# Patient Record
Sex: Female | Born: 1955 | Race: White | Hispanic: No | Marital: Married | Smoking: Current every day smoker
Health system: Southern US, Community
[De-identification: ages and names within clinical notes are randomized; demographics above are authoritative.]

## PROBLEM LIST (undated history)

## (undated) DIAGNOSIS — M79606 Pain in leg, unspecified: Secondary | ICD-10-CM

## (undated) DIAGNOSIS — Z8659 Personal history of other mental and behavioral disorders: Secondary | ICD-10-CM

## (undated) DIAGNOSIS — I739 Peripheral vascular disease, unspecified: Secondary | ICD-10-CM

## (undated) DIAGNOSIS — IMO0002 Reserved for concepts with insufficient information to code with codable children: Secondary | ICD-10-CM

## (undated) DIAGNOSIS — F32A Depression, unspecified: Secondary | ICD-10-CM

## (undated) DIAGNOSIS — M5430 Sciatica, unspecified side: Secondary | ICD-10-CM

## (undated) DIAGNOSIS — M26609 Unspecified temporomandibular joint disorder, unspecified side: Secondary | ICD-10-CM

## (undated) DIAGNOSIS — E039 Hypothyroidism, unspecified: Secondary | ICD-10-CM

## (undated) DIAGNOSIS — E079 Disorder of thyroid, unspecified: Secondary | ICD-10-CM

## (undated) DIAGNOSIS — I639 Cerebral infarction, unspecified: Secondary | ICD-10-CM

## (undated) DIAGNOSIS — J40 Bronchitis, not specified as acute or chronic: Secondary | ICD-10-CM

## (undated) DIAGNOSIS — K219 Gastro-esophageal reflux disease without esophagitis: Secondary | ICD-10-CM

## (undated) DIAGNOSIS — R0600 Dyspnea, unspecified: Secondary | ICD-10-CM

## (undated) DIAGNOSIS — I10 Essential (primary) hypertension: Secondary | ICD-10-CM

## (undated) DIAGNOSIS — F329 Major depressive disorder, single episode, unspecified: Secondary | ICD-10-CM

## (undated) DIAGNOSIS — M797 Fibromyalgia: Secondary | ICD-10-CM

## (undated) HISTORY — DX: Personal history of other mental and behavioral disorders: Z86.59

## (undated) HISTORY — DX: Peripheral vascular disease, unspecified: I73.9

## (undated) HISTORY — PX: OTHER SURGICAL HISTORY: SHX169

## (undated) HISTORY — DX: Reserved for concepts with insufficient information to code with codable children: IMO0002

## (undated) HISTORY — PX: ABDOMINAL HYSTERECTOMY: SHX81

## (undated) HISTORY — DX: Unspecified temporomandibular joint disorder, unspecified side: M26.609

## (undated) HISTORY — PX: OVARIAN CYST SURGERY: SHX726

## (undated) HISTORY — PX: TUBAL LIGATION: SHX77

## (undated) HISTORY — DX: Sciatica, unspecified side: M54.30

## (undated) HISTORY — DX: Pain in leg, unspecified: M79.606

## (undated) HISTORY — DX: Bronchitis, not specified as acute or chronic: J40

## (undated) HISTORY — PX: MANDIBLE FRACTURE SURGERY: SHX706

## (undated) HISTORY — DX: Disorder of thyroid, unspecified: E07.9

## (undated) HISTORY — DX: Fibromyalgia: M79.7

## (undated) HISTORY — DX: Essential (primary) hypertension: I10

---

## 1980-10-24 HISTORY — PX: COLON SURGERY: SHX602

## 1991-08-24 DIAGNOSIS — M26609 Unspecified temporomandibular joint disorder, unspecified side: Secondary | ICD-10-CM

## 1991-08-24 HISTORY — DX: Unspecified temporomandibular joint disorder, unspecified side: M26.609

## 1997-01-22 HISTORY — PX: CHOLECYSTECTOMY: SHX55

## 2000-01-31 ENCOUNTER — Encounter: Payer: Self-pay | Admitting: Physical Medicine and Rehabilitation

## 2000-01-31 ENCOUNTER — Ambulatory Visit (HOSPITAL_COMMUNITY)
Admission: RE | Admit: 2000-01-31 | Discharge: 2000-01-31 | Payer: Self-pay | Admitting: Physical Medicine and Rehabilitation

## 2000-02-29 ENCOUNTER — Ambulatory Visit (HOSPITAL_COMMUNITY)
Admission: RE | Admit: 2000-02-29 | Discharge: 2000-02-29 | Payer: Self-pay | Admitting: Physical Medicine and Rehabilitation

## 2000-02-29 ENCOUNTER — Encounter: Payer: Self-pay | Admitting: Physical Medicine and Rehabilitation

## 2000-03-08 ENCOUNTER — Encounter: Admission: RE | Admit: 2000-03-08 | Discharge: 2000-03-08 | Payer: Self-pay | Admitting: Oral Surgery

## 2000-03-08 ENCOUNTER — Encounter: Payer: Self-pay | Admitting: Oral Surgery

## 2000-03-09 ENCOUNTER — Ambulatory Visit (HOSPITAL_BASED_OUTPATIENT_CLINIC_OR_DEPARTMENT_OTHER): Admission: RE | Admit: 2000-03-09 | Discharge: 2000-03-09 | Payer: Self-pay | Admitting: Oral Surgery

## 2001-06-22 ENCOUNTER — Ambulatory Visit (HOSPITAL_COMMUNITY): Admission: RE | Admit: 2001-06-22 | Discharge: 2001-06-22 | Payer: Self-pay | Admitting: Internal Medicine

## 2005-11-04 ENCOUNTER — Ambulatory Visit: Payer: Self-pay | Admitting: Internal Medicine

## 2005-11-04 ENCOUNTER — Observation Stay (HOSPITAL_COMMUNITY): Admission: EM | Admit: 2005-11-04 | Discharge: 2005-11-05 | Payer: Self-pay | Admitting: *Deleted

## 2005-11-04 ENCOUNTER — Ambulatory Visit: Payer: Self-pay | Admitting: Cardiology

## 2005-11-18 ENCOUNTER — Ambulatory Visit: Payer: Self-pay | Admitting: Cardiology

## 2005-11-29 ENCOUNTER — Ambulatory Visit: Payer: Self-pay | Admitting: Cardiology

## 2005-12-01 ENCOUNTER — Inpatient Hospital Stay (HOSPITAL_BASED_OUTPATIENT_CLINIC_OR_DEPARTMENT_OTHER): Admission: RE | Admit: 2005-12-01 | Discharge: 2005-12-01 | Payer: Self-pay | Admitting: Cardiology

## 2005-12-01 ENCOUNTER — Ambulatory Visit: Payer: Self-pay | Admitting: Cardiology

## 2005-12-09 ENCOUNTER — Ambulatory Visit: Payer: Self-pay | Admitting: Cardiology

## 2005-12-16 ENCOUNTER — Ambulatory Visit: Admission: RE | Admit: 2005-12-16 | Discharge: 2005-12-16 | Payer: Self-pay | Admitting: *Deleted

## 2005-12-17 ENCOUNTER — Emergency Department (HOSPITAL_COMMUNITY): Admission: EM | Admit: 2005-12-17 | Discharge: 2005-12-17 | Payer: Self-pay | Admitting: Emergency Medicine

## 2005-12-18 ENCOUNTER — Emergency Department (HOSPITAL_COMMUNITY): Admission: EM | Admit: 2005-12-18 | Discharge: 2005-12-18 | Payer: Self-pay | Admitting: Emergency Medicine

## 2005-12-25 ENCOUNTER — Emergency Department (HOSPITAL_COMMUNITY): Admission: EM | Admit: 2005-12-25 | Discharge: 2005-12-25 | Payer: Self-pay | Admitting: Emergency Medicine

## 2006-11-26 ENCOUNTER — Emergency Department (HOSPITAL_COMMUNITY): Admission: EM | Admit: 2006-11-26 | Discharge: 2006-11-26 | Payer: Self-pay | Admitting: Emergency Medicine

## 2007-11-19 ENCOUNTER — Emergency Department (HOSPITAL_COMMUNITY): Admission: EM | Admit: 2007-11-19 | Discharge: 2007-11-19 | Payer: Self-pay | Admitting: Emergency Medicine

## 2007-12-17 ENCOUNTER — Emergency Department (HOSPITAL_COMMUNITY): Admission: EM | Admit: 2007-12-17 | Discharge: 2007-12-17 | Payer: Self-pay | Admitting: Emergency Medicine

## 2008-03-05 ENCOUNTER — Emergency Department (HOSPITAL_COMMUNITY): Admission: EM | Admit: 2008-03-05 | Discharge: 2008-03-05 | Payer: Self-pay | Admitting: Emergency Medicine

## 2008-04-19 ENCOUNTER — Emergency Department (HOSPITAL_COMMUNITY): Admission: EM | Admit: 2008-04-19 | Discharge: 2008-04-19 | Payer: Self-pay | Admitting: Emergency Medicine

## 2008-05-02 ENCOUNTER — Emergency Department (HOSPITAL_COMMUNITY): Admission: EM | Admit: 2008-05-02 | Discharge: 2008-05-02 | Payer: Self-pay | Admitting: Emergency Medicine

## 2008-10-24 HISTORY — PX: COLONOSCOPY WITH ESOPHAGOGASTRODUODENOSCOPY (EGD): SHX5779

## 2008-12-05 ENCOUNTER — Emergency Department (HOSPITAL_COMMUNITY): Admission: EM | Admit: 2008-12-05 | Discharge: 2008-12-05 | Payer: Self-pay | Admitting: Emergency Medicine

## 2008-12-06 ENCOUNTER — Emergency Department (HOSPITAL_COMMUNITY): Admission: EM | Admit: 2008-12-06 | Discharge: 2008-12-07 | Payer: Self-pay | Admitting: Emergency Medicine

## 2009-02-09 ENCOUNTER — Emergency Department (HOSPITAL_COMMUNITY): Admission: EM | Admit: 2009-02-09 | Discharge: 2009-02-09 | Payer: Self-pay | Admitting: Emergency Medicine

## 2009-02-11 ENCOUNTER — Emergency Department (HOSPITAL_COMMUNITY): Admission: EM | Admit: 2009-02-11 | Discharge: 2009-02-11 | Payer: Self-pay | Admitting: Emergency Medicine

## 2009-06-06 ENCOUNTER — Emergency Department (HOSPITAL_COMMUNITY): Admission: EM | Admit: 2009-06-06 | Discharge: 2009-06-07 | Payer: Self-pay | Admitting: Emergency Medicine

## 2009-06-20 ENCOUNTER — Emergency Department (HOSPITAL_COMMUNITY): Admission: EM | Admit: 2009-06-20 | Discharge: 2009-06-20 | Payer: Self-pay | Admitting: Emergency Medicine

## 2009-07-01 DIAGNOSIS — Z8719 Personal history of other diseases of the digestive system: Secondary | ICD-10-CM | POA: Insufficient documentation

## 2009-07-02 ENCOUNTER — Telehealth (INDEPENDENT_AMBULATORY_CARE_PROVIDER_SITE_OTHER): Payer: Self-pay

## 2009-07-02 ENCOUNTER — Ambulatory Visit: Payer: Self-pay | Admitting: Internal Medicine

## 2009-07-02 DIAGNOSIS — R109 Unspecified abdominal pain: Secondary | ICD-10-CM | POA: Insufficient documentation

## 2009-07-02 DIAGNOSIS — R634 Abnormal weight loss: Secondary | ICD-10-CM

## 2009-07-03 ENCOUNTER — Encounter: Payer: Self-pay | Admitting: Internal Medicine

## 2009-07-03 ENCOUNTER — Telehealth (INDEPENDENT_AMBULATORY_CARE_PROVIDER_SITE_OTHER): Payer: Self-pay | Admitting: *Deleted

## 2009-07-06 ENCOUNTER — Ambulatory Visit (HOSPITAL_COMMUNITY): Admission: RE | Admit: 2009-07-06 | Discharge: 2009-07-06 | Payer: Self-pay | Admitting: Internal Medicine

## 2009-07-08 ENCOUNTER — Telehealth: Payer: Self-pay | Admitting: Gastroenterology

## 2009-07-09 ENCOUNTER — Encounter (INDEPENDENT_AMBULATORY_CARE_PROVIDER_SITE_OTHER): Payer: Self-pay | Admitting: *Deleted

## 2009-07-13 ENCOUNTER — Encounter: Payer: Self-pay | Admitting: Internal Medicine

## 2009-07-23 ENCOUNTER — Encounter: Payer: Self-pay | Admitting: Internal Medicine

## 2009-07-30 ENCOUNTER — Encounter: Payer: Self-pay | Admitting: Internal Medicine

## 2009-07-30 ENCOUNTER — Ambulatory Visit (HOSPITAL_COMMUNITY): Admission: RE | Admit: 2009-07-30 | Discharge: 2009-07-30 | Payer: Self-pay | Admitting: Internal Medicine

## 2009-07-30 ENCOUNTER — Ambulatory Visit: Payer: Self-pay | Admitting: Internal Medicine

## 2009-08-01 ENCOUNTER — Emergency Department (HOSPITAL_COMMUNITY): Admission: EM | Admit: 2009-08-01 | Discharge: 2009-08-01 | Payer: Self-pay | Admitting: Emergency Medicine

## 2009-08-02 ENCOUNTER — Encounter: Payer: Self-pay | Admitting: Internal Medicine

## 2009-10-26 ENCOUNTER — Ambulatory Visit: Payer: Self-pay | Admitting: Surgery

## 2009-10-28 ENCOUNTER — Ambulatory Visit (HOSPITAL_COMMUNITY): Admission: RE | Admit: 2009-10-28 | Discharge: 2009-10-28 | Payer: Self-pay | Admitting: Surgery

## 2009-10-28 ENCOUNTER — Ambulatory Visit: Payer: Self-pay | Admitting: Surgery

## 2010-01-01 ENCOUNTER — Emergency Department (HOSPITAL_COMMUNITY): Admission: EM | Admit: 2010-01-01 | Discharge: 2010-01-01 | Payer: Self-pay | Admitting: Emergency Medicine

## 2010-11-25 ENCOUNTER — Encounter (HOSPITAL_COMMUNITY): Payer: Self-pay

## 2010-11-25 ENCOUNTER — Emergency Department (HOSPITAL_COMMUNITY)
Admit: 2010-11-25 | Discharge: 2010-11-25 | Disposition: A | Discharge status: Home / Self Care | Payer: BC Managed Care – PPO

## 2010-11-25 ENCOUNTER — Emergency Department (HOSPITAL_COMMUNITY)
Admission: EM | Admit: 2010-11-25 | Discharge: 2010-11-25 | Disposition: A | Discharge status: Home / Self Care | Payer: BC Managed Care – PPO | Attending: Emergency Medicine | Admitting: Emergency Medicine

## 2010-11-25 DIAGNOSIS — R079 Chest pain, unspecified: Secondary | ICD-10-CM | POA: Insufficient documentation

## 2010-11-25 DIAGNOSIS — F17200 Nicotine dependence, unspecified, uncomplicated: Secondary | ICD-10-CM | POA: Insufficient documentation

## 2010-11-25 DIAGNOSIS — I10 Essential (primary) hypertension: Secondary | ICD-10-CM | POA: Insufficient documentation

## 2010-11-25 LAB — COMPREHENSIVE METABOLIC PANEL
ALT: 23 U/L (ref 0–35)
Alkaline Phosphatase: 128 U/L — ABNORMAL HIGH (ref 39–117)
CO2: 24 mEq/L (ref 19–32)
Creatinine, Ser: 0.93 mg/dL (ref 0.4–1.2)
GFR calc Af Amer: >60 mL/min (ref >60)
GFR calc non Af Amer: >60 mL/min (ref >60)
Glucose, Bld: 100 mg/dL — ABNORMAL HIGH (ref 70–99)
Sodium: 137 mEq/L (ref 135–145)
Total Bilirubin: 0.4 mg/dL (ref 0.3–1.2)
Total Protein: 7.4 g/dL (ref 6.0–8.3)

## 2010-11-25 LAB — DIFFERENTIAL
Basophils Absolute: 0 10*3/uL (ref 0.0–0.1)
Basophils Relative: 0 % (ref 0–1)
Eosinophils Absolute: 0.3 10*3/uL (ref 0.0–0.7)
Eosinophils Relative: 3 % (ref 0–5)
Monocytes Absolute: 0.8 10*3/uL (ref 0.1–1.0)
Neutro Abs: 5.5 10*3/uL (ref 1.7–7.7)

## 2010-11-25 LAB — POCT CARDIAC MARKERS
CKMB, poc: <1 ng/mL — ABNORMAL LOW (ref 1.0–8.0)
Myoglobin, poc: 69.5 ng/mL (ref 12–200)
Myoglobin, poc: 53.2 ng/mL (ref 12–200)

## 2010-11-25 LAB — D-DIMER, QUANTITATIVE: D-Dimer, Quant: 0.53 ug/mL-FEU — ABNORMAL HIGH (ref 0.00–0.48)

## 2010-11-25 LAB — CBC: WBC: 9.6 10*3/uL (ref 4.0–10.5)

## 2011-01-08 LAB — POCT I-STAT, CHEM 8
BUN: 13 mg/dL (ref 6–23)
Calcium, Ion: 1.19 mmol/L (ref 1.12–1.32)
Chloride: 104 mEq/L (ref 96–112)
Glucose, Bld: 117 mg/dL — ABNORMAL HIGH (ref 70–99)
Potassium: 4.1 mEq/L (ref 3.5–5.1)

## 2011-01-16 LAB — GC/CHLAMYDIA PROBE AMP, GENITAL
Chlamydia, DNA Probe: NEGATIVE
GC Probe Amp, Genital: NEGATIVE

## 2011-01-16 LAB — BASIC METABOLIC PANEL
CO2: 25 mEq/L (ref 19–32)
Calcium: 9.5 mg/dL (ref 8.4–10.5)
Chloride: 105 mEq/L (ref 96–112)
GFR calc non Af Amer: >60 mL/min (ref >60)
Potassium: 3.8 mEq/L (ref 3.5–5.1)

## 2011-01-16 LAB — DIFFERENTIAL
Basophils Relative: 0 % (ref 0–1)
Eosinophils Absolute: 0.3 10*3/uL (ref 0.0–0.7)
Neutrophils Relative %: 61 % (ref 43–77)

## 2011-01-16 LAB — RPR: RPR Ser Ql: NONREACTIVE

## 2011-01-16 LAB — URINALYSIS, ROUTINE W REFLEX MICROSCOPIC
Bilirubin Urine: NEGATIVE
Nitrite: POSITIVE — AB
Protein, ur: NEGATIVE mg/dL
Specific Gravity, Urine: >1.03 — ABNORMAL HIGH (ref 1.005–1.030)
Urobilinogen, UA: 0.2 mg/dL (ref 0.0–1.0)
pH: 5.5 (ref 5.0–8.0)

## 2011-01-16 LAB — WET PREP, GENITAL
Trich, Wet Prep: NONE SEEN
WBC, Wet Prep HPF POC: NONE SEEN
Yeast Wet Prep HPF POC: NONE SEEN

## 2011-01-16 LAB — CBC
HCT: 36.6 % (ref 36.0–46.0)
MCV: 88.3 fL (ref 78.0–100.0)

## 2011-01-16 LAB — RAPID URINE DRUG SCREEN, HOSP PERFORMED: Amphetamines: NOT DETECTED

## 2011-01-16 LAB — URINE MICROSCOPIC-ADD ON

## 2011-01-24 ENCOUNTER — Other Ambulatory Visit: Payer: Self-pay | Admitting: Neurology

## 2011-01-24 ENCOUNTER — Emergency Department (HOSPITAL_COMMUNITY)
Admission: EM | Admit: 2011-01-24 | Discharge: 2011-01-24 | Disposition: A | Discharge status: Home / Self Care | Payer: BC Managed Care – PPO | Attending: Emergency Medicine | Admitting: Emergency Medicine

## 2011-01-24 DIAGNOSIS — E785 Hyperlipidemia, unspecified: Secondary | ICD-10-CM | POA: Insufficient documentation

## 2011-01-24 DIAGNOSIS — M545 Low back pain: Secondary | ICD-10-CM

## 2011-01-24 DIAGNOSIS — E039 Hypothyroidism, unspecified: Secondary | ICD-10-CM | POA: Insufficient documentation

## 2011-01-24 DIAGNOSIS — M549 Dorsalgia, unspecified: Secondary | ICD-10-CM | POA: Insufficient documentation

## 2011-01-24 DIAGNOSIS — I10 Essential (primary) hypertension: Secondary | ICD-10-CM | POA: Insufficient documentation

## 2011-01-24 DIAGNOSIS — I739 Peripheral vascular disease, unspecified: Secondary | ICD-10-CM | POA: Insufficient documentation

## 2011-01-24 DIAGNOSIS — Z79899 Other long term (current) drug therapy: Secondary | ICD-10-CM | POA: Insufficient documentation

## 2011-01-26 ENCOUNTER — Ambulatory Visit (HOSPITAL_COMMUNITY)
Admission: RE | Admit: 2011-01-26 | Discharge: 2011-01-26 | Disposition: A | Discharge status: Home / Self Care | Payer: BC Managed Care – PPO | Source: Ambulatory Visit | Attending: Neurology | Admitting: Neurology

## 2011-01-26 DIAGNOSIS — M545 Low back pain, unspecified: Secondary | ICD-10-CM | POA: Insufficient documentation

## 2011-01-26 DIAGNOSIS — M79609 Pain in unspecified limb: Secondary | ICD-10-CM | POA: Insufficient documentation

## 2011-01-26 DIAGNOSIS — M5126 Other intervertebral disc displacement, lumbar region: Secondary | ICD-10-CM | POA: Insufficient documentation

## 2011-01-27 LAB — BASIC METABOLIC PANEL
Calcium: 10.3 mg/dL (ref 8.4–10.5)
GFR calc Af Amer: >60 mL/min (ref >60)
GFR calc non Af Amer: >60 mL/min (ref >60)
Glucose, Bld: 95 mg/dL (ref 70–99)
Sodium: 136 mEq/L (ref 135–145)

## 2011-01-27 LAB — COMPREHENSIVE METABOLIC PANEL
Albumin: 3.8 g/dL (ref 3.5–5.2)
Alkaline Phosphatase: 79 U/L (ref 39–117)
BUN: 7 mg/dL (ref 6–23)
Calcium: 9.6 mg/dL (ref 8.4–10.5)
Creatinine, Ser: 0.9 mg/dL (ref 0.4–1.2)
Potassium: 3.6 mEq/L (ref 3.5–5.1)
Total Protein: 7 g/dL (ref 6.0–8.3)

## 2011-01-27 LAB — CBC
HCT: 39 % (ref 36.0–46.0)
Platelets: 179 10*3/uL (ref 150–400)
RDW: 14.5 % (ref 11.5–15.5)

## 2011-01-27 LAB — HEMOGLOBIN AND HEMATOCRIT, BLOOD: HCT: 43.3 % (ref 36.0–46.0)

## 2011-01-27 LAB — URINALYSIS, ROUTINE W REFLEX MICROSCOPIC
Glucose, UA: NEGATIVE mg/dL
Ketones, ur: NEGATIVE mg/dL
Protein, ur: NEGATIVE mg/dL

## 2011-01-27 LAB — DIFFERENTIAL
Lymphocytes Relative: 30 % (ref 12–46)
Lymphs Abs: 3.2 10*3/uL (ref 0.7–4.0)
Monocytes Absolute: 0.7 10*3/uL (ref 0.1–1.0)
Monocytes Relative: 6 % (ref 3–12)
Neutro Abs: 6.5 10*3/uL (ref 1.7–7.7)

## 2011-01-29 LAB — URINALYSIS, ROUTINE W REFLEX MICROSCOPIC
Bilirubin Urine: NEGATIVE
Hgb urine dipstick: NEGATIVE
Ketones, ur: NEGATIVE mg/dL
Nitrite: NEGATIVE
Specific Gravity, Urine: 1.015 (ref 1.005–1.030)
pH: 8 (ref 5.0–8.0)

## 2011-01-29 LAB — DIFFERENTIAL
Basophils Relative: 1 % (ref 0–1)
Eosinophils Absolute: 0.3 10*3/uL (ref 0.0–0.7)
Eosinophils Relative: 2 % (ref 0–5)
Monocytes Relative: 7 % (ref 3–12)
Neutrophils Relative %: 67 % (ref 43–77)

## 2011-01-29 LAB — COMPREHENSIVE METABOLIC PANEL
ALT: 28 U/L (ref 0–35)
AST: 39 U/L — ABNORMAL HIGH (ref 0–37)
Alkaline Phosphatase: 92 U/L (ref 39–117)
CO2: 28 mEq/L (ref 19–32)
GFR calc Af Amer: >60 mL/min (ref >60)
GFR calc non Af Amer: >60 mL/min (ref >60)
Glucose, Bld: 104 mg/dL — ABNORMAL HIGH (ref 70–99)
Potassium: 4.2 mEq/L (ref 3.5–5.1)
Sodium: 134 mEq/L — ABNORMAL LOW (ref 135–145)
Total Protein: 7 g/dL (ref 6.0–8.3)

## 2011-01-29 LAB — CBC
Hemoglobin: 14.5 g/dL (ref 12.0–15.0)
RBC: 4.66 MIL/uL (ref 3.87–5.11)
WBC: 11.2 10*3/uL — ABNORMAL HIGH (ref 4.0–10.5)

## 2011-01-29 LAB — POCT CARDIAC MARKERS
CKMB, poc: <1 ng/mL — ABNORMAL LOW (ref 1.0–8.0)
Troponin i, poc: <0.05 ng/mL (ref 0.00–0.09)

## 2011-01-29 LAB — LACTIC ACID, PLASMA: Lactic Acid, Venous: 1.1 mmol/L (ref 0.5–2.2)

## 2011-02-02 LAB — BASIC METABOLIC PANEL
CO2: 25 mEq/L (ref 19–32)
Chloride: 103 mEq/L (ref 96–112)
Creatinine, Ser: 0.78 mg/dL (ref 0.4–1.2)
GFR calc Af Amer: >60 mL/min (ref >60)
Potassium: 3.7 mEq/L (ref 3.5–5.1)
Sodium: 135 mEq/L (ref 135–145)

## 2011-02-02 LAB — D-DIMER, QUANTITATIVE: D-Dimer, Quant: 0.22 ug/mL-FEU (ref 0.00–0.48)

## 2011-03-08 NOTE — Assessment & Plan Note (Signed)
OFFICE VISIT   TARALYN, FERRAIOLO A  DOB:  09-12-1956                                       10/26/2009  ZOXWR#:[id number]   Ms. Alda Lea in today for recurrence of her right leg claudication.  She has a very significantly complicated past medical history.  However,  her vascular history is consistent with Dr. Madilyn Fireman putting in a right  common iliac stent in February 2007.  This was a Genesis 7 x 29.  For  the past year-and-a-half, she has had pain in and coldness in her right  foot.  She also has buttock claudication at approximately 50 feet.  She  recently had a duplex ultrasound which showed 100% circulation in the  left leg and ABI of 61 on the right.   The patient suffers from chronic pain in her back, legs, and hips.  She  has fibromyalgia and sciatica.  She is hypertensive and has been taking  medicine since October 2010.   FAMILY HISTORY:  Positive for osteoarthritis in her mother as well as  congestive heart failure, diabetes, hypertension, and stroke.   SOCIAL HISTORY:  Positive for tobacco abuse.   REVIEW OF SYSTEMS:  Positive for some occasional chest pain and chest  tightness, shortness of breath with exertion.  PULMONARY:  Positive for  bronchitis and wheezing.  GI:  Positive for constipation.  GU:  Negative.  VASCULAR:  Positive for pain in legs with walking.  NEURO:  Positive for headaches.  MUSCULOSKELETAL:  Positive for arthritis, joint  pain, muscle pain.  PSYCH:  Positive for depression.  ENT:  Negative.  HEM:  Negative.  SKIN:  Negative.   SOCIAL HISTORY:  She is married with 2 children.  Smokes 1-1/2 packs a  day.  Does not drink.   ALLERGIES:  CODEINE.   PHYSICAL EXAMINATION:  Heart rate 97, blood pressure 120/80, temperature  is 97.6.  GENERAL:  Well appearing, in no distress.  HEENT:  Normal.  LUNGS:  Clear bilaterally.  CARDIOVASCULAR:  Regular rate and rhythm.  ABDOMEN:  Soft, nontender, no pulsatile mass.  MUSCULOSKELETAL:   Without major deformities.  NEURO:  She has no focal weaknesses.  SKIN:  Without ulceration.  She has 2+ left femoral pulse and faint right femoral pulse.   ASSESSMENT:  Peripheral vascular disease.   PLAN:  I have discussed proceeding with an arteriogram to evaluate blood  flow to her right leg.  This may require bilateral access.  I will start  on the right side access antegrade and hopefully she has in-stent  stenosis that we can fix from a single approach.  She understands that  this may require an operative repair.  She also understands that this  will most likely not alleviate all of the symptoms in her legs.  I have  scheduled an arteriogram for this Wednesday, January 5th.     Jorge Ny, MD  Electronically Signed   VWB/MEDQ  D:  10/26/2009  T:  10/27/2009  Job:  [id number]   cc:   Kirstie Peri, MD

## 2011-03-11 NOTE — Op Note (Signed)
NAME:  Ashlee Snow, Ashlee Snow                 ACCOUNT NO.:  [id number]   MEDICAL RECORD NO.:  [id number]          PATIENT TYPE:  AMB   LOCATION:  DFTL                         FACILITY:  [facility]   PHYSICIAN:  Balinda Quails, M.D.    DATE OF BIRTH:  October 24, 1956   DATE OF PROCEDURE:  12/16/2005  DATE OF DISCHARGE:                                 OPERATIVE REPORT   PHYSICIAN:  Denman George, MD.   DIAGNOSIS:  Right lower extremity claudication.   PROCEDURES:  1.  Abdominal aortogram with bilateral lower extremity runoff arteriography.  2.  Right common iliac percutaneous transluminal angioplasty/stent.   ACCESS:  Right common femoral artery, 7-French sheath.   CONTRAST:  165 mL Visipaque.   COMPLICATIONS:  None apparent.   CLINICAL NOTE:  Ashlee Snow is a 55 year old female with history of heavy  tobacco use, seen in the office with symptoms of right lower extremity  claudication.  She was noted to have two distinct complaints of pain in her  right leg.  She clearly had claudication-type symptoms associated with  exertion and relief with rest.  She also complained of some constant pain in  the right leg and also coldness of her left foot.  It was made in clear to  the patient that in all likelihood that there were multiple causes to her  pain.  She did clearly have, however, a component of claudication and was  brought to the catheterization lab at this time for diagnostic arteriography  and possible intervention.   PROCEDURE NOTE:  The patient brought to the catheterization lab in stable  condition.  Placed in supine position.  Both groins prepped and draped in a  sterile fashion.  She received 2 mg of Versed, 75 mcg of fentanyl  intravenously.  Skin and  subcutaneous tissue in the right groin instilled  with 1% Xylocaine.  A needle was easily introduced in the right common  femoral artery.  A 0.035 Magic torque guidewire was advanced through the  needle into the midabdominal aorta under  fluoroscopy.  The needle removed, a  5-French sheath advanced over the guidewire, and the dilator removed and the  sheath flushed with heparin saline solution.   A standard pigtail catheter was then advanced over the guidewire to the  midabdominal aorta.  Standard AP midabdominal aortogram obtained.  This  revealed single widely patent bilateral renal arteries.  The infrarenal  aorta was normal in caliber.  There was a plaque along the right lateral  wall of the terminal infrarenal aorta extending down to involve the  bifurcation.  There was a high-grade stenosis of the proximal right common  iliac artery.  This resulted in an approximately 2 cm tubular stenosis  estimated to be 80%.  The left common iliac artery origin was widely patent.  The left hypogastric and external iliac arteries were widely patent.  The  distal right common iliac artery resumed normal caliber and provided flow  into patent right hypogastric and external iliac arteries.   The pigtail catheter brought down the aortic bifurcation and lower extremity  runoff  arteriography obtained bilaterally.  The left leg revealed widely  patent common femoral artery.  The left profunda femoris and superficial  femoral arteries were widely patent.  There was normal flow to the popliteal  level in the left leg.  The left calf revealed three-vessel arterial runoff.   The right leg revealed normal-caliber external iliac artery.  The right  common femoral artery was normal.  The right profunda and superficial  femoral arteries were widely patent.  The right popliteal artery was intact  without stenosis.  Although flow was slow due to proximal stenosis, the  right tibial arteries appeared widely patent.     A standard AP pelvic arteriogram was then obtained.  This further delineated  the high-grade stenosis of the proximal right common iliac artery.   The guidewire was then reinserted and the pigtail catheter removed.  A  sheath  exchange then made for a long 7-French sheath.  The patient  administered 4000 units heparin intravenously.  ACT measured at 306 seconds.   The area of stenosis was further delineated with retrograde injection  through the femoral sheath and a Glow and Tell tape.  A 7/29 Genesis stent  on an Opta balloon was then advanced over the guidewire positioned at the  area of stenosis of right common iliac artery.  This was verified with  retrograde injection through the sheath.  This was inflated at 8 atmospheres  x30 seconds.   Following stent deployment, a completion arteriogram obtained.  This  verified good position of the stent.  There was minimal residual stenosis.  There was no evidence of dissection or other complication.   A retrograde injection then made through the sheath with oblique view of the  right common femoral artery.  This revealed a widely patent right common  femoral artery with normal flow through the profunda and the proximal  superficial femoral artery.   The sheath was then drawn back into the external iliac artery.  The patient  transferred to the holding area for planned removal of sheath when ACT  appropriate.   FINAL IMPRESSION:  1.  Widely patent single bilateral renal arteries.  2.  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  3.  Successful angioplasty and stenting, right common iliac artery stenosis.  4.  Normal infrainguinal runoff bilaterally.   DISPOSITION:  These results been reviewed with the patient and family.  The  patient will remain on an aspirin daily.  Advised to discontinue tobacco  use.  Follow up in the office in three to four weeks.      Balinda Quails, M.D.  Electronically Signed    PGH/MEDQ  D:  12/16/2005  T:  12/16/2005  Job:  [id number]   cc:   [facility]   Kirstie Peri, MD  Fax: [phone]

## 2011-03-11 NOTE — Discharge Summary (Signed)
Ashlee Snow, Ashlee Snow                 ACCOUNT NO.:  [id number]   MEDICAL RECORD NO.:  [id number]          PATIENT TYPE:  OBV   LOCATION:  2629                         FACILITY:  [facility]   PHYSICIAN:  Ileana Roup, M.D.  DATE OF BIRTH:  April 03, 1956   DATE OF ADMISSION:  11/04/2005  DATE OF DISCHARGE:  11/05/2005                                 DISCHARGE SUMMARY   DISCHARGE DIAGNOSES:  1.  Noncardiac chest pain.  2.  History of hypothyroidism.  3.  History of back pain.  4.  History of fibromyalgia.  5.  History of right hip thigh cath, intermittent claudication.  6.  Tobacco abuse.  7.  History of bilateral oophorectomy (July 1998).  8.  History of vaginal hysterectomy (July 1985).  9.  Status post tubal ligation (February 1980).  10. Status post TMJ surgery (October 1992).  11. History of dyslipidemia (LDL 163).  12. Status post cholecystectomy (April 1998).   DISCHARGE MEDICATIONS:  1.  Levothyroxine 75 mcg p.o. q. day.  2.  OxyContin 20 mg p.o. b.i.d.  3.  Methocarbamol 750 mg p.o. b.i.d.  4.  Premarin 1.25 mg p.o. q. a.m.  5.  Aspirin 81 mg p.o. q. day.   CONDITION ON DISCHARGE:  Ashlee Snow was admitted with symptoms of atypical  chest pain. She was evaluated with a 23 hour observation at which time  cardiac enzymes were cycled and serial EKG's were obtained.  A cardiology  consultation was obtained due to her significant risk factor of right lower  extremity claudication.  Subsequently recommendations for follow up with Dr.  Diona Browner in [place], [place].  The patient will be contacted for  appointment time and at the time of cardiology follow up a fasting lipid  profile will be obtained.   CONSULTATIONS:  Lorain Childes, M.D. [facility].   ADMITTING HISTORY AND PHYSICAL:  CHIEF COMPLAINTS:  Chest pain.   HISTORY OF PRESENT ILLNESS:  Ashlee Snow is a 55 year old white female with  past medical history and tobacco abuse and chronic pain who presents with a  12 hour history  of chest pain prior to admission.  Pain occurred suddenly at  3:30 a.m. on the day of admission waking the patient with a band-like  feeling around her chest, worsened with inspiration.  The patient was  relieved with direct pressure by her husband which was a 6-7/10 in  intensity.  She complained of mild shortness of breath and denied dyspnea on  exertion, palpitations, PND, syncope, no nausea, vomiting or diarrhea.  She  did note also some suprapubic pressure with urination.  No dysuria.  Cardiac  risk factors are tobacco abuse, dyslipidemia.  She is not obese.  She denies  cardiac family history.   ALLERGIES:  Patient reports being allergic to CODEINE.   HOME MEDICATIONS:  1.  Levothyroxine 75 mcg p.o. q. a.m.  2.  OxyContin 20 mg p.o. b.i.d.  3.  Methocarbamol 750 mg p.o. b.i.d.  4.  Premarin 1.25 mg p.o. q. a.m.   SUBSTANCE ABUSE HISTORY:  The patient is a current smoker smoking  greater  than 1-1/2 packs per day x 30 years.  She denies alcohol, cocaine, or other  illicit drug use.   SOCIAL HISTORY:  The patient is married since 36.  Her high school  education obtained was in 12th grade.  She is currently unemployed. She is  insured with [employer], [place] and lives in [place] with her  husband and granddaughter.   FAMILY MEDICAL HISTORY:  Mother died at 55 of cancer.  Father is 57 years  old. He has had a stroke and has peripheral arterial disease.  Sister is 34  years old status post bilateral hip replacement. She has 2 children, one  with scoliosis.   REVIEW OF SYSTEMS:  Positive as per HPI and below.  RESPIRATORY: Productive  cough.   VITAL SIGNS: Temperature 97.0, blood pressure 155/91, heart rate 90,  respirations 16.  Oxygen saturation 100% on room air.   PHYSICAL EXAMINATION:  GENERAL:  No acute distress.  HEENT:  Pupils equal, round and reactive to light.  Extraocular movements  are intact, anicteric.  Mucous membranes are pink and moist.  No  cervical or  tonsillar lymphadenopathy.  Supple neck without JVD.  No thyromegaly.  No  carotid bruits.  PULMONARY:  Breath sounds clear to auscultation bilaterally.  Equal  excursions bilaterally.  CVS:  S1, S2 without murmurs, rubs or gallops.  GI:  Soft, nontender, nondistended.  Active bowel sounds.  No masses.  No  hepatosplenomegaly.  EXTREMITIES:  No clubbing, edema or cyanosis.  No calf tenderness.  SKIN:  Warm, dry and intact.  LYMPH:  No edema.  MUSCULOSKELETAL:  Moves all extremities well.  NEUROLOGICAL:  Alert and oriented x 3.  No gross focal deficits.  PSYCH:  Euthymic with congruent affect.   LABORATORIES:  Sodium 134, potassium 3.7, chloride 105, bicarb 20, BUN 8,  creatinine 1.1, glucose 134.  White blood cells 14.3.  Hemoglobin 13.9.  Platelets 212.  ANC 10.3.  MCV 86.8.  Calcium 8.9.  PT 12.6, INR .9, D-dimer  0.25.  Myoglobin 64.7, CK-MB less than 1.  Troponin less than 0.05.  Chest x-  ray no acute findings.   HOSPITAL COURSE:  1.  Atypical chest pain.  Ashlee Snow presented with chest pain atypical for      cardiac presentation due to her significant risk with tobacco abuse,      dyslipidemia and right lower extremity claudication.  Cardiology      consultation was obtained and per the recommendations, Ashlee Snow will      have an outpatient stress test.  Will follow up with Dr. Diona Browner in      [place].  Serial EKG's were obtained during the hospital stay and were      normal sinus rhythm.  Cardiac enzymes were followed and are as follows      on January 12:  Set 1:  Myoglobin 59, CK-MB less than 1.0, troponin-I less than 0.05.  Set 2:  Myoglobin 49, CK-MB 1.1, troponin less than 0.05.  Set 3:  CK 49.  CK-MB 0.6.  Troponin-I less than 0.01.  1.  Suprapubic pressure.  Ashlee Snow complains of suprapubic pressure with      urination.  This is evaluated with urinalysis and her culture which were      within normal limits.  No treatment was indicated and this resolved      during hospital stay.  2.  Hypothyroidism.  Describes a history of hypothyroidism.  TSH was  obtained (1.96) her home dose of levothyroxine was continued.  3.  Chronic pain.  The patient's home methocarbamol and OxyContin were      continued.  She had no complaints of pain during hospital stay.  4.  Dyslipidemia.  Ashlee Snow has a history of dyslipidemia with an LDL of      163 on a fasting lipid profile obtained in December 2006.  It is highly      recommended that she be placed on a Statin for this.  However per her      PCP Dr. Baruch Goldmann, initial diet modification was to be tried.  5.  Status post oophorectomy.  Hormones are continued for her home regimen.  6.  Tobacco abuse.  Nicoderm patch was started during her hospital stay and      tobacco cessation consult was provided.      Sharin Mons, M.D.    ______________________________  Ileana Roup, M.D.    WC/MEDQ  D:  11/07/2005  T:  11/07/2005  Job:  [id number]   cc:   Jonelle Sidle, M.D. [facility]  [address]  [place]  [place] [address]   Lorain Childes, M.D. [facility]  [address]  [place]  [place] [address]   Baruch Goldmann, M.D.  [place], [place]

## 2011-03-11 NOTE — Consult Note (Signed)
Ashlee Snow, Ashlee Snow                 ACCOUNT NO.:  [id number]   MEDICAL RECORD NO.:  [id number]          PATIENT TYPE:  OBV   LOCATION:  2629                         FACILITY:  [facility]   PHYSICIAN:  Lorain Childes, M.D. LHCDATE OF BIRTH:  02-24-1956   DATE OF CONSULTATION:  11/05/2005  DATE OF DISCHARGE:                                   CONSULTATION   DATE OF CONSULTATION:  November 05, 2005   PRIMARY CARE PHYSICIAN:  Kirstie Peri, M.D.   REFERRING PHYSICIAN:  Sharin Mons, M.D.  Duncan Dull, M.D.   CHIEF COMPLAINT:  Chest pain.   HISTORY OF PRESENT ILLNESS:  The patient is a 55 year old female with known  coronary artery disease who presents for evaluation of a band-like chest  pain beginning around 3:30 a.m.  The patient reports that she has pain  beginning yesterday morning which was sharp and stabbing in nature.  It was  nonradiating and not associated was nausea, vomiting, or palpitations.  No  syncope.  She reports minimal shortness of breath, but significant coughing.  She reports that the pain is mostly noted when she is coughing.  She reports  that she has had a cold recently and was prescribed cough medicine.  She  also had a cold on October 20, 2005 and was given a prescription for cough  medicine by her primary care physician.  Her symptoms have improved and then  recurred over the past few days.  She also reports that she has a sick  granddaughter at home with a cough and has had been given cold medicine too.  With her pain which began yesterday morning, she came to the emergency room  for further evaluation in the ER.  She was given sublingual nitroglycerin  with no improvement of her pain.  Her pain eventually resolved on its own.  She did take OxyContin at home for which she uses for back discomfort, which  she thinks overall improved her pain.  In the ER, an EKG was done which did  not reveal any acute changes.  Cardiac enzymes were checked and were  negative.   She was admitted to the medicine teaching service for further  evaluation and management for atypical chest pain.  Over the past day, her  cardiac enzymes have been cycled and they remain negative.  She reports that  she has had minimal discomfort.  She has been up and ambulating and denies  any worsening of her pain with exertion.   PAST MEDICAL HISTORY:  1.  Tobacco abuse; she has an approximately 40-pack-year history, ongoing to      one and a one and a half packs per day.  2.  Dyslipidemia with an LDL in the 160s per her primary care visit.  3.  Claudication affecting her right lower extremity.  She is supposed to      see a Careers adviser on January 25 for further evaluation.  4.  Chronic back pain with lumbar disk disease for which she is on chronic      narcotics.  5.  Fibromyalgia.  6.  Status post TAH.  7.  Status post bilateral oophorectomy.  8.  Status post TMJ surgery.  9.  Status post cholecystectomy.   SOCIAL HISTORY:  She lives in [place] with her husband. She also has  custody of her granddaughter.  She does not work.  She has a 40-pack-year  history which is ongoing.  She denies any alcohol or drugs.   MEDICATIONS:  1.  Levothyroxine 75 mcg p.o. daily.  2.  Oxycodone ER 20 mg p.o. b.i.d.  3.  Methocarbamol 750 mg p.o. b.i.d.  4.  Premarin 1.25 mg daily.   ALLERGIES:  CODEINE which causes itching.   FAMILY HISTORY:  Her mother died at the age of 72 from cancer.  Father is  alive at the age of 15 and had a stroke, and also has PAD.  None of her  siblings have early coronary disease.   REVIEW OF SYSTEMS:  She denies any fevers, chills, or night sweats, or  weight changes.  She reports occasional headache and occasional blurred  vision which has been going on for the past year.  She reports chest pain as  reported in the HPI.  She denies dyspnea on exertion or orthopnea, no PND.  No lower extremity edema.  She reports cough as stated above with minimal  sputum  production which is clear.  She reports urinary symptoms of frequency  and pelvic fracture.  She denies any hematuria.  She denies any focal  weakness or numbness.  She denies any nausea, vomiting.  No diarrhea.  No  bright red blood per rectum.  No melena.  All other systems are negative.   PHYSICAL EXAMINATION:  VITAL SIGNS:  Temperature 97, pulse 80, respirations  14, blood pressure 115/90, she is saturating 98% on room air.  GENERAL:  She is a pleasant female sitting comfortably on the bed in no  acute distress.  HEENT:  Head is normocephalic, atraumatic.  Oropharynx clear.  NECK:  Supple.  JVP is approximately 6 cm.  She has no carotid bruits.  CARDIOVASCULAR:  Normal S1 and split S2.  Regular rate and rhythm.  She has  no murmur appreciated.  Pulses are 2+ and equal throughout.  No bruits.  LUNGS:  Clear to auscultation bilaterally.  ABDOMEN:  Soft and nontender with normoactive bowel sounds.  EXTREMITIES:  No edema.  She has intact distal pulses, symmetric  bilaterally.  NEUROLOGIC:  She is alert and oriented x3.  Cranial nerves are grossly  intact.  Strength is appropriate.   RADIOLOGIC STUDIES:  Chest x-ray shows no acute air space disease.  EKG  shows rate of 77, sinus rhythm, normal axis, normal intervals, no acute  ischemic changes, no hypertrophy.   LABORATORY DATA:  White count of 8, hematocrit of 40.3, and platelet count  of 212.  Her creatinine is 1.1, potassium is 3.7.  Cardiac enzymes are  negative with troponin less than 0.01 on three checks.   ASSESSMENT AND PLAN:  The patient is a pleasant 55 year old female who was  admitted with atypical chest pain.   Chest pain.  The patient's chest pain sounds noncardiac in nature and  associated with her upper respiratory infection symptoms and cough.  She  does have cardiac risk factors and is being evaluated for peripheral vascular disease surgery.  We would recommend her having a stress test for  further evaluation. We  can do this as an outpatient and  she can follow up with [place] in our [place] [facility].  For primary prevention I  recommend aspirin and checking her lipids and treating them if they are  elevated. We will follow up on her laboratory data.  Thank you for this  consultation.  If you have any questions, please feel free to contact us.           ______________________________  Lorain Childes, M.D. [facility]     CGF/MEDQ  D:  11/05/2005  T:  11/07/2005  Job:  [id number]   cc:   Duncan Dull, M.D.  Fax: [phone]

## 2011-03-11 NOTE — Cardiovascular Report (Signed)
NAMECAMYAH, Ashlee Snow                 ACCOUNT NO.:  [id number]   MEDICAL RECORD NO.:  [id number]          PATIENT TYPE:  OIB   LOCATION:  1963                         FACILITY:  [facility]   PHYSICIAN:  Rollene Rotunda, M.D.   DATE OF BIRTH:  1956/04/01   DATE OF PROCEDURE:  12/01/2005  DATE OF DISCHARGE:                              CARDIAC CATHETERIZATION   PRIMARY CARE PHYSICIAN:  Dr. Sherryll Burger.   CARDIOLOGIST:  Dr. Diona Browner.   PROCEDURE:  Left heart catheterization/coronary arteriography.   INDICATIONS FOR PROCEDURE:  Evaluate patient with an abnormal Cardiolite,  suggesting ischemia in the anterior wall. The ejection fraction was 60%.   DESCRIPTION OF PROCEDURE:  Left heart catheterization was performed via the  left femoral artery. I chose this because of reduced pulse on the right and  claudication. The vessel was cannulated using an anterior wall puncture. A  #4 French arterial sheath was inserted via the modified Seldinger technique.  Pre-form Judkins and a pigtail catheter were utilized. Distal abdominal  aortogram was obtained because of her claudication. The patient tolerated  the procedure well and left the lab in stable condition.   RESULTS:  1.  HEMODYNAMICS:  LV 141/12, AO 149/75.  2.  CORONARIES:  The left main was short and normal. There was essentially      separate ostia. The LAD wrapped the apex. It was a somewhat narrow      caliber vessel. There was a 25% proximal plaque. There were first and      second diagonals, which were small and normal. The circumflex was      relatively a large vessel. In the AV groove, there was a mid 25%      stenosis. The first diagonal was very small and normal. The second      diagonal was moderate size and normal. The third and fourth diagonals      were small and normal. There was a large posterolateral, which was      normal. The right coronary artery was a dominant, though not      particularly large vessel. It was normal throughout  its course. There      was a PDA, which was small to moderate size and normal.  3.  LEFT VENTRICULOGRAM:  The left ventriculogram was obtained in the RAO      projection. The EF was approximately 60% with no regional wall motion      abnormalities.  4.  ABDOMINAL AORTOGRAM:  An abdominal aortogram was obtained. It      demonstrated patent renals. There was no significant aortic disease.      However, there was a sub-total stenosis of the proximal right iliac,      which was a rather discrete lesion. The left iliac was free of high-      grade disease.   CONCLUSION:  1.  Mild coronary plaque.  2.  Severe right iliac stenosis.  3.  Well preserved ejection fraction.   PLAN:  No further cardiac workup is suggested. I have discussed this with  Dr. Sherryll Burger, who will follow her  for evaluation of non-anginal chest pain. I  also called Dr. Madilyn Fireman, who was due to see her for her peripheral vascular  disease. He will set up the next step in treating this.           ______________________________  Rollene Rotunda, M.D.     JH/MEDQ  D:  12/01/2005  T:  12/01/2005  Job:  [id number]   cc:   Kirstie Peri, MD  Fax: [phone]   [facility]   Balinda Quails, M.D.  [address]  [place]  [place] [address]

## 2011-03-11 NOTE — Op Note (Signed)
[place]  Patient:    ATARA, PATERSON                        MRN: [id number] Proc. Date: 03/09/00 Adm. Date:  [id number] Disc. Date: [id number] Attending:  Retia Passe Dictator:   Vania Rea. Warren Danes, D.D.S.                           Operative Report  PREOPERATIVE DIAGNOSES:  Maxillary and mandibular non-restorable teeth, numbers 5, 6, 7, 8, 9, 20 through 29, and associated advanced periodontal disease and dental caries.  POSTOPERATIVE DIAGNOSES:  Maxillary and mandibular non-restorable teeth, numbers 5, 6, 7, 8, 9, 20 through 29, and associated advanced periodontal disease and dental caries.  SURGERY:  Removal of the above teeth, maxillary and mandibular alveoloplasties.  SURGEON:  Dr. Warren Danes.  FIRST ASSISTANT:  Fletcher.  ANESTHESIA:  General via nasal endotracheal intubation.  ESTIMATED BLOOD LOSS:  Less than 50 cc.  FLUID REPLACEMENT:  Approximately 1000 cc of crystalloid solution.  COMPLICATIONS:  None apparent.  INDICATIONS FOR PROCEDURE:  Ms. Provencher is a 55 year old female who was referred to my office for removal of the remaining dentition.  The patient has suffered from chronic dental caries and periodontal disease.  She presents with a history of asthma and panic attacks, and due to her medical history, the procedure was scheduled under operating room conditions where the airway could be maintained.  DESCRIPTION OF PROCEDURE:  On 03/09/00, Ms. Dermody was taken to [facility] where she was placed on the operating room table in a supine position.  Following successful nasoendotracheal intubation and general anesthesia, the face, oral cavity, and neck were prepped and draped in the usual sterile operating room fashion.  The hypopharynx was suctioned free of fluids and secretions, and a moistened 2 inch vaginal pack was placed as a throat pack.  Attention was directed intraorally, where  approximately 15 cc of 0.50% Xylocaine containing 1:200,000 epinephrine was infiltrated in the maxillary buccal and palatal soft tissues, and the left and right inferior alveolar neurovascular regions.  A #9 periosteal elevator was then used to reflect a full thickness mucoperiosteal flap around the maxillary and mandibular dentition.  The teeth were then subluxated from the alveolus using an 11A elevator, and the maxillary teeth were then removed using a #1 forcep, all other mandibular teeth removed using a 151 dental forcep.  The bony margins were exposed using the #9 ______ periosteal elevator.  Alveoloplasties were then performed using a Striker rotary osteotome and a #8 round burr.  The areas were then thoroughly irrigated with sterile saline and irrigating solutions and suctioned.  Final contouring was performed with a small osseous file.  The mucoperiosteal margins were then approximated and sutured in a continuous interlocking fashion using 4-0 chromic suture material on a PS2 needle.  The throat pack was removed, the hypopharynx suctioned free of fluids and secretions.  Ms. Farina was allowed to awaken from the anesthesia, and taken to the recovery room where she tolerated the procedure well and without apparent complications. DD:  03/09/00 TD:  03/14/00 Job: [id number]

## 2011-05-23 ENCOUNTER — Ambulatory Visit (INDEPENDENT_AMBULATORY_CARE_PROVIDER_SITE_OTHER): Payer: Medicaid Other | Admitting: Surgery

## 2011-05-23 ENCOUNTER — Encounter (INDEPENDENT_AMBULATORY_CARE_PROVIDER_SITE_OTHER): Payer: Medicaid Other

## 2011-05-23 DIAGNOSIS — I70219 Atherosclerosis of native arteries of extremities with intermittent claudication, unspecified extremity: Secondary | ICD-10-CM

## 2011-05-23 DIAGNOSIS — I739 Peripheral vascular disease, unspecified: Secondary | ICD-10-CM

## 2011-05-23 NOTE — Procedures (Unsigned)
LOWER EXTREMITY ARTERIAL DUPLEX  INDICATION:  Peripheral vascular disease.  HISTORY: Diabetes:  Unknown. Cardiac:  Unknown. Hypertension:  Yes. Smoking:  Currently. Previous Surgery:  Right common iliac artery stent with PTA 12/16/2005; right common iliac artery/right external iliac artery stent with PTA on 10/28/2009.  SINGLE LEVEL ARTERIAL EXAM                     RIGHT             LEFT Brachial:           128               116 Anterior tibial:    98                134 Posterior tibial:   103               128 Peroneal: Ankle/Brachial Index:                                   0.80  1.05   Previous ABI : 12/16/2005  >1.0  1.0  LOWER EXTREMITY ARTERIAL DUPLEX EXAM  DUPLEX:  Elevated velocity suggestive of stenosis >75% involving the proximal right common iliac artery with history of stent placement and the peak systolic velocity of 500 cm/s.  IMPRESSION: 1. Right common iliac artery stenosis as noted above. 2. Patent right lower extremity arteries distal to the iliac level     without hemodynamically significant plaque or stenosis identified. 3. Right ankle brachial index 0.80 in the mild claudication range. 4. Left ankle brachial index 1.05 and considered within normal range.  ___________________________________________ V. Charlena Cross, MD  SH/MEDQ  D:  05/23/2011  T:  05/23/2011  Job:  [id number]

## 2011-05-24 NOTE — Assessment & Plan Note (Signed)
OFFICE VISIT  Ashlee Snow, Ashlee Snow DOB:  05/13/1956                                       05/23/2011 NWGNF#:[id number]  REASON FOR VISIT:  Followup.  HISTORY:  This is Snow 55 year old female who I have been following for right leg claudication.  Dr. Madilyn Fireman put in Snow right common iliac stent in February of 2007.  This was Snow Genesis 7 x 29.  She has had chronic coolness and pain in her right foot as well as buttock claudication.  I metastatic her in January of 2011 for recurrent symptoms, repeated her angiogram and tried to dilate her stent that Dr. Madilyn Fireman put in but it was unresponsive and therefore I placed Snow Cordis 8 x 60 self-expanding stent.  For the past several months the patient has had worsening symptoms.  She describes her leg as getting cold.  She has some pain in her thigh which occurs even at rest and then she has some problems when she walks about 50 feet.  She has recently seen Dr. Lovell Sheehan, her neurosurgeon, and she has known disease in her back but it is unlikely to be responsive to surgical therapy according to her.  PHYSICAL EXAMINATION:  Vital signs:  Her heart rate is 72, blood pressure 116/76, O2 sats 97%.  General:  She is well-appearing, in no distress.  Respirations:  Nonlabored.  Cardiovascular :  Regular rate and rhythm.  She does not have palpable pulses on the right, palpable DP on the left.  Ultrasound was performed today that shows elevated velocities suggesting greater than 75% stenosis involving the proximal right common iliac artery with subsequent decrease in ABI at 0.8 down from 1.  ABI on the left is 1.0.  ASSESSMENT AND PLAN:  Peripheral vascular disease, history of right iliac stenting.  I have discussed proceeding with angiogram to further assess her stenosis as well as to intervene on what is presumed to be Snow recurrent iliac stenosis.  I think that this will most likely alleviate many of her symptoms but I have reiterated to  her that it may not relieve all of her symptoms.  I think some of them have Snow neurologic component to them.  This has been scheduled for Tuesday August 21.    Jorge Ny, MD Electronically Signed  VWB/MEDQ  D:  05/23/2011  T:  05/24/2011  Job:  [id number]  cc:   Kirstie Peri, MD

## 2011-06-14 ENCOUNTER — Ambulatory Visit (HOSPITAL_COMMUNITY)
Admission: RE | Admit: 2011-06-14 | Discharge: 2011-06-14 | Disposition: A | Discharge status: Home / Self Care | Payer: Medicaid Other | Source: Ambulatory Visit | Attending: Surgery | Admitting: Surgery

## 2011-06-14 DIAGNOSIS — I70219 Atherosclerosis of native arteries of extremities with intermittent claudication, unspecified extremity: Secondary | ICD-10-CM

## 2011-06-14 LAB — POCT I-STAT, CHEM 8
BUN: 19 mg/dL (ref 6–23)
Chloride: 104 mEq/L (ref 96–112)
Creatinine, Ser: 1 mg/dL (ref 0.50–1.10)
Glucose, Bld: 120 mg/dL — ABNORMAL HIGH (ref 70–99)
Hemoglobin: 13.9 g/dL (ref 12.0–15.0)
Potassium: 4.5 mEq/L (ref 3.5–5.1)
Sodium: 137 mEq/L (ref 135–145)

## 2011-06-14 LAB — POCT ACTIVATED CLOTTING TIME
Activated Clotting Time: 193 seconds
Activated Clotting Time: 176 seconds

## 2011-06-23 NOTE — Op Note (Signed)
Ashlee Snow, Ashlee Snow                 ACCOUNT NO.:  [id number]  MEDICAL RECORD NO.:  [id number]  LOCATION:  SDSC                         FACILITY:  [facility]  PHYSICIAN:  Juleen China IV, MDDATE OF BIRTH:  01-21-1956  DATE OF PROCEDURE:  06/14/2011 DATE OF DISCHARGE:                              OPERATIVE REPORT   PREOPERATIVE DIAGNOSIS:  Right leg claudication.  POSTOPERATIVE DIAGNOSIS:  Right leg claudication.  PROCEDURES PERFORMED: 1. Ultrasound access, right femoral artery. 2. Abdominal aortogram. 3. Bilateral lower extremity runoff. 4. Stent right common iliac artery.  INDICATION:  This is a 55 year old female with multiple medical problems who has previously undergone right common iliac stenting by Dr. Madilyn Fireman in 2007 and subsequently by myself in 2010.  She has previously had a genesis of 7 x 29 placed by Dr. Madilyn Fireman and 8 x 60 by myself.  She comes in today because ultrasound detected an elevation in velocities and decrease in ankle-brachial indices within her stent.  DESCRIPTION OF PROCEDURE:  The patient was identified in the holding area and taken to room A, placed supine on the table.  Both groins were prepped and draped in usual sterile fashion.  Time-out was called.  The right femoral artery was evaluated with ultrasound and found to be widely patent.  Digital ultrasound image was acquired.  Right femoral artery was then accessed under ultrasound guidance with micropuncture needle and 0.18 wire was then advanced into the aorta.  Under fluoroscopic visualization, micropuncture sheath was placed.  Next, a Bentson wire was inserted followed by removal of the micropuncture sheath and placement of a 5-French sheath.  OmniFlush catheter was advanced to the level of L1.  Abdominal aortogram was obtained.  Next, catheter was pulled down to the aortic bifurcation and bilateral runoff was performed.  FINDINGS:  Aortogram:  Visualized portions of the suprarenal  abdominal aorta showed no significant disease.  The right renal artery is widely patent.  There is approximately 70% stenosis within the left renal artery.  The infrarenal abdominal aorta is widely patent.  The left common iliac artery is widely patent.  The left external iliac artery is widely patent.  There is in-stent stenosis within the right common iliac stent.  The external iliac artery is patent on the right.  The right hypogastric artery is occluded, but reconstitutes from IMA collaterals. The left hypogastric artery appears occluded as well, but does reconstitute.  Left lower extremity:  The left common femoral artery is widely patent. The left profunda femoral and superficial femoral arteries are widely patent.  Popliteal artery is widely patent.  There is three-vessel runoff.  Right lower extremity:  The right common femoral artery is widely patent.  Right profunda femoral and superficial femoral artery is widely patent.  The dominant runoff to the right leg is the posterior tibial.  INTERVENTION:  At this point in time, the decision was made to intervene.  Over a Rosen wire, a 7-French bright tip sheath was inserted.  The patient was systemically heparinized.  I initially elected to perform balloon angioplasty of the in-stent stenosis.  A 6 x 2 Mustang was inflated to 24 atmospheres, held out for 30 seconds. Followup  study revealed suboptimal results with residual stenosis present.  I then upsized to a 7 x 40 balloon and performed balloon angioplasty taken to the balloon up to 24 atmospheres holding it up for 30 seconds.  This showed improved results, however, there was still residual hypodense area within the stent.  It appeared to me that there was residual disease trapped between the 2 previously placed stents.  I felt the best course of action at this time was to place a covered stent.  A iCAST (atrium) 7 x 38 covered stent was then deployed within the previous stent.   It was taken to 10 atmospheres.  Completion study revealed resolution of the stenosis within the stent.  At this point, decision was made to terminate the procedure.  Wires were removed.  The patient was taken to the holding area for sheath pull.  IMPRESSION: 1. High-grade left renal artery stenosis. 2. In-stent right common iliac stenosis that was unresponsive to     balloon angioplasty and treated with a covered stent, atrium 7 x 38     with excellent results.     Jorge Ny, MD     VWB/MEDQ  D:  06/14/2011  T:  06/14/2011  Job:  [id number]  Electronically Signed by Arelia Longest IV MD on 06/23/2011 12:08:37 AM

## 2011-07-08 ENCOUNTER — Emergency Department (HOSPITAL_COMMUNITY): Payer: Self-pay

## 2011-07-08 ENCOUNTER — Telehealth: Payer: Self-pay

## 2011-07-08 ENCOUNTER — Emergency Department (HOSPITAL_COMMUNITY)
Admission: EM | Admit: 2011-07-08 | Discharge: 2011-07-09 | Disposition: A | Discharge status: Home / Self Care | Payer: Self-pay | Attending: Emergency Medicine | Admitting: Emergency Medicine

## 2011-07-08 DIAGNOSIS — F411 Generalized anxiety disorder: Secondary | ICD-10-CM | POA: Insufficient documentation

## 2011-07-08 DIAGNOSIS — M25569 Pain in unspecified knee: Secondary | ICD-10-CM | POA: Insufficient documentation

## 2011-07-08 DIAGNOSIS — M549 Dorsalgia, unspecified: Secondary | ICD-10-CM | POA: Insufficient documentation

## 2011-07-08 DIAGNOSIS — G8929 Other chronic pain: Secondary | ICD-10-CM | POA: Insufficient documentation

## 2011-07-08 DIAGNOSIS — Z79899 Other long term (current) drug therapy: Secondary | ICD-10-CM | POA: Insufficient documentation

## 2011-07-08 DIAGNOSIS — I739 Peripheral vascular disease, unspecified: Secondary | ICD-10-CM | POA: Insufficient documentation

## 2011-07-08 DIAGNOSIS — M79609 Pain in unspecified limb: Secondary | ICD-10-CM | POA: Insufficient documentation

## 2011-07-08 DIAGNOSIS — K589 Irritable bowel syndrome without diarrhea: Secondary | ICD-10-CM | POA: Insufficient documentation

## 2011-07-08 DIAGNOSIS — E785 Hyperlipidemia, unspecified: Secondary | ICD-10-CM | POA: Insufficient documentation

## 2011-07-08 DIAGNOSIS — Z7982 Long term (current) use of aspirin: Secondary | ICD-10-CM | POA: Insufficient documentation

## 2011-07-08 DIAGNOSIS — I10 Essential (primary) hypertension: Secondary | ICD-10-CM | POA: Insufficient documentation

## 2011-07-08 DIAGNOSIS — E039 Hypothyroidism, unspecified: Secondary | ICD-10-CM | POA: Insufficient documentation

## 2011-07-08 LAB — POCT I-STAT, CHEM 8
BUN: 11 mg/dL (ref 6–23)
Chloride: 104 mEq/L (ref 96–112)
Creatinine, Ser: 1 mg/dL (ref 0.50–1.10)
Sodium: 138 mEq/L (ref 135–145)
TCO2: 25 mmol/L (ref 0–100)

## 2011-07-08 LAB — DIFFERENTIAL
Basophils Relative: 0 % (ref 0–1)
Lymphs Abs: 3.5 10*3/uL (ref 0.7–4.0)
Monocytes Relative: 8 % (ref 3–12)
Neutro Abs: 5.3 10*3/uL (ref 1.7–7.7)
Neutrophils Relative %: 52 % (ref 43–77)

## 2011-07-08 LAB — CBC
Hemoglobin: 13.1 g/dL (ref 12.0–15.0)
MCH: 31.5 pg (ref 26.0–34.0)
RBC: 4.16 MIL/uL (ref 3.87–5.11)
WBC: 10.1 10*3/uL (ref 4.0–10.5)

## 2011-07-08 LAB — D-DIMER, QUANTITATIVE: D-Dimer, Quant: 0.61 ug/mL-FEU — ABNORMAL HIGH (ref 0.00–0.48)

## 2011-07-08 MED ORDER — IOHEXOL 350 MG/ML SOLN
100.0000 mL | Freq: Once | INTRAVENOUS | Status: AC | PRN
  Administered 2011-07-08: 100 mL via INTRAVENOUS

## 2011-07-08 NOTE — Telephone Encounter (Signed)
Pt. Called with c/o "major pain" right upper leg.  States "it feels like there is a tourniquet around my right thigh.   It hurts when I bend my leg. I stopped taking my Plavix because it was causing a lot of stomach cramping, and I couldn't take it."  States took Plavix from 8/23-9/5. Has con't. her ASA daily.   Is s/p (R) common iliac stent of 8/21.  Pt. denies color change or temperature change in right leg.  Advised her to go to ER due to concern that the stent has occluded.  Pt. verbalized understanding.

## 2011-07-13 ENCOUNTER — Encounter: Payer: Self-pay | Admitting: Surgery

## 2011-07-15 ENCOUNTER — Encounter: Payer: Self-pay | Admitting: Surgery

## 2011-07-18 ENCOUNTER — Other Ambulatory Visit: Payer: Medicaid Other

## 2011-07-18 ENCOUNTER — Ambulatory Visit: Payer: Medicaid Other | Admitting: Surgery

## 2011-07-21 LAB — CBC
HCT: 39.4
Hemoglobin: 13.7
MCHC: 34.8
MCV: 87.6
RDW: 13.8

## 2011-07-21 LAB — DIFFERENTIAL
Basophils Absolute: 0
Basophils Relative: 0
Eosinophils Relative: 2
Monocytes Absolute: 0.7
Monocytes Relative: 7

## 2011-07-21 LAB — BASIC METABOLIC PANEL
CO2: 25
Chloride: 109
Glucose, Bld: 108 — ABNORMAL HIGH
Potassium: 4.1
Sodium: 137

## 2011-07-22 ENCOUNTER — Other Ambulatory Visit: Payer: Self-pay | Admitting: Vascular Surgery

## 2011-07-22 DIAGNOSIS — Z48811 Encounter for surgical aftercare following surgery on the nervous system: Secondary | ICD-10-CM

## 2011-07-22 DIAGNOSIS — I739 Peripheral vascular disease, unspecified: Secondary | ICD-10-CM

## 2011-08-29 ENCOUNTER — Ambulatory Visit: Payer: Self-pay | Admitting: Surgery

## 2011-08-29 ENCOUNTER — Other Ambulatory Visit: Payer: Self-pay

## 2012-02-03 ENCOUNTER — Encounter: Payer: Self-pay | Admitting: Surgery

## 2012-02-06 ENCOUNTER — Encounter: Payer: Self-pay | Admitting: Surgery

## 2012-02-06 ENCOUNTER — Ambulatory Visit (INDEPENDENT_AMBULATORY_CARE_PROVIDER_SITE_OTHER): Payer: Self-pay | Admitting: Surgery

## 2012-02-06 ENCOUNTER — Encounter (INDEPENDENT_AMBULATORY_CARE_PROVIDER_SITE_OTHER): Payer: Self-pay | Admitting: *Deleted

## 2012-02-06 ENCOUNTER — Ambulatory Visit (INDEPENDENT_AMBULATORY_CARE_PROVIDER_SITE_OTHER): Payer: Self-pay | Admitting: Vascular Surgery

## 2012-02-06 VITALS — BP 133/71 | HR 69 | Temp 98.1°F | Ht 60.0 in | Wt 133.8 lb

## 2012-02-06 DIAGNOSIS — I6529 Occlusion and stenosis of unspecified carotid artery: Secondary | ICD-10-CM

## 2012-02-06 DIAGNOSIS — I739 Peripheral vascular disease, unspecified: Secondary | ICD-10-CM | POA: Insufficient documentation

## 2012-02-06 DIAGNOSIS — G459 Transient cerebral ischemic attack, unspecified: Secondary | ICD-10-CM

## 2012-02-06 DIAGNOSIS — Z48812 Encounter for surgical aftercare following surgery on the circulatory system: Secondary | ICD-10-CM

## 2012-02-06 DIAGNOSIS — Z01810 Encounter for preprocedural cardiovascular examination: Secondary | ICD-10-CM

## 2012-02-06 NOTE — Progress Notes (Signed)
[facility] [place]   Patient name: Ashlee Snow MRN: [id number] DOB: [birth year] Sex: female     Chief Complaint  Patient presents with  . PVD    06-14-11 Aortogram w/stenting,  Pt c/o BLE pain R>L    HISTORY OF PRESENT ILLNESS: She comes in today for followup. She missed her initial post procedure followup. In September of 2012 she underwent attempted angioplasty of in-stent stenosis within her right common iliac artery. I was unsatisfied with the angioplasty results and therefore a covered stent, atrium 7 x 38 was placed with excellent results. She presented to the emergency department recently with complaints of leg pain and numbness. A CT angiogram was performed which showed widely patent aortoiliac arterial system. She does describe an episode of approximately one month ago where her left leg went completely down. She also describes a remaining in her years. She denies symptoms of amaurosis chest pain or shortness of breath. She has chronic pain and has been managed at a pain center. She also has a history of degenerative disc disease.  Past Medical History  Diagnosis Date  . Hypertension   . Fibromyalgia   . Sciatica   . DDD (degenerative disc disease)   . Peripheral vascular disease   . Migraine   . Bronchitis   . 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 History of depression   . Leg pain   . Asthma   . Cancer     lung  . Diabetes mellitus   . Stroke   . Ulcer   . Thyroid disease   . Hiatal hernia   . Peripheral arterial disease     Past Surgical History  Procedure Date  . Cholecystectomy 01/22/1997    Gall Bladder  . Abdominal hysterectomy   . Ovarian cyst surgery   . Aortogram w/ stenting 2007, 2011, 2012  . Mandible fracture surgery     TMJ surgery - right side    History   Social History  . Marital Status: Married    Spouse Name: N/A    Number of Children: N/A  . Years of Education: N/A    Occupational History  . Not on file.   Social History Main Topics  . Smoking status: Current Everyday Smoker -- 1.0 packs/day    Types: Cigarettes  . Smokeless tobacco: Not on file  . Alcohol Use: No  . Drug Use: No  . Sexually Active:    Other Topics Concern  . Not on file   Social History Narrative  . No narrative on file    Family History  Problem Relation Age of Onset  . Heart disease Mother   . Cancer Mother     lung  . Arthritis Mother   . Diabetes Mother   . Stroke Mother   . Hypertension Mother   . Heart disease Father   . Arthritis Father   . Heart failure Father   . Hypertension Father   . Stroke Father   . Peripheral vascular disease Father   . Heart disease Sister   . Arthritis Sister   . Kidney disease Sister   . Stroke Sister   . Fibromyalgia Sister     Allergies as of 02/06/2012 - Review Complete 02/06/2012  Allergen Reaction Noted  . Codeine      Current Outpatient Prescriptions on File Prior to Visit  Medication Sig Dispense Refill  . aspirin EC 325 MG tablet Take  325 mg by mouth daily.        Marland Kitchen BLACK COHOSH PO Take 1 capsule by mouth 3 (three) times daily.       Marland Kitchen gabapentin (NEURONTIN) 300 MG capsule Take 600 mg by mouth 3 (three) times daily.       Marland Kitchen levothyroxine (SYNTHROID, LEVOTHROID) 88 MCG tablet Take 88 mcg by mouth daily.        Marland Kitchen lisinopril (PRINIVIL,ZESTRIL) 20 MG tablet Take 20 mg by mouth daily.        Marland Kitchen oxyCODONE-acetaminophen (PERCOCET) 10-325 MG per tablet Take 1 tablet by mouth every 4 (four) hours as needed.           REVIEW OF SYSTEMS: Please see history of present illness. Otherwise review of systems is positive for pain in her legs as well as wheezing. All other systems are negative as documented by the patient and the encounter form.  PHYSICAL EXAMINATION:   Vital signs are BP 133/71  Pulse 69  Temp(Src) 98.1 F (36.7 C) (Oral)  Ht 5' (1.524 m)  Wt 133 lb 12.8 oz (60.691 kg)  BMI 26.13 kg/m2  SpO2  98% General: The patient appears their stated age. HEENT:  No gross abnormalities Pulmonary:  Non labored breathing Abdomen: Soft and non-tender Musculoskeletal: There are no major deformities. Neurologic: No focal weakness or paresthesias are detected, Skin: There are no ulcer or rashes noted. Psychiatric: The patient has normal affect. Cardiovascular: There is a regular rate and rhythm without significant murmur appreciated. No carotid bruits. Palpable dorsalis pedis pulse bilaterally   Diagnostic Studies Because of the symptoms in her leg particularly the left leg giving out with recovery, I elected to order a carotid duplex to make sure that she was not having TIAs from her carotid artery. Duplex ultrasound shows minimal disease in both carotid arteries. She has bilateral vertebral artery antegrade flow  Assessment: Peripheral artery disease status post iliac stenting Plan: From an arterial perspective the patient is doing very well. She has palpable pedal pulses and triphasic waveforms at her ankle. I do not think that her blood flow is responsible for these symptoms she is having. She does have a history of chronic back disease which is most likely the etiology. I did a carotid duplex ultrasound which also were without carotid disease is the explanation for her weakness.  She will followup in one month with a ultrasound duplex to  V. Charlena Cross, M.D. [facility] [place] Office: [phone] Pager:  [phone]

## 2012-02-07 NOTE — Progress Notes (Signed)
Addended by: Sharee Pimple on: 02/07/2012 12:38 PM   Modules accepted: Orders

## 2012-02-14 NOTE — Procedures (Unsigned)
CAROTID DUPLEX EXAM  INDICATION:  Transient ischemic attack  HISTORY: Diabetes:  No Cardiac:  No Hypertension:  Yes Smoking:  Currently Previous Surgery:  No carotid intervention CV History:  Recent TIA Amaurosis Fugax No, Paresthesias No, Hemiparesis No                                      RIGHT             LEFT Brachial systolic pressure:         122               140 Brachial Doppler waveforms:         Biphasic          Within normal limits Vertebral direction of flow:        Antegrade         Antegrade DUPLEX VELOCITIES (cm/sec) CCA peak systolic                   74                92 ECA peak systolic                   184               93 ICA peak systolic                   74                96 ICA end diastolic                   28                33 PLAQUE MORPHOLOGY:                  Heterogeneous     Heterogeneous PLAQUE AMOUNT:                      Mild              Mild PLAQUE LOCATION:                    CCA/ICA/ECA       CCA/ICA  IMPRESSION: 1. Bilateral internal carotid artery stenosis present in the 1%-39%     range. 2. Right external carotid artery stenosis present. 3. Left external carotid artery appears patent. 4. Bilateral vertebral arteries are patent and antegrade.  ___________________________________________ V. Charlena Cross, MD  SH/MEDQ  D:  02/06/2012  T:  02/06/2012  Job:  [id number]

## 2012-05-09 ENCOUNTER — Other Ambulatory Visit: Payer: Self-pay | Admitting: Neurology

## 2012-05-09 DIAGNOSIS — R439 Unspecified disturbances of smell and taste: Secondary | ICD-10-CM

## 2012-05-14 ENCOUNTER — Other Ambulatory Visit (HOSPITAL_COMMUNITY): Payer: Self-pay

## 2012-05-29 ENCOUNTER — Inpatient Hospital Stay (HOSPITAL_COMMUNITY): Admission: RE | Admit: 2012-05-29 | Payer: Self-pay | Source: Ambulatory Visit

## 2012-06-19 DIAGNOSIS — R079 Chest pain, unspecified: Secondary | ICD-10-CM

## 2012-08-01 ENCOUNTER — Ambulatory Visit (HOSPITAL_COMMUNITY): Payer: Self-pay

## 2012-08-06 ENCOUNTER — Emergency Department (HOSPITAL_COMMUNITY)
Admission: EM | Admit: 2012-08-06 | Discharge: 2012-08-06 | Disposition: A | Discharge status: Home / Self Care | Payer: Self-pay | Attending: Emergency Medicine | Admitting: Emergency Medicine

## 2012-08-06 ENCOUNTER — Encounter (HOSPITAL_COMMUNITY): Payer: Self-pay | Admitting: *Deleted

## 2012-08-06 DIAGNOSIS — F17200 Nicotine dependence, unspecified, uncomplicated: Secondary | ICD-10-CM | POA: Insufficient documentation

## 2012-08-06 DIAGNOSIS — Z8669 Personal history of other diseases of the nervous system and sense organs: Secondary | ICD-10-CM | POA: Insufficient documentation

## 2012-08-06 DIAGNOSIS — I10 Essential (primary) hypertension: Secondary | ICD-10-CM | POA: Insufficient documentation

## 2012-08-06 DIAGNOSIS — G43909 Migraine, unspecified, not intractable, without status migrainosus: Secondary | ICD-10-CM | POA: Insufficient documentation

## 2012-08-06 DIAGNOSIS — IMO0002 Reserved for concepts with insufficient information to code with codable children: Secondary | ICD-10-CM | POA: Insufficient documentation

## 2012-08-06 DIAGNOSIS — F329 Major depressive disorder, single episode, unspecified: Secondary | ICD-10-CM | POA: Insufficient documentation

## 2012-08-06 DIAGNOSIS — E079 Disorder of thyroid, unspecified: Secondary | ICD-10-CM | POA: Insufficient documentation

## 2012-08-06 DIAGNOSIS — I739 Peripheral vascular disease, unspecified: Secondary | ICD-10-CM | POA: Insufficient documentation

## 2012-08-06 DIAGNOSIS — F3289 Other specified depressive episodes: Secondary | ICD-10-CM | POA: Insufficient documentation

## 2012-08-06 DIAGNOSIS — G8929 Other chronic pain: Secondary | ICD-10-CM

## 2012-08-06 DIAGNOSIS — J45909 Unspecified asthma, uncomplicated: Secondary | ICD-10-CM | POA: Insufficient documentation

## 2012-08-06 DIAGNOSIS — IMO0001 Reserved for inherently not codable concepts without codable children: Secondary | ICD-10-CM | POA: Insufficient documentation

## 2012-08-06 DIAGNOSIS — Z760 Encounter for issue of repeat prescription: Secondary | ICD-10-CM | POA: Insufficient documentation

## 2012-08-06 MED ORDER — OXYCODONE-ACETAMINOPHEN 5-325 MG PO TABS: 2.0000 | ORAL_TABLET | Freq: Three times a day (TID) | ORAL | Status: AC | PRN

## 2012-08-06 MED ORDER — OXYCODONE-ACETAMINOPHEN 5-325 MG PO TABS
2.0000 | ORAL_TABLET | Freq: Once | ORAL | Status: AC
  Administered 2012-08-06: 2 via ORAL
  Filled 2012-08-06: qty 2

## 2012-08-06 NOTE — ED Notes (Addendum)
Pt presents to er for prescription refill of percocet's, pt is on percocet's due to chronic pain related to back problems, is pt of Dr. Gerilyn Pilgrim and has pain contract with him, pt states that she is not able to get her prescription filled tomorrow but needs assistance getting through today, pt states that her last pain tablet was Sunday afternoon. Pt c/o chills, nausea, and pain
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
## 2012-08-08 NOTE — ED Provider Notes (Signed)
Medical screening examination/treatment/procedure(s) were performed by non-physician practitioner and as supervising physician I was immediately available for consultation/collaboration.   Clara Smolen, MD 08/08/12 0255 

## 2012-11-27 ENCOUNTER — Emergency Department (HOSPITAL_COMMUNITY)
Admission: EM | Admit: 2012-11-27 | Discharge: 2012-11-27 | Disposition: A | Discharge status: Home / Self Care | Payer: Self-pay | Attending: Emergency Medicine | Admitting: Emergency Medicine

## 2012-11-27 ENCOUNTER — Encounter (HOSPITAL_COMMUNITY): Payer: Self-pay | Admitting: Emergency Medicine

## 2012-11-27 DIAGNOSIS — IMO0001 Reserved for inherently not codable concepts without codable children: Secondary | ICD-10-CM | POA: Insufficient documentation

## 2012-11-27 DIAGNOSIS — M5430 Sciatica, unspecified side: Secondary | ICD-10-CM | POA: Insufficient documentation

## 2012-11-27 DIAGNOSIS — M79609 Pain in unspecified limb: Secondary | ICD-10-CM | POA: Insufficient documentation

## 2012-11-27 DIAGNOSIS — I10 Essential (primary) hypertension: Secondary | ICD-10-CM | POA: Insufficient documentation

## 2012-11-27 DIAGNOSIS — IMO0002 Reserved for concepts with insufficient information to code with codable children: Secondary | ICD-10-CM | POA: Insufficient documentation

## 2012-11-27 DIAGNOSIS — F329 Major depressive disorder, single episode, unspecified: Secondary | ICD-10-CM | POA: Insufficient documentation

## 2012-11-27 DIAGNOSIS — E079 Disorder of thyroid, unspecified: Secondary | ICD-10-CM | POA: Insufficient documentation

## 2012-11-27 DIAGNOSIS — G8929 Other chronic pain: Secondary | ICD-10-CM

## 2012-11-27 DIAGNOSIS — G894 Chronic pain syndrome: Secondary | ICD-10-CM | POA: Insufficient documentation

## 2012-11-27 DIAGNOSIS — I739 Peripheral vascular disease, unspecified: Secondary | ICD-10-CM | POA: Insufficient documentation

## 2012-11-27 DIAGNOSIS — F17200 Nicotine dependence, unspecified, uncomplicated: Secondary | ICD-10-CM | POA: Insufficient documentation

## 2012-11-27 DIAGNOSIS — J45909 Unspecified asthma, uncomplicated: Secondary | ICD-10-CM | POA: Insufficient documentation

## 2012-11-27 DIAGNOSIS — M549 Dorsalgia, unspecified: Secondary | ICD-10-CM | POA: Insufficient documentation

## 2012-11-27 DIAGNOSIS — J40 Bronchitis, not specified as acute or chronic: Secondary | ICD-10-CM | POA: Insufficient documentation

## 2012-11-27 DIAGNOSIS — G43909 Migraine, unspecified, not intractable, without status migrainosus: Secondary | ICD-10-CM | POA: Insufficient documentation

## 2012-11-27 DIAGNOSIS — F3289 Other specified depressive episodes: Secondary | ICD-10-CM | POA: Insufficient documentation

## 2012-11-27 MED ORDER — OXYCODONE-ACETAMINOPHEN 5-325 MG PO TABS
2.0000 | ORAL_TABLET | Freq: Once | ORAL | Status: AC
  Administered 2012-11-27: 2 via ORAL
  Filled 2012-11-27: qty 2
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
## 2012-12-02 NOTE — ED Provider Notes (Signed)
Medical screening examination/treatment/procedure(s) were performed by non-physician practitioner and as supervising physician I was immediately available for consultation/collaboration.  Marquest Gunkel, MD 12/02/12 0115 

## 2013-02-18 ENCOUNTER — Other Ambulatory Visit: Payer: Self-pay

## 2013-02-18 ENCOUNTER — Ambulatory Visit: Payer: Self-pay | Admitting: Neurosurgery

## 2014-01-01 ENCOUNTER — Emergency Department (HOSPITAL_COMMUNITY)
Admission: EM | Admit: 2014-01-01 | Discharge: 2014-01-01 | Disposition: A | Discharge status: Home / Self Care | Payer: BC Managed Care – PPO | Attending: Emergency Medicine | Admitting: Emergency Medicine

## 2014-01-01 ENCOUNTER — Emergency Department (HOSPITAL_COMMUNITY): Payer: BC Managed Care – PPO

## 2014-01-01 ENCOUNTER — Encounter (HOSPITAL_COMMUNITY): Payer: Self-pay | Admitting: Emergency Medicine

## 2014-01-01 DIAGNOSIS — M5416 Radiculopathy, lumbar region: Secondary | ICD-10-CM

## 2014-01-01 DIAGNOSIS — F17200 Nicotine dependence, unspecified, uncomplicated: Secondary | ICD-10-CM | POA: Insufficient documentation

## 2014-01-01 DIAGNOSIS — Z79899 Other long term (current) drug therapy: Secondary | ICD-10-CM | POA: Insufficient documentation

## 2014-01-01 DIAGNOSIS — IMO0002 Reserved for concepts with insufficient information to code with codable children: Secondary | ICD-10-CM | POA: Insufficient documentation

## 2014-01-01 DIAGNOSIS — G8929 Other chronic pain: Secondary | ICD-10-CM | POA: Insufficient documentation

## 2014-01-01 DIAGNOSIS — Z872 Personal history of diseases of the skin and subcutaneous tissue: Secondary | ICD-10-CM | POA: Insufficient documentation

## 2014-01-01 DIAGNOSIS — I10 Essential (primary) hypertension: Secondary | ICD-10-CM | POA: Insufficient documentation

## 2014-01-01 DIAGNOSIS — E079 Disorder of thyroid, unspecified: Secondary | ICD-10-CM | POA: Insufficient documentation

## 2014-01-01 DIAGNOSIS — J45909 Unspecified asthma, uncomplicated: Secondary | ICD-10-CM | POA: Insufficient documentation

## 2014-01-01 DIAGNOSIS — G43909 Migraine, unspecified, not intractable, without status migrainosus: Secondary | ICD-10-CM | POA: Insufficient documentation

## 2014-01-01 DIAGNOSIS — Z8659 Personal history of other mental and behavioral disorders: Secondary | ICD-10-CM | POA: Insufficient documentation

## 2014-01-01 LAB — I-STAT CHEM 8, ED
BUN: 15 mg/dL (ref 6–23)
CREATININE: 1.2 mg/dL — AB (ref 0.50–1.10)
Calcium, Ion: 1.23 mmol/L (ref 1.12–1.23)
Chloride: 105 mEq/L (ref 96–112)
Glucose, Bld: 100 mg/dL — ABNORMAL HIGH (ref 70–99)
HEMATOCRIT: 44 % (ref 36.0–46.0)
HEMOGLOBIN: 15 g/dL (ref 12.0–15.0)
Potassium: 4.3 mEq/L (ref 3.7–5.3)
SODIUM: 140 meq/L (ref 137–147)
TCO2: 24 mmol/L (ref 0–100)

## 2014-01-01 MED ORDER — HYDROMORPHONE HCL PF 1 MG/ML IJ SOLN
1.0000 mg | Freq: Once | INTRAMUSCULAR | Status: AC
  Administered 2014-01-01: 1 mg via INTRAVENOUS
  Filled 2014-01-01: qty 1

## 2014-01-01 MED ORDER — HYDROMORPHONE HCL PF 2 MG/ML IJ SOLN
2.0000 mg | Freq: Once | INTRAMUSCULAR | Status: AC
Start: 2014-01-01 — End: 2014-01-01
  Administered 2014-01-01: 2 mg via INTRAVENOUS

## 2014-01-01 NOTE — ED Notes (Signed)
Pt c/o "severe" lower back pain that radiates to right groin and down right leg. Pt states "last time this happened they put me in the hospital". Pt also states "they put two stents in my groin".

## 2014-01-01 NOTE — Discharge Instructions (Signed)
SEEK IMMEDIATE MEDICAL ATTENTION IF: New numbness, tingling, weakness, or problem with the use of your arms or legs.  Severe back pain not relieved with medications.  Change in bowel or bladder control (if you lose control of stool or urine, or if you are unable to urinate) Increasing pain in any areas of the body (such as chest or abdominal pain).  Shortness of breath, dizziness or fainting.  Nausea (feeling sick to your stomach), vomiting, fever, or sweats.   YOU HAVE RECEIVED DILAUDID HERE IN THE ER FOR PAIN CONTROL

## 2014-01-01 NOTE — ED Provider Notes (Signed)
CSN: [id number]     Arrival date & time 01/01/14  1630 History  This chart was scribed for Joya Gaskins, MD by Ardelia Mems, ED Scribe. This patient was seen in room APA09/[facility]09 and the patient's care was started at 4:48 PM.   Chief Complaint  Patient presents with  . Back Pain    Patient is a 58 y.o. female presenting with back pain. The history is provided by the patient. No language interpreter was used.  Back Pain Location:  Lumbar spine Quality:  Unable to specify Radiates to: bilateral legs (right worse than left) Pain severity:  Severe Pain is:  Unable to specify Onset quality:  Sudden Duration:  24 hours (chronic, but acutely worsened last night) Timing:  Constant Progression:  Waxing and waning Chronicity:  Chronic Context: not falling and not recent injury   Relieved by:  Nothing Worsened by:  Sitting and movement Ineffective treatments:  Narcotics (prescribed Oxycodone, Robaxin and Gabapentin all taken without relief) Associated symptoms: no abdominal pain, no bladder incontinence, no bowel incontinence, no chest pain, no dysuria, no fever, no numbness and no weakness     HPI Comments: Ashlee Snow is a 59 y.o. female with a history of chronic back pain related to DDD and sciatica who presents to the Emergency Department complaining of acutely worsened lower back pain onset last night. She reports that the pain begins in her lower back and radiates to her bilateral legs. She states that the radiation of pain is more severe in her right leg. She reports that her back pain is worsened with sitting and also with moving her legs. She denies any recent falls or injuries to her back. She states that she is prescribed Oxycodone for chronic pain, and that she last took this 2 hours ago without relief. She states that she has also taken Gabapentin and robaxin today without relief. She states that she has a history of 3 facet blocks, and also has stents in her groin/legs, but  denies any history of back surgery. She denies fever, vomiting, bladder or bowel incontinence, dysuria, weakness in arms or legs, chest pain , abdominal pain, new saddle anesthesia or any other symptoms. She denies any recent hospital admission, although states that she has been admitted in the past with severe back pain.  PCP- Dr. Kirstie Peri (She states that she has also been followed by Dr. Gerilyn Pilgrim and Dr. Malen Gauze for her pain)    Past Medical History  Diagnosis Date  . Hypertension   . Fibromyalgia   . Sciatica   . DDD (degenerative disc disease)   . Peripheral vascular disease   . Migraine   . Bronchitis   . 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 History of depression   . Leg pain   . Asthma   . Ulcer   . Thyroid disease   . Peripheral arterial disease    Past Surgical History  Procedure Laterality Date  . Cholecystectomy  01/22/1997    Gall Bladder  . Abdominal hysterectomy    . Ovarian cyst surgery    . Aortogram w/ stenting  2007, 2011, 2012  . Mandible fracture surgery      TMJ surgery - right side   Family History  Problem Relation Age of Onset  . Heart disease Mother   . Cancer Mother     lung  . Arthritis Mother   . Diabetes Mother   . Stroke Mother   .  Hypertension Mother   . Heart disease Father   . Arthritis Father   . Heart failure Father   . Hypertension Father   . Stroke Father   . Peripheral vascular disease Father   . Heart disease Sister   . Arthritis Sister   . Kidney disease Sister   . Stroke Sister   . Fibromyalgia Sister    History  Substance Use Topics  . Smoking status: Current Every Day Smoker -- 1.00 packs/day    Types: Cigarettes  . Smokeless tobacco: Never Used  . Alcohol Use: No   OB History   Grav Para Term Preterm Abortions TAB SAB Ect Mult Living   2 2 2       2      Review of Systems  Constitutional: Negative for fever.  Cardiovascular: Negative for chest pain.   Gastrointestinal: Negative for vomiting, abdominal pain and bowel incontinence.       Denies bowel incontinence  Genitourinary: Negative for bladder incontinence and dysuria.       Denies bladder incontinence  Musculoskeletal: Positive for back pain.  Neurological: Negative for weakness and numbness.  All other systems reviewed and are negative.   Allergies  Review of patient's allergies indicates no known allergies.  Home Medications   Current Outpatient Rx  Name  Route  Sig  Dispense  Refill  . Aspirin-Acetaminophen-Caffeine (GOODY HEADACHE PO)   Oral   Take 1 Package by mouth 2 (two) times daily as needed. Migraines.         Marland Kitchen. BLACK COHOSH PO   Oral   Take 1 capsule by mouth 3 (three) times daily.          Marland Kitchen. gabapentin (NEURONTIN) 600 MG tablet   Oral   Take 600 mg by mouth 3 (three) times daily.         Marland Kitchen. levothyroxine (SYNTHROID, LEVOTHROID) 75 MCG tablet   Oral   Take 75 mcg by mouth daily before breakfast.         . lisinopril (PRINIVIL,ZESTRIL) 20 MG tablet   Oral   Take 20 mg by mouth daily.           . methocarbamol (ROBAXIN) 750 MG tablet   Oral   Take 750 mg by mouth 3 (three) times daily.         Marland Kitchen. oxyCODONE-acetaminophen (PERCOCET) 10-325 MG per tablet   Oral   Take 1 tablet by mouth 3 (three) times daily.           Triage Vitals: BP 135/68  Pulse 62  Temp(Src) 98.2 F (36.8 C) (Oral)  Resp 20  SpO2 94%  Physical Exam  Nursing note and vitals reviewed. CONSTITUTIONAL: Well developed/well nourished, anxious HEAD: Normocephalic/atraumatic EYES: EOMI/PERRL ENMT: Mucous membranes moist NECK: supple no meningeal signs SPINE:lumbar paraspinal tenderness, No bruising/crepitance/stepoffs noted to spine CV: S1/S2 noted, no murmurs/rubs/gallops noted LUNGS: Lungs are clear to auscultation bilaterally, no apparent distress ABDOMEN: soft, nontender, no rebound or guarding GU:no cva tenderness NEURO: Awake/alert, no saddle anesthesia,  limited due to pain but equal motor strength noted with the following: hip flexion/knee flexion/extension, foot dorsi/plantar flexion, great toe extension intact bilaterally, no clonus bilaterally, plantar reflex appropriate (toes downgoing), no sensory deficit in any dermatome.  Equal patellar/achilles reflex noted (2+) in bilateral lower extremities.   EXTREMITIES: pulses normal, full ROM SKIN: warm, color normal PSYCH: anxious   ED Course  Procedures   DIAGNOSTIC STUDIES: Oxygen Saturation is 94% on RA, adequate by my interpretation.  COORDINATION OF CARE: 4:55 PM- Discussed plan for pain management. Pt advised of plan for treatment and pt agrees. Pt with acute pain though does not appear to have acute neuro deficits and she is vascularly intact ' Will need pain control, reassessment and will need to ambulate 8:09 PM Pt reports right hip pain, but denies trauma She requests right hip xray Pt is not improving though no new neuro deficits noted on exam Will obtain right hip imaging 9:17 PM Right hip xray negative Pt improved, eating crackers, sitting up in bed She can bear weight and ambulate (uses walker at times and feels at baseline) No gross neuro deficits noted She does reports pain does continue to radiate into right LE but improved I feel she is safe/stable for d/c home She feels comfortable with this plan We discussed strict return precautions We discussed need for close outpatient followup  Medications  HYDROmorphone (DILAUDID) injection 1 mg (1 mg Intravenous Given 01/01/14 1707)  HYDROmorphone (DILAUDID) injection 1 mg (1 mg Intravenous Given 01/01/14 1827)  HYDROmorphone (DILAUDID) injection 2 mg (2 mg Intravenous Given 01/01/14 1955)   Labs Review Labs Reviewed  I-STAT CHEM 8, ED - Abnormal; Notable for the following:    Creatinine, Ser 1.20 (*)    Glucose, Bld 100 (*)    All other components within normal limits      MDM   Final diagnoses:  Lumbar  radiculopathy   Nursing notes including past medical history and social history reviewed and considered in documentation Labs/vital reviewed and considered xrays reviewed and considered    I personally performed the services described in this documentation, which was scribed in my presence. The recorded information has been reviewed and is accurate.      Joya Gaskins, MD 01/01/14 2118
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
## 2014-05-07 ENCOUNTER — Other Ambulatory Visit: Payer: Self-pay | Admitting: *Deleted

## 2014-05-07 DIAGNOSIS — I6529 Occlusion and stenosis of unspecified carotid artery: Secondary | ICD-10-CM

## 2014-05-07 DIAGNOSIS — Z48812 Encounter for surgical aftercare following surgery on the circulatory system: Secondary | ICD-10-CM

## 2014-05-07 DIAGNOSIS — I739 Peripheral vascular disease, unspecified: Secondary | ICD-10-CM

## 2014-07-11 ENCOUNTER — Encounter: Payer: Self-pay | Admitting: Surgery

## 2014-07-14 ENCOUNTER — Encounter (HOSPITAL_COMMUNITY): Payer: BC Managed Care – PPO

## 2014-07-14 ENCOUNTER — Other Ambulatory Visit (HOSPITAL_COMMUNITY): Payer: BC Managed Care – PPO

## 2014-07-14 ENCOUNTER — Ambulatory Visit: Payer: BC Managed Care – PPO | Admitting: Surgery

## 2014-07-28 ENCOUNTER — Encounter (HOSPITAL_COMMUNITY): Payer: Self-pay | Admitting: Emergency Medicine

## 2014-07-28 ENCOUNTER — Emergency Department (HOSPITAL_COMMUNITY): Payer: BC Managed Care – PPO

## 2014-07-28 ENCOUNTER — Emergency Department (HOSPITAL_COMMUNITY)
Admission: EM | Admit: 2014-07-28 | Discharge: 2014-07-29 | Disposition: A | Discharge status: Home / Self Care | Payer: BC Managed Care – PPO | Attending: Emergency Medicine | Admitting: Emergency Medicine

## 2014-07-28 DIAGNOSIS — I10 Essential (primary) hypertension: Secondary | ICD-10-CM | POA: Insufficient documentation

## 2014-07-28 DIAGNOSIS — Z720 Tobacco use: Secondary | ICD-10-CM | POA: Diagnosis not present

## 2014-07-28 DIAGNOSIS — Y9289 Other specified places as the place of occurrence of the external cause: Secondary | ICD-10-CM | POA: Diagnosis not present

## 2014-07-28 DIAGNOSIS — Z791 Long term (current) use of non-steroidal anti-inflammatories (NSAID): Secondary | ICD-10-CM | POA: Diagnosis not present

## 2014-07-28 DIAGNOSIS — Z7982 Long term (current) use of aspirin: Secondary | ICD-10-CM | POA: Insufficient documentation

## 2014-07-28 DIAGNOSIS — I739 Peripheral vascular disease, unspecified: Secondary | ICD-10-CM | POA: Insufficient documentation

## 2014-07-28 DIAGNOSIS — R251 Tremor, unspecified: Secondary | ICD-10-CM | POA: Diagnosis not present

## 2014-07-28 DIAGNOSIS — G43909 Migraine, unspecified, not intractable, without status migrainosus: Secondary | ICD-10-CM | POA: Diagnosis not present

## 2014-07-28 DIAGNOSIS — Z79899 Other long term (current) drug therapy: Secondary | ICD-10-CM | POA: Diagnosis not present

## 2014-07-28 DIAGNOSIS — Y9389 Activity, other specified: Secondary | ICD-10-CM | POA: Insufficient documentation

## 2014-07-28 DIAGNOSIS — W010XXA Fall on same level from slipping, tripping and stumbling without subsequent striking against object, initial encounter: Secondary | ICD-10-CM | POA: Insufficient documentation

## 2014-07-28 DIAGNOSIS — Z8709 Personal history of other diseases of the respiratory system: Secondary | ICD-10-CM | POA: Insufficient documentation

## 2014-07-28 DIAGNOSIS — E079 Disorder of thyroid, unspecified: Secondary | ICD-10-CM | POA: Insufficient documentation

## 2014-07-28 DIAGNOSIS — G8929 Other chronic pain: Secondary | ICD-10-CM | POA: Insufficient documentation

## 2014-07-28 DIAGNOSIS — Z8659 Personal history of other mental and behavioral disorders: Secondary | ICD-10-CM | POA: Insufficient documentation

## 2014-07-28 DIAGNOSIS — R42 Dizziness and giddiness: Secondary | ICD-10-CM

## 2014-07-28 DIAGNOSIS — Z87828 Personal history of other (healed) physical injury and trauma: Secondary | ICD-10-CM | POA: Diagnosis not present

## 2014-07-28 DIAGNOSIS — Z8673 Personal history of transient ischemic attack (TIA), and cerebral infarction without residual deficits: Secondary | ICD-10-CM | POA: Diagnosis not present

## 2014-07-28 DIAGNOSIS — Z872 Personal history of diseases of the skin and subcutaneous tissue: Secondary | ICD-10-CM | POA: Insufficient documentation

## 2014-07-28 DIAGNOSIS — S3992XA Unspecified injury of lower back, initial encounter: Secondary | ICD-10-CM | POA: Diagnosis not present

## 2014-07-28 DIAGNOSIS — J45909 Unspecified asthma, uncomplicated: Secondary | ICD-10-CM | POA: Diagnosis not present

## 2014-07-28 LAB — CBC WITH DIFFERENTIAL/PLATELET
Basophils Absolute: 0 10*3/uL (ref 0.0–0.1)
Basophils Relative: 0 % (ref 0–1)
Eosinophils Absolute: 0.2 10*3/uL (ref 0.0–0.7)
Eosinophils Relative: 3 % (ref 0–5)
HCT: 35.3 % — ABNORMAL LOW (ref 36.0–46.0)
Hemoglobin: 12 g/dL (ref 12.0–15.0)
Lymphocytes Relative: 33 % (ref 12–46)
Lymphs Abs: 2.1 10*3/uL (ref 0.7–4.0)
MCH: 31.6 pg (ref 26.0–34.0)
MCHC: 34 g/dL (ref 30.0–36.0)
MCV: 92.9 fL (ref 78.0–100.0)
Monocytes Absolute: 0.5 10*3/uL (ref 0.1–1.0)
Monocytes Relative: 7 % (ref 3–12)
Neutro Abs: 3.7 10*3/uL (ref 1.7–7.7)
Neutrophils Relative %: 57 % (ref 43–77)
Platelets: 162 10*3/uL (ref 150–400)
RBC: 3.8 MIL/uL — ABNORMAL LOW (ref 3.87–5.11)
RDW: 13.8 % (ref 11.5–15.5)
WBC: 6.5 10*3/uL (ref 4.0–10.5)

## 2014-07-28 LAB — COMPREHENSIVE METABOLIC PANEL
ALT: 113 U/L — ABNORMAL HIGH (ref 0–35)
AST: 36 U/L (ref 0–37)
Albumin: 3.4 g/dL — ABNORMAL LOW (ref 3.5–5.2)
Alkaline Phosphatase: 96 U/L (ref 39–117)
Anion gap: 11 (ref 5–15)
BUN: 21 mg/dL (ref 6–23)
CO2: 25 mEq/L (ref 19–32)
Calcium: 8.9 mg/dL (ref 8.4–10.5)
Chloride: 102 mEq/L (ref 96–112)
Creatinine, Ser: 1.16 mg/dL — ABNORMAL HIGH (ref 0.50–1.10)
GFR calc Af Amer: 60 mL/min — ABNORMAL LOW (ref >90)
GFR calc non Af Amer: 52 mL/min — ABNORMAL LOW (ref >90)
Glucose, Bld: 106 mg/dL — ABNORMAL HIGH (ref 70–99)
Potassium: 4.4 mEq/L (ref 3.7–5.3)
Sodium: 138 mEq/L (ref 137–147)
Total Bilirubin: <0.2 mg/dL — ABNORMAL LOW (ref 0.3–1.2)
Total Protein: 7 g/dL (ref 6.0–8.3)

## 2014-07-28 MED ORDER — DIAZEPAM 5 MG PO TABS
5.0000 mg | ORAL_TABLET | Freq: Once | ORAL | Status: AC
  Administered 2014-07-28: 5 mg via ORAL
  Filled 2014-07-28: qty 1

## 2014-07-28 MED ORDER — OXYCODONE-ACETAMINOPHEN 5-325 MG PO TABS
1.0000 | ORAL_TABLET | Freq: Once | ORAL | Status: AC
  Administered 2014-07-28: 1 via ORAL
  Filled 2014-07-28: qty 1

## 2014-07-28 MED ORDER — MECLIZINE HCL 12.5 MG PO TABS
25.0000 mg | ORAL_TABLET | Freq: Once | ORAL | Status: AC
  Administered 2014-07-28: 25 mg via ORAL
  Filled 2014-07-28: qty 2

## 2014-07-28 NOTE — ED Provider Notes (Signed)
CSN: [id number]     Arrival date & time 07/28/14  1815 History   This chart was scribed for Lyanne Co, MD by Gwenyth Ober, ED Scribe. This patient was seen in room APA11/APA11 and the patient's care was started at 10:08 PM.    Chief Complaint  Patient presents with  . Dizziness   The history is provided by the patient. No language interpreter was used.   HPI Comments: Ashlee Snow is a 58 y.o. female with chronic lumbar back pain who presents to the Emergency Department complaining of constant dizziness and imbalance that started 3 days ago.  Pt denies room-spinning dizziness currently, but had such sensations last evening. Pt states symptoms started while she was washing dishes 3 days PTA. She says that her hands started shaking and she felt a sudden pain in her low back. She has had difficulty balancing while walking since this episode. She denies one-sided weakness or weakness in arms.  Pt denies nausea and vomiting as associated symptoms. Pt also complains of worse-than-baseline lumbar back pain after she tripped 1 week ago.  Pt states she recently had a increase in dosage for Lyrica medication. Pt currently takes Percocet and states she has a herniated disc and a pinched nerve.  Past Medical History  Diagnosis Date  . Hypertension   . Fibromyalgia   . Sciatica   . DDD (degenerative disc disease)   . Peripheral vascular disease   . Migraine   . Bronchitis   . 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 History of depression   . Leg pain   . Asthma   . Ulcer   . Thyroid disease   . Peripheral arterial disease    Past Surgical History  Procedure Laterality Date  . Cholecystectomy  01/22/1997    Gall Bladder  . Abdominal hysterectomy    . Ovarian cyst surgery    . Aortogram w/ stenting  2007, 2011, 2012  . Mandible fracture surgery      TMJ surgery - right side   Family History  Problem Relation Age of Onset  . Heart disease Mother    . Cancer Mother     lung  . Arthritis Mother   . Diabetes Mother   . Stroke Mother   . Hypertension Mother   . Heart disease Father   . Arthritis Father   . Heart failure Father   . Hypertension Father   . Stroke Father   . Peripheral vascular disease Father   . Heart disease Sister   . Arthritis Sister   . Kidney disease Sister   . Stroke Sister   . Fibromyalgia Sister    History  Substance Use Topics  . Smoking status: Current Every Day Smoker -- 1.00 packs/day    Types: Cigarettes  . Smokeless tobacco: Never Used  . Alcohol Use: No   OB History   Grav Para Term Preterm Abortions TAB SAB Ect Mult Living   2 2 2       2      Review of Systems  Musculoskeletal: Positive for back pain.  Neurological: Positive for dizziness. Negative for weakness.   10 Systems reviewed and all are negative for acute change except as noted in the HPI.  Allergies  Review of patient's allergies indicates no known allergies.  Home Medications   Prior to Admission medications   Medication Sig Start Date End Date Taking? Authorizing Provider  aspirin EC 81  MG tablet Take 81 mg by mouth every morning.   Yes Historical Provider, MD  BLACK COHOSH PO Take 2 capsules by mouth 3 (three) times daily.    Yes Historical Provider, MD  hydrOXYzine (VISTARIL) 50 MG capsule Take 50 mg by mouth daily as needed for itching.  07/03/14  Yes Historical Provider, MD  levothyroxine (SYNTHROID, LEVOTHROID) 75 MCG tablet Take 75 mcg by mouth daily before breakfast.   Yes Historical Provider, MD  lisinopril (PRINIVIL,ZESTRIL) 20 MG tablet Take 20 mg by mouth every morning.    Yes Historical Provider, MD  Melatonin 3 MG CAPS Take 2-3 capsules by mouth at bedtime.   Yes Historical Provider, MD  meloxicam (MOBIC) 15 MG tablet Take 15 mg by mouth daily.   Yes Historical Provider, MD  methocarbamol (ROBAXIN) 750 MG tablet Take 750 mg by mouth 3 (three) times daily. 12/25/13  Yes Historical Provider, MD  metoprolol  succinate (TOPROL-XL) 25 MG 24 hr tablet Take 25 mg by mouth every morning.   Yes Historical Provider, MD  oxyCODONE-acetaminophen (PERCOCET) 10-325 MG per tablet Take 1 tablet by mouth 3 (three) times daily.    Yes Historical Provider, MD  pravastatin (PRAVACHOL) 20 MG tablet Take 20 mg by mouth daily.   Yes Historical Provider, MD  pregabalin (LYRICA) 100 MG capsule Take 100 mg by mouth 2 (two) times daily.   Yes Historical Provider, MD  senna-docusate (STOOL SOFTENER PLUS LAXATIVE) 8.6-50 MG per tablet Take 1 tablet by mouth daily.   Yes Historical Provider, MD  triamcinolone cream (KENALOG) 0.1 % Apply 1 application topically daily. Applied to legs for itching 07/03/14  Yes Historical Provider, MD   BP 118/69  Pulse 69  Temp(Src) 98.7 F (37.1 C) (Oral)  Resp 18  Ht 5\' 4"  (1.626 m)  Wt 132 lb (59.875 kg)  BMI 22.65 kg/m2  SpO2 97% Physical Exam  Nursing note and vitals reviewed. Constitutional: She is oriented to person, place, and time. She appears well-developed and well-nourished. No distress.  HENT:  Head: Normocephalic and atraumatic.  Eyes: EOM are normal. Pupils are equal, round, and reactive to light.  Neck: Normal range of motion.  Cardiovascular: Normal rate, regular rhythm and normal heart sounds.   Pulmonary/Chest: Effort normal and breath sounds normal.  Abdominal: Soft. She exhibits no distension. There is no tenderness.  Musculoskeletal: Normal range of motion. She exhibits tenderness.  Lumbar and paralumbar tenderness  Neurological: She is alert and oriented to person, place, and time.  5/5 strength in major muscle groups of  bilateral upper and lower extremities. Speech normal. No facial asymetry.   Skin: Skin is warm and dry.  Psychiatric: She has a normal mood and affect. Judgment normal.    ED Course  Procedures (including critical care time) DIAGNOSTIC STUDIES: Oxygen Saturation is 97% on RA, normal by my interpretation.    COORDINATION OF CARE: 10:18  PM Will order Percocet, Valium, Antivert and an x-ray of lumbar spine.  Discussed treatment plan with pt at bedside and pt agreed to plan.  Labs Review Labs Reviewed  CBC WITH DIFFERENTIAL - Abnormal; Notable for the following:    RBC 3.80 (*)    HCT 35.3 (*)    All other components within normal limits  COMPREHENSIVE METABOLIC PANEL - Abnormal; Notable for the following:    Glucose, Bld 106 (*)    Creatinine, Ser 1.16 (*)    Albumin 3.4 (*)    ALT 113 (*)    Total Bilirubin <0.2 (*)  GFR calc non Af Amer 52 (*)    GFR calc Af Amer 60 (*)    All other components within normal limits    Imaging Review Dg Lumbar Spine Complete  07/28/2014   CLINICAL DATA:  Low back pain extending into both legs following minor injury 1 week ago. Chronic back pain.  EXAM: LUMBAR SPINE - COMPLETE 4+ VIEW  COMPARISON:  Acute abdominal series 03/06/2014. Abdominal pelvic CT 02/28/2014.  FINDINGS: There are 5 lumbar type vertebral bodies. There is chronic disc space loss at L4-5 and L5-S1 with associated endplate sclerosis. The additional disc spaces are preserved. There is no evidence of acute fracture or pars defect. Mild facet disease is present inferiorly. A right iliac vascular stent and cholecystectomy clips are noted.  IMPRESSION: Stable lumbar lordosis, most advanced at L4-5. No acute osseous findings.   Electronically Signed   By: Roxy HorsemanBill  Veazey M.D.   On: 07/28/2014 23:24   Ct Head Wo Contrast  07/29/2014   CLINICAL DATA:  Initial encounter for dizziness for 3 days. Tripped and fell 1 week ago hitting head on floor of the same level. Diplopia on Saturday.  EXAM: CT HEAD WITHOUT CONTRAST  TECHNIQUE: Contiguous axial images were obtained from the base of the skull through the vertex without intravenous contrast.  COMPARISON:  CT head without contrast 11/19/2007.  FINDINGS: A new hypodense lesion is noted along the anterior aspect of the right lentiform nucleus and anterior limb of the internal capsule. This  is near CSF density. There is also evidence for a non acute infarct involving the high posterior right frontal lobe.  No acute cortical infarct, hemorrhage, or mass lesion is present. The ventricles are of normal size. The right ventricle is slightly larger anteriorly, likely reflecting ex vacuo dilation.  The paranasal sinuses and mastoid air cells are clear. A prosthetic right temporal fossa is noted. Atherosclerotic calcifications are present within the cavernous carotid arteries.  IMPRESSION: 1. 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 2. No acute intracranial abnormality.   Electronically Signed   By: Gennette Pachris  Mattern M.D.   On: 07/29/2014 00:05  I personally reviewed the imaging tests through PACS system I reviewed available ER/hospitalization records through the EMR    EKG Interpretation None      MDM   Final diagnoses:  Vertigo   Patient feels better after Valium and meclizine.  She is ambulatory in the room at this time.  I suspect this is more peripheral vertigo issue.  She has had a history of stroke before therefore a central vertigo is a possibility but my suspicion is the last for this.  She has a good neurologist now has that she followup with her neurologist.  She may benefit from MRI as an outpatient.  In the meantime I've recommended risk modification strategies and the 81 mg aspirin daily.  I recommended that she stop smoking tobacco.   Medications  meclizine (ANTIVERT) tablet 25 mg (25 mg Oral Given 07/28/14 2235)  oxyCODONE-acetaminophen (PERCOCET/ROXICET) 5-325 MG per tablet 1 tablet (1 tablet Oral Given 07/28/14 2235)  diazepam (VALIUM) tablet 5 mg (5 mg Oral Given 07/28/14 2235)    I personally performed the services described in this documentation, which was scribed in my presence. The recorded information has been reviewed and is accurate.    Lyanne CoKevin M Donovan Gatchel, MD 07/29/14 0040

## 2014-07-28 NOTE — ED Notes (Signed)
Pt reporting that she continues to experience pain.  However, pt reports being more "relaxed and sleepy".  Reinforced with pt that due to her chronic pain, she may not be completely pain free.  Pt does appear more comfortable.

## 2014-07-28 NOTE — ED Notes (Signed)
Pt reporting dizziness and shakiness.  States she fell a week ago and since that time has had increase in chronic back pain.  Pt reporting increase in Lyrica dose from 75 mg to 100 mg bid.

## 2014-07-28 NOTE — ED Notes (Signed)
Dizziness since Saturday,    Fell 1 week ago when tripped , struck head on floor.  Had episode Saturday of rt hand being shakey, recent increase in lyrica dose.  Sl nausea, no vomiting.   Says she was "seeing  Double" on Saturday.

## 2014-07-29 NOTE — Discharge Instructions (Signed)

## 2014-08-25 ENCOUNTER — Encounter (HOSPITAL_COMMUNITY): Payer: Self-pay | Admitting: Emergency Medicine

## 2014-10-24 HISTORY — PX: BACK SURGERY: SHX140

## 2014-12-19 ENCOUNTER — Encounter: Payer: Self-pay | Admitting: Surgery

## 2014-12-22 ENCOUNTER — Ambulatory Visit (INDEPENDENT_AMBULATORY_CARE_PROVIDER_SITE_OTHER)
Admission: RE | Admit: 2014-12-22 | Discharge: 2014-12-22 | Disposition: A | Discharge status: Home / Self Care | Payer: BLUE CROSS/BLUE SHIELD | Source: Ambulatory Visit | Attending: Surgery | Admitting: Surgery

## 2014-12-22 ENCOUNTER — Ambulatory Visit (HOSPITAL_COMMUNITY)
Admission: RE | Admit: 2014-12-22 | Discharge: 2014-12-22 | Disposition: A | Discharge status: Home / Self Care | Payer: BLUE CROSS/BLUE SHIELD | Source: Ambulatory Visit | Attending: Surgery | Admitting: Surgery

## 2014-12-22 ENCOUNTER — Encounter: Payer: Self-pay | Admitting: Surgery

## 2014-12-22 ENCOUNTER — Ambulatory Visit (INDEPENDENT_AMBULATORY_CARE_PROVIDER_SITE_OTHER): Payer: BLUE CROSS/BLUE SHIELD | Admitting: Surgery

## 2014-12-22 VITALS — BP 151/80 | HR 62 | Resp 16 | Ht 64.0 in | Wt 147.0 lb

## 2014-12-22 DIAGNOSIS — M79609 Pain in unspecified limb: Secondary | ICD-10-CM | POA: Insufficient documentation

## 2014-12-22 DIAGNOSIS — R208 Other disturbances of skin sensation: Secondary | ICD-10-CM | POA: Insufficient documentation

## 2014-12-22 DIAGNOSIS — M79606 Pain in leg, unspecified: Secondary | ICD-10-CM

## 2014-12-22 DIAGNOSIS — I6523 Occlusion and stenosis of bilateral carotid arteries: Secondary | ICD-10-CM

## 2014-12-22 DIAGNOSIS — R202 Paresthesia of skin: Secondary | ICD-10-CM

## 2014-12-22 DIAGNOSIS — Z48812 Encounter for surgical aftercare following surgery on the circulatory system: Secondary | ICD-10-CM

## 2014-12-22 DIAGNOSIS — I739 Peripheral vascular disease, unspecified: Secondary | ICD-10-CM

## 2014-12-22 NOTE — Progress Notes (Signed)
Patient name: Ashlee Snow A Pickerel MRN: [id number] DOB: 03-29-1956 Sex: female     Chief Complaint  Patient presents with  . New Evaluation    C/O  Bilateral Leg pain, Right > Left, Right Thigh,Leg and foot pain, 1 yr.  Numbness Right foot > Left. 1 yr.    HISTORY OF PRESENT ILLNESS: The patient is back today for follow-up.  In September of 2012 she underwent attempted angioplasty of in-stent stenosis within her right common iliac artery. I was unsatisfied with the angioplasty results and therefore a covered stent, atrium 7 x 38 was placed with excellent results.  She complains of pain in her thigh as if somebody is squeezing a tourniquet around it.  She also complains of both feet becoming very cold.  The patient was recently admitted to the hospital for workup of chest pain.  She tells me that her heart workup was unremarkable.  Past Medical History  Diagnosis Date  . Hypertension   . Fibromyalgia   . Sciatica   . DDD (degenerative disc disease)   . Peripheral vascular disease   . Migraine   . Bronchitis   . 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 History of depression   . Leg pain   . Asthma   . Ulcer   . Thyroid disease   . Peripheral arterial disease     Past Surgical History  Procedure Laterality Date  . Cholecystectomy  01/22/1997    Gall Bladder  . Abdominal hysterectomy    . Ovarian cyst surgery    . Aortogram w/ stenting  2007, 2011, Aug. 2012  . Mandible fracture surgery      TMJ surgery - right side    History   Social History  . Marital Status: Married    Spouse Name: N/A  . Number of Children: N/A  . Years of Education: N/A   Occupational History  . Not on file.   Social History Main Topics  . Smoking status: Current Every Day Smoker -- 1.00 packs/day    Types: Cigarettes, E-cigarettes  . Smokeless tobacco: Never Used  . Alcohol Use: No  . Drug Use: No  . Sexual Activity: Not on file   Other Topics  Concern  . Not on file   Social History Narrative    Family History  Problem Relation Age of Onset  . Heart disease Mother   . Cancer Mother     lung  . Arthritis Mother   . Diabetes Mother   . Stroke Mother   . Hypertension Mother   . Heart disease Father   . Arthritis Father   . Heart failure Father   . Hypertension Father   . Stroke Father   . Peripheral vascular disease Father   . Heart disease Sister   . Arthritis Sister   . Kidney disease Sister   . Stroke Sister   . Fibromyalgia Sister     Allergies as of 12/22/2014  . (No Known Allergies)    Current Outpatient Prescriptions on File Prior to Visit  Medication Sig Dispense Refill  . aspirin EC 81 MG tablet Take 81 mg by mouth every morning.    Marland Kitchen. BLACK COHOSH PO Take 2 capsules by mouth 3 (three) times daily.     Marland Kitchen. levothyroxine (SYNTHROID, LEVOTHROID) 75 MCG tablet Take 75 mcg by mouth daily before breakfast.    . lisinopril (PRINIVIL,ZESTRIL) 20 MG tablet Take 20 mg by  mouth every morning.     . methocarbamol (ROBAXIN) 750 MG tablet Take 750 mg by mouth 3 (three) times daily.    . metoprolol succinate (TOPROL-XL) 25 MG 24 hr tablet Take 25 mg by mouth every morning.    . pravastatin (PRAVACHOL) 20 MG tablet Take 20 mg by mouth daily.    . hydrOXYzine (VISTARIL) 50 MG capsule Take 50 mg by mouth daily as needed for itching.     . Melatonin 3 MG CAPS Take 2-3 capsules by mouth at bedtime.    . meloxicam (MOBIC) 15 MG tablet Take 15 mg by mouth daily.    Marland Kitchen oxyCODONE-acetaminophen (PERCOCET) 10-325 MG per tablet Take 1 tablet by mouth 3 (three) times daily.     . pregabalin (LYRICA) 100 MG capsule Take 100 mg by mouth 2 (two) times daily.    Marland Kitchen senna-docusate (STOOL SOFTENER PLUS LAXATIVE) 8.6-50 MG per tablet Take 1 tablet by mouth daily.    Marland Kitchen triamcinolone cream (KENALOG) 0.1 % Apply 1 application topically daily. Applied to legs for itching     No current facility-administered medications on file prior to visit.       REVIEW OF SYSTEMS: Cardiovascular: No chest pain, chest pressure, palpitations, orthopnea, or dyspnea on exertion.  Positive for leg pain Pulmonary: No productive cough, asthma or wheezing. Neurologic: No weakness, paresthesias, aphasia, or amaurosis. No dizziness. Hematologic: No bleeding problems or clotting disorders. Musculoskeletal: No joint pain or joint swelling. Gastrointestinal: No blood in stool or hematemesis Genitourinary: No dysuria or hematuria. Psychiatric:: No history of major depression. Integumentary: No rashes or ulcers. Constitutional: No fever or chills.  PHYSICAL EXAMINATION:   Vital signs are BP 151/80 mmHg  Pulse 62  Resp 16  Ht  (1.626 m)  Wt 147 lb (66.679 kg)  BMI 25.22 kg/m2  SpO2 98% General: The patient appears their stated age. HEENT:  No gross abnormalities Pulmonary:  Non labored breathing Musculoskeletal: There are no major deformities. Neurologic: No focal weakness or paresthesias are detected, Skin: There are no ulcer or rashes noted. Psychiatric: The patient has normal affect. Cardiovascular: There is a regular rate and rhythm without significant murmur appreciated.  Palpable dorsalis pedis pulse bilaterally   Diagnostic Studies Duplex ultrasound today shows 40-59% right carotid stenosis and less than 40% left carotid stenosis.  ABI on the right is 0.96 and 1.02 on the left.  All with triphasic waveforms.  Her right-sided stent is widely patent  Assessment: #1: Claudication #2: Carotid stenosis Plan: #1: I discussed with the patient that I do not think her symptoms are related to her blood supply.  She has palpable pulses, normal ABIs and a widely patent stent.  I think her symptoms are possibly related to her back.  I've asked her to contact her primary care physician for referral to a back specialist.  She will follow up with me in one year with repeat ABIs and a duplex.  #2: The patient remained asymptomatic from a carotid  stenosis perspective.  She will have a repeat carotid ultrasound in 1 year.  Jorge Ny, M.D. [facility] Office: [phone] Pager:  [phone]

## 2014-12-22 NOTE — Addendum Note (Signed)
Addended by: Sharee PimpleMCCHESNEY, Ethen Bannan K on: 12/22/2014 04:39 PM   Modules accepted: Orders

## 2015-12-21 ENCOUNTER — Ambulatory Visit: Payer: BLUE CROSS/BLUE SHIELD | Admitting: Family

## 2015-12-21 ENCOUNTER — Encounter (HOSPITAL_COMMUNITY): Payer: BLUE CROSS/BLUE SHIELD

## 2016-05-25 ENCOUNTER — Encounter: Payer: Self-pay | Admitting: Internal Medicine

## 2016-05-27 ENCOUNTER — Encounter: Payer: Self-pay | Admitting: Gastroenterology

## 2016-05-27 ENCOUNTER — Ambulatory Visit (INDEPENDENT_AMBULATORY_CARE_PROVIDER_SITE_OTHER): Payer: BLUE CROSS/BLUE SHIELD | Admitting: Gastroenterology

## 2016-05-27 VITALS — BP 126/71 | HR 69 | Temp 97.2°F | Ht 64.0 in | Wt 141.2 lb

## 2016-05-27 DIAGNOSIS — R945 Abnormal results of liver function studies: Secondary | ICD-10-CM

## 2016-05-27 DIAGNOSIS — R197 Diarrhea, unspecified: Secondary | ICD-10-CM | POA: Diagnosis not present

## 2016-05-27 DIAGNOSIS — R7989 Other specified abnormal findings of blood chemistry: Secondary | ICD-10-CM

## 2016-05-27 MED ORDER — DICYCLOMINE HCL 10 MG PO CAPS: 10.0000 mg | ORAL_CAPSULE | Freq: Three times a day (TID) | ORAL | 3 refills | Status: DC

## 2016-05-27 NOTE — Progress Notes (Signed)
Primary Care Physician:  Monico Blitz, MD Primary Gastroenterologist:  Dr. Gala Romney   Chief Complaint  Patient presents with  . Diarrhea    HPI:   Ashlee Snow is a 60 y.o. female presenting today at the request of Dr. Manuella Ghazi secondary to diarrhea.   July 21st: Acute onset of diarrhea. Took multiple Imodium but didn't work. Went straight through her. The next day granddaughter was vomiting. Went to ED. Was prescribed Lomotil. Granddaughter got better. Patient was told she had a "virus". Having 10-11 stools per day. Green stool last night. No hematochezia. Associated abdominal cramping. No weight loss. Patient states she is gaining weight. Cut back on Neurontin to see if this would help. Ibuprofen for a headache.    Has baseline history of constipation. Has taken Linzess historically. [place] pain [facility].   Colonoscopy/EGD 2015: in [place]. Will request.   Past Medical History:  Diagnosis Date  . Asthma   . Bronchitis   . DDD (degenerative disc disease)   . Fibromyalgia   . History of depression   . Hypertension   . Leg pain   . Migraine   . Peripheral arterial disease ([place])   . Peripheral vascular disease ([place])   . Sciatica   . Thyroid disease   . 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 Ulcer     Past Surgical History:  Procedure Laterality Date  . ABDOMINAL HYSTERECTOMY    . aortogram w/ stenting  2007, 2011, Aug. 2012  . CHOLECYSTECTOMY  01/22/1997   Gall Bladder  . COLONOSCOPY WITH ESOPHAGOGASTRODUODENOSCOPY (EGD)  2010   Dr. Gala Romney: EGD with small hiatal hernia, small bowel biopsy negative, colonoscopy with hyperplastic polyp   . MANDIBLE FRACTURE SURGERY     TMJ surgery - right side  . OVARIAN CYST SURGERY      Current Outpatient Prescriptions  Medication Sig Dispense Refill  . aspirin EC 81 MG tablet Take 81 mg by mouth every morning.    Marland Kitchen BLACK COHOSH PO Take 2 capsules by mouth 3 (three) times daily.     .  cephALEXin (KEFLEX) 500 MG capsule 500 mg 3 (three) times daily.  0  . cloNIDine (CATAPRES) 0.1 MG tablet 0.1 mg daily.  1  . DULoxetine (CYMBALTA) 60 MG capsule Take 60 mg by mouth daily.    . ergocalciferol (VITAMIN D2) 50000 units capsule Take 50,000 Units by mouth once a week.    . gabapentin (NEURONTIN) 600 MG tablet 800 mg 2 (two) times daily.   4  . levothyroxine (SYNTHROID, LEVOTHROID) 75 MCG tablet Take 75 mcg by mouth daily before breakfast.    . lisinopril (PRINIVIL,ZESTRIL) 20 MG tablet Take 20 mg by mouth every morning.     . methocarbamol (ROBAXIN) 750 MG tablet Take 750 mg by mouth 3 (three) times daily.    . metoprolol succinate (TOPROL-XL) 25 MG 24 hr tablet Take 25 mg by mouth every morning.    Marland Kitchen oxyCODONE-acetaminophen (PERCOCET) 10-325 MG per tablet Take 1 tablet by mouth 3 (three) times daily.     Marland Kitchen dicyclomine (BENTYL) 10 MG capsule Take 1 capsule (10 mg total) by mouth 4 (four) times daily -  before meals and at bedtime. 120 capsule 3   No current facility-administered medications for this visit.     Allergies as of 05/27/2016 - Review Complete 05/27/2016  Allergen Reaction Noted  . Ativan [lorazepam]  05/27/2016    Family History  Problem Relation Age of Onset  . Heart disease Mother   . Cancer Mother     lung  . Arthritis Mother   . Diabetes Mother   . Stroke Mother   . Hypertension Mother   . Heart disease Father   . Arthritis Father   . Heart failure Father   . Hypertension Father   . Stroke Father   . Peripheral vascular disease Father   . Heart disease Sister   . Arthritis Sister   . Kidney disease Sister   . Stroke Sister   . Fibromyalgia Sister   . Colon cancer Neg Hx     Social History   Social History  . Marital status: Married    Spouse name: N/A  . Number of children: N/A  . Years of education: N/A   Occupational History  . Not on file.   Social History Main Topics  . Smoking status: Current Every Day Smoker    Packs/day: 1.00     Types: E-cigarettes  . Smokeless tobacco: Never Used  . Alcohol use No  . Drug use: No  . Sexual activity: Not on file   Other Topics Concern  . Not on file   Social History Narrative  . No narrative on file    Review of Systems: As mentioned in HPI   Physical Exam: BP 126/71   Pulse 69   Temp 97.2 F (36.2 C) (Oral)   Ht _0  (1.626 m)   Wt 141 lb 3.2 oz (64 kg)   BMI 24.24 kg/m  General:   Alert and oriented. Pleasant and cooperative. Well-nourished and well-developed.  Head:  Normocephalic and atraumatic. Eyes:  Without icterus, sclera clear and conjunctiva pink.  Ears:  Normal auditory acuity. Nose:  No deformity, discharge,  or lesions. Lungs:  Clear to auscultation bilaterally. No wheezes, rales, or rhonchi. No distress.  Heart:  S1, S2 present without murmurs appreciated.  Abdomen:  +BS, soft, non-tender and non-distended. No HSM noted. No guarding or rebound. No masses appreciated.  Rectal:  Deferred  Msk:  Symmetrical without gross deformities. Normal posture. Extremities:  Without edema. Neurologic:  Alert and  oriented x4;  grossly normal neurologically. Psych:  Alert and cooperative. Normal mood and affect.  Outside labs dated July 2017:  Hgb 13.8, Platelets 223, Alk Phos 172, AST 41.8, Tbili 0.2, ALT 88  CT from [facility] July 2017: fluid throughout the colon that can be seen with history of diarrhea. No acute process. Liver normal in size and contour. Spleen unremarkable.

## 2016-05-27 NOTE — Patient Instructions (Addendum)
Please complete the stool studies as soon as possible.  I have sent in a medication called Bentyl to take with meals and at bedtime for cramping and loose stool.  We will see what the stool studies show and go from there.  We will recheck your liver numbers in 3 months.   I will see you in 6 weeks.

## 2016-06-01 ENCOUNTER — Telehealth: Payer: Self-pay | Admitting: Internal Medicine

## 2016-06-01 NOTE — Telephone Encounter (Signed)
Pt called to say that she hasn't been able to do her stool kit because she hasn't had a BM in 2 weeks. She said once she is able to go to the bathroom to have a BM she will collect what is needed to take to the lab.

## 2016-06-01 NOTE — Telephone Encounter (Signed)
noted 

## 2016-06-05 ENCOUNTER — Encounter: Payer: Self-pay | Admitting: Gastroenterology

## 2016-06-05 DIAGNOSIS — R197 Diarrhea, unspecified: Secondary | ICD-10-CM | POA: Insufficient documentation

## 2016-06-05 NOTE — Assessment & Plan Note (Signed)
Elevated transaminases and Alk phos, liver normal on CT. Unknown baseline. In setting of what could have been acute viral illness. Will recheck at next visit. If continues to remain elevated, further serologies.

## 2016-06-05 NOTE — Assessment & Plan Note (Signed)
60 year old female presenting with diarrhea that appears to be likely post-infectious in nature. However, I do not have any recent stool studies. Will order this and provide support measures to include Bentyl for now. CT reviewed and without acute findings. Colonoscopy fairly recent per patient report (2015 at outside facility, will request records). Return in 6 weeks for close follow-up.

## 2016-06-06 NOTE — Progress Notes (Signed)
cc'ed to pcp °

## 2016-06-13 ENCOUNTER — Ambulatory Visit: Payer: BLUE CROSS/BLUE SHIELD | Admitting: Gastroenterology

## 2016-07-12 ENCOUNTER — Telehealth: Payer: Self-pay | Admitting: Internal Medicine

## 2016-07-12 NOTE — Telephone Encounter (Signed)
I have faxed a copy of the orders to the lab. They should be able to see these orders in their computer. The orders were just done in August.

## 2016-07-12 NOTE — Telephone Encounter (Signed)
Pt called to reschedule her OV from 9/25 to 10/25 and said that the lab told her that she needed a new lab order for her stool because the order they have was too old. I told her that I would let the nurse be aware and send another order to the lab.

## 2016-07-18 ENCOUNTER — Ambulatory Visit: Payer: BLUE CROSS/BLUE SHIELD | Admitting: Gastroenterology

## 2016-08-17 ENCOUNTER — Ambulatory Visit: Payer: BLUE CROSS/BLUE SHIELD | Admitting: Gastroenterology

## 2017-01-16 ENCOUNTER — Other Ambulatory Visit: Payer: Self-pay | Admitting: Surgery

## 2017-01-16 DIAGNOSIS — I739 Peripheral vascular disease, unspecified: Secondary | ICD-10-CM

## 2017-03-07 ENCOUNTER — Encounter: Payer: Self-pay | Admitting: Surgery

## 2017-03-13 ENCOUNTER — Encounter: Payer: Self-pay | Admitting: Surgery

## 2017-03-13 ENCOUNTER — Ambulatory Visit (HOSPITAL_COMMUNITY)
Admission: RE | Admit: 2017-03-13 | Discharge: 2017-03-13 | Disposition: A | Discharge status: Home / Self Care | Payer: BLUE CROSS/BLUE SHIELD | Source: Ambulatory Visit | Attending: Surgery | Admitting: Surgery

## 2017-03-13 ENCOUNTER — Ambulatory Visit (INDEPENDENT_AMBULATORY_CARE_PROVIDER_SITE_OTHER)
Admission: RE | Admit: 2017-03-13 | Discharge: 2017-03-13 | Disposition: A | Discharge status: Home / Self Care | Payer: BLUE CROSS/BLUE SHIELD | Source: Ambulatory Visit | Attending: Surgery | Admitting: Surgery

## 2017-03-13 ENCOUNTER — Ambulatory Visit (INDEPENDENT_AMBULATORY_CARE_PROVIDER_SITE_OTHER): Payer: BLUE CROSS/BLUE SHIELD | Admitting: Surgery

## 2017-03-13 VITALS — BP 126/70 | HR 52 | Temp 97.3°F | Resp 16 | Ht 64.0 in | Wt 141.0 lb

## 2017-03-13 DIAGNOSIS — I739 Peripheral vascular disease, unspecified: Secondary | ICD-10-CM

## 2017-03-13 DIAGNOSIS — R109 Unspecified abdominal pain: Secondary | ICD-10-CM

## 2017-03-13 DIAGNOSIS — I70211 Atherosclerosis of native arteries of extremities with intermittent claudication, right leg: Secondary | ICD-10-CM | POA: Diagnosis not present

## 2017-03-13 DIAGNOSIS — Z95820 Peripheral vascular angioplasty status with implants and grafts: Secondary | ICD-10-CM | POA: Insufficient documentation

## 2017-03-13 DIAGNOSIS — R140 Abdominal distension (gaseous): Secondary | ICD-10-CM | POA: Insufficient documentation

## 2017-03-13 DIAGNOSIS — R9439 Abnormal result of other cardiovascular function study: Secondary | ICD-10-CM | POA: Diagnosis not present

## 2017-03-13 NOTE — Progress Notes (Signed)
[facility]ist of [place]  Patient name: Ashlee Snow MRN: [id number] DOB: [birth year] Sex: female   REASON FOR VISIT:    Follow up  HISOTRY OF PRESENT ILLNESS:    Ashlee Snow is a 61 y.o. female who is here today for follow-up.  She initially underwent a right common iliac stent in 2007 by Dr. Madilyn Fireman for right leg claudication.  She had a recurrence of her symptoms in 2011 and underwent stenting of her right common and external iliac artery.  In 2012, she was found to have elevated velocities and decreased ankle-brachial indices and therefore she underwent angiography which showed a recurrent stenosis.  This was unresponsive to balloon angioplasty and therefore was treated with a covered stent.  She underwent surgery for her lower back as she was also having symptoms consistent with neuropathic back pain.  Since Christmas she has been limited to minimal activity.  She states she gets a pain in her right groin that radiates around to her back.  She also has early right leg giving out after taking just a few steps.  She does not have rest pain.  She does not have nonhealing wounds.   PAST MEDICAL HISTORY:   Past Medical History:  Diagnosis Date  . Asthma   . Bronchitis   . DDD (degenerative disc disease)   . Fibromyalgia   . History of depression   . Hypertension   . Leg pain   . Migraine   . Peripheral arterial disease (HCC)   . Peripheral vascular disease (HCC)   . Sciatica   . Thyroid disease   . 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 Ulcer      FAMILY HISTORY:   Family History  Problem Relation Age of Onset  . Heart disease Mother   . Cancer Mother        lung  . Arthritis Mother   . Diabetes Mother   . Stroke Mother   . Hypertension Mother   . Heart disease Father   . Arthritis Father   . Heart failure Father   . Hypertension Father   . Stroke Father   .  Peripheral vascular disease Father   . Heart disease Sister   . Arthritis Sister   . Kidney disease Sister   . Stroke Sister   . Fibromyalgia Sister   . Colon cancer Neg Hx     SOCIAL HISTORY:   Social History  Substance Use Topics  . Smoking status: Current Every Day Smoker    Packs/day: 1.00    Types: E-cigarettes  . Smokeless tobacco: Never Used     Comment: Vaper cigarettes "juice", 20mg .  Marland Kitchen Alcohol use No     ALLERGIES:   Allergies  Allergen Reactions  . Ativan [Lorazepam]      CURRENT MEDICATIONS:   Current Outpatient Prescriptions  Medication Sig Dispense Refill  . aspirin EC 81 MG tablet Take 81 mg by mouth every morning.    . ergocalciferol (VITAMIN D2) 50000 units capsule Take 50,000 Units by mouth once a week.    . levothyroxine (SYNTHROID, LEVOTHROID) 75 MCG tablet Take 75 mcg by mouth daily before breakfast.    . lisinopril (PRINIVIL,ZESTRIL) 20 MG tablet Take 20 mg by mouth every morning.     . methocarbamol (ROBAXIN) 750 MG tablet Take 750 mg by mouth 3 (three) times daily.    . metoprolol succinate (TOPROL-XL) 25 MG 24 hr tablet Take  25 mg by mouth every morning.    Marland Kitchen. oxyCODONE-acetaminophen (PERCOCET) 10-325 MG per tablet Take 1 tablet by mouth 3 (three) times daily.     Marland Kitchen. venlafaxine (EFFEXOR) 37.5 MG tablet Take 37.5 mg by mouth 2 (two) times daily.    Marland Kitchen. BLACK COHOSH PO Take 2 capsules by mouth 3 (three) times daily.     . cloNIDine (CATAPRES) 0.1 MG tablet 0.1 mg daily.  1  . dicyclomine (BENTYL) 10 MG capsule Take 1 capsule (10 mg total) by mouth 4 (four) times daily -  before meals and at bedtime. (Patient not taking: Reported on 03/13/2017) 120 capsule 3   No current facility-administered medications for this visit.     REVIEW OF SYSTEMS:   [X]  denotes positive finding, [ ]  denotes negative finding Cardiac  Comments:  Chest pain or chest pressure: x   Shortness of breath upon exertion: x   Short of breath when lying flat: x   Irregular  heart rhythm: x       Vascular    Pain in calf, thigh, or hip brought on by ambulation: x   Pain in feet at night that wakes you up from your sleep:  x   Blood clot in your veins:    Leg swelling:  x       Pulmonary    Oxygen at home:    Productive cough:     Wheezing:         Neurologic    Sudden weakness in arms or legs:  x   Sudden numbness in arms or legs:  x   Sudden onset of difficulty speaking or slurred speech:    Temporary loss of vision in one eye:     Problems with dizziness:  x       Gastrointestinal    Blood in stool:     Vomited blood:         Genitourinary    Burning when urinating:     Blood in urine:        Psychiatric    Major depression:         Hematologic    Bleeding problems:    Problems with blood clotting too easily:        Skin    Rashes or ulcers:        Constitutional    Fever or chills:      PHYSICAL EXAM:   Vitals:   03/13/17 1201  BP: 126/70  Pulse: (!) 52  Resp: 16  Temp: 97.3 F (36.3 C)  TempSrc: Oral  SpO2: 95%  Weight: 141 lb (64 kg)  Height: 5\' 4"  (1.626 m)    GENERAL: The patient is a well-nourished female, in no acute distress. The vital signs are documented above. CARDIAC: There is a regular rate and rhythm.  VASCULAR: I cannot palpate a right femoral pulse.  PULMONARY: Non-labored respirations ABDOMEN: Soft and non-tender with normal pitched bowel sounds.  MUSCULOSKELETAL: There are no major deformities or cyanosis. NEUROLOGIC: No focal weakness or paresthesias are detected. SKIN: There are no ulcers or rashes noted. PSYCHIATRIC: The patient has a normal affect.  STUDIES:   I have reviewed her vascular lab studies.  No flow was detected within her right iliac stent.  Her ABI is now 0.25.  Previously it was 0.96  MEDICAL ISSUES:   Right leg pain: I feel large component of her symptoms are related to her vascular disease.  She now has an occluded right iliac  stent and a ABI that has significantly decreased.   We discussed proceeding with angiography versus CT angiography to better define her lesion.  We elected to perform a CT angiogram of the abdomen and pelvis with bilateral runoff to evaluate her for surgical options for revascularization.  She would like to wait until high school is out and therefore I'm ordering the study within the next 3-4 weeks and she will see me afterwards.    Durene Cal, MD [facility] of [facility] [phone] Pager [phone]

## 2017-03-14 ENCOUNTER — Telehealth: Payer: Self-pay | Admitting: Surgery

## 2017-03-14 NOTE — Telephone Encounter (Signed)
Spoke w/ patient regarding the appointment Dr.Brabham requested for a CTA on 04/10/17 at 11:30am. They asked the patient to arrive at 11:10am and no solid food 4 hours prior. She is scheduled to see [facility] on the same day at 1:30pm here at [facility]. I will also mail the patient this information as well. awt

## 2017-03-14 NOTE — Addendum Note (Signed)
Addended by: Burton ApleyPETTY, Cregg Jutte A on: 03/14/2017 09:19 AM   Modules accepted: Orders

## 2017-03-15 ENCOUNTER — Encounter: Payer: Self-pay | Admitting: Emergency Medicine

## 2017-03-31 ENCOUNTER — Encounter: Payer: Self-pay | Admitting: Cardiology

## 2017-03-31 ENCOUNTER — Telehealth: Payer: Self-pay | Admitting: Cardiology

## 2017-03-31 ENCOUNTER — Encounter: Payer: Self-pay | Admitting: *Deleted

## 2017-03-31 ENCOUNTER — Ambulatory Visit (INDEPENDENT_AMBULATORY_CARE_PROVIDER_SITE_OTHER): Payer: BLUE CROSS/BLUE SHIELD | Admitting: Cardiology

## 2017-03-31 VITALS — BP 148/71 | HR 69 | Ht 64.0 in | Wt 141.0 lb

## 2017-03-31 DIAGNOSIS — I739 Peripheral vascular disease, unspecified: Secondary | ICD-10-CM

## 2017-03-31 DIAGNOSIS — R079 Chest pain, unspecified: Secondary | ICD-10-CM

## 2017-03-31 NOTE — Patient Instructions (Addendum)
Your physician recommends that you schedule a follow-up appointment in: TO BE DETERMINED WITH BRANCH   Your physician recommends that you continue on your current medications as directed. Please refer to the Current Medication list given to you today.  Your physician has requested that you have a lexiscan myoview. For further information please visit [email or web address]. Please follow instruction sheet, as given.  Thank you for choosing [facility]!!

## 2017-03-31 NOTE — Progress Notes (Addendum)
Clinical Summary Ashlee Snow is a 61 y.o.female seen as new patient. Last seen by Dr Diona Browner in 2007. Referred by Dr Sherryll Burger for chest pain.   1. Chest pain - 11/2005 cath without significant disease - strong family history of heart disease. Father with PAD  - admitted 02/2017 with chest pain. Started that 3AM. +SOB, pressure midchest. 10/10 in severity. Radiated left arm. Records are not avialable at this time.  - pressure lasted 3AM to 4pm, coming and going. Worst with deep breathing. - similar to prior chest pain in the past.     2. PAD - severe right iliac stenosis noted on cath 2007. S/p right CIA stent in 11/2005 - she reports multiple other peripheral stents in the past.  - CT angiogram pending with vascular.   Past Medical History:  Diagnosis Date  . Asthma   . Bronchitis   . DDD (degenerative disc disease)   . Fibromyalgia   . History of depression   . Hypertension   . Leg pain   . Migraine   . Peripheral arterial disease (HCC)   . Peripheral vascular disease (HCC)   . Sciatica   . Thyroid disease   . 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 Ulcer      Allergies  Allergen Reactions  . Ativan [Lorazepam]      Current Outpatient Prescriptions  Medication Sig Dispense Refill  . aspirin EC 81 MG tablet Take 81 mg by mouth every morning.    Marland Kitchen BLACK COHOSH PO Take 2 capsules by mouth 3 (three) times daily.     . cloNIDine (CATAPRES) 0.1 MG tablet 0.1 mg daily.  1  . dicyclomine (BENTYL) 10 MG capsule Take 1 capsule (10 mg total) by mouth 4 (four) times daily -  before meals and at bedtime. (Patient not taking: Reported on 03/13/2017) 120 capsule 3  . ergocalciferol (VITAMIN D2) 50000 units capsule Take 50,000 Units by mouth once a week.    . levothyroxine (SYNTHROID, LEVOTHROID) 75 MCG tablet Take 75 mcg by mouth daily before breakfast.    . lisinopril (PRINIVIL,ZESTRIL) 20 MG tablet Take 20 mg by mouth every morning.       . methocarbamol (ROBAXIN) 750 MG tablet Take 750 mg by mouth 3 (three) times daily.    . metoprolol succinate (TOPROL-XL) 25 MG 24 hr tablet Take 25 mg by mouth every morning.    Marland Kitchen oxyCODONE-acetaminophen (PERCOCET) 10-325 MG per tablet Take 1 tablet by mouth 3 (three) times daily.     . pravastatin (PRAVACHOL) 20 MG tablet Take 20 mg by mouth daily.    . Suvorexant (BELSOMRA) 5 MG TABS Take by mouth.    . venlafaxine (EFFEXOR) 37.5 MG tablet Take 37.5 mg by mouth 2 (two) times daily.     No current facility-administered medications for this visit.      Past Surgical History:  Procedure Laterality Date  . ABDOMINAL HYSTERECTOMY    . aortogram w/ stenting  2007, 2011, Aug. 2012  . CHOLECYSTECTOMY  01/22/1997   Gall Bladder  . COLONOSCOPY WITH ESOPHAGOGASTRODUODENOSCOPY (EGD)  2010   Dr. Jena Gauss: EGD with small hiatal hernia, small bowel biopsy negative, colonoscopy with hyperplastic polyp   . MANDIBLE FRACTURE SURGERY     TMJ surgery - right side  . OVARIAN CYST SURGERY       Allergies  Allergen Reactions  . Ativan [Lorazepam]       Family History  Problem Relation Age of Onset  . Heart disease Mother   . Cancer Mother        lung  . Arthritis Mother   . Diabetes Mother   . Stroke Mother   . Hypertension Mother   . Heart disease Father   . Arthritis Father   . Heart failure Father   . Hypertension Father   . Stroke Father   . Peripheral vascular disease Father   . Heart disease Sister   . Arthritis Sister   . Kidney disease Sister   . Stroke Sister   . Fibromyalgia Sister   . Colon cancer Neg Hx      Social History [name] reports that she has been smoking E-cigarettes.  She has been smoking about 1.00 pack per day. She has never used smokeless tobacco. [name] reports that she does not drink alcohol.   Review of Systems CONSTITUTIONAL: No weight loss, fever, chills, weakness or fatigue.  HEENT: Eyes: No visual loss, blurred vision, double vision or  yellow sclerae.No hearing loss, sneezing, congestion, runny nose or sore throat.  SKIN: No rash or itching.  CARDIOVASCULAR: per hpi RESPIRATORY: No shortness of breath, cough or sputum.  GASTROINTESTINAL: No anorexia, nausea, vomiting or diarrhea. No abdominal pain or blood.  GENITOURINARY: No burning on urination, no polyuria NEUROLOGICAL: No headache, dizziness, syncope, paralysis, ataxia, numbness or tingling in the extremities. No change in bowel or bladder control.  MUSCULOSKELETAL: No muscle, back pain, joint pain or stiffness.  LYMPHATICS: No enlarged nodes. No history of splenectomy.  PSYCHIATRIC: No history of depression or anxiety.  ENDOCRINOLOGIC: No reports of sweating, cold or heat intolerance. No polyuria or polydipsia.  Marland Kitchen.   Physical Examination Vitals:   03/31/17 1322 03/31/17 1336  BP: 124/76 (!) 148/71  Pulse: 70 69   Vitals:   03/31/17 1322  Weight: 141 lb (64 kg)  Height: 5\' 4"  (1.626 m)    Gen: resting comfortably, no acute distress HEENT: no scleral icterus, pupils equal round and reactive, no palptable cervical adenopathy,  CV: RRR, no m/r/g, no jvd Resp: Clear to auscultation bilaterally GI: abdomen is soft, non-tender, non-distended, normal bowel sounds, no hepatosplenomegaly MSK: extremities are warm, no edema.  Skin: warm, no rash Neuro:  no focal deficits Psych: appropriate affect   Diagnostic Studies 11/2005 cath RESULTS:  1.  HEMODYNAMICS:  LV 141/12, AO 149/75.  2.  CORONARIES:  The left main was short and normal. There was essentially      separate ostia. The LAD wrapped the apex. It was a somewhat narrow      caliber vessel. There was a 25% proximal plaque. There were first and      second diagonals, which were small and normal. The circumflex was      relatively a large vessel. In the AV groove, there was a mid 25%      stenosis. The first diagonal was very small and normal. The second      diagonal was moderate size and normal. The  third and fourth diagonals      were small and normal. There was a large posterolateral, which was      normal. The right coronary artery was a dominant, though not      particularly large vessel. It was normal throughout its course. There      was a PDA, which was small to moderate size and normal.  3.  LEFT VENTRICULOGRAM:  The left ventriculogram was obtained in the RAO  projection. The EF was approximately 60% with no regional wall motion      abnormalities.  4.  ABDOMINAL AORTOGRAM:  An abdominal aortogram was obtained. It      demonstrated patent renals. There was no significant aortic disease.      However, there was a sub-total stenosis of the proximal right iliac,      which was a rather discrete lesion. The left iliac was free of high-      grade disease.   CONCLUSION:  1.  Mild coronary plaque.  2.  Severe right iliac stenosis.  3.  Well preserved ejection fraction.   PLAN:  No further cardiac workup is suggested. I have discussed this with  Dr. Sherryll Burger, who will follow her for evaluation of non-anginal chest pain. I  also called Dr. Madilyn Fireman, who was due to see her for her peripheral vascular  disease. He will set up the next step in treating this.    Assessment and Plan  1. Chest pain - we will obtain lexican to further evaluate - request records from recent [facility] admisstion  2. PAD - further workup pending per vascular    Antoine Poche, M.D.   Addendum 04/12/17 Stress test is negative, recommend proceeding with surgery as planned.   Dominga Ferry MD

## 2017-03-31 NOTE — Telephone Encounter (Signed)
Pre-cert Verification for the following procedure    Lexiscan myoview scheduled for 04/19/17 at [facility]

## 2017-04-03 ENCOUNTER — Encounter: Payer: Self-pay | Admitting: Surgery

## 2017-04-10 ENCOUNTER — Ambulatory Visit: Payer: BLUE CROSS/BLUE SHIELD | Admitting: Surgery

## 2017-04-10 ENCOUNTER — Ambulatory Visit (INDEPENDENT_AMBULATORY_CARE_PROVIDER_SITE_OTHER): Payer: BLUE CROSS/BLUE SHIELD | Admitting: Surgery

## 2017-04-10 ENCOUNTER — Ambulatory Visit
Admission: RE | Admit: 2017-04-10 | Discharge: 2017-04-10 | Disposition: A | Discharge status: Home / Self Care | Payer: BLUE CROSS/BLUE SHIELD | Source: Ambulatory Visit | Attending: Surgery | Admitting: Surgery

## 2017-04-10 ENCOUNTER — Encounter (HOSPITAL_COMMUNITY): Payer: BLUE CROSS/BLUE SHIELD

## 2017-04-10 ENCOUNTER — Encounter: Payer: Self-pay | Admitting: Surgery

## 2017-04-10 ENCOUNTER — Telehealth: Payer: Self-pay | Admitting: Surgery

## 2017-04-10 VITALS — BP 136/82 | HR 98 | Temp 97.6°F | Resp 18 | Ht 64.0 in | Wt 144.0 lb

## 2017-04-10 DIAGNOSIS — R109 Unspecified abdominal pain: Secondary | ICD-10-CM

## 2017-04-10 DIAGNOSIS — I70211 Atherosclerosis of native arteries of extremities with intermittent claudication, right leg: Secondary | ICD-10-CM

## 2017-04-10 DIAGNOSIS — I70238 Atherosclerosis of native arteries of right leg with ulceration of other part of lower right leg: Secondary | ICD-10-CM | POA: Diagnosis not present

## 2017-04-10 MED ORDER — SULFAMETHOXAZOLE-TRIMETHOPRIM 400-80 MG PO TABS: 1.0000 | ORAL_TABLET | Freq: Two times a day (BID) | ORAL | 0 refills | Status: DC

## 2017-04-10 MED ORDER — IOPAMIDOL (ISOVUE-370) INJECTION 76%
125.0000 mL | Freq: Once | INTRAVENOUS | Status: AC | PRN
  Administered 2017-04-10: 125 mL via INTRAVENOUS

## 2017-04-10 NOTE — Addendum Note (Signed)
Addended by: Nada LibmanBRABHAM, VANCE W on: 04/10/2017 02:18 PM   Modules accepted: Orders

## 2017-04-10 NOTE — Telephone Encounter (Signed)
Faxed cardiac clearance form to Dr. Verna CzechBranch's office [facility] [phone]

## 2017-04-10 NOTE — Progress Notes (Signed)
Vascular and Vein Specialist of [place]  Patient name: Ashlee Snow MRN: [id number] DOB: 1956/04/08 Sex: female   REASON FOR VISIT:    Follow up  HISOTRY OF PRESENT ILLNESS:   Ashlee Snow is a 61 y.o. female who is here today for follow-up.  She initially underwent a right common iliac stent in 2007 by Dr. Madilyn Fireman for right leg claudication.  She had a recurrence of her symptoms in 2011 and underwent stenting of her right common and external iliac artery.  In 2012, she was found to have elevated velocities and decreased ankle-brachial indices and therefore she underwent angiography which showed a recurrent stenosis.  This was unresponsive to balloon angioplasty and therefore was treated with a covered stent.  She underwent surgery for her lower back as she was also having symptoms consistent with neuropathic back pain.  Since Christmas she has been limited to minimal activity.  She states she gets a pain in her right groin that radiates around to her back.  She also has early right leg giving out after taking just a few steps.  She recently fell and is complaining of right foot pain.  She also was picking at a ingrown toenail and somehow has created a wound from the bandage on the bottom of her right great toe.  She is having difficulty putting weight on her foot.  She was recently evaluated for chest pain.  She has a Myoview pending she takes a statin for hypercholesterolemia and is on ACE inhibitor for hypertension.  She is a current smoker.  There is a significant family history of cardiovascular disease.   PAST MEDICAL HISTORY:   Past Medical History:  Diagnosis Date  . Asthma   . Bronchitis   . DDD (degenerative disc disease)   . Fibromyalgia   . History of depression   . Hypertension   . Leg pain   . Migraine   . Peripheral arterial disease (HCC)   . Peripheral vascular disease (HCC)   . Sciatica   . Thyroid disease   . TMJ  (temporomandibular joint disorder) 08/24/1991   steal plate in right jaw  . Ulcer      FAMILY HISTORY:   Family History  Problem Relation Age of Onset  . Heart disease Mother   . Cancer Mother        lung  . Arthritis Mother   . Diabetes Mother   . Stroke Mother   . Hypertension Mother   . Heart disease Father   . Arthritis Father   . Heart failure Father   . Hypertension Father   . Stroke Father   . Peripheral vascular disease Father   . Heart disease Sister   . Arthritis Sister   . Kidney disease Sister   . Stroke Sister   . Fibromyalgia Sister   . Colon cancer Neg Hx     SOCIAL HISTORY:   Social History  Substance Use Topics  . Smoking status: Current Every Day Smoker    Packs/day: 1.00    Types: Cigarettes, E-cigarettes    Start date: 08/03/1975  . Smokeless tobacco: Never Used     Comment: Vaper cigarettes "juice", 20mg .  . Alcohol use No     ALLERGIES:   Allergies  Allergen Reactions  . Ativan [Lorazepam]      CURRENT MEDICATIONS:   Current Outpatient Prescriptions  Medication Sig Dispense Refill  . BELSOMRA 5 MG TABS TAKE 1 OR 2 TABLETS BY MOUTH AT BEDTIME FOR  persistent insomnia  1  . BLACK COHOSH PO Take 2 capsules by mouth 3 (three) times daily.     . ergocalciferol (VITAMIN D2) 50000 units capsule Take 50,000 Units by mouth once a week.    Marland Kitchen. HM ASPIRIN 81 MG chewable tablet Chew 162 mg by mouth daily.  1  . levothyroxine (SYNTHROID, LEVOTHROID) 75 MCG tablet Take 75 mcg by mouth daily before breakfast.    . lisinopril (PRINIVIL,ZESTRIL) 20 MG tablet Take 20 mg by mouth every morning.     Marland Kitchen. LYRICA 150 MG capsule Take 150 mg by mouth 2 (two) times daily.  1  . methocarbamol (ROBAXIN) 750 MG tablet Take 750 mg by mouth 4 (four) times daily.     . metoprolol succinate (TOPROL-XL) 25 MG 24 hr tablet Take 25 mg by mouth every morning.    Marland Kitchen. oxyCODONE-acetaminophen (PERCOCET) 10-325 MG per tablet Take 1 tablet by mouth 3 (three) times daily as  needed.     . pravastatin (PRAVACHOL) 20 MG tablet Take 20 mg by mouth daily.    . promethazine (PHENERGAN) 25 MG tablet Take 1 tablet by mouth every 6 (six) hours as needed.  2  . venlafaxine XR (EFFEXOR-XR) 75 MG 24 hr capsule Take 75 mg by mouth every morning.  1   No current facility-administered medications for this visit.     REVIEW OF SYSTEMS:   [X]  denotes positive finding, [ ]  denotes negative finding Cardiac  Comments:  Chest pain or chest pressure: x   Shortness of breath upon exertion:    Short of breath when lying flat:    Irregular heart rhythm:        Vascular    Pain in calf, thigh, or hip brought on by ambulation: x   Pain in feet at night that wakes you up from your sleep:  x   Blood clot in your veins:    Leg swelling:  xx       Pulmonary    Oxygen at home:    Productive cough:     Wheezing:         Neurologic    Sudden weakness in arms or legs:     Sudden numbness in arms or legs:     Sudden onset of difficulty speaking or slurred speech:    Temporary loss of vision in one eye:     Problems with dizziness:         Gastrointestinal    Blood in stool:     Vomited blood:         Genitourinary    Burning when urinating:     Blood in urine:        Psychiatric    Major depression:         Hematologic    Bleeding problems:    Problems with blood clotting too easily:        Skin    Rashes or ulcers:        Constitutional    Fever or chills:      PHYSICAL EXAM:   Vitals:   04/10/17 1336  BP: 136/82  Pulse: 98  Resp: 18  Temp: 97.6 F (36.4 C)  TempSrc: Oral  SpO2: 98%  Weight: 144 lb (65.3 kg)  Height: 5\' 4"  (1.626 m)    GENERAL: The patient is a well-nourished female, in no acute distress. The vital signs are documented above. CARDIAC: There is a regular rate and rhythm.  VASCULAR: Nonpalpable pedal pulse  on the right PULMONARY: Non-labored respirations ABDOMEN: Soft and non-tender with normal pitched bowel sounds.    MUSCULOSKELETAL: There are no major deformities or cyanosis. NEUROLOGIC: No focal weakness or paresthesias are detected. SKIN: Open wound on the plantar surface of the right great toe. PSYCHIATRIC: The patient has a normal affect.  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 The right common femoral artery reconstitutes with 3 vessel runoff.  Bilateral internal iliac artery occlusion. Branch vessels reconstitute bilaterally  Left common and external iliac arteries patent.  Left femoral and popliteal arteries are patent. Three vessel runoff to the left ankle.  Significant narrowing of the left renal artery. Correlate with a history of uncontrolled hypertension.  NON-VASCULAR  No acute intra-abdominal process.  MEDICAL ISSUES:   Right great toe wound: I discussed with the patient and her husband that this is a limb threatening situation.  She has a right iliac occlusion.  I presented to surgical options.  We discussed an aortobifemoral bypass graft and a left-to-right femoral-femoral bypass graft.  We have decided to proceed with an aortobifemoral bypass graft.  The risks and benefits of the operation were discussed with the patient including the risk of death, renal dysfunction, intestinal ischemia, lower extremity ischemia, wound complications and cardiac issues.  The patient is undergoing a cardiac workup currently.  I will try to expedite this so that we can proceed with surgery on Friday.  I am starting her on antibiotics today for some slight skin and color changes around her ulcer.    Durene Cal, MD [facility] of [facility] [phone] Pager [phone]

## 2017-04-11 ENCOUNTER — Other Ambulatory Visit: Payer: Self-pay

## 2017-04-12 ENCOUNTER — Ambulatory Visit (HOSPITAL_COMMUNITY)
Admission: RE | Admit: 2017-04-12 | Discharge: 2017-04-12 | Disposition: A | Discharge status: Home / Self Care | Payer: BLUE CROSS/BLUE SHIELD | Source: Ambulatory Visit | Attending: Cardiology | Admitting: Cardiology

## 2017-04-12 ENCOUNTER — Encounter (HOSPITAL_COMMUNITY)
Admission: RE | Admit: 2017-04-12 | Discharge: 2017-04-12 | Disposition: A | Discharge status: Home / Self Care | Payer: BLUE CROSS/BLUE SHIELD | Source: Ambulatory Visit | Attending: Cardiology | Admitting: Cardiology

## 2017-04-12 ENCOUNTER — Encounter: Payer: Self-pay | Admitting: Cardiology

## 2017-04-12 ENCOUNTER — Emergency Department (HOSPITAL_COMMUNITY)
Admission: EM | Admit: 2017-04-12 | Discharge: 2017-04-12 | Disposition: A | Discharge status: Home / Self Care | Payer: BLUE CROSS/BLUE SHIELD | Attending: Emergency Medicine | Admitting: Emergency Medicine

## 2017-04-12 ENCOUNTER — Other Ambulatory Visit (HOSPITAL_COMMUNITY): Payer: Self-pay | Admitting: Neurology

## 2017-04-12 ENCOUNTER — Encounter (HOSPITAL_COMMUNITY): Payer: Self-pay | Admitting: Emergency Medicine

## 2017-04-12 ENCOUNTER — Encounter (HOSPITAL_COMMUNITY): Payer: Self-pay

## 2017-04-12 ENCOUNTER — Ambulatory Visit (HOSPITAL_COMMUNITY)
Admission: RE | Admit: 2017-04-12 | Discharge: 2017-04-12 | Disposition: A | Discharge status: Home / Self Care | Payer: BLUE CROSS/BLUE SHIELD | Source: Ambulatory Visit | Attending: Neurology | Admitting: Neurology

## 2017-04-12 DIAGNOSIS — Y999 Unspecified external cause status: Secondary | ICD-10-CM | POA: Diagnosis not present

## 2017-04-12 DIAGNOSIS — W19XXXA Unspecified fall, initial encounter: Secondary | ICD-10-CM | POA: Insufficient documentation

## 2017-04-12 DIAGNOSIS — Z7982 Long term (current) use of aspirin: Secondary | ICD-10-CM | POA: Insufficient documentation

## 2017-04-12 DIAGNOSIS — Y939 Activity, unspecified: Secondary | ICD-10-CM | POA: Diagnosis not present

## 2017-04-12 DIAGNOSIS — J45909 Unspecified asthma, uncomplicated: Secondary | ICD-10-CM | POA: Insufficient documentation

## 2017-04-12 DIAGNOSIS — Z79899 Other long term (current) drug therapy: Secondary | ICD-10-CM | POA: Insufficient documentation

## 2017-04-12 DIAGNOSIS — S93601A Unspecified sprain of right foot, initial encounter: Secondary | ICD-10-CM

## 2017-04-12 DIAGNOSIS — M79671 Pain in right foot: Secondary | ICD-10-CM | POA: Diagnosis not present

## 2017-04-12 DIAGNOSIS — F17290 Nicotine dependence, other tobacco product, uncomplicated: Secondary | ICD-10-CM | POA: Insufficient documentation

## 2017-04-12 DIAGNOSIS — M7989 Other specified soft tissue disorders: Secondary | ICD-10-CM | POA: Insufficient documentation

## 2017-04-12 DIAGNOSIS — R079 Chest pain, unspecified: Secondary | ICD-10-CM | POA: Insufficient documentation

## 2017-04-12 DIAGNOSIS — S93611A Sprain of tarsal ligament of right foot, initial encounter: Secondary | ICD-10-CM | POA: Insufficient documentation

## 2017-04-12 DIAGNOSIS — R52 Pain, unspecified: Secondary | ICD-10-CM

## 2017-04-12 DIAGNOSIS — Y929 Unspecified place or not applicable: Secondary | ICD-10-CM | POA: Diagnosis not present

## 2017-04-12 DIAGNOSIS — S99921A Unspecified injury of right foot, initial encounter: Secondary | ICD-10-CM | POA: Diagnosis present

## 2017-04-12 DIAGNOSIS — S90451A Superficial foreign body, right great toe, initial encounter: Secondary | ICD-10-CM | POA: Diagnosis not present

## 2017-04-12 DIAGNOSIS — I10 Essential (primary) hypertension: Secondary | ICD-10-CM | POA: Diagnosis not present

## 2017-04-12 DIAGNOSIS — Z7902 Long term (current) use of antithrombotics/antiplatelets: Secondary | ICD-10-CM | POA: Diagnosis not present

## 2017-04-12 LAB — NM MYOCAR MULTI W/SPECT W/WALL MOTION / EF
CHL CUP NUCLEAR SSS: 0
LHR: 0.4
LVDIAVOL: 59 mL (ref 46–106)
LVSYSVOL: 15 mL
NUC STRESS TID: 1.2
Peak HR: 81 {beats}/min
Rest HR: 66 {beats}/min
SDS: 0
SRS: 0

## 2017-04-12 MED ORDER — SODIUM CHLORIDE 0.9% FLUSH
INTRAVENOUS | Status: AC
  Administered 2017-04-12: 10 mL via INTRAVENOUS
  Filled 2017-04-12: qty 10

## 2017-04-12 MED ORDER — TECHNETIUM TC 99M TETROFOSMIN IV KIT
10.0000 | PACK | Freq: Once | INTRAVENOUS | Status: AC | PRN
  Administered 2017-04-12: 10.7 via INTRAVENOUS

## 2017-04-12 MED ORDER — REGADENOSON 0.4 MG/5ML IV SOLN
INTRAVENOUS | Status: AC
  Administered 2017-04-12: 0.4 mg via INTRAVENOUS
  Filled 2017-04-12: qty 5

## 2017-04-12 MED ORDER — TECHNETIUM TC 99M TETROFOSMIN IV KIT
30.0000 | PACK | Freq: Once | INTRAVENOUS | Status: AC | PRN
  Administered 2017-04-12: 33 via INTRAVENOUS

## 2017-04-12 NOTE — ED Triage Notes (Signed)
Patient c/o wound to right fooot. Per patient had ingrown toenail in which she started wearing a "finger splint on it" to prevent from bumping toe. Patient states metal part off splint cut foot between bottom of great toe and foot. Patient reports swelling to toe and upper part of foot. No swelling noted at this time. Denies any fevers. Patient states being seen by Dr Kennedy BuckerGrant and given SMZ/ TMP DS [id number]. Patient states she is going to have vascular surgery this next week. Patient states she has only 25% of blood flow in right leg. Per patient has had x-ray of foot today through outpatient but does not have results back yet.

## 2017-04-12 NOTE — Pre-Procedure Instructions (Signed)
Leane ParaDebra A Harner  04/12/2017      [facility] - [place], [place] - [address] [place] [id number] Phone: [phone] Fax: [phone]    Your procedure is scheduled on  Friday  04/14/17   [facility] Ad[facility] at 530 A.M.  Call this number if you have problems the morning of surgery:  [phone]   Remember:  Do not eat food or drink liquids after midnight.  Take these medicines the morning of surgery with A SIP OF WATER   LEVOTHYROXINE (SYNTHROID), LYRICA, METOPROLOL (TOPROL), OXYCODONE IF NEEDED, VENLAFAXINE (EFFEXOR)  7 days prior to surgery STOP taking any Aspirin, Aleve, Naproxen, Ibuprofen, Motrin, Advil, Goody's, BC's, all herbal medications, fish oil, and all vitamins, BLACK COHOSH   Do not wear jewelry, make-up or nail polish.  Do not wear lotions, powders, or perfumes, or deoderant.  Do not shave 48 hours prior to surgery.  Men may shave face and neck.  Do not bring valuables to the hospital.  [facility] is not responsible for any belongings or valuables.  Contacts, dentures or bridgework may not be worn into surgery.  Leave your suitcase in the car.  After surgery it may be brought to your room.  For patients admitted to the hospital, discharge time will be determined by your treatment team.  Patients discharged the day of surgery will not be allowed to drive home.   Name and phone number of your driver:    Special instructions:  [place] - Preparing for Surgery  Before surgery, you can play an important role.  Because skin is not sterile, your skin needs to be as free of germs as possible.  You can reduce the number of germs on you skin by washing with CHG (chlorahexidine gluconate) soap before surgery.  CHG is an antiseptic cleaner which kills germs and bonds with the skin to continue killing germs even after washing.  Please DO NOT use if you have an allergy to CHG or antibacterial soaps.  If your skin  becomes reddened/irritated stop using the CHG and inform your nurse when you arrive at Short Stay.  Do not shave (including legs and underarms) for at least 48 hours prior to the first CHG shower.  You may shave your face.  Please follow these instructions carefully:   1.  Shower with CHG Soap the night before surgery and the                                morning of Surgery.  2.  If you choose to wash your hair, wash your hair first as usual with your       normal shampoo.  3.  After you shampoo, rinse your hair and body thoroughly to remove the                      Shampoo.  4.  Use CHG as you would any other liquid soap.  You can apply chg directly       to the skin and wash gently with scrungie or a clean washcloth.  5.  Apply the CHG Soap to your body ONLY FROM THE NECK DOWN.        Do not use on open wounds or open sores.  Avoid contact with your eyes,       ears, mouth  and genitals (private parts).  Wash genitals (private parts)       with your normal soap.  6.  Wash thoroughly, paying special attention to the area where your surgery        will be performed.  7.  Thoroughly rinse your body with warm water from the neck down.  8.  DO NOT shower/wash with your normal soap after using and rinsing off       the CHG Soap.  9.  Pat yourself dry with a clean towel.            10.  Wear clean pajamas.            11.  Place clean sheets on your bed the night of your first shower and do not        sleep with pets.  Day of Surgery  Do not apply any lotions/deoderants the morning of surgery.  Please wear clean clothes to the hospital/surgery center.    Please read over the following fact sheets that you were given. Pain Booklet, MRSA Information and Surgical Site Infection Prevention

## 2017-04-12 NOTE — ED Notes (Signed)
Patient states she was sent here for xrays of right foot by her doctor. Patient says that she came here because she didn't want to drive home and wait for results.

## 2017-04-12 NOTE — ED Provider Notes (Signed)
[facility]-EM[facility]ENCY DEPT Provider Note   CSN: [id number] Arrival date & time: 04/12/17  1106     History   Chief Complaint Chief Complaint  Patient presents with  . Wound Check    HPI Ashlee Snow is a 61 y.o. female.  HPI  61 year old female presents with concern of a possible broken foot. She states about a week ago she fell and injured her right foot. She has noticed swelling, and pain mostly in between her distal second and third metatarsals. She also has a wound on the plantar aspect of her right great toe that has been there since she put a finger splint on her toe to help protect it after taking out an ingrown nail. This is been over the last 2 months. The wound seems to be low but better and she is currently on Bactrim from her vascular surgeon. No drainage or redness. She saw her neurologist today for a regular appointment and was telling them about the foot and so they sent her for an x-ray. However after the x-ray she wanted the results and wanted to know if that was broken so she came to the ER.  Past Medical History:  Diagnosis Date  . Asthma   . Bronchitis   . DDD (degenerative disc disease)   . Fibromyalgia   . History of depression   . Hypertension   . Leg pain   . Migraine   . Peripheral arterial disease (HCC)   . Peripheral vascular disease (HCC)   . Sciatica   . Thyroid disease   . 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 Ulcer     Patient Active Problem List   Diagnosis Date Noted  . Diarrhea 06/05/2016  . Elevated LFTs 05/27/2016  . Burning sensation of the foot 12/22/2014  . Pain in limb 12/22/2014  . Peripheral vascular disease, unspecified (HCC) 02/06/2012  . WEIGHT LOSS 07/02/2009  . ABDOMINAL PAIN 07/02/2009  . Personal history of other diseases of digestive system 07/01/2009    Past Surgical History:  Procedure Laterality Date  . ABDOMINAL HYSTERECTOMY    . aortogram w/ stenting  2007, 2011, Aug. 2012   . CHOLECYSTECTOMY  01/22/1997   Gall Bladder  . COLONOSCOPY WITH ESOPHAGOGASTRODUODENOSCOPY ([facility])  2010   Dr. Jena Gauss: EGD with small hiatal hernia, small bowel biopsy negative, colonoscopy with hyperplastic polyp   . MANDIBLE FRACTURE SURGERY     TMJ surgery - right side  . OVARIAN CYST SURGERY      OB History    Gravida Para Term Preterm AB Living   2 2 2     2    SAB TAB Ectopic Multiple Live Births                   Home Medications    Prior to Admission medications   Medication Sig Start Date End Date Taking? Authorizing Provider  BLACK COHOSH PO Take 3 capsules by mouth daily.    Yes [provider]  ergocalciferol (VITAMIN D2) 50000 units capsule Take 50,000 Units by mouth once a week. Monday   Yes [provider]  HM ASPIRIN 81 MG chewable tablet Chew 81 mg by mouth 2 (two) times daily.  03/17/17  Yes [provider]  levothyroxine (SYNTHROID, LEVOTHROID) 75 MCG tablet Take 75 mcg by mouth daily before breakfast.   Yes [provider]  lisinopril (PRINIVIL,ZESTRIL) 20 MG tablet Take 20 mg by mouth every morning.  Yes [provider]  LYRICA 150 MG capsule Take 150 mg by mouth 2 (two) times daily. 03/17/17  Yes [provider]  methocarbamol (ROBAXIN) 750 MG tablet Take 750 mg by mouth 3 (three) times daily.  12/25/13  Yes [provider]  metoprolol succinate (TOPROL-XL) 25 MG 24 hr tablet Take 25 mg by mouth every morning.   Yes [provider]  oxyCODONE (ROXICODONE) 15 MG immediate release tablet Take 15 mg by mouth every 8 (eight) hours as needed for pain.   Yes [provider]  oxyCODONE-acetaminophen (PERCOCET) 10-325 MG per tablet Take 1 tablet by mouth 3 (three) times daily.    Yes [provider]  pravastatin (PRAVACHOL) 20 MG tablet Take 20 mg by mouth every morning.    Yes [provider]  promethazine (PHENERGAN) 25 MG tablet Take 1 tablet by mouth every 6 (six) hours as  needed. 03/17/17  Yes [provider]  sulfamethoxazole-trimethoprim (BACTRIM,SEPTRA) 400-80 MG tablet Take 1 tablet by mouth 2 (two) times daily. Patient taking differently: Take 2 tablets by mouth 2 (two) times daily.  04/10/17  Yes Nada Libman, MD  venlafaxine XR (EFFEXOR-XR) 75 MG 24 hr capsule Take 75 mg by mouth every morning. 03/17/17  Yes [provider]    Family History Family History  Problem Relation Age of Onset  . Heart disease Mother   . Cancer Mother        lung  . Arthritis Mother   . Diabetes Mother   . Stroke Mother   . Hypertension Mother   . Heart disease Father   . Arthritis Father   . Heart failure Father   . Hypertension Father   . Stroke Father   . Peripheral vascular disease Father   . Heart disease Sister   . Arthritis Sister   . Kidney disease Sister   . Stroke Sister   . Fibromyalgia Sister   . Colon cancer Neg Hx     Social History Social History  Substance Use Topics  . Smoking status: Current Every Day Smoker    Packs/day: 1.00    Types: Cigarettes, E-cigarettes    Start date: 08/03/1975  . Smokeless tobacco: Never Used     Comment: Vaper cigarettes "juice", 20mg .  . Alcohol use No     Allergies   Ativan [lorazepam]   Review of Systems Review of Systems  Musculoskeletal: Positive for arthralgias and joint swelling.  Skin: Positive for wound. Negative for color change.  All other systems reviewed and are negative.    Physical Exam Updated Vital Signs BP (!) 153/88 (BP Location: Left Arm)   Pulse 78   Temp 98.1 F (36.7 C) (Oral)   Resp 18   Ht 5\' 4"  (1.626 m)   Wt 65.3 kg (144 lb)   SpO2 98%   BMI 24.72 kg/m   Physical Exam  Constitutional: She is oriented to person, place, and time. She appears well-developed and well-nourished.  HENT:  Head: Normocephalic and atraumatic.  Right Ear: External ear normal.  Left Ear: External ear normal.  Nose: Nose normal.  Eyes: Right eye exhibits no  discharge. Left eye exhibits no discharge.  Cardiovascular: Normal rate.   Pulmonary/Chest: Effort normal.  Musculoskeletal:       Right foot: There is swelling (mild, distal).       Feet:  Feet:  Right Foot:  Skin Integrity: Negative for erythema or warmth.  Neurological: She is alert and oriented to person, place, and  time.  Skin: Skin is warm and dry.  Nursing note and vitals reviewed.    ED Treatments / Results  Labs (all labs ordered are listed, but only abnormal results are displayed) Labs Reviewed - No data to display  EKG  EKG Interpretation None       Radiology Nm Myocar Multi W/spect W/wall Motion / Ef  Result Date: 04/12/2017  There was no ST segment deviation noted during stress.  The study is normal. There are no perfusion defects  This is a low risk study.  The left ventricular ejection fraction is hyperdynamic (>65%).    Dg Foot Complete Right  Result Date: 04/12/2017 CLINICAL DATA:  Ingrown toenail. EXAM: RIGHT FOOT COMPLETE - 3+ VIEW COMPARISON:  No recent prior. FINDINGS: Metallic foreign bodies in the distal right great toe soft tissues. No underlying bony abnormality identified. No acute bony abnormality identified. No evidence of fracture dislocation. IMPRESSION: Small metallic foreign bodies noted in the distal medial soft tissues of the right great toe. No acute bony abnormality identified. Electronically Signed   By: Maisie Fushomas  Register   On: 04/12/2017 11:17    Procedures Procedures (including critical care time)  Medications Ordered in ED Medications - No data to display   Initial Impression / Assessment and Plan / ED Course  I have reviewed the triage vital signs and the nursing notes.  Pertinent labs & imaging results that were available during my care of the patient were reviewed by me and considered in my medical decision making (see chart for details).     Patient's x-ray was reviewed with the patient and shows no fracture. There are  small foreign metallic bodies at the right great toe that I discussed with patient but she currently has no pain and does not any injuries there. There are no open wounds there. This is likely a chronic finding and no indication to remove at this time. The place where she is sore looks unremarkable on my view and the radiologist interpretation. Given wanting to keep her off of her wound given her poor vascular flow, we'll place in a postop shoe to help with both the sprain as well as the chronic wound. Follow-up with her vascular surgeon in 2 days for her already planned aorto-femoral bypass.  Final Clinical Impressions(s) / ED Diagnoses   Final diagnoses:  Sprain of right foot, initial encounter    New Prescriptions New Prescriptions   No medications on file     Pricilla LovelessGoldston, Laquana Villari, MD 04/12/17 1237

## 2017-04-13 ENCOUNTER — Encounter (HOSPITAL_COMMUNITY)
Admission: RE | Admit: 2017-04-13 | Discharge: 2017-04-13 | Disposition: A | Discharge status: Home / Self Care | Payer: BLUE CROSS/BLUE SHIELD | Source: Ambulatory Visit | Attending: Surgery | Admitting: Surgery

## 2017-04-13 ENCOUNTER — Encounter (HOSPITAL_COMMUNITY): Payer: BLUE CROSS/BLUE SHIELD

## 2017-04-13 ENCOUNTER — Encounter (HOSPITAL_COMMUNITY): Payer: Self-pay

## 2017-04-13 HISTORY — DX: Gastro-esophageal reflux disease without esophagitis: K21.9

## 2017-04-13 HISTORY — DX: Major depressive disorder, single episode, unspecified: F32.9

## 2017-04-13 HISTORY — DX: Depression, unspecified: F32.A

## 2017-04-13 HISTORY — DX: Dyspnea, unspecified: R06.00

## 2017-04-13 HISTORY — DX: Hypothyroidism, unspecified: E03.9

## 2017-04-13 LAB — CBC
HEMATOCRIT: 37.9 % (ref 36.0–46.0)
Hemoglobin: 12.6 g/dL (ref 12.0–15.0)
MCH: 29.5 pg (ref 26.0–34.0)
MCHC: 33.2 g/dL (ref 30.0–36.0)
MCV: 88.8 fL (ref 78.0–100.0)
Platelets: 201 10*3/uL (ref 150–400)
RBC: 4.27 MIL/uL (ref 3.87–5.11)
RDW: 13.3 % (ref 11.5–15.5)
WBC: 7.2 10*3/uL (ref 4.0–10.5)

## 2017-04-13 LAB — COMPREHENSIVE METABOLIC PANEL
ALT: 34 U/L (ref 14–54)
ANION GAP: 8 (ref 5–15)
AST: 25 U/L (ref 15–41)
Albumin: 4 g/dL (ref 3.5–5.0)
Alkaline Phosphatase: 103 U/L (ref 38–126)
BILIRUBIN TOTAL: 0.4 mg/dL (ref 0.3–1.2)
BUN: 18 mg/dL (ref 6–20)
CO2: 22 mmol/L (ref 22–32)
Calcium: 9.3 mg/dL (ref 8.9–10.3)
Chloride: 106 mmol/L (ref 101–111)
Creatinine, Ser: 1.18 mg/dL — ABNORMAL HIGH (ref 0.44–1.00)
GFR, EST AFRICAN AMERICAN: 58 mL/min — AB (ref >60)
GFR, EST NON AFRICAN AMERICAN: 50 mL/min — AB (ref >60)
Glucose, Bld: 118 mg/dL — ABNORMAL HIGH (ref 65–99)
POTASSIUM: 4.2 mmol/L (ref 3.5–5.1)
Sodium: 136 mmol/L (ref 135–145)
TOTAL PROTEIN: 6.9 g/dL (ref 6.5–8.1)

## 2017-04-13 LAB — URINALYSIS, ROUTINE W REFLEX MICROSCOPIC
BACTERIA UA: NONE SEEN
GLUCOSE, UA: NEGATIVE mg/dL
HGB URINE DIPSTICK: NEGATIVE
KETONES UR: NEGATIVE mg/dL
Nitrite: NEGATIVE
PROTEIN: NEGATIVE mg/dL
Specific Gravity, Urine: 1.026 (ref 1.005–1.030)
pH: 5 (ref 5.0–8.0)

## 2017-04-13 LAB — SURGICAL PCR SCREEN
MRSA, PCR: NEGATIVE
Staphylococcus aureus: POSITIVE — AB

## 2017-04-13 LAB — ABO/RH: ABO/RH(D): O POS

## 2017-04-13 LAB — APTT: APTT: 28 s (ref 24–36)

## 2017-04-13 LAB — PROTIME-INR
INR: 1.02
PROTHROMBIN TIME: 13.4 s (ref 11.4–15.2)

## 2017-04-13 MED ORDER — DEXTROSE 5 % IV SOLN
1.5000 g | INTRAVENOUS | Status: AC
  Administered 2017-04-14: 1.5 g via INTRAVENOUS
  Filled 2017-04-13: qty 1.5

## 2017-04-13 NOTE — Progress Notes (Signed)
Anesthesia Chart Review: Patient is a 61 year old female scheduled for AFBG 04/14/17 by Dr. Myra GianottiBrabham.  History includes smoking, hypertension, fibromyalgia, PVD s/p right CIA PTCA/stent 12/16/05 s/p right CIA and right EIA stent 10/28/09, migraines, depression, asthma, hypothyroidism, exertional dyspnea, GERD, right TMJ, cholecystectomy, hysterectomy, mandibular fracture surgery, colon surgery '82, back surgery '16.    - PCP is Dr. Kirstie PeriAshish Shah in [place]. - Cardiologist is Dr. Dina RichJonathan Branch with [facility] [place], seen 03/31/17 for evaluation of chest pain with admission ([facility]) 02/2017. She was cleared for surgery following low risk stress test.  Meds include black cohosh, aspirin 81 mg, levothyroxine, lisinopril, Lyrica, Robaxin, Toprol-XL, oxycodone IR, Percocet, pravastatin, Bactrim, Effexor XR.  BP (!) 149/70   Pulse 67   Temp 36.4 C   Resp 18   Ht 5\' 4"  (1.626 m)   Wt 144 lb (65.3 kg)   SpO2 94%   BMI 24.72 kg/m    EKG 03/15/17: SR, probable LAE, low voltage, precordial leads.   Nuclear stress test 04/12/17:  There was no ST segment deviation noted during stress.  The study is normal. There are no perfusion defects  This is a low risk study.  The left ventricular ejection fraction is hyperdynamic (>65%).  Cardiac cath 12/01/05 (Dr. Rollene RotundaJames Hochrein, [facility]): CONCLUSION: 1.  Mild coronary plaque. 25% proximal LAD. 25% mid AV groove. 2.  Severe right iliac stenosis. 3.  Well preserved ejection fraction. EF 60%. PLAN:  No further cardiac workup is suggested.  Carotid U/S 12/22/14 (followed by Dr. Myra GianottiBrabham): Impression: Right ICA velocities suggest 40-59% stenosis. Left ICA artery velocities suggest less than 40% stenosis.  Preoperative labs noted. Cr 1.18. CBC WNL. Glucose 118.   If no acute changes then I would anticipate that she can proceed as planned.  [name], PA-C [facility]Anesthesiology Phone [phone] 04/13/2017 10:59 AM

## 2017-04-13 NOTE — Progress Notes (Addendum)
PCP: Dr. Clelia CroftShaw in [place] ,[place] [facility] Cardiologist: Dr. Wyline MoodBranch in [place], [place]--clearance in epic under [facility] test of cardiac clearance.  Muprioicin scrip [facility] to [facility].

## 2017-04-14 ENCOUNTER — Inpatient Hospital Stay (HOSPITAL_COMMUNITY): Payer: BLUE CROSS/BLUE SHIELD | Admitting: Vascular Surgery

## 2017-04-14 ENCOUNTER — Inpatient Hospital Stay (HOSPITAL_COMMUNITY): Payer: BLUE CROSS/BLUE SHIELD | Admitting: Certified Registered Nurse Anesthetist

## 2017-04-14 ENCOUNTER — Encounter (HOSPITAL_COMMUNITY): Payer: Self-pay | Admitting: *Deleted

## 2017-04-14 ENCOUNTER — Inpatient Hospital Stay (HOSPITAL_COMMUNITY): Payer: BLUE CROSS/BLUE SHIELD

## 2017-04-14 ENCOUNTER — Inpatient Hospital Stay (HOSPITAL_COMMUNITY)
Admission: RE | Admit: 2017-04-14 | Discharge: 2017-04-20 | DRG: 271 | Disposition: A | Discharge status: Home / Self Care | Payer: BLUE CROSS/BLUE SHIELD | Source: Ambulatory Visit | Attending: Surgery | Admitting: Surgery

## 2017-04-14 ENCOUNTER — Encounter (HOSPITAL_COMMUNITY)
Admission: RE | Disposition: A | Discharge status: Home / Self Care | Payer: Self-pay | Source: Ambulatory Visit | Attending: Surgery

## 2017-04-14 DIAGNOSIS — T82898A Other specified complication of vascular prosthetic devices, implants and grafts, initial encounter: Principal | ICD-10-CM | POA: Diagnosis present

## 2017-04-14 DIAGNOSIS — I70235 Atherosclerosis of native arteries of right leg with ulceration of other part of foot: Secondary | ICD-10-CM

## 2017-04-14 DIAGNOSIS — I10 Essential (primary) hypertension: Secondary | ICD-10-CM | POA: Diagnosis present

## 2017-04-14 DIAGNOSIS — Z7982 Long term (current) use of aspirin: Secondary | ICD-10-CM

## 2017-04-14 DIAGNOSIS — R110 Nausea: Secondary | ICD-10-CM | POA: Diagnosis not present

## 2017-04-14 DIAGNOSIS — Y829 Unspecified medical devices associated with adverse incidents: Secondary | ICD-10-CM | POA: Diagnosis present

## 2017-04-14 DIAGNOSIS — Z79890 Hormone replacement therapy (postmenopausal): Secondary | ICD-10-CM | POA: Diagnosis not present

## 2017-04-14 DIAGNOSIS — L600 Ingrowing nail: Secondary | ICD-10-CM | POA: Diagnosis present

## 2017-04-14 DIAGNOSIS — F17210 Nicotine dependence, cigarettes, uncomplicated: Secondary | ICD-10-CM | POA: Diagnosis present

## 2017-04-14 DIAGNOSIS — L97919 Non-pressure chronic ulcer of unspecified part of right lower leg with unspecified severity: Secondary | ICD-10-CM | POA: Diagnosis present

## 2017-04-14 DIAGNOSIS — E039 Hypothyroidism, unspecified: Secondary | ICD-10-CM | POA: Diagnosis present

## 2017-04-14 DIAGNOSIS — I739 Peripheral vascular disease, unspecified: Secondary | ICD-10-CM | POA: Diagnosis present

## 2017-04-14 DIAGNOSIS — T82856A Stenosis of peripheral vascular stent, initial encounter: Secondary | ICD-10-CM | POA: Diagnosis present

## 2017-04-14 DIAGNOSIS — Z79899 Other long term (current) drug therapy: Secondary | ICD-10-CM | POA: Diagnosis not present

## 2017-04-14 DIAGNOSIS — I6523 Occlusion and stenosis of bilateral carotid arteries: Secondary | ICD-10-CM | POA: Diagnosis not present

## 2017-04-14 DIAGNOSIS — I7409 Other arterial embolism and thrombosis of abdominal aorta: Secondary | ICD-10-CM | POA: Diagnosis present

## 2017-04-14 DIAGNOSIS — R109 Unspecified abdominal pain: Secondary | ICD-10-CM | POA: Diagnosis not present

## 2017-04-14 DIAGNOSIS — Z95828 Presence of other vascular implants and grafts: Secondary | ICD-10-CM

## 2017-04-14 DIAGNOSIS — F329 Major depressive disorder, single episode, unspecified: Secondary | ICD-10-CM | POA: Diagnosis present

## 2017-04-14 HISTORY — PX: AORTA - BILATERAL FEMORAL ARTERY BYPASS GRAFT: SHX1175

## 2017-04-14 LAB — BASIC METABOLIC PANEL
Anion gap: 6 (ref 5–15)
BUN: 13 mg/dL (ref 6–20)
CHLORIDE: 108 mmol/L (ref 101–111)
CO2: 21 mmol/L — ABNORMAL LOW (ref 22–32)
CREATININE: 0.97 mg/dL (ref 0.44–1.00)
Calcium: 8.4 mg/dL — ABNORMAL LOW (ref 8.9–10.3)
GFR calc Af Amer: >60 mL/min (ref >60)
GFR calc non Af Amer: >60 mL/min (ref >60)
GLUCOSE: 190 mg/dL — AB (ref 65–99)
Potassium: 4.2 mmol/L (ref 3.5–5.1)
Sodium: 135 mmol/L (ref 135–145)

## 2017-04-14 LAB — CBC
HCT: 30.6 % — ABNORMAL LOW (ref 36.0–46.0)
Hemoglobin: 10.1 g/dL — ABNORMAL LOW (ref 12.0–15.0)
MCH: 29.3 pg (ref 26.0–34.0)
MCHC: 33 g/dL (ref 30.0–36.0)
MCV: 88.7 fL (ref 78.0–100.0)
Platelets: 153 10*3/uL (ref 150–400)
RBC: 3.45 MIL/uL — ABNORMAL LOW (ref 3.87–5.11)
RDW: 13.1 % (ref 11.5–15.5)
WBC: 10.9 10*3/uL — ABNORMAL HIGH (ref 4.0–10.5)

## 2017-04-14 LAB — BLOOD GAS, ARTERIAL
Acid-base deficit: 5.5 mmol/L — ABNORMAL HIGH (ref 0.0–2.0)
BICARBONATE: 19.7 mmol/L — AB (ref 20.0–28.0)
O2 Saturation: 95 %
PCO2 ART: 40.8 mmHg (ref 32.0–48.0)
PH ART: 7.305 — AB (ref 7.350–7.450)
PO2 ART: 87.3 mmHg (ref 83.0–108.0)
Patient temperature: 98.6

## 2017-04-14 LAB — POCT I-STAT 3, ART BLOOD GAS (G3+)
Acid-base deficit: 6 mmol/L — ABNORMAL HIGH (ref 0.0–2.0)
Bicarbonate: 20.1 mmol/L (ref 20.0–28.0)
O2 Saturation: 88 %
TCO2: 21 mmol/L (ref 0–100)
pCO2 arterial: 39.4 mmHg (ref 32.0–48.0)
pH, Arterial: 7.316 — ABNORMAL LOW (ref 7.350–7.450)
pO2, Arterial: 59 mmHg — ABNORMAL LOW (ref 83.0–108.0)

## 2017-04-14 LAB — PROTIME-INR
INR: 1.12
Prothrombin Time: 14.5 seconds (ref 11.4–15.2)

## 2017-04-14 LAB — MAGNESIUM: MAGNESIUM: 1.5 mg/dL — AB (ref 1.7–2.4)

## 2017-04-14 LAB — PREPARE RBC (CROSSMATCH)

## 2017-04-14 LAB — APTT: aPTT: 32 seconds (ref 24–36)

## 2017-04-14 SURGERY — CREATION, BYPASS, ARTERIAL, AORTA TO FEMORAL, BILATERAL, USING GRAFT
Anesthesia: General | Site: Chest | Laterality: Bilateral

## 2017-04-14 MED ORDER — PHENOL 1.4 % MT LIQD
1.0000 | OROMUCOSAL | Status: DC | PRN
  Administered 2017-04-15: 1 via OROMUCOSAL
  Filled 2017-04-14: qty 177

## 2017-04-14 MED ORDER — OXYCODONE HCL 5 MG/5ML PO SOLN: 5.0000 mg | Freq: Once | ORAL | Status: DC | PRN

## 2017-04-14 MED ORDER — MIDAZOLAM HCL 2 MG/2ML IJ SOLN
INTRAMUSCULAR | Status: AC
  Filled 2017-04-14: qty 2

## 2017-04-14 MED ORDER — KCL IN DEXTROSE-NACL 20-5-0.45 MEQ/L-%-% IV SOLN
INTRAVENOUS | Status: DC
  Administered 2017-04-14 – 2017-04-15 (×2): via INTRAVENOUS
  Administered 2017-04-15: 125 mL via INTRAVENOUS
  Administered 2017-04-17: 50 mL/h via INTRAVENOUS
  Filled 2017-04-14 (×8): qty 1000

## 2017-04-14 MED ORDER — MIDAZOLAM HCL 5 MG/5ML IJ SOLN
INTRAMUSCULAR | Status: DC | PRN
  Administered 2017-04-14 (×2): 2 mg via INTRAVENOUS

## 2017-04-14 MED ORDER — METOPROLOL TARTRATE 5 MG/5ML IV SOLN
2.5000 mg | Freq: Four times a day (QID) | INTRAVENOUS | Status: DC
  Administered 2017-04-14 – 2017-04-19 (×17): 2.5 mg via INTRAVENOUS
  Filled 2017-04-14 (×16): qty 5

## 2017-04-14 MED ORDER — PHENYLEPHRINE HCL 10 MG/ML IJ SOLN
INTRAMUSCULAR | Status: DC | PRN
  Administered 2017-04-14: 120 ug via INTRAVENOUS
  Administered 2017-04-14: 160 ug via INTRAVENOUS

## 2017-04-14 MED ORDER — OXYCODONE HCL 5 MG PO TABS: 5.0000 mg | ORAL_TABLET | Freq: Once | ORAL | Status: DC | PRN

## 2017-04-14 MED ORDER — HYDROMORPHONE HCL 1 MG/ML IJ SOLN
INTRAMUSCULAR | Status: DC | PRN
  Administered 2017-04-14: 1 mg via INTRAVENOUS

## 2017-04-14 MED ORDER — KETOROLAC TROMETHAMINE 15 MG/ML IJ SOLN
15.0000 mg | Freq: Four times a day (QID) | INTRAMUSCULAR | Status: DC
  Administered 2017-04-14 – 2017-04-15 (×3): 15 mg via INTRAVENOUS
  Filled 2017-04-14 (×3): qty 1

## 2017-04-14 MED ORDER — HYDROMORPHONE HCL 1 MG/ML IJ SOLN
INTRAMUSCULAR | Status: AC
  Filled 2017-04-14: qty 0.5

## 2017-04-14 MED ORDER — HYDROMORPHONE HCL 1 MG/ML IJ SOLN
INTRAMUSCULAR | Status: AC
  Administered 2017-04-14: 1 mg
  Filled 2017-04-14: qty 0.5

## 2017-04-14 MED ORDER — HEPARIN SODIUM (PORCINE) 1000 UNIT/ML IJ SOLN
INTRAMUSCULAR | Status: AC
  Filled 2017-04-14: qty 1

## 2017-04-14 MED ORDER — PROPOFOL 10 MG/ML IV BOLUS
INTRAVENOUS | Status: AC
  Filled 2017-04-14: qty 20

## 2017-04-14 MED ORDER — PROTAMINE SULFATE 10 MG/ML IV SOLN
INTRAVENOUS | Status: AC
  Filled 2017-04-14: qty 5

## 2017-04-14 MED ORDER — ALBUMIN HUMAN 5 % IV SOLN
INTRAVENOUS | Status: DC | PRN
  Administered 2017-04-14 (×3): via INTRAVENOUS

## 2017-04-14 MED ORDER — LABETALOL HCL 5 MG/ML IV SOLN
10.0000 mg | INTRAVENOUS | Status: DC | PRN
  Administered 2017-04-14: 10 mg via INTRAVENOUS
  Filled 2017-04-14: qty 4

## 2017-04-14 MED ORDER — EPHEDRINE SULFATE 50 MG/ML IJ SOLN
INTRAMUSCULAR | Status: DC | PRN
  Administered 2017-04-14: 10 mg via INTRAVENOUS
  Administered 2017-04-14: 15 mg via INTRAVENOUS
  Administered 2017-04-14: 10 mg via INTRAVENOUS

## 2017-04-14 MED ORDER — SODIUM CHLORIDE 0.9 % IV SOLN
INTRAVENOUS | Status: DC | PRN
  Administered 2017-04-14: 09:00:00 500 mL

## 2017-04-14 MED ORDER — HEMOSTATIC AGENTS (NO CHARGE) OPTIME
TOPICAL | Status: DC | PRN
  Administered 2017-04-14 (×2): 1 via TOPICAL

## 2017-04-14 MED ORDER — CHLORHEXIDINE GLUCONATE 4 % EX LIQD: 60.0000 mL | Freq: Once | CUTANEOUS | Status: DC

## 2017-04-14 MED ORDER — FENTANYL CITRATE (PF) 100 MCG/2ML IJ SOLN
INTRAMUSCULAR | Status: DC | PRN
Start: 2017-04-14 — End: 2017-04-14
  Administered 2017-04-14 (×10): 50 ug via INTRAVENOUS

## 2017-04-14 MED ORDER — SUGAMMADEX SODIUM 200 MG/2ML IV SOLN
INTRAVENOUS | Status: DC | PRN
  Administered 2017-04-14: 150 mg via INTRAVENOUS

## 2017-04-14 MED ORDER — PROTAMINE SULFATE 10 MG/ML IV SOLN
INTRAVENOUS | Status: DC | PRN
  Administered 2017-04-14: 10 mg via INTRAVENOUS
  Administered 2017-04-14: 40 mg via INTRAVENOUS

## 2017-04-14 MED ORDER — SODIUM CHLORIDE 0.9 % IV SOLN: INTRAVENOUS | Status: DC

## 2017-04-14 MED ORDER — FENTANYL CITRATE (PF) 250 MCG/5ML IJ SOLN
INTRAMUSCULAR | Status: AC
  Filled 2017-04-14: qty 5

## 2017-04-14 MED ORDER — ALUM & MAG HYDROXIDE-SIMETH 200-200-20 MG/5ML PO SUSP: 15.0000 mL | ORAL | Status: DC | PRN

## 2017-04-14 MED ORDER — POTASSIUM CHLORIDE CRYS ER 20 MEQ PO TBCR: 20.0000 meq | EXTENDED_RELEASE_TABLET | Freq: Every day | ORAL | Status: DC | PRN

## 2017-04-14 MED ORDER — CEFUROXIME SODIUM 1.5 G IV SOLR
1.5000 g | Freq: Two times a day (BID) | INTRAVENOUS | Status: AC
  Administered 2017-04-14 – 2017-04-15 (×2): 1.5 g via INTRAVENOUS
  Filled 2017-04-14 (×2): qty 1.5

## 2017-04-14 MED ORDER — BISACODYL 10 MG RE SUPP: 10.0000 mg | Freq: Every day | RECTAL | Status: DC | PRN

## 2017-04-14 MED ORDER — LACTATED RINGERS IV SOLN
INTRAVENOUS | Status: DC | PRN
  Administered 2017-04-14: 07:00:00 via INTRAVENOUS

## 2017-04-14 MED ORDER — HYDROMORPHONE HCL 1 MG/ML IJ SOLN
0.5000 mg | INTRAMUSCULAR | Status: DC | PRN
  Administered 2017-04-14 – 2017-04-15 (×6): 1 mg via INTRAVENOUS
  Administered 2017-04-15 (×2): 0.5 mg via INTRAVENOUS
  Administered 2017-04-15 – 2017-04-16 (×2): 1 mg via INTRAVENOUS
  Administered 2017-04-16: 0.5 mg via INTRAVENOUS
  Administered 2017-04-18: 1 mg via INTRAVENOUS
  Filled 2017-04-14 (×12): qty 1

## 2017-04-14 MED ORDER — PREGABALIN 50 MG PO CAPS
150.0000 mg | ORAL_CAPSULE | Freq: Two times a day (BID) | ORAL | Status: DC
  Administered 2017-04-14 – 2017-04-20 (×9): 150 mg via ORAL
  Filled 2017-04-14 (×9): qty 3

## 2017-04-14 MED ORDER — HEPARIN SODIUM (PORCINE) 1000 UNIT/ML IJ SOLN
INTRAMUSCULAR | Status: DC | PRN
  Administered 2017-04-14: 1000 [IU] via INTRAVENOUS
  Administered 2017-04-14: 6000 [IU] via INTRAVENOUS

## 2017-04-14 MED ORDER — METOPROLOL TARTRATE 5 MG/5ML IV SOLN
INTRAVENOUS | Status: DC | PRN
  Administered 2017-04-14: 1 mg via INTRAVENOUS

## 2017-04-14 MED ORDER — GUAIFENESIN-DM 100-10 MG/5ML PO SYRP: 15.0000 mL | ORAL_SOLUTION | ORAL | Status: DC | PRN

## 2017-04-14 MED ORDER — ONDANSETRON HCL 4 MG/2ML IJ SOLN
4.0000 mg | Freq: Four times a day (QID) | INTRAMUSCULAR | Status: DC | PRN
  Administered 2017-04-14 – 2017-04-15 (×2): 4 mg via INTRAVENOUS
  Filled 2017-04-14 (×2): qty 2

## 2017-04-14 MED ORDER — ACETAMINOPHEN 325 MG RE SUPP: 325.0000 mg | RECTAL | Status: DC | PRN

## 2017-04-14 MED ORDER — ONDANSETRON HCL 4 MG/2ML IJ SOLN
INTRAMUSCULAR | Status: DC | PRN
  Administered 2017-04-14: 4 mg via INTRAVENOUS

## 2017-04-14 MED ORDER — 0.9 % SODIUM CHLORIDE (POUR BTL) OPTIME
TOPICAL | Status: DC | PRN
  Administered 2017-04-14: 3000 mL

## 2017-04-14 MED ORDER — HYDRALAZINE HCL 20 MG/ML IJ SOLN: 5.0000 mg | INTRAMUSCULAR | Status: DC | PRN

## 2017-04-14 MED ORDER — LACTATED RINGERS IV SOLN
INTRAVENOUS | Status: DC | PRN
  Administered 2017-04-14 (×2): via INTRAVENOUS

## 2017-04-14 MED ORDER — PHENYLEPHRINE HCL 10 MG/ML IJ SOLN
INTRAVENOUS | Status: DC | PRN
  Administered 2017-04-14: 50 ug/min via INTRAVENOUS

## 2017-04-14 MED ORDER — ACETAMINOPHEN 10 MG/ML IV SOLN
INTRAVENOUS | Status: AC
  Filled 2017-04-14: qty 100

## 2017-04-14 MED ORDER — PANTOPRAZOLE SODIUM 40 MG IV SOLR
40.0000 mg | Freq: Every day | INTRAVENOUS | Status: DC
  Administered 2017-04-14 – 2017-04-19 (×6): 40 mg via INTRAVENOUS
  Filled 2017-04-14 (×6): qty 40

## 2017-04-14 MED ORDER — ACETAMINOPHEN 325 MG PO TABS
325.0000 mg | ORAL_TABLET | ORAL | Status: DC | PRN
  Administered 2017-04-19: 650 mg via ORAL
  Filled 2017-04-14 (×2): qty 2

## 2017-04-14 MED ORDER — ACETAMINOPHEN 10 MG/ML IV SOLN
INTRAVENOUS | Status: DC | PRN
  Administered 2017-04-14: 1000 mg via INTRAVENOUS

## 2017-04-14 MED ORDER — ROCURONIUM BROMIDE 100 MG/10ML IV SOLN
INTRAVENOUS | Status: DC | PRN
  Administered 2017-04-14: 20 mg via INTRAVENOUS
  Administered 2017-04-14: 30 mg via INTRAVENOUS
  Administered 2017-04-14: 20 mg via INTRAVENOUS
  Administered 2017-04-14: 50 mg via INTRAVENOUS
  Administered 2017-04-14: 20 mg via INTRAVENOUS

## 2017-04-14 MED ORDER — LACTATED RINGERS IV SOLN
INTRAVENOUS | Status: DC | PRN
  Administered 2017-04-14 (×2): via INTRAVENOUS

## 2017-04-14 MED ORDER — MAGNESIUM SULFATE 2 GM/50ML IV SOLN
2.0000 g | Freq: Every day | INTRAVENOUS | Status: AC | PRN
Start: 2017-04-14 — End: 2017-04-17
  Administered 2017-04-17: 2 g via INTRAVENOUS
  Filled 2017-04-14: qty 50

## 2017-04-14 MED ORDER — DEXAMETHASONE SODIUM PHOSPHATE 10 MG/ML IJ SOLN
INTRAMUSCULAR | Status: DC | PRN
  Administered 2017-04-14: 5 mg via INTRAVENOUS

## 2017-04-14 MED ORDER — PROMETHAZINE HCL 25 MG/ML IJ SOLN: 6.2500 mg | INTRAMUSCULAR | Status: DC | PRN

## 2017-04-14 MED ORDER — PROPOFOL 10 MG/ML IV BOLUS
INTRAVENOUS | Status: DC | PRN
  Administered 2017-04-14: 110 mg via INTRAVENOUS

## 2017-04-14 MED ORDER — METOPROLOL TARTRATE 5 MG/5ML IV SOLN
2.0000 mg | INTRAVENOUS | Status: DC | PRN
  Filled 2017-04-14 (×2): qty 5

## 2017-04-14 MED ORDER — HYDROMORPHONE HCL 1 MG/ML IJ SOLN
0.2500 mg | INTRAMUSCULAR | Status: DC | PRN
  Administered 2017-04-14: 0.5 mg via INTRAVENOUS

## 2017-04-14 MED ORDER — PNEUMOCOCCAL VAC POLYVALENT 25 MCG/0.5ML IJ INJ
0.5000 mL | INJECTION | INTRAMUSCULAR | Status: AC
  Administered 2017-04-17: 0.5 mL via INTRAMUSCULAR
  Filled 2017-04-14: qty 0.5

## 2017-04-14 MED ORDER — DOCUSATE SODIUM 100 MG PO CAPS
100.0000 mg | ORAL_CAPSULE | Freq: Every day | ORAL | Status: DC
  Administered 2017-04-17 – 2017-04-20 (×4): 100 mg via ORAL
  Filled 2017-04-14 (×4): qty 1

## 2017-04-14 MED ORDER — SODIUM CHLORIDE 0.9 % IV SOLN: 500.0000 mL | Freq: Once | INTRAVENOUS | Status: DC | PRN

## 2017-04-14 MED FILL — Sodium Chloride IV Soln 0.9%: INTRAVENOUS | Qty: 1000 | Status: AC

## 2017-04-14 MED FILL — Heparin Sodium (Porcine) Inj 1000 Unit/ML: INTRAMUSCULAR | Qty: 30 | Status: AC

## 2017-04-14 SURGICAL SUPPLY — 64 items
ADH SKN CLS APL DERMABOND .7 (GAUZE/BANDAGES/DRESSINGS) ×3
CANISTER SUCT 3000ML PPV (MISCELLANEOUS) ×3 IMPLANT
CLIP TI MEDIUM 24 (CLIP) ×3 IMPLANT
CLIP TI MEDIUM 6 (CLIP) ×3 IMPLANT
CLIP TI WIDE RED SMALL 24 (CLIP) ×6 IMPLANT
COVER MAYO STAND STRL (DRAPES) ×3 IMPLANT
DERMABOND ADVANCED (GAUZE/BANDAGES/DRESSINGS) ×6
DERMABOND ADVANCED .7 DNX12 (GAUZE/BANDAGES/DRESSINGS) ×2 IMPLANT
ELECT BLADE 4.0 EZ CLEAN MEGAD (MISCELLANEOUS) ×3
ELECT BLADE 6.5 EXT (BLADE) ×2 IMPLANT
ELECT CAUTERY BLADE 6.4 (BLADE) ×3 IMPLANT
ELECT REM PT RETURN 9FT ADLT (ELECTROSURGICAL) ×6
ELECTRODE BLDE 4.0 EZ CLN MEGD (MISCELLANEOUS) ×1 IMPLANT
ELECTRODE REM PT RTRN 9FT ADLT (ELECTROSURGICAL) ×2 IMPLANT
FELT TEFLON 1X6 (MISCELLANEOUS) ×2 IMPLANT
GLOVE BIO SURGEON STRL SZ 6.5 (GLOVE) ×4 IMPLANT
GLOVE BIO SURGEONS STRL SZ 6.5 (GLOVE) ×4
GLOVE BIOGEL PI IND STRL 6 (GLOVE) IMPLANT
GLOVE BIOGEL PI IND STRL 6.5 (GLOVE) IMPLANT
GLOVE BIOGEL PI IND STRL 7.0 (GLOVE) IMPLANT
GLOVE BIOGEL PI IND STRL 7.5 (GLOVE) ×1 IMPLANT
GLOVE BIOGEL PI INDICATOR 6 (GLOVE) ×2
GLOVE BIOGEL PI INDICATOR 6.5 (GLOVE) ×10
GLOVE BIOGEL PI INDICATOR 7.0 (GLOVE) ×4
GLOVE BIOGEL PI INDICATOR 7.5 (GLOVE) ×2
GLOVE SURG SS PI 7.5 STRL IVOR (GLOVE) ×3 IMPLANT
GOWN STRL REUS W/ TWL LRG LVL3 (GOWN DISPOSABLE) ×2 IMPLANT
GOWN STRL REUS W/ TWL XL LVL3 (GOWN DISPOSABLE) ×1 IMPLANT
GOWN STRL REUS W/TWL LRG LVL3 (GOWN DISPOSABLE) ×12
GOWN STRL REUS W/TWL XL LVL3 (GOWN DISPOSABLE) ×6
GRAFT HEMASHIELD 14X7MM (Vascular Products) ×2 IMPLANT
HEMOSTAT SNOW SURGICEL 2X4 (HEMOSTASIS) ×4 IMPLANT
INSERT FOGARTY 61MM (MISCELLANEOUS) ×3 IMPLANT
INSERT FOGARTY SM (MISCELLANEOUS) ×6 IMPLANT
KIT BASIN OR (CUSTOM PROCEDURE TRAY) ×3 IMPLANT
KIT ROOM TURNOVER OR (KITS) ×3 IMPLANT
LOOP VESSEL MINI RED (MISCELLANEOUS) ×2 IMPLANT
NS IRRIG 1000ML POUR BTL (IV SOLUTION) ×8 IMPLANT
PACK AORTA (CUSTOM PROCEDURE TRAY) ×3 IMPLANT
PAD ARMBOARD 7.5X6 YLW CONV (MISCELLANEOUS) ×6 IMPLANT
PENCIL BUTTON HOLSTER BLD 10FT (ELECTRODE) ×2 IMPLANT
SUT ETHIBOND 5 LR DA (SUTURE) IMPLANT
SUT PDS AB 1 TP1 54 (SUTURE) ×6 IMPLANT
SUT PROLENE 3 0 SH 48 (SUTURE) ×12 IMPLANT
SUT PROLENE 5 0 C 1 24 (SUTURE) ×4 IMPLANT
SUT PROLENE 5 0 C 1 36 (SUTURE) ×6 IMPLANT
SUT PROLENE 6 0 C 1 24 (SUTURE) ×4 IMPLANT
SUT SILK 2 0 (SUTURE) ×9
SUT SILK 2 0 TIES 17X18 (SUTURE) ×3
SUT SILK 2 0SH CR/8 30 (SUTURE) ×3 IMPLANT
SUT SILK 2-0 18XBRD TIE 12 (SUTURE) ×1 IMPLANT
SUT SILK 2-0 18XBRD TIE BLK (SUTURE) ×1 IMPLANT
SUT SILK 3 0 (SUTURE) ×6
SUT SILK 3 0 TIES 17X18 (SUTURE) ×3
SUT SILK 3-0 18XBRD TIE 12 (SUTURE) ×1 IMPLANT
SUT SILK 3-0 18XBRD TIE BLK (SUTURE) ×1 IMPLANT
SUT VIC AB 2-0 CT1 27 (SUTURE) ×15
SUT VIC AB 2-0 CT1 TAPERPNT 27 (SUTURE) ×5 IMPLANT
SUT VIC AB 3-0 SH 27 (SUTURE) ×6
SUT VIC AB 3-0 SH 27X BRD (SUTURE) ×2 IMPLANT
SUT VICRYL 4-0 PS2 18IN ABS (SUTURE) ×12 IMPLANT
TOWEL BLUE STERILE X RAY DET (MISCELLANEOUS) ×6 IMPLANT
TRAY FOLEY W/METER SILVER 16FR (SET/KITS/TRAYS/PACK) ×3 IMPLANT
WATER STERILE IRR 1000ML POUR (IV SOLUTION) ×6 IMPLANT

## 2017-04-14 NOTE — Interval H&P Note (Signed)
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 After consideration of risks, benefits and other options for treatment, the patient has consented to  Procedure(s): AORTA BIFEMORAL BYPASS GRAFT (N/A) as a surgical intervention .  The patient's history has been reviewed, patient examined, no change in status, stable for surgery.  I have reviewed the patient's chart and labs.  Questions were answered to the patient's satisfaction.     Durene CalBrabham, [place]

## 2017-04-14 NOTE — Anesthesia Postprocedure Evaluation (Signed)
Anesthesia Post Note  Patient: Ashlee Snow  Procedure(s) Performed: Procedure(s) (LRB): AORTA BIFEMORAL BYPASS GRAFT WITH HEMASHIELD GOLD BIFURCATED 14MMX7 MM GRAFT (Bilateral)     Patient location during evaluation: PACU Anesthesia Type: General Level of consciousness: awake and alert Pain management: pain level controlled Vital Signs Assessment: post-procedure vital signs reviewed and stable Respiratory status: spontaneous breathing, nonlabored ventilation, respiratory function stable and patient connected to nasal cannula oxygen Cardiovascular status: blood pressure returned to baseline and stable Postop Assessment: no signs of nausea or vomiting Anesthetic complications: no    Last Vitals:  Vitals:   04/14/17 1320 04/14/17 1321  BP:    Pulse: 96 95  Resp: (!) 22 (!) 23  Temp: 36.9 C     Last Pain:  Vitals:   04/14/17 1230  TempSrc:   PainSc: 0-No pain                 Claritza July P Mekhi Lascola

## 2017-04-14 NOTE — Progress Notes (Signed)
  [facility] Day of Surgery Note  Subjective:  Uncomfortable. Complaining of pain.   Vitals:   04/14/17 1430 04/14/17 1445  BP: (!) 121/55   Pulse: 82 82  Resp: 14 16  Temp: 98.6 F (37 C)    Abdomen soft without distension.  Midline abdominal incision and bilateral groin incisions clean and intact without hematoma.  Palpable DP pulses bilaterally.    Assessment/Plan:  This is a 61 y.o. female who is s/p aortobifemoral bypass   Doing ok post-op.  Will start toradol given significant pain.   Maris BergerKimberly Trinh, [place] JerseyPA-C Pager: [phone] 04/14/2017 3:43 PM

## 2017-04-14 NOTE — Anesthesia Preprocedure Evaluation (Addendum)
Anesthesia Evaluation  Patient identified by MRN, date of birth, ID band Patient awake    Reviewed: Allergy & Precautions, NPO status , Patient's Chart, lab work & pertinent test results  Airway Mallampati: II  TM Distance: >3 FB Neck ROM: Full    Dental  (+) Edentulous Upper, Edentulous Lower   Pulmonary Current Smoker (vapor),    Pulmonary exam normal breath sounds clear to auscultation       Cardiovascular hypertension, Pt. on medications and Pt. on home beta blockers Normal cardiovascular exam Rhythm:Regular Rate:Normal  ECG: SR, rate 65  Cardiologist is Dr. Dina RichJonathan Branch with [facility] [place], seen 03/31/17 for evaluation of chest pain with admission ([facility]) 02/2017. She was cleared for surgery following low risk stress test.  Nuclear stress test 04/12/17: There was no ST segment deviation noted during stress. The study is normal. There are no perfusion defects This is a low risk study. The left ventricular ejection fraction is hyperdynamic (>65%).   Neuro/Psych Depression TIA   GI/Hepatic Neg liver ROS, GERD  Controlled,  Endo/Other  Hypothyroidism   Renal/GU negative Renal ROS  negative genitourinary   Musculoskeletal negative musculoskeletal ROS (+)   Abdominal   Peds negative pediatric ROS (+)  Hematology negative hematology ROS (+)   Anesthesia Other Findings Fibromyalgia  Reproductive/Obstetrics negative OB ROS                            Anesthesia Physical Anesthesia Plan  ASA: IV  Anesthesia Plan: General   Post-op Pain Management:    Induction: Intravenous  PONV Risk Score and Plan: 2 and Ondansetron, Dexamethasone and Propofol  Airway Management Planned: Oral ETT  Additional Equipment: Arterial line and CVP  Intra-op Plan:   Post-operative Plan: Extubation in OR  Informed Consent: I have reviewed the patients History and Physical, chart, labs  and discussed the procedure including the risks, benefits and alternatives for the proposed anesthesia with the patient or authorized representative who has indicated his/her understanding and acceptance.   Dental advisory given  Plan Discussed with: CRNA  Anesthesia Plan Comments:        Anesthesia Quick Evaluation

## 2017-04-14 NOTE — Transfer of Care (Signed)
Immediate Anesthesia Transfer of Care Note  Patient: Ashlee Snow  Procedure(s) Performed: Procedure(s): AORTA BIFEMORAL BYPASS GRAFT WITH HEMASHIELD GOLD BIFURCATED 14MMX7 MM GRAFT (Bilateral)  Patient Location: PACU  Anesthesia Type:General  Level of Consciousness: awake, alert  and patient cooperative  Airway & Oxygen Therapy: Patient Spontanous Breathing and Patient connected to nasal cannula oxygen  Post-op Assessment: Report given to RN and Post -op Vital signs reviewed and stable  Post vital signs: Reviewed and stable  Last Vitals:  Vitals:   04/14/17 0618 04/14/17 1230  BP: 129/70   Pulse: 77   Resp: 20   Temp: 36.7 C 36.7 C    Last Pain:  Vitals:   04/14/17 0618  TempSrc: Oral  PainSc:       Patients Stated Pain Goal: 2 (04/14/17 [id number])  Complications: No apparent anesthesia complications

## 2017-04-14 NOTE — Op Note (Signed)
Patient name: Ashlee Snow MRN: [id number] DOB: 12/21/1955 Sex: female  04/14/2017 Pre-operative Diagnosis: right leg ulcer Post-operative diagnosis:  Same Surgeon:  Durene CalBrabham, Wells Assistants:  Karsten RoKim Trinh Procedure:   aorto- bifemoral bypass with 14 x 7 dacryon graft Anesthesia:  Gen. Blood Loss:  See anesthesia record Specimens:  None  Findings:  End to end the proximal anastomosis.  The distal anastomosis was to the bilateral distal common femoral arteries.  I did not reimplant the IMA as there was excellent Doppler signal after the case.  The patient had a retroaortic left renal vein  Indications:  The patient has a history of right iliac stenting.  These have occluded.  She has significant claudication symptoms and has now developed a wound on her right toe.  We discussed multiple options for revascularization but feel given her age and the aortobifemoral is the most appropriate  Procedure:  The patient was identified in the holding area and [facility]  The patient was then placed supine on the table. general anesthesia was administered.  The patient was prepped and draped in the usual sterile fashion.  A time out was called and antibiotics were administered.  Longitudinal incisions were made in both groins after identifying the femoral artery with ultrasound.  Using a combination of sharp and cautery dissection, I dissected free the common femoral, profunda femoral, superficial femoral arteries bilaterally.  Crossing iliac circumflex veins were divided under the inguinal ligament.  Moist gauze was placed in the groins.  Attention was turned towards the abdomen.  A midline incision was made from the xiphoid to below the umbilicus.  Cautery was used to divide subcutaneous tissue down to the fascia which was opened in the midline.  The peritoneal cavity was then entered sharply and then the peritoneum and fascia were opened throughout the length of the incision.  THe abdominal  contents were inspected.  There was no gross pathology.  A Balfour was placed followed by a Omni tract retractor.  The transverse colon was reflected cephalad, and the small bowel was mobilized to the patient's right.  The ligament of Treitz was taken down sharply.  I divided the inferior mesenteric vein between silk ties.  I then dissected down to the retroperitoneal tissue until the aorta was reached.  The aorta was dissected free from the renal arteries down to the bifurcation.  The patient had a retroaortic left renal vein which was protected.  Once I had adequate exposure and circumferential control of the proximal aorta, I created a tunnel between the abdominal and groin incisions.  I made sure to stay directly anterior to the artery, posterior to the ureter.  Umbilical tapes were passed.  The patient was fully heparinized.  After the heparin circulated the aorta was occluded at the level of the renal arteries with a heart clamp.  The distal aorta was occluded with a peripheral DeBakey clamp.  Using a #11 blade and Mayo scissors, the aorta was transected.  I used a 3-0 Prolene to oversew the distal aorta and 2 layers.  Once this was done the peripheral DeBakey clamp was removed and this was hemostatic.  I then selected a 14 x 7 bifurcated dacryon graft.  The proximal anastomosis was performed in a and 2 and fashion.  I used a felt strip proximal anastomosis.  Once this was completed the clamp was released.  The anastomosis was hemostatic.  Each limb of the graft was brought into the respective groin.  I performed the anastomosis of the right common femoral artery first.  The right common femoral, profunda femoral, superficial femoral artery were individually occluded.  A #11 blade was used to make an arteriotomy which was extended longitudinally with Potts scissors down to the origin of the profunda femoral artery.  The limb of the graft was cut the appropriate length and spatulated.  A running anastomosis was  created with 5-0 Prolene.  Prior to completion, the appropriate flushing maneuvers were performed and the anastomosis was completed and blood flow was reestablished to the right leg.  Next attention was turned towards the left groin.  The left common femoral, profunda femoral, and superficial femoral artery were occluded.  A #11 blade was used to make an arteriotomy which was extended longitudinally with Potts scissors down to the origin of the profunda.  The graft was cut to the appropriate length and then spatulated.  A running anastomosis was created with 5-0 Prolene.  Prior to completion, the appropriate flushing maneuvers were performed the anastomosis was completed.  Blood flow was reestablished to the left leg.  At this point, hand-held Doppler was used to evaluate the signals in the profunda and superficial femoral artery bilaterally.  These were brisk triphasic signals.  I then listened to the inferior mesenteric artery which had a breast triphasic signal.  50 mg protamine was then given.  The abdomen was then inspected.  Hemostasis was satisfactory.  I reapproximated the retroperitoneum with 2-0 Vicryl.  The small bowel was then reinspected and without pathology.  The bowel was placed back into its anatomic position.  I confirmed the NG tube was in good position.  The fascia was then closed with 2 #1 PDS suture.  The subcutaneous tissue was closed with 2-0 Vicryl the skin was closed with 4-0 Vicryl followed by Dermabond.  Both groins were then irrigated.  Hemostasis was achieved.  The femoral sheaths were closed bilaterally with 2-0 Vicryl.  The subcutaneous tissue was closed with multiple layers of 3-0 Vicryl, followed by 4-0 Vicryl on the skin and Dermabond.  The patient had excellent Doppler signals in her feet which were warm and pink.  She was then successfully extubated.  She was hemodynamically stable.  All lap and sponge counts and needle counts were correct   Disposition:  To PACU  stable   V. Durene Cal, M.D. [facility] [place] Office: [phone] Pager:  [phone]

## 2017-04-14 NOTE — Anesthesia Procedure Notes (Signed)
Central Venous Catheter Insertion Performed by: Leonides GrillsELLENDER, Rodnisha Blomgren P, anesthesiologist Start/End6/22/2018 7:05 AM, 04/14/2017 7:15 AM Patient location: Pre-op. Preanesthetic checklist: patient identified, IV checked, site marked, risks and benefits discussed, surgical consent, monitors and equipment checked, pre-op evaluation, timeout performed and anesthesia consent Position: Trendelenburg Lidocaine 1% used for infiltration and patient sedated Hand hygiene performed  and maximum sterile barriers used  Catheter size: 8 Fr Total catheter length 16. Central line was placed.Double lumen Procedure performed using ultrasound guided technique. Ultrasound Notes:anatomy identified, needle tip was noted to be adjacent to the nerve/plexus identified, no ultrasound evidence of intravascular and/or intraneural injection and image(s) printed for medical record Attempts: 1 Following insertion, dressing applied and line sutured. Post procedure assessment: blood return through all ports  Patient tolerated the procedure well with no immediate complications.

## 2017-04-14 NOTE — Anesthesia Procedure Notes (Signed)
Procedure Name: Intubation Date/Time: 04/14/2017 7:46 AM Performed by: Salli Quarry Nixon Sparr Pre-anesthesia Checklist: Patient identified, Emergency Drugs available, Suction available and Patient being monitored Patient Re-evaluated:Patient Re-evaluated prior to inductionOxygen Delivery Method: Circle System Utilized Preoxygenation: Pre-oxygenation with 100% oxygen Intubation Type: IV induction Ventilation: Mask ventilation without difficulty and Oral airway inserted - appropriate to patient size Laryngoscope Size: Mac and 3 Grade View: Grade I Tube type: Oral Tube size: 7.0 mm Number of attempts: 1 Airway Equipment and Method: Stylet and Oral airway Placement Confirmation: ETT inserted through vocal cords under direct vision,  positive ETCO2 and breath sounds checked- equal and bilateral Secured at: 22 cm Tube secured with: Tape Dental Injury: Teeth and Oropharynx as per pre-operative assessment

## 2017-04-14 NOTE — H&P (View-Only) (Signed)
[facility] [place]  Patient name: Ashlee Snow MRN: [id number] DOB: 1956/04/08 Sex: female   REASON FOR VISIT:    Follow up  HISOTRY OF PRESENT ILLNESS:   Ashlee Snow is a 61 y.o. female who is here today for follow-up.  She initially underwent a right common iliac stent in 2007 by Dr. Madilyn Fireman for right leg claudication.  She had a recurrence of her symptoms in 2011 and underwent stenting of her right common and external iliac artery.  In 2012, she was found to have elevated velocities and decreased ankle-brachial indices and therefore she underwent angiography which showed a recurrent stenosis.  This was unresponsive to balloon angioplasty and therefore was treated with a covered stent.  She underwent surgery for her lower back as she was also having symptoms consistent with neuropathic back pain.  Since Christmas she has been limited to minimal activity.  She states she gets a pain in her right groin that radiates around to her back.  She also has early right leg giving out after taking just a few steps.  She recently fell and is complaining of right foot pain.  She also was picking at a ingrown toenail and somehow has created a wound from the bandage on the bottom of her right great toe.  She is having difficulty putting weight on her foot.  She was recently evaluated for chest pain.  She has a Myoview pending she takes a statin for hypercholesterolemia and is on ACE inhibitor for hypertension.  She is a current smoker.  There is a significant family history of cardiovascular disease.   PAST MEDICAL HISTORY:   Past Medical History:  Diagnosis Date  . Asthma   . Bronchitis   . DDD (degenerative disc disease)   . Fibromyalgia   . History of depression   . Hypertension   . Leg pain   . Migraine   . Peripheral arterial disease (HCC)   . Peripheral vascular disease (HCC)   . Sciatica   . Thyroid disease   . TMJ  (temporomandibular joint disorder) 08/24/1991   steal plate in right jaw  . Ulcer      FAMILY HISTORY:   Family History  Problem Relation Age of Onset  . Heart disease Mother   . Cancer Mother        lung  . Arthritis Mother   . Diabetes Mother   . Stroke Mother   . Hypertension Mother   . Heart disease Father   . Arthritis Father   . Heart failure Father   . Hypertension Father   . Stroke Father   . Peripheral vascular disease Father   . Heart disease Sister   . Arthritis Sister   . Kidney disease Sister   . Stroke Sister   . Fibromyalgia Sister   . Colon cancer Neg Hx     SOCIAL HISTORY:   Social History  Substance Use Topics  . Smoking status: Current Every Day Smoker    Packs/day: 1.00    Types: Cigarettes, E-cigarettes    Start date: 08/03/1975  . Smokeless tobacco: Never Used     Comment: Vaper cigarettes "juice", 20mg .  . Alcohol use No     ALLERGIES:   Allergies  Allergen Reactions  . Ativan [Lorazepam]      CURRENT MEDICATIONS:   Current Outpatient Prescriptions  Medication Sig Dispense Refill  . BELSOMRA 5 MG TABS TAKE 1 OR 2 TABLETS BY MOUTH AT BEDTIME FOR  persistent insomnia  1  . BLACK COHOSH PO Take 2 capsules by mouth 3 (three) times daily.     . ergocalciferol (VITAMIN D2) 50000 units capsule Take 50,000 Units by mouth once a week.    Marland Kitchen. HM ASPIRIN 81 MG chewable tablet Chew 162 mg by mouth daily.  1  . levothyroxine (SYNTHROID, LEVOTHROID) 75 MCG tablet Take 75 mcg by mouth daily before breakfast.    . lisinopril (PRINIVIL,ZESTRIL) 20 MG tablet Take 20 mg by mouth every morning.     Marland Kitchen. LYRICA 150 MG capsule Take 150 mg by mouth 2 (two) times daily.  1  . methocarbamol (ROBAXIN) 750 MG tablet Take 750 mg by mouth 4 (four) times daily.     . metoprolol succinate (TOPROL-XL) 25 MG 24 hr tablet Take 25 mg by mouth every morning.    Marland Kitchen. oxyCODONE-acetaminophen (PERCOCET) 10-325 MG per tablet Take 1 tablet by mouth 3 (three) times daily as  needed.     . pravastatin (PRAVACHOL) 20 MG tablet Take 20 mg by mouth daily.    . promethazine (PHENERGAN) 25 MG tablet Take 1 tablet by mouth every 6 (six) hours as needed.  2  . venlafaxine XR (EFFEXOR-XR) 75 MG 24 hr capsule Take 75 mg by mouth every morning.  1   No current facility-administered medications for this visit.     REVIEW OF SYSTEMS:   [X]  denotes positive finding, [ ]  denotes negative finding Cardiac  Comments:  Chest pain or chest pressure: x   Shortness of breath upon exertion:    Short of breath when lying flat:    Irregular heart rhythm:        Vascular    Pain in calf, thigh, or hip brought on by ambulation: x   Pain in feet at night that wakes you up from your sleep:  x   Blood clot in your veins:    Leg swelling:  xx       Pulmonary    Oxygen at home:    Productive cough:     Wheezing:         Neurologic    Sudden weakness in arms or legs:     Sudden numbness in arms or legs:     Sudden onset of difficulty speaking or slurred speech:    Temporary loss of vision in one eye:     Problems with dizziness:         Gastrointestinal    Blood in stool:     Vomited blood:         Genitourinary    Burning when urinating:     Blood in urine:        Psychiatric    Major depression:         Hematologic    Bleeding problems:    Problems with blood clotting too easily:        Skin    Rashes or ulcers:        Constitutional    Fever or chills:      PHYSICAL EXAM:   Vitals:   04/10/17 1336  BP: 136/82  Pulse: 98  Resp: 18  Temp: 97.6 F (36.4 C)  TempSrc: Oral  SpO2: 98%  Weight: 144 lb (65.3 kg)  Height: 5\' 4"  (1.626 m)    GENERAL: The patient is a well-nourished female, in no acute distress. The vital signs are documented above. CARDIAC: There is a regular rate and rhythm.  VASCULAR: Nonpalpable pedal pulse  on the right PULMONARY: Non-labored respirations ABDOMEN: Soft and non-tender with normal pitched bowel sounds.    MUSCULOSKELETAL: There are no major deformities or cyanosis. NEUROLOGIC: No focal weakness or paresthesias are detected. SKIN: Open wound on the plantar surface of the right great toe. PSYCHIATRIC: The patient has a normal affect.  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 The right common femoral artery reconstitutes with 3 vessel runoff.  Bilateral internal iliac artery occlusion. Branch vessels reconstitute bilaterally  Left common and external iliac arteries patent.  Left femoral and popliteal arteries are patent. Three vessel runoff to the left ankle.  Significant narrowing of the left renal artery. Correlate with a history of uncontrolled hypertension.  NON-VASCULAR  No acute intra-abdominal process.  MEDICAL ISSUES:   Right great toe wound: I discussed with the patient and her husband that this is a limb threatening situation.  She has a right iliac occlusion.  I presented to surgical options.  We discussed an aortobifemoral bypass graft and a left-to-right femoral-femoral bypass graft.  We have decided to proceed with an aortobifemoral bypass graft.  The risks and benefits of the operation were discussed with the patient including the risk of death, renal dysfunction, intestinal ischemia, lower extremity ischemia, wound complications and cardiac issues.  The patient is undergoing a cardiac workup currently.  I will try to expedite this so that we can proceed with surgery on Friday.  I am starting her on antibiotics today for some slight skin and color changes around her ulcer.    Durene Cal, MD [facility] [phone] Pager [phone]

## 2017-04-15 ENCOUNTER — Inpatient Hospital Stay (HOSPITAL_COMMUNITY): Payer: BLUE CROSS/BLUE SHIELD

## 2017-04-15 LAB — CBC
HEMATOCRIT: 28.3 % — AB (ref 36.0–46.0)
Hemoglobin: 9.3 g/dL — ABNORMAL LOW (ref 12.0–15.0)
MCH: 29.2 pg (ref 26.0–34.0)
MCHC: 32.9 g/dL (ref 30.0–36.0)
MCV: 88.7 fL (ref 78.0–100.0)
PLATELETS: 146 10*3/uL — AB (ref 150–400)
RBC: 3.19 MIL/uL — AB (ref 3.87–5.11)
RDW: 13.2 % (ref 11.5–15.5)
WBC: 11.9 10*3/uL — AB (ref 4.0–10.5)

## 2017-04-15 LAB — COMPREHENSIVE METABOLIC PANEL
ALT: 24 U/L (ref 14–54)
AST: 37 U/L (ref 15–41)
Albumin: 3.5 g/dL (ref 3.5–5.0)
Alkaline Phosphatase: 52 U/L (ref 38–126)
Anion gap: 5 (ref 5–15)
BUN: 15 mg/dL (ref 6–20)
CHLORIDE: 107 mmol/L (ref 101–111)
CO2: 22 mmol/L (ref 22–32)
CREATININE: 1.05 mg/dL — AB (ref 0.44–1.00)
Calcium: 8.4 mg/dL — ABNORMAL LOW (ref 8.9–10.3)
GFR calc Af Amer: >60 mL/min (ref >60)
GFR, EST NON AFRICAN AMERICAN: 57 mL/min — AB (ref >60)
GLUCOSE: 206 mg/dL — AB (ref 65–99)
Potassium: 4.9 mmol/L (ref 3.5–5.1)
Sodium: 134 mmol/L — ABNORMAL LOW (ref 135–145)
Total Bilirubin: 0.4 mg/dL (ref 0.3–1.2)
Total Protein: 5.3 g/dL — ABNORMAL LOW (ref 6.5–8.1)

## 2017-04-15 LAB — AMYLASE: Amylase: 52 U/L (ref 28–100)

## 2017-04-15 LAB — MAGNESIUM: MAGNESIUM: 1.5 mg/dL — AB (ref 1.7–2.4)

## 2017-04-15 MED ORDER — MUPIROCIN 2 % EX OINT
1.0000 "application " | TOPICAL_OINTMENT | Freq: Two times a day (BID) | CUTANEOUS | Status: AC
  Administered 2017-04-15 – 2017-04-19 (×10): 1 via NASAL
  Filled 2017-04-15 (×2): qty 22

## 2017-04-15 MED ORDER — SODIUM CHLORIDE 0.9% FLUSH: 9.0000 mL | INTRAVENOUS | Status: DC | PRN

## 2017-04-15 MED ORDER — NICOTINE 14 MG/24HR TD PT24
14.0000 mg | MEDICATED_PATCH | Freq: Every day | TRANSDERMAL | Status: DC
  Administered 2017-04-15 – 2017-04-20 (×6): 14 mg via TRANSDERMAL
  Filled 2017-04-15 (×6): qty 1

## 2017-04-15 MED ORDER — HYDROMORPHONE 1 MG/ML IV SOLN
INTRAVENOUS | Status: DC
  Administered 2017-04-15: 2.7 mg via INTRAVENOUS
  Administered 2017-04-15: 2.4 mg via INTRAVENOUS
  Administered 2017-04-15: 3.3 mg via INTRAVENOUS
  Administered 2017-04-15 – 2017-04-16 (×2): 25 mg via INTRAVENOUS
  Administered 2017-04-16: 3 mg via INTRAVENOUS
  Administered 2017-04-16: 0.6 mg via INTRAVENOUS
  Administered 2017-04-16 (×2): 3.3 mg via INTRAVENOUS
  Administered 2017-04-16: 1.8 mg via INTRAVENOUS
  Administered 2017-04-17: 1.5 mg via INTRAVENOUS
  Administered 2017-04-17: 0.9 mg via INTRAVENOUS
  Administered 2017-04-17: 0.6 mg via INTRAVENOUS
  Administered 2017-04-17: 1.8 mg via INTRAVENOUS
  Administered 2017-04-17: 3.9 mg via INTRAVENOUS
  Filled 2017-04-15 (×2): qty 25

## 2017-04-15 MED ORDER — DIPHENHYDRAMINE HCL 50 MG/ML IJ SOLN
12.5000 mg | Freq: Four times a day (QID) | INTRAMUSCULAR | Status: DC | PRN
  Administered 2017-04-16 – 2017-04-17 (×2): 12.5 mg via INTRAVENOUS
  Filled 2017-04-15 (×2): qty 1

## 2017-04-15 MED ORDER — ONDANSETRON HCL 4 MG/2ML IJ SOLN
4.0000 mg | Freq: Four times a day (QID) | INTRAMUSCULAR | Status: DC | PRN
  Administered 2017-04-17 (×2): 4 mg via INTRAVENOUS
  Filled 2017-04-15 (×2): qty 2

## 2017-04-15 MED ORDER — NALOXONE HCL 0.4 MG/ML IJ SOLN: 0.4000 mg | INTRAMUSCULAR | Status: DC | PRN

## 2017-04-15 MED ORDER — CHLORHEXIDINE GLUCONATE CLOTH 2 % EX PADS
6.0000 | MEDICATED_PAD | Freq: Every day | CUTANEOUS | Status: AC
  Administered 2017-04-15 – 2017-04-18 (×4): 6 via TOPICAL

## 2017-04-15 MED ORDER — DIPHENHYDRAMINE HCL 12.5 MG/5ML PO ELIX: 12.5000 mg | ORAL_SOLUTION | Freq: Four times a day (QID) | ORAL | Status: DC | PRN

## 2017-04-15 NOTE — Progress Notes (Signed)
Orthopedic Tech Progress Note Patient Details:  Ashlee Snow [birth year] [id number]  Ortho Devices Type of Ortho Device: Postop shoe/boot Ortho Device/Splint Location: bilateral Ortho Device/Splint Interventions: Application   Samie Reasons 04/15/2017, 11:29 AM

## 2017-04-15 NOTE — Evaluation (Signed)
Physical Therapy Evaluation Patient Details Name: Ashlee Snow MRN: [id number] DOB: 09/27/1956 Today's Date: 04/15/2017   History of Present Illness  Pt is a 61 y/o female s/p aorto-bifemoral bypass on 6/22. PMH including but not limited to fibromyalgia, HTN, PAD and PVD.  Clinical Impression  Pt presented sitting OOB in recliner, awake and willing to participate in therapy session. Pt's spouse was present throughout evaluation. Prior to admission, pt reported that she was mod I with functional mobility with use of RW to ambulate and independent with ADLs. Pt tolerated ambulating in hallway with min guard to min A and use of RW. Pt ambulated on RA with SPO2 decreasing to as low as 79%, requiring standing rest breaks with instruction in pursed lip breathing to recover to >90%. All other VSS throughout. Pt would continue to benefit from skilled physical therapy services at this time while admitted and after d/c to address the below listed limitations in order to improve overall safety and independence with functional mobility.      Follow Up Recommendations Home health PT;Supervision/Assistance - 24 hour    Equipment Recommendations  None recommended by PT    Recommendations for Other Services       Precautions / Restrictions Precautions Precautions: Fall Required Braces or Orthoses: Other Brace/Splint Other Brace/Splint: R post-op shoe Restrictions Weight Bearing Restrictions: No      Mobility  Bed Mobility               General bed mobility comments: pt sitting OOB in recliner chair upon arrival  Transfers Overall transfer level: Needs assistance Equipment used: Rolling walker (2 wheeled) Transfers: Sit to/from Stand Sit to Stand: Min assist         General transfer comment: increased time, vc'ing for technique, min A for stability with rise from chair  Ambulation/Gait Ambulation/Gait assistance: Min guard;Min assist Ambulation Distance (Feet): 400  Feet Assistive device: Rolling walker (2 wheeled) Gait Pattern/deviations: Step-through pattern;Decreased stride length;Trunk flexed;Drifts right/left Gait velocity: decreased Gait velocity interpretation: Below normal speed for age/gender General Gait Details: modest instability with use of RW but no overt LOB, constant min guard and occasional min A for directional guidance and safety with RW  Stairs            Wheelchair Mobility    Modified Rankin (Stroke Patients Only)       Balance Overall balance assessment: Needs assistance Sitting-balance support: Feet supported Sitting balance-Leahy Scale: Fair     Standing balance support: During functional activity;No upper extremity supported Standing balance-Leahy Scale: Fair                               Pertinent Vitals/Pain Pain Assessment: Faces Faces Pain Scale: Hurts even more Pain Location: abdomen Pain Descriptors / Indicators: Burning Pain Intervention(s): Monitored during session;Repositioned;PCA encouraged    Home Living Family/patient expects to be discharged to:: Private residence Living Arrangements: Spouse/significant other Available Help at Discharge: Family;Available 24 hours/day Type of Home: House Home Access: Stairs to enter   Entergy CorporationEntrance Stairs-Number of Steps: 1 Home Layout: One level Home Equipment: Walker - 2 wheels;Cane - single point      Prior Function Level of Independence: Independent with assistive device(s)         Comments: pt reported that she was ambulating with use of RW all the time secondary to pain     Hand Dominance        Extremity/Trunk Assessment  Upper Extremity Assessment Upper Extremity Assessment: Overall WFL for tasks assessed    Lower Extremity Assessment Lower Extremity Assessment: Generalized weakness    Cervical / Trunk Assessment Cervical / Trunk Assessment: Kyphotic  Communication   Communication: No difficulties  Cognition  Arousal/Alertness: Awake/alert Behavior During Therapy: WFL for tasks assessed/performed Overall Cognitive Status: Impaired/Different from baseline Area of Impairment: Attention;Following commands;Memory;Safety/judgement;Problem solving                   Current Attention Level: Sustained Memory: Decreased short-term memory Following Commands: Follows one step commands with increased time Safety/Judgement: Decreased awareness of deficits;Decreased awareness of safety   Problem Solving: Difficulty sequencing;Requires verbal cues;Requires tactile cues General Comments: pt's spouse present throughout and did not indicate that this was off from her baseline      General Comments      Exercises     Assessment/Plan    PT Assessment Patient needs continued PT services  PT Problem List Decreased strength;Decreased balance;Decreased mobility;Decreased coordination;Decreased cognition;Decreased knowledge of use of DME;Decreased safety awareness;Cardiopulmonary status limiting activity;Pain       PT Treatment Interventions DME instruction;Gait training;Stair training;Functional mobility training;Therapeutic activities;Therapeutic exercise;Balance training;Neuromuscular re-education;Cognitive remediation;Patient/family education    PT Goals (Current goals can be found in the Care Plan section)  Acute Rehab PT Goals Patient Stated Goal: return to walking PT Goal Formulation: With patient/family Time For Goal Achievement: 04/29/17 Potential to Achieve Goals: Good    Frequency Min 3X/week   Barriers to discharge        Co-evaluation               AM-PAC PT "6 Clicks" Daily Activity  Outcome Measure Difficulty turning over in bed (including adjusting bedclothes, sheets and blankets)?: A Lot Difficulty moving from lying on back to sitting on the side of the bed? : A Lot Difficulty sitting down on and standing up from a chair with arms (e.g., wheelchair, bedside commode,  etc,.)?: Total Help needed moving to and from a bed to chair (including a wheelchair)?: A Little Help needed walking in hospital room?: A Little Help needed climbing 3-5 steps with a railing? : A Little 6 Click Score: 14    End of Session Equipment Utilized During Treatment: Gait belt Activity Tolerance: Patient tolerated treatment well Patient left: in chair;with call bell/phone within reach;with family/visitor present Nurse Communication: Mobility status PT Visit Diagnosis: Unsteadiness on feet (R26.81);Other abnormalities of gait and mobility (R26.89);Pain Pain - part of body:  (abdomen)    Time: 1320-1406 PT Time Calculation (min) (ACUTE ONLY): 46 min   Charges:   PT Evaluation $PT Eval Moderate Complexity: 1 Procedure PT Treatments $Gait Training: 23-37 mins   PT G Codes:        [place], PT, DPT [phone]   Alessandra Bevels Melvyn Hommes 04/15/2017, 2:54 PM

## 2017-04-15 NOTE — Progress Notes (Addendum)
  [facility] Progress Note  Subjective  - POD #1  Complaining of abdominal pain and right foot pain. Passing a little flatus.   Objective Vitals:   04/15/17 0800 04/15/17 0829  BP: 127/81   Pulse: 67   Resp: 17   Temp:  98.7 F (37.1 C)    Intake/Output Summary (Last 24 hours) at 04/15/17 0845 Last data filed at 04/15/17 0807  Gross per 24 hour  Intake          6107.92 ml  Output             1985 ml  Net          4122.92 ml   No bowel sounds. Abdomen soft with mild tenderness to palpation.  Incisions clean and intact.  Palpable DP pulses bilaterally.   Assessment/Planning: 61 y.o. female is s/p: aortobifemoral bypass 1 Day Post-Op   Continue NG tube. Had nausea over night. Start ice chips.  D/c central line. Use peripheral IV.  Dilaudid PCA for pain.  Mobilize out of bed today.  Keep in 2H today.   Ashlee Snow 04/15/2017 8:45 AM --  Laboratory CBC    Component Value Date/Time   WBC 11.9 (H) 04/15/2017 0325   HGB 9.3 (L) 04/15/2017 0325   HCT 28.3 (L) 04/15/2017 0325   PLT 146 (L) 04/15/2017 0325    BMET    Component Value Date/Time   NA 134 (L) 04/15/2017 0325   K 4.9 04/15/2017 0325   CL 107 04/15/2017 0325   CO2 22 04/15/2017 0325   GLUCOSE 206 (H) 04/15/2017 0325   BUN 15 04/15/2017 0325   CREATININE 1.05 (H) 04/15/2017 0325   CALCIUM 8.4 (L) 04/15/2017 0325   GFRNONAA 57 (L) 04/15/2017 0325   GFRAA >60 04/15/2017 0325    COAG Lab Results  Component Value Date   INR 1.12 04/14/2017   INR 1.02 04/13/2017   No results found for: PTT  Antibiotics Anti-infectives    Start     Dose/Rate Route Frequency Ordered Stop   04/14/17 2000  cefUROXime (ZINACEF) 1.5 g in dextrose 5 % 50 mL IVPB     1.5 g 100 mL/hr over 30 Minutes Intravenous Every 12 hours 04/14/17 1317 04/15/17 0837   04/14/17 0715  cefUROXime (ZINACEF) 1.5 g in dextrose 5 % 50 mL IVPB     1.5 g 100 mL/hr over 30 Minutes Intravenous 30 min pre-op 04/13/17  1542 04/14/17 1425       Ashlee BergerKimberly Trinh, PA-C [facility] Office: [phone] Pager: [phone] 04/15/2017 8:45 AM   I have independently interviewed patient and agree with PA assessment and plan above. Needs better pain control and oob as tolerates.   Ashlee Peitz C. Randie Heinzain, MD [facility] [place] Office: [phone] Pager: [phone]

## 2017-04-16 ENCOUNTER — Encounter (HOSPITAL_COMMUNITY): Payer: BLUE CROSS/BLUE SHIELD

## 2017-04-16 LAB — BASIC METABOLIC PANEL
Anion gap: 7 (ref 5–15)
BUN: 8 mg/dL (ref 6–20)
CO2: 21 mmol/L — ABNORMAL LOW (ref 22–32)
CREATININE: 0.92 mg/dL (ref 0.44–1.00)
Calcium: 9.2 mg/dL (ref 8.9–10.3)
Chloride: 106 mmol/L (ref 101–111)
GFR calc Af Amer: >60 mL/min (ref >60)
GLUCOSE: 149 mg/dL — AB (ref 65–99)
Potassium: 4.7 mmol/L (ref 3.5–5.1)
Sodium: 134 mmol/L — ABNORMAL LOW (ref 135–145)

## 2017-04-16 LAB — CBC
HCT: 32.5 % — ABNORMAL LOW (ref 36.0–46.0)
Hemoglobin: 10.5 g/dL — ABNORMAL LOW (ref 12.0–15.0)
MCH: 29.4 pg (ref 26.0–34.0)
MCHC: 32.3 g/dL (ref 30.0–36.0)
MCV: 91 fL (ref 78.0–100.0)
PLATELETS: 171 10*3/uL (ref 150–400)
RBC: 3.57 MIL/uL — ABNORMAL LOW (ref 3.87–5.11)
RDW: 13.6 % (ref 11.5–15.5)
WBC: 14.1 10*3/uL — ABNORMAL HIGH (ref 4.0–10.5)

## 2017-04-16 MED ORDER — ACETAMINOPHEN 10 MG/ML IV SOLN
1000.0000 mg | Freq: Four times a day (QID) | INTRAVENOUS | Status: AC | PRN
  Administered 2017-04-16 – 2017-04-17 (×4): 1000 mg via INTRAVENOUS
  Filled 2017-04-16 (×4): qty 100

## 2017-04-16 MED ORDER — HEPARIN SODIUM (PORCINE) 5000 UNIT/ML IJ SOLN
5000.0000 [IU] | Freq: Three times a day (TID) | INTRAMUSCULAR | Status: DC
  Administered 2017-04-16 – 2017-04-20 (×12): 5000 [IU] via SUBCUTANEOUS
  Filled 2017-04-16 (×12): qty 1

## 2017-04-16 MED ORDER — LEVOTHYROXINE SODIUM 75 MCG PO TABS
75.0000 ug | ORAL_TABLET | Freq: Every day | ORAL | Status: DC
  Administered 2017-04-17 – 2017-04-20 (×4): 75 ug via ORAL
  Filled 2017-04-16 (×4): qty 1

## 2017-04-16 NOTE — Plan of Care (Signed)
No complaints of nausea, scant drainage of NG when placed on LIWS, Ng dc'ed per order, will continue to monitor. Tolerating ice chips.

## 2017-04-16 NOTE — Progress Notes (Signed)
  [facility] Progress Note  Subjective  - POD #  Appears uncomfortable this am. Having abdominal pain. Throat is better. Denies nausea. Thinks she is passing flatus.   Objective Vitals:   04/16/17 0500 04/16/17 0600  BP: 110/73 108/66  Pulse: (!) 107 92  Resp: (!) 9 12  Temp:      Intake/Output Summary (Last 24 hours) at 04/16/17 0659 Last data filed at 04/16/17 0600  Gross per 24 hour  Intake             3265 ml  Output             2870 ml  Net              395 ml   Minimal bowel sounds. Abdomen without distension. Non tender. Midline and groin incisions healing well. 2+ DP pulses bilaterally.  Assessment/Planning: 61 y.o. female is s/p: aortobifemoral bypass 2 Days Post-Op   NG output decreased. Clamped this a.m. With possible removal later today Ok for ice chips Continue mobilization with post-op shoe.  Pain is better when also getting additional dilaudid push with PCA. If ok without NG may restart some po pain meds.  Good UOP. D/c foley Will keep in ICU given pain issues.   Raymond GurneyKimberly A Trinh 04/16/2017 6:59 AM --  Laboratory CBC    Component Value Date/Time   WBC 11.9 (H) 04/15/2017 0325   HGB 9.3 (L) 04/15/2017 0325   HCT 28.3 (L) 04/15/2017 0325   PLT 146 (L) 04/15/2017 0325    BMET    Component Value Date/Time   NA 134 (L) 04/15/2017 0325   K 4.9 04/15/2017 0325   CL 107 04/15/2017 0325   CO2 22 04/15/2017 0325   GLUCOSE 206 (H) 04/15/2017 0325   BUN 15 04/15/2017 0325   CREATININE 1.05 (H) 04/15/2017 0325   CALCIUM 8.4 (L) 04/15/2017 0325   GFRNONAA 57 (L) 04/15/2017 0325   GFRAA >60 04/15/2017 0325    COAG Lab Results  Component Value Date   INR 1.12 04/14/2017   INR 1.02 04/13/2017   No results found for: PTT  Antibiotics Anti-infectives    Start     Dose/Rate Route Frequency Ordered Stop   04/14/17 2000  cefUROXime (ZINACEF) 1.5 g in dextrose 5 % 50 mL IVPB     1.5 g 100 mL/hr over 30 Minutes Intravenous  Every 12 hours 04/14/17 1317 04/15/17 0837   04/14/17 0715  cefUROXime (ZINACEF) 1.5 g in dextrose 5 % 50 mL IVPB     1.5 g 100 mL/hr over 30 Minutes Intravenous 30 min pre-op 04/13/17 1542 04/14/17 1425       Maris BergerKimberly Trinh, PA-C [facility] Office: [phone] Pager: [phone] 04/16/2017 6:59 AM  I have independently interviewed patient and agree with PA assessment and plan above.   Brandon C. Randie Heinzain, MD [facility] [place] Office: [phone] Pager: [phone]

## 2017-04-17 ENCOUNTER — Encounter (HOSPITAL_COMMUNITY): Payer: Self-pay | Admitting: Surgery

## 2017-04-17 ENCOUNTER — Inpatient Hospital Stay (HOSPITAL_COMMUNITY): Payer: BLUE CROSS/BLUE SHIELD

## 2017-04-17 DIAGNOSIS — I6523 Occlusion and stenosis of bilateral carotid arteries: Secondary | ICD-10-CM

## 2017-04-17 LAB — URINALYSIS, COMPLETE (UACMP) WITH MICROSCOPIC
Bacteria, UA: NONE SEEN
Bilirubin Urine: NEGATIVE
GLUCOSE, UA: NEGATIVE mg/dL
Hgb urine dipstick: NEGATIVE
Ketones, ur: NEGATIVE mg/dL
Leukocytes, UA: NEGATIVE
Nitrite: NEGATIVE
PH: 5 (ref 5.0–8.0)
Protein, ur: NEGATIVE mg/dL
SPECIFIC GRAVITY, URINE: 1.015 (ref 1.005–1.030)

## 2017-04-17 LAB — BASIC METABOLIC PANEL
Anion gap: 8 (ref 5–15)
BUN: 7 mg/dL (ref 6–20)
CALCIUM: 9.5 mg/dL (ref 8.9–10.3)
CO2: 23 mmol/L (ref 22–32)
CREATININE: 0.8 mg/dL (ref 0.44–1.00)
Chloride: 104 mmol/L (ref 101–111)
GFR calc Af Amer: >60 mL/min (ref >60)
GFR calc non Af Amer: >60 mL/min (ref >60)
Glucose, Bld: 159 mg/dL — ABNORMAL HIGH (ref 65–99)
POTASSIUM: 4.1 mmol/L (ref 3.5–5.1)
SODIUM: 135 mmol/L (ref 135–145)

## 2017-04-17 MED ORDER — OXYCODONE-ACETAMINOPHEN 10-325 MG PO TABS: 1.0000 | ORAL_TABLET | Freq: Three times a day (TID) | ORAL | Status: DC

## 2017-04-17 MED ORDER — OXYCODONE HCL 5 MG PO TABS
15.0000 mg | ORAL_TABLET | Freq: Three times a day (TID) | ORAL | Status: DC | PRN
  Administered 2017-04-17 – 2017-04-20 (×4): 15 mg via ORAL
  Filled 2017-04-17 (×4): qty 3

## 2017-04-17 MED ORDER — KETOROLAC TROMETHAMINE 30 MG/ML IJ SOLN
30.0000 mg | Freq: Four times a day (QID) | INTRAMUSCULAR | Status: AC
  Administered 2017-04-17 – 2017-04-20 (×9): 30 mg via INTRAVENOUS
  Filled 2017-04-17 (×10): qty 1

## 2017-04-17 MED ORDER — OXYCODONE HCL 5 MG PO TABS
5.0000 mg | ORAL_TABLET | Freq: Three times a day (TID) | ORAL | Status: DC
  Administered 2017-04-17 – 2017-04-20 (×10): 5 mg via ORAL
  Filled 2017-04-17 (×9): qty 1

## 2017-04-17 MED ORDER — VENLAFAXINE HCL ER 75 MG PO CP24
75.0000 mg | ORAL_CAPSULE | Freq: Every morning | ORAL | Status: DC
  Administered 2017-04-17 – 2017-04-20 (×4): 75 mg via ORAL
  Filled 2017-04-17 (×4): qty 1

## 2017-04-17 MED ORDER — OXYCODONE-ACETAMINOPHEN 5-325 MG PO TABS
1.0000 | ORAL_TABLET | Freq: Three times a day (TID) | ORAL | Status: DC
  Administered 2017-04-17 – 2017-04-20 (×9): 1 via ORAL
  Filled 2017-04-17 (×9): qty 1

## 2017-04-17 MED ORDER — METHOCARBAMOL 500 MG PO TABS
750.0000 mg | ORAL_TABLET | Freq: Three times a day (TID) | ORAL | Status: DC
  Administered 2017-04-17 – 2017-04-20 (×8): 750 mg via ORAL
  Filled 2017-04-17 (×9): qty 2

## 2017-04-17 NOTE — Progress Notes (Signed)
*  PRELIMINARY RESULTS* Vascular Ultrasound Carotid Duplex (Doppler) has been completed.  Preliminary findings: right 1-39% ICA stenosis, left 40-59% ICA stenosis. Antegrade vertebral flow.    Farrel DemarkJill Eunice, RDMS, RVT  04/17/2017, 3:13 PM

## 2017-04-17 NOTE — Progress Notes (Addendum)
  [facility] Progress Note  Subjective  - POD #3  Currently on bedside commode. Denies nausea. Passing some flatus. No BM.  Objective Vitals:   04/17/17 0600 04/17/17 0700  BP: 106/69 113/82  Pulse: 91 99  Resp: 13 (!) 9  Temp:      Intake/Output Summary (Last 24 hours) at 04/17/17 0730 Last data filed at 04/17/17 0700  Gross per 24 hour  Intake          1588.75 ml  Output              880 ml  Net           708.75 ml   On bedside commode. Abdomen soft and non tender.   Assessment/Planning: 61 y.o. female is s/p: aortobifemoral bypass 3 Days Post-Op   Tolerated ice chips. Will start clear liquids today. Pain management issues: Currently on PCA and IV tylenol. Will restart po pain meds today. Urinary issues: per patient, she is only able to void standing up in the shower and then rinsing herself off. Voided on scale and floor yesterday. Unsuccessful with female external catheter. She voids approximately 30 cc every 30 minutes. Will discuss urology consult with Dr. Myra GianottiBrabham.  WBC mildly elevated. Will send UA.  Incentive spirometry.  Keep in ICU today.   Raymond GurneyKimberly A Trinh 04/17/2017 7:30 AM --  Laboratory CBC    Component Value Date/Time   WBC 14.1 (H) 04/16/2017 0825   HGB 10.5 (L) 04/16/2017 0825   HCT 32.5 (L) 04/16/2017 0825   PLT 171 04/16/2017 0825    BMET    Component Value Date/Time   NA 134 (L) 04/16/2017 0825   K 4.7 04/16/2017 0825   CL 106 04/16/2017 0825   CO2 21 (L) 04/16/2017 0825   GLUCOSE 149 (H) 04/16/2017 0825   BUN 8 04/16/2017 0825   CREATININE 0.92 04/16/2017 0825   CALCIUM 9.2 04/16/2017 0825   GFRNONAA >60 04/16/2017 0825   GFRAA >60 04/16/2017 0825    COAG Lab Results  Component Value Date   INR 1.12 04/14/2017   INR 1.02 04/13/2017   No results found for: PTT  Antibiotics Anti-infectives    Start     Dose/Rate Route Frequency Ordered Stop   04/14/17 2000  cefUROXime (ZINACEF) 1.5 g in dextrose 5 % 50  mL IVPB     1.5 g 100 mL/hr over 30 Minutes Intravenous Every 12 hours 04/14/17 1317 04/15/17 0837   04/14/17 0715  cefUROXime (ZINACEF) 1.5 g in dextrose 5 % 50 mL IVPB     1.5 g 100 mL/hr over 30 Minutes Intravenous 30 min pre-op 04/13/17 1542 04/14/17 1425       Maris BergerKimberly Trinh, PA-C [facility] Office: [phone] Pager: [phone] 04/17/2017 7:30 AM   Pain control still an issue.  Will add back Toradol.  Monitor Creatinine tomorrow Requiring too much nursing attention for transfer. Monitor WBC.  UA negative.   Abd soft Palpable pedal pulses   WElls BRabham

## 2017-04-17 NOTE — Progress Notes (Signed)
Physical Therapy Treatment Patient Details Name: Ashlee Snow MRN: [id number] DOB: July 29, 1956 Today's Date: 04/17/2017    History of Present Illness Pt is a 61 y/o female s/p aorto-bifemoral bypass on 6/22. PMH including but not limited to fibromyalgia, HTN, PAD and PVD.    PT Comments    Patient tolerated ambulation well with 2L O2 via [place] and min guard for safety. Continue to progress as tolerated.   Follow Up Recommendations  Home health PT;Supervision/Assistance - 24 hour     Equipment Recommendations  None recommended by PT    Recommendations for Other Services       Precautions / Restrictions Precautions Precautions: Fall Required Braces or Orthoses: Other Brace/Splint Other Brace/Splint: R post-op shoe Restrictions Weight Bearing Restrictions: No    Mobility  Bed Mobility Overal bed mobility: Needs Assistance Bed Mobility: Supine to Sit           General bed mobility comments: assist to elevate trunk into sitting  Transfers Overall transfer level: Needs assistance Equipment used: Rolling walker (2 wheeled) Transfers: Sit to/from Stand Sit to Stand: Min guard         General transfer comment: cues for safe hand placement; min guard for safety from EOB and BSC  Ambulation/Gait Ambulation/Gait assistance: Min guard Ambulation Distance (Feet): 300 Feet Assistive device: Rolling walker (2 wheeled) Gait Pattern/deviations: Step-through pattern;Decreased stride length;Trunk flexed;Antalgic Gait velocity: decreased   General Gait Details: cues for posture and safe use of AD; pt required directional cues to navigate objects in room and hallway   Stairs            Wheelchair Mobility    Modified Rankin (Stroke Patients Only)       Balance Overall balance assessment: Needs assistance Sitting-balance support: Feet supported Sitting balance-Leahy Scale: Fair     Standing balance support: During functional activity;No upper extremity  supported Standing balance-Leahy Scale: Fair                              Cognition Arousal/Alertness: Awake/alert Behavior During Therapy: WFL for tasks assessed/performed Overall Cognitive Status: Impaired/Different from baseline Area of Impairment: Attention;Following commands;Problem solving                   Current Attention Level: Sustained   Following Commands: Follows one step commands with increased time     Problem Solving: Difficulty sequencing;Requires verbal cues;Requires tactile cues General Comments: pt drowsy and slow to process--suspect due to medications      Exercises      General Comments General comments (skin integrity, edema, etc.): VSS      Pertinent Vitals/Pain Pain Assessment: Faces Faces Pain Scale: Hurts a little bit Pain Location: back Pain Descriptors / Indicators: Guarding Pain Intervention(s): Monitored during session;Premedicated before session;Repositioned    Home Living                      Prior Function            PT Goals (current goals can now be found in the care plan section) Acute Rehab PT Goals PT Goal Formulation: With patient/family Time For Goal Achievement: 04/29/17 Potential to Achieve Goals: Good Progress towards PT goals: Progressing toward goals    Frequency    Min 3X/week      PT Plan Current plan remains appropriate    Co-evaluation  AM-PAC PT "6 Clicks" Daily Activity  Outcome Measure  Difficulty turning over in bed (including adjusting bedclothes, sheets and blankets)?: A Lot Difficulty moving from lying on back to sitting on the side of the bed? : Total Difficulty sitting down on and standing up from a chair with arms (e.g., wheelchair, bedside commode, etc,.)?: Total Help needed moving to and from a bed to chair (including a wheelchair)?: A Little Help needed walking in hospital room?: A Little Help needed climbing 3-5 steps with a railing? : A  Little 6 Click Score: 13    End of Session Equipment Utilized During Treatment: Gait belt Activity Tolerance: Patient tolerated treatment well Patient left: in chair;with call bell/phone within reach Nurse Communication: Mobility status PT Visit Diagnosis: Unsteadiness on feet (R26.81);Other abnormalities of gait and mobility (R26.89);Pain Pain - part of body:  (abdomen)     Time: 1530-1600 PT Time Calculation (min) (ACUTE ONLY): 30 min  Charges:  $Gait Training: 8-22 mins $Therapeutic Activity: 8-22 mins                    G Codes:       Erline LevineKellyn Melicia Esqueda, PTA Pager: [phone]     Carolynne EdouardKellyn R Atsushi Yom 04/17/2017, 4:59 PM

## 2017-04-17 NOTE — Evaluation (Signed)
Occupational Therapy Evaluation Patient Details Name: Ashlee Snow MRN: [id number] DOB: 09/25/1956 Today's Date: 04/17/2017    History of Present Illness Pt is a 61 y/o female s/p aorto-bifemoral bypass on 6/22. PMH including but not limited to fibromyalgia, HTN, PAD and PVD.   Clinical Impression   This 61 yo female admitted and underwent above presents to acute OT with deficits below (see OT problem list) thus affecting her PLOF of being totally independent with basic ADLs. She will benefit from acute OT with follow up OT at SNF to get back to PLOF before returning home.    Follow Up Recommendations  SNF;Supervision/Assistance - 24 hour    Equipment Recommendations  None recommended by OT       Precautions / Restrictions Precautions Precautions: Fall Required Braces or Orthoses: Other Brace/Splint Other Brace/Splint: R post-op shoe Restrictions Weight Bearing Restrictions: No      Mobility Bed Mobility               General bed mobility comments: pt sitting OOB on 3n1 upon arrival  Transfers Overall transfer level: Needs assistance Equipment used: 1 person hand held assist Transfers: Sit to/from Stand Sit to Stand: Min assist         General transfer comment: increased time, vc'ing for technique, min A for stability with rise from chair    Balance Overall balance assessment: Needs assistance Sitting-balance support: Feet supported;No upper extremity supported Sitting balance-Leahy Scale: Fair     Standing balance support: During functional activity;Single extremity supported Standing balance-Leahy Scale: Poor                             ADL either performed or assessed with clinical judgement   ADL Overall ADL's : Needs assistance/impaired Eating/Feeding: Total assistance;Sitting   Grooming: Moderate assistance;Sitting   Upper Body Bathing: Moderate assistance;Sitting   Lower Body Bathing: Maximal assistance Lower Body Bathing  Details (indicate cue type and reason): min A sit<>stand Upper Body Dressing : Total assistance;Sitting   Lower Body Dressing: Total assistance Lower Body Dressing Details (indicate cue type and reason): min A sit<>stand Toilet Transfer: Minimal assistance;Stand-pivot;BSC   Toileting- Clothing Manipulation and Hygiene: Moderate assistance Toileting - Clothing Manipulation Details (indicate cue type and reason): min A sit<>stand             Vision Patient Visual Report: No change from baseline              Pertinent Vitals/Pain Pain Assessment: Faces Faces Pain Scale: Hurts even more Pain Location: abdomen and across shoulders/lower neck Pain Descriptors / Indicators: Grimacing;Guarding;Sore;Aching Pain Intervention(s): Monitored during session;Repositioned;PCA encouraged;Premedicated before session     Hand Dominance Right   Extremity/Trunk Assessment Upper Extremity Assessment Upper Extremity Assessment: Generalized weakness           Communication Communication Communication: No difficulties   Cognition Arousal/Alertness: Lethargic;Suspect due to medications Behavior During Therapy: Flat affect Overall Cognitive Status: No family/caregiver present to determine baseline cognitive functioning Area of Impairment: Following commands;Safety/judgement;Problem solving                       Following Commands: Follows one step commands with increased time Safety/Judgement: Decreased awareness of safety;Decreased awareness of deficits (with lines with transfers--not really paying attention to them)   Problem Solving: Slow processing;Decreased initiation;Difficulty sequencing;Requires verbal cues;Requires tactile cues                Home  Living Family/patient expects to be discharged to:: Private residence Living Arrangements: Spouse/significant other Available Help at Discharge: Family;Available 24 hours/day Type of Home: House Home Access: Stairs to  enter [employer] of Steps: 1   Home Layout: One level     Bathroom Shower/Tub: Chief Strategy Officer: Standard     Home Equipment: Environmental consultant - 2 wheels;Cane - single point;Bedside commode;Shower seat;Grab bars - tub/shower;Hand held shower head          Prior Functioning/Environment Level of Independence: Independent with assistive device(s)        Comments: pt reported that she was ambulating with use of RW all the time secondary to pain        OT Problem List: Decreased strength;Decreased range of motion;Decreased activity tolerance;Impaired balance (sitting and/or standing);Decreased cognition;Pain      OT Treatment/Interventions: Self-care/ADL training;Therapeutic activities    OT Goals(Current goals can be found in the care plan section) Acute Rehab OT Goals Patient Stated Goal: to call my husband (which I helped her do) OT Goal Formulation: With patient Time For Goal Achievement: 05/01/17 Potential to Achieve Goals: Good  OT Frequency: Min 2X/week              AM-PAC PT "6 Clicks" Daily Activity     Outcome Measure Help from another person eating meals?: A Lot Help from another person taking care of personal grooming?: A Lot Help from another person toileting, which includes using toliet, bedpan, or urinal?: A Lot Help from another person bathing (including washing, rinsing, drying)?: A Lot Help from another person to put on and taking off regular upper body clothing?: A Lot Help from another person to put on and taking off regular lower body clothing?: A Lot 6 Click Score: 12   End of Session Nurse Communication:  (pt did urinate)  Activity Tolerance: Patient limited by lethargy;Patient limited by pain Patient left: in chair;with call bell/phone within reach;with chair alarm set  OT Visit Diagnosis: Unsteadiness on feet (R26.81);Pain;Muscle weakness (generalized) (M62.81) Pain - part of body:  (abdomen and across shoulders)                 Time: 6962-9528 OT Time Calculation (min): 16 min Charges:  OT General Charges $OT Visit: 1 Procedure OT Evaluation $OT Eval Moderate Complexity: 1 Procedure Ignacia Palma, OTR/L [phone] 04/17/2017

## 2017-04-18 LAB — CBC
HCT: 26 % — ABNORMAL LOW (ref 36.0–46.0)
Hemoglobin: 8.6 g/dL — ABNORMAL LOW (ref 12.0–15.0)
MCH: 29.4 pg (ref 26.0–34.0)
MCHC: 33.1 g/dL (ref 30.0–36.0)
MCV: 88.7 fL (ref 78.0–100.0)
Platelets: 149 10*3/uL — ABNORMAL LOW (ref 150–400)
RBC: 2.93 MIL/uL — ABNORMAL LOW (ref 3.87–5.11)
RDW: 13 % (ref 11.5–15.5)
WBC: 6.3 10*3/uL (ref 4.0–10.5)

## 2017-04-18 LAB — URINE CULTURE: CULTURE: NO GROWTH

## 2017-04-18 NOTE — Care Management Note (Signed)
Case Management Note Donn PieriniKristi Layton Tappan RN, BSN Unit 2W-Case Manager-- 2H coverage [phone]  Patient Details  Name: Ashlee Snow MRN: [id number] Date of Birth: 02-Apr-1956  Subjective/Objective:  Pt admitted s/p aorto-bifemoral bypass on 6/22                Action/Plan: PTA pt lived at home with spouse- [facility]- will need HH orders prior to discharge  Expected Discharge Date:                  Expected Discharge Plan:  Home w Home Health Services  In-House Referral:     Discharge planning Services  CM Consult  Post Acute Care Choice:  Home Health Choice offered to:     DME Arranged:    DME Agency:     HH Arranged:    HH Agency:     Status of Service:  In process, will continue to follow  If discussed at Long Length of Stay Meetings, dates discussed:    Discharge Disposition:   Additional Comments:  Darrold SpanWebster, Gaynell Eggleton Hall, RN 04/18/2017, 11:05 AM

## 2017-04-18 NOTE — Progress Notes (Signed)
  [facility] Progress Note  Subjective  - POD #4  Pain is a little better today. Had some nausea yesterday with liquids. Also had pudding and crackers per RN. Passing flatus. No BM. No issues with voiding.   Objective Vitals:   04/18/17 0700 04/18/17 0804  BP: (!) 96/46 (!) 106/51  Pulse: 77 73  Resp: 16 (!) 24  Temp:  97.6 F (36.4 C)    Intake/Output Summary (Last 24 hours) at 04/18/17 0946 Last data filed at 04/18/17 0500  Gross per 24 hour  Intake           1537.6 ml  Output             1250 ml  Net            287.6 ml   Abdomen soft without distension. No tenderness to palpation. Hypoactive bowel sounds.  Incisions healing well. 2+ DP pulses bilaterally  Assessment/Planning: 61 y.o. female is s/p: aortobifemoral bypass 4 Days Post-Op   Had some intermittent nausea with liquids yesterday. No nausea this am.  Will continue clears today and advance tomorrow if no nausea today.  Pain seems to be better on home pain medication and toradol. Did not need PCA last night. Will d/c PCA. WBC normal today. UA negative.  Continue to mobilize. Ok to transfer out of ICU today.   Raymond GurneyKimberly A Trinh 04/18/2017 9:46 AM --  Laboratory CBC    Component Value Date/Time   WBC 6.3 04/18/2017 0343   HGB 8.6 (L) 04/18/2017 0343   HCT 26.0 (L) 04/18/2017 0343   PLT 149 (L) 04/18/2017 0343    BMET    Component Value Date/Time   NA 135 04/17/2017 0818   K 4.1 04/17/2017 0818   CL 104 04/17/2017 0818   CO2 23 04/17/2017 0818   GLUCOSE 159 (H) 04/17/2017 0818   BUN 7 04/17/2017 0818   CREATININE 0.80 04/17/2017 0818   CALCIUM 9.5 04/17/2017 0818   GFRNONAA >60 04/17/2017 0818   GFRAA >60 04/17/2017 0818    COAG Lab Results  Component Value Date   INR 1.12 04/14/2017   INR 1.02 04/13/2017   No results found for: PTT  Antibiotics Anti-infectives    Start     Dose/Rate Route Frequency Ordered Stop   04/14/17 2000  cefUROXime (ZINACEF) 1.5 g in  dextrose 5 % 50 mL IVPB     1.5 g 100 mL/hr over 30 Minutes Intravenous Every 12 hours 04/14/17 1317 04/15/17 0837   04/14/17 0715  cefUROXime (ZINACEF) 1.5 g in dextrose 5 % 50 mL IVPB     1.5 g 100 mL/hr over 30 Minutes Intravenous 30 min pre-op 04/13/17 1542 04/14/17 1425       Maris BergerKimberly Trinh, PA-C [facility] Office: [phone] Pager: [phone] 04/18/2017 9:46 AM   Looks much better this afternoon.  Her pain is tolerable.  We will advance her diet and transfer her to the floor.  Her incisions are c/d/i.  She has palpable pedal pulses, and her abdomen is soft.  I anticipate d/c Thursday or Friday.  WElls Chrisanne Loose

## 2017-04-19 ENCOUNTER — Encounter (HOSPITAL_COMMUNITY): Payer: BLUE CROSS/BLUE SHIELD

## 2017-04-19 ENCOUNTER — Ambulatory Visit (HOSPITAL_COMMUNITY): Payer: BLUE CROSS/BLUE SHIELD

## 2017-04-19 LAB — CBC
HCT: 26.6 % — ABNORMAL LOW (ref 36.0–46.0)
Hemoglobin: 8.9 g/dL — ABNORMAL LOW (ref 12.0–15.0)
MCH: 29.7 pg (ref 26.0–34.0)
MCHC: 33.5 g/dL (ref 30.0–36.0)
MCV: 88.7 fL (ref 78.0–100.0)
Platelets: 166 10*3/uL (ref 150–400)
RBC: 3 MIL/uL — ABNORMAL LOW (ref 3.87–5.11)
RDW: 13.3 % (ref 11.5–15.5)
WBC: 5.7 10*3/uL (ref 4.0–10.5)

## 2017-04-19 LAB — BASIC METABOLIC PANEL
ANION GAP: 6 (ref 5–15)
BUN: 12 mg/dL (ref 6–20)
CALCIUM: 8.6 mg/dL — AB (ref 8.9–10.3)
CO2: 23 mmol/L (ref 22–32)
CREATININE: 0.85 mg/dL (ref 0.44–1.00)
Chloride: 105 mmol/L (ref 101–111)
GFR calc Af Amer: >60 mL/min (ref >60)
Glucose, Bld: 111 mg/dL — ABNORMAL HIGH (ref 65–99)
Potassium: 4.4 mmol/L (ref 3.5–5.1)
Sodium: 134 mmol/L — ABNORMAL LOW (ref 135–145)

## 2017-04-19 MED ORDER — ASPIRIN 81 MG PO CHEW
81.0000 mg | CHEWABLE_TABLET | Freq: Two times a day (BID) | ORAL | Status: DC
  Administered 2017-04-19 – 2017-04-20 (×3): 81 mg via ORAL
  Filled 2017-04-19 (×3): qty 1

## 2017-04-19 MED ORDER — BISACODYL 10 MG RE SUPP: 10.0000 mg | Freq: Every day | RECTAL | Status: DC | PRN

## 2017-04-19 MED ORDER — LISINOPRIL 10 MG PO TABS
20.0000 mg | ORAL_TABLET | Freq: Every morning | ORAL | Status: DC
  Administered 2017-04-19 – 2017-04-20 (×2): 20 mg via ORAL
  Filled 2017-04-19 (×2): qty 2

## 2017-04-19 MED ORDER — METOPROLOL SUCCINATE ER 25 MG PO TB24
25.0000 mg | ORAL_TABLET | Freq: Every morning | ORAL | Status: DC
Start: 2017-04-19 — End: 2017-04-20
  Administered 2017-04-19 – 2017-04-20 (×2): 25 mg via ORAL
  Filled 2017-04-19 (×2): qty 1

## 2017-04-19 MED ORDER — BISACODYL 10 MG RE SUPP
10.0000 mg | Freq: Once | RECTAL | Status: DC
  Filled 2017-04-19: qty 1

## 2017-04-19 NOTE — Progress Notes (Signed)
C/O dry eyes and H/A. Tylenol administered for H/A and Saline flushed into eyes for dryness with effect.

## 2017-04-19 NOTE — Progress Notes (Signed)
Occupational Therapy Treatment and Discharge Patient Details Name: Ashlee Snow MRN: [id number] DOB: [birth year] Today's Date: 04/19/2017    History of present illness Pt is a 61 y/o female s/p aorto-bifemoral bypass on 6/22. PMH including but not limited to fibromyalgia, HTN, PAD and PVD.   OT comments  Pt demonstrated ability to perform self care modified independently. Pt requires supervision for safety with tub transfer, will have family available to help. No further OT needs  Follow Up Recommendations  No OT follow up    Equipment Recommendations  3 in 1 bedside commode    Recommendations for Other Services      Precautions / Restrictions Precautions Precautions: None Required Braces or Orthoses: Other Brace/Splint Other Brace/Splint: R post-op shoe Restrictions Weight Bearing Restrictions: No       Mobility Bed Mobility Overal bed mobility: Modified Independent Bed Mobility: Supine to Sit     Supine to sit: Modified independent (Device/Increase time)        Transfers Overall transfer level: Modified independent Equipment used: Rolling walker (2 wheeled) Transfers: Sit to/from Stand Sit to Stand: Modified independent (Device/Increase time)              Balance Overall balance assessment: Needs assistance Sitting-balance support: Feet supported Sitting balance-Leahy Scale: Normal     Standing balance support: During functional activity;No upper extremity supported Standing balance-Leahy Scale: Fair                             ADL either performed or assessed with clinical judgement   ADL Overall ADL's : Needs assistance/impaired Eating/Feeding: Independent;Sitting   Grooming: Modified independent;Standing   Upper Body Bathing: Independent;Sitting   Lower Body Bathing: Modified independent;Sit to/from stand   Upper Body Dressing : Independent;Sitting   Lower Body Dressing: Modified independent;Sit to/from stand   Toilet  Transfer: Modified Independent;Ambulation   Toileting- Clothing Manipulation and Hygiene: Modified independent;Sit to/from stand   Tub/ Shower Transfer: Supervision/safety;Ambulation;Shower seat;Tub Product manager Details (indicate cue type and reason): husband can supervise Functional mobility during ADLs: Modified independent;Rolling walker       Vision       Perception     Praxis      Cognition Arousal/Alertness: Awake/alert Behavior During Therapy: WFL for tasks assessed/performed Overall Cognitive Status: Within Functional Limits for tasks assessed                                          Exercises     Shoulder Instructions       General Comments      Pertinent Vitals/ Pain       Pain Assessment: Faces Faces Pain Scale: Hurts a little bit Pain Location: buttocks Pain Descriptors / Indicators: Aching Pain Intervention(s): Monitored during session;Repositioned  Home Living                                          Prior Functioning/Environment              Frequency           Progress Toward Goals  OT Goals(current goals can now be found in the care plan section)  Progress towards OT goals: Goals met/education completed, patient discharged from OT  Acute Rehab  OT Goals Patient Stated Goal: to go home tomorrow  Plan Discharge plan needs to be updated    Co-evaluation                 AM-PAC PT "6 Clicks" Daily Activity     Outcome Measure   Help from another person eating meals?: None Help from another person taking care of personal grooming?: None Help from another person toileting, which includes using toliet, bedpan, or urinal?: None Help from another person bathing (including washing, rinsing, drying)?: None Help from another person to put on and taking off regular upper body clothing?: None Help from another person to put on and taking off regular lower body clothing?: None 6  Click Score: 24    End of Session Equipment Utilized During Treatment: Rolling walker  OT Visit Diagnosis: Unsteadiness on feet (R26.81);Pain;Muscle weakness (generalized) (M62.81)   Activity Tolerance Patient tolerated treatment well   Patient Left in bed;with call bell/phone within reach   Nurse Communication          Time: 7793-9688 OT Time Calculation (min): 17 min  Charges: OT General Charges $OT Visit: 1 Procedure OT Treatments $Self Care/Home Management : 8-22 mins   Malka So 04/19/2017, 1:35 PM  [phone]

## 2017-04-19 NOTE — Progress Notes (Signed)
Physical Therapy Treatment Patient Details Name: Ashlee Snow MRN: [id number] DOB: 03/22/1956 Today's Date: 04/19/2017    History of Present Illness Pt is a 61 y/o female s/p aorto-bifemoral bypass on 6/22. PMH including but not limited to fibromyalgia, HTN, PAD and PVD.    PT Comments    Pt doing well with mobility and no further PT needed.       Follow Up Recommendations  Supervision - Intermittent     Equipment Recommendations  3in1 (PT)    Recommendations for Other Services       Precautions / Restrictions Precautions Precautions: None Required Braces or Orthoses: Other Brace/Splint Other Brace/Splint: R post-op shoe Restrictions Weight Bearing Restrictions: No    Mobility  Bed Mobility Overal bed mobility: Modified Independent Bed Mobility: Supine to Sit     Supine to sit: Modified independent (Device/Increase time)        Transfers Overall transfer level: Modified independent Equipment used: Rolling walker (2 wheeled) Transfers: Sit to/from Stand Sit to Stand: Modified independent (Device/Increase time)            Ambulation/Gait Ambulation/Gait assistance: Modified independent (Device/Increase time) Ambulation Distance (Feet): 500 Feet Assistive device: Rolling walker (2 wheeled) Gait Pattern/deviations: Step-through pattern;Decreased stride length     General Gait Details: Steady gait with walker   Stairs Stairs: Yes   Stair Management: One rail Right;Alternating pattern;Step to pattern;Forwards Number of Stairs: 12    Wheelchair Mobility    Modified Rankin (Stroke Patients Only)       Balance Overall balance assessment: Needs assistance Sitting-balance support: Feet supported Sitting balance-Leahy Scale: Normal     Standing balance support: During functional activity;No upper extremity supported Standing balance-Leahy Scale: Fair                              Cognition Arousal/Alertness:  Awake/alert Behavior During Therapy: WFL for tasks assessed/performed Overall Cognitive Status: Within Functional Limits for tasks assessed                                        Exercises      General Comments        Pertinent Vitals/Pain Pain Assessment: Faces Faces Pain Scale: Hurts a little bit Pain Location: buttocks Pain Descriptors / Indicators: Aching Pain Intervention(s): Limited activity within patient's tolerance    Home Living                      Prior Function            PT Goals (current goals can now be found in the care plan section) Progress towards PT goals: Goals met/education completed, patient discharged from PT    Frequency    Min 3X/week      PT Plan Discharge plan needs to be updated    Co-evaluation              AM-PAC PT "6 Clicks" Daily Activity  Outcome Measure  Difficulty turning over in bed (including adjusting bedclothes, sheets and blankets)?: None Difficulty moving from lying on back to sitting on the side of the bed? : None Difficulty sitting down on and standing up from a chair with arms (e.g., wheelchair, bedside commode, etc,.)?: None Help needed moving to and from a bed to chair (including a wheelchair)?: None Help needed walking in hospital  room?: None Help needed climbing 3-5 steps with a railing? : None 6 Click Score: 24    End of Session Equipment Utilized During Treatment: Gait belt Activity Tolerance: Patient tolerated treatment well Patient left: with call bell/phone within reach;in bed Nurse Communication: Mobility status PT Visit Diagnosis: Unsteadiness on feet (R26.81);Other abnormalities of gait and mobility (R26.89);Pain     Time: 1130-1140 PT Time Calculation (min) (ACUTE ONLY): 10 min  Charges:  $Gait Training: 8-22 mins                    G Codes:       [facility] [place] 04/19/2017, 12:17 PM

## 2017-04-19 NOTE — Progress Notes (Signed)
  [facility] Progress Note  Subjective  - POD #5  Pain improved. No BM yet. No nausea.  Objective Vitals:   04/19/17 0042 04/19/17 0431  BP: (!) 118/54 (!) 116/48  Pulse: 92 72  Resp:  18  Temp:  97.7 F (36.5 C)    Intake/Output Summary (Last 24 hours) at 04/19/17 0807 Last data filed at 04/18/17 2301  Gross per 24 hour  Intake             1710 ml  Output             1100 ml  Net              610 ml   Abdomen soft and non tender. Incisions healing well. 2+ DP pulses bilaterally.   Assessment/Planning: 61 y.o. female is s/p: aortobifemoral bypass 5 Days Post-Op   Pain much better.  Advance to regular diet.  Dry gauze to groins twice daily to wick moisture. Continue mobilization.  Anticipate discharge tomorrow if pain ok and tolerating regular diet.   Raymond GurneyKimberly A Trinh 04/19/2017 8:07 AM --  Laboratory CBC    Component Value Date/Time   WBC 5.7 04/19/2017 0340   HGB 8.9 (L) 04/19/2017 0340   HCT 26.6 (L) 04/19/2017 0340   PLT 166 04/19/2017 0340    BMET    Component Value Date/Time   NA 134 (L) 04/19/2017 0340   K 4.4 04/19/2017 0340   CL 105 04/19/2017 0340   CO2 23 04/19/2017 0340   GLUCOSE 111 (H) 04/19/2017 0340   BUN 12 04/19/2017 0340   CREATININE 0.85 04/19/2017 0340   CALCIUM 8.6 (L) 04/19/2017 0340   GFRNONAA >60 04/19/2017 0340   GFRAA >60 04/19/2017 0340    COAG Lab Results  Component Value Date   INR 1.12 04/14/2017   INR 1.02 04/13/2017   No results found for: PTT  Antibiotics Anti-infectives    Start     Dose/Rate Route Frequency Ordered Stop   04/14/17 2000  cefUROXime (ZINACEF) 1.5 g in dextrose 5 % 50 mL IVPB     1.5 g 100 mL/hr over 30 Minutes Intravenous Every 12 hours 04/14/17 1317 04/15/17 0837   04/14/17 0715  cefUROXime (ZINACEF) 1.5 g in dextrose 5 % 50 mL IVPB     1.5 g 100 mL/hr over 30 Minutes Intravenous 30 min pre-op 04/13/17 1542 04/14/17 1425       Maris BergerKimberly Trinh, PA-C [facility] Office: [phone] Pager: [phone] 04/19/2017 8:07 AM

## 2017-04-20 MED ORDER — OXYCODONE-ACETAMINOPHEN 10-325 MG PO TABS: 1.0000 | ORAL_TABLET | Freq: Four times a day (QID) | ORAL | 0 refills | Status: AC | PRN

## 2017-04-20 MED ORDER — NICOTINE 14 MG/24HR TD PT24: 14.0000 mg | MEDICATED_PATCH | TRANSDERMAL | 0 refills | Status: AC

## 2017-04-20 NOTE — Progress Notes (Addendum)
  [facility] Progress Note  Subjective  - POD #6  Tolerated regular diet. No nausea. Had 2 BMs yesterday. Pain is fine.   Objective Vitals:   04/19/17 2009 04/20/17 0501  BP: (!) 118/51 (!) 129/55  Pulse: 90 81  Resp: 18 18  Temp: 98.8 F (37.1 C) 98.1 F (36.7 C)    Intake/Output Summary (Last 24 hours) at 04/20/17 0737 Last data filed at 04/19/17 1800  Gross per 24 hour  Intake              720 ml  Output                0 ml  Net              720 ml   Abdomen minimal tenderness around incision. Soft without distension. Incisions clean and intact. 2+ DP pulses bilaterally. Dry ulcer to plantar aspect of right great toe.   Assessment/Planning: 61 y.o. female is s/p: aortobifemoral bypass 6 Days Post-Op   Pain is well controlled. Tolerated regular diet.  Having BMs.  Ambulating well. D/c home today. Has prescription for pain medication through pain physician already. F/u in 2 weeks with Dr. Myra GianottiBrabham.   Raymond GurneyKimberly A Suman Trivedi 04/20/2017 7:37 AM -- Addendum Will prescribe prn percocet 10/325 mg as patient only has oxycodone 15 mg at home.   Maris BergerKimberly Kenlynn Houde, PA-C  Laboratory CBC    Component Value Date/Time   WBC 5.7 04/19/2017 0340   HGB 8.9 (L) 04/19/2017 0340   HCT 26.6 (L) 04/19/2017 0340   PLT 166 04/19/2017 0340    BMET    Component Value Date/Time   NA 134 (L) 04/19/2017 0340   K 4.4 04/19/2017 0340   CL 105 04/19/2017 0340   CO2 23 04/19/2017 0340   GLUCOSE 111 (H) 04/19/2017 0340   BUN 12 04/19/2017 0340   CREATININE 0.85 04/19/2017 0340   CALCIUM 8.6 (L) 04/19/2017 0340   GFRNONAA >60 04/19/2017 0340   GFRAA >60 04/19/2017 0340    COAG Lab Results  Component Value Date   INR 1.12 04/14/2017   INR 1.02 04/13/2017   No results found for: PTT  Antibiotics Anti-infectives    Start     Dose/Rate Route Frequency Ordered Stop   04/14/17 2000  cefUROXime (ZINACEF) 1.5 g in dextrose 5 % 50 mL IVPB     1.5 g 100 mL/hr over 30  Minutes Intravenous Every 12 hours 04/14/17 1317 04/15/17 0837   04/14/17 0715  cefUROXime (ZINACEF) 1.5 g in dextrose 5 % 50 mL IVPB     1.5 g 100 mL/hr over 30 Minutes Intravenous 30 min pre-op 04/13/17 1542 04/14/17 1425       Maris BergerKimberly Yonatan Guitron, PA-C [facility] Office: [phone] Pager: [phone] 04/20/2017 7:37 AM

## 2017-04-20 NOTE — Discharge Summary (Signed)
[facility] Discharge Summary  Ashlee Snow [birth year] 61 y.o. female  [id number]  Admission Date: 04/14/2017  Discharge Date: 04/20/2017  Physician: Juleen China, MD  Admission Diagnosis: Peripheral Vascular Disease   HPI:   This is a 61 y.o. female who initially underwent a right common iliac stent in 2007 by Dr. Madilyn Fireman for right leg claudication. She had a recurrence of her symptoms in 2011 and underwent stenting of her right common and external iliac artery. In 2012, she was found to have elevated velocities and decreased ankle-brachial indices and therefore she underwent angiography which showeda recurrent stenosis. This was unresponsive to balloon angioplasty and therefore was treated with a covered stent.  She underwent surgery for her lower back as she was also having symptoms consistent with neuropathic back pain.  Since Christmas she has been limited to minimal activity. She states she gets a pain in her right groin that radiates around to her back. She also has early right leg giving out after taking just a few steps.  She recently fell and is complaining of right foot pain.  She also was picking at a ingrown toenail and somehow has created a wound from the bandage on the bottom of her right great toe.  She is having difficulty putting weight on her foot.  She was recently evaluated for chest pain.  She has a Myoview pending she takes a statin for hypercholesterolemia and is on ACE inhibitor for hypertension.  She is a current smoker.  There is a significant family history of cardiovascular disease.  Hospital Course:  The patient was admitted to the hospital and taken to the operating room on 04/14/2017 and underwent: aortobifemoral bypass  The patient tolerated the procedure well and was transported to the PACU in stable condition. She was extubated in the OR.  She was stable post-operatively. Her main issues were pain control and she  was started on IV toradol and dilaudid PCA. Her NG tube was clamped on POD 2. Was started on clear liquids on POD 3. Did have some nausea but no vomiting. Had a mildly elevated WBC on POD 3. UA was negative. WBC was normal on POD 4. Diet advanced on POD 4 and was transferred to the floor. Incisions were healing well with palpable pedal pulses. The remainder of her hospitalization consisted of increasing mobilization, advancing diet and weaning off IV pain medication. By POD 6, she was tolerating a regular diet and had a BM. She no longer need IV pain medication. She was discharged home on POD 6 in good condition.   CBC    Component Value Date/Time   WBC 5.7 04/19/2017 0340   RBC 3.00 (L) 04/19/2017 0340   HGB 8.9 (L) 04/19/2017 0340   HCT 26.6 (L) 04/19/2017 0340   PLT 166 04/19/2017 0340   MCV 88.7 04/19/2017 0340   MCH 29.7 04/19/2017 0340   MCHC 33.5 04/19/2017 0340   RDW 13.3 04/19/2017 0340   LYMPHSABS 2.1 07/28/2014 2229   MONOABS 0.5 07/28/2014 2229   EOSABS 0.2 07/28/2014 2229   BASOSABS 0.0 07/28/2014 2229    BMET    Component Value Date/Time   NA 134 (L) 04/19/2017 0340   K 4.4 04/19/2017 0340   CL 105 04/19/2017 0340   CO2 23 04/19/2017 0340   GLUCOSE 111 (H) 04/19/2017 0340   BUN 12 04/19/2017 0340   CREATININE 0.85 04/19/2017 0340   CALCIUM 8.6 (L) 04/19/2017 0340   GFRNONAA >60 04/19/2017 0340  GFRAA >60 04/19/2017 0340     Discharge Instructions:   The patient is discharged to home with extensive instructions on wound care and progressive ambulation. They are instructed not to drive or perform any heavy lifting until returning to see the physician in his office.  Discharge Instructions    ABDOMINAL PROCEDURE/ANEURYSM REPAIR/AORTO-BIFEMORAL BYPASS:  Call MD for increased abdominal pain; cramping diarrhea; nausea/vomiting    Complete by:  As directed    Call MD for:  redness, tenderness, or signs of infection (pain, swelling, bleeding, redness, odor or  green/yellow discharge around incision site)    Complete by:  As directed    Call MD for:  severe or increased pain, loss or decreased feeling  in affected limb(s)    Complete by:  As directed    Call MD for:  temperature >100.5    Complete by:  As directed    Discharge wound care:    Complete by:  As directed    Wash the groin wounds and stomach wounds with soap and water daily and pat dry. (No tub bath-only shower)  Then put a dry gauze or washcloth to groins to keep this area dry daily and as needed.  Do not use Vaseline or neosporin on your incisions.  Only use soap and water on your incisions and then protect and keep dry.  You can get your right foot wound wet in the shower. Wash with soap and water daily and pat dry. You can paint a thin layer of betadine to your wound. You do not have to apply a dressing on top.   Driving Restrictions    Complete by:  As directed    No driving for 2 weeks   Increase activity slowly    Complete by:  As directed    Walk with assistance as needed. Stay active and walk as much as tolerated. Use post-op shoe on right to walk.   Lifting restrictions    Complete by:  As directed    No lifting greater than gallon of milk for 6 weeks   Resume previous diet    Complete by:  As directed       Discharge Diagnosis:  Peripheral Vascular Disease   Secondary Diagnosis: Patient Active Problem List   Diagnosis Date Noted  . 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 Diarrhea 06/05/2016  . Elevated LFTs 05/27/2016  . Burning sensation of the foot 12/22/2014  . Pain in limb 12/22/2014  . Peripheral vascular disease, unspecified (HCC) 02/06/2012  . WEIGHT LOSS 07/02/2009  . ABDOMINAL PAIN 07/02/2009  . Personal history of other diseases of digestive system 07/01/2009   Past Medical History:  Diagnosis Date  . Asthma   . Bronchitis   . DDD (degenerative disc disease)   . Depression   . Dyspnea    on exertion  . Fibromyalgia   . GERD  (gastroesophageal reflux disease)    hx. of   . History of depression   . Hypertension   . Hypothyroidism   . Leg pain   . Migraine   . Peripheral arterial disease (HCC)   . Peripheral vascular disease (HCC)   . Sciatica   . Thyroid disease   . 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 Ulcer      Allergies as of 04/20/2017      Reactions   Ativan [lorazepam]       Medication List    STOP taking these  medications   sulfamethoxazole-trimethoprim 400-80 MG tablet Commonly known as:  BACTRIM,SEPTRA     TAKE these medications   BLACK COHOSH PO Take 3 capsules by mouth daily.   ergocalciferol 50000 units capsule Commonly known as:  VITAMIN D2 Take 50,000 Units by mouth once a week. Monday   HM ASPIRIN 81 MG chewable tablet Generic drug:  aspirin Chew 81 mg by mouth 2 (two) times daily.   levothyroxine 75 MCG tablet Commonly known as:  SYNTHROID, LEVOTHROID Take 75 mcg by mouth daily before breakfast.   lisinopril 20 MG tablet Commonly known as:  PRINIVIL,ZESTRIL Take 20 mg by mouth every morning.   LYRICA 150 MG capsule Generic drug:  pregabalin Take 150 mg by mouth 2 (two) times daily.   methocarbamol 750 MG tablet Commonly known as:  ROBAXIN Take 750 mg by mouth 3 (three) times daily.   metoprolol succinate 25 MG 24 hr tablet Commonly known as:  TOPROL-XL Take 25 mg by mouth every morning.   nicotine 14 mg/24hr patch Commonly known as:  NICODERM CQ - dosed in mg/24 hours Place 1 patch (14 mg total) onto the skin daily.   oxyCODONE 15 MG immediate release tablet Commonly known as:  ROXICODONE Take 15 mg by mouth every 8 (eight) hours as needed for pain.   oxyCODONE-acetaminophen 10-325 MG tablet Commonly known as:  PERCOCET Take 1 tablet by mouth every 6 (six) hours as needed for pain. What changed:  when to take this  reasons to take this   pravastatin 20 MG tablet Commonly known as:  PRAVACHOL Take 20 mg  by mouth every morning.   promethazine 25 MG tablet Commonly known as:  PHENERGAN Take 1 tablet by mouth every 6 (six) hours as needed.   venlafaxine XR 75 MG 24 hr capsule Commonly known as:  EFFEXOR-XR Take 75 mg by mouth every morning.       Percocet 10/325 #30 No Refill  Disposition: Home  Patient's condition: is Good  Follow up: 1. Dr. Myra Gianotti in 2 weeks   Maris Berger, PA-C [facility] [phone] 04/20/2017  3:29 PM   - For VQI Registry use ---   Post-op:  Time to Extubation: [ ]  In OR, [ ]  < 12 hrs, [ ]  12-24 hrs, [ ]  >=24 hrs Vasopressors Req. Post-op: No ICU Stay: 3 days Transfusion: No   MI: No, [ ]  Troponin only, [ ]  EKG or Clinical New Arrhythmia: No  Complications: CHF: No Resp failure: No, [ ]  Pneumonia, [ ]  Ventilator Chg in renal function: No, [ ]  Inc. Cr > 0.5, [ ]  Temp. Dialysis, [ ]  Permanent dialysis Leg ischemia: No, no Surgery needed, [ ]  Yes, Surgery needed, [ ]  Amputation Bowel ischemia: No, [ ]  Medical Rx, [ ]  Surgical Rx Wound complication: No, [ ]  Superficial separation/infection, [ ]  Return to OR Return to OR: No  Return to OR for bleeding: No Stroke: No, [ ]  Minor, [ ]  Major  Discharge medications: Statin use:  Yes If No: [ ]  For Medical reasons, [ ]  Non-compliant ASA use:  Yes  If No: [ ]  For Medical reasons, [ ]  Non-compliant Plavix use:  No If No: [ ]  For Medical reasons, [ ]  Non-compliant Beta blocker use:  Yes If No: [ ]  For Medical reasons, [ ]  Non-compliant

## 2017-04-20 NOTE — Care Management Note (Signed)
Case Management Note Donn PieriniKristi Oran Dillenburg RN, BSN Unit 2W-Case Manager-- 2H coverage [phone]  Patient Details  Name: Ashlee Snow MRN: [id number] Date of Birth: [birth year]  Subjective/Objective:  Pt admitted s/p aorto-bifemoral bypass on 6/22                Action/Plan: PTA pt lived at home with spouse- [facility] CM will follow- will need HH orders prior to discharge  Expected Discharge Date:  04/20/17               Expected Discharge Plan:  Home w Home Health Services  In-House Referral:     Discharge planning Services  CM Consult  Post Acute Care Choice:  Home Health, Durable Medical Equipment Choice offered to:  Patient  DME Arranged:  3-N-1 DME Agency:  [facility]  HH Arranged:  NA HH Agency:  NA  Status of Service:  Completed, signed off  If discussed at Long Length of Stay Meetings, dates discussed:  6/28  Discharge Disposition: home/self care   Additional Comments:  04/20/17- 0945- Donn PieriniKristi Forney Kleinpeter RN, CM- pt for d/c home today- per updated PT notes- no recommendations for f/u at discharge- [facility] 3n[facility]  For DME needs- 3n1 has been delivered to room.   Darrold SpanWebster, Kasidee Voisin Hall, RN 04/20/2017, 9:44 AM

## 2017-04-21 ENCOUNTER — Telehealth: Payer: Self-pay | Admitting: Cardiology

## 2017-04-21 LAB — BPAM RBC
BLOOD PRODUCT EXPIRATION DATE: 201807192359
Blood Product Expiration Date: 201807182359
ISSUE DATE / TIME: 201806241110
UNIT TYPE AND RH: 5100
Unit Type and Rh: 5100

## 2017-04-21 LAB — TYPE AND SCREEN
ABO/RH(D): O POS
ANTIBODY SCREEN: NEGATIVE
UNIT DIVISION: 0
Unit division: 0

## 2017-04-21 NOTE — Telephone Encounter (Signed)
Message fwd to Dr. Wyline MoodBranch to see when MD prefers follow up.

## 2017-04-21 NOTE — Telephone Encounter (Signed)
She would like to know if she needs to follow up with Dr Wyline MoodBranch.  Just got out of hospital.  Had open heart surgery.

## 2017-04-24 ENCOUNTER — Telehealth: Payer: Self-pay | Admitting: Family

## 2017-04-24 ENCOUNTER — Encounter: Payer: Self-pay | Admitting: Family

## 2017-04-24 ENCOUNTER — Ambulatory Visit (INDEPENDENT_AMBULATORY_CARE_PROVIDER_SITE_OTHER): Payer: Self-pay | Admitting: Family

## 2017-04-24 VITALS — BP 119/74 | HR 83 | Temp 99.4°F | Resp 16 | Ht 64.0 in | Wt 144.0 lb

## 2017-04-24 DIAGNOSIS — I70238 Atherosclerosis of native arteries of right leg with ulceration of other part of lower right leg: Secondary | ICD-10-CM

## 2017-04-24 DIAGNOSIS — F17200 Nicotine dependence, unspecified, uncomplicated: Secondary | ICD-10-CM

## 2017-04-24 DIAGNOSIS — G894 Chronic pain syndrome: Secondary | ICD-10-CM

## 2017-04-24 MED ORDER — CEPHALEXIN 500 MG PO CAPS: 500.0000 mg | ORAL_CAPSULE | Freq: Three times a day (TID) | ORAL | 0 refills | Status: DC

## 2017-04-24 NOTE — Patient Instructions (Signed)
Steps to Quit Smoking Smoking tobacco can be bad for your health. It can also affect almost every organ in your body. Smoking puts you and people around you at risk for many serious long-lasting (chronic) diseases. Quitting smoking is hard, but it is one of the best things that you can do for your health. It is never too late to quit. What are the benefits of quitting smoking? When you quit smoking, you lower your risk for getting serious diseases and conditions. They can include:  Lung cancer or lung disease.  Heart disease.  Stroke.  Heart attack.  Not being able to have children (infertility).  Weak bones (osteoporosis) and broken bones (fractures).  If you have coughing, wheezing, and shortness of breath, those symptoms may get better when you quit. You may also get sick less often. If you are pregnant, quitting smoking can help to lower your chances of having a baby of low birth weight. What can I do to help me quit smoking? Talk with your doctor about what can help you quit smoking. Some things you can do (strategies) include:  Quitting smoking totally, instead of slowly cutting back how much you smoke over a period of time.  Going to in-person counseling. You are more likely to quit if you go to many counseling sessions.  Using resources and support systems, such as: ? Online chats with a counselor. ? Phone quitlines. ? Printed self-help materials. ? Support groups or group counseling. ? Text messaging programs. ? Mobile phone apps or applications.  Taking medicines. Some of these medicines may have nicotine in them. If you are pregnant or breastfeeding, do not take any medicines to quit smoking unless your doctor says it is okay. Talk with your doctor about counseling or other things that can help you.  Talk with your doctor about using more than one strategy at the same time, such as taking medicines while you are also going to in-person counseling. This can help make  quitting easier. What things can I do to make it easier to quit? Quitting smoking might feel very hard at first, but there is a lot that you can do to make it easier. Take these steps:  Talk to your family and friends. Ask them to support and encourage you.  Call phone quitlines, reach out to support groups, or work with a counselor.  Ask people who smoke to not smoke around you.  Avoid places that make you want (trigger) to smoke, such as: ? Bars. ? Parties. ? Smoke-break areas at work.  Spend time with people who do not smoke.  Lower the stress in your life. Stress can make you want to smoke. Try these things to help your stress: ? Getting regular exercise. ? Deep-breathing exercises. ? Yoga. ? Meditating. ? Doing a body scan. To do this, close your eyes, focus on one area of your body at a time from head to toe, and notice which parts of your body are tense. Try to relax the muscles in those areas.  Download or buy apps on your mobile phone or tablet that can help you stick to your quit plan. There are many free apps, such as QuitGuide from the CDC (Centers for Disease Control and Prevention). You can find more support from smokefree.gov and other websites.  This information is not intended to replace advice given to you by your health care provider. Make sure you discuss any questions you have with your health care provider. Document Released: 08/06/2009 Document   Revised: 06/07/2016 Document Reviewed: 02/24/2015 Elsevier Interactive Patient Education  2018 Elsevier Inc.     Peripheral Vascular Disease Peripheral vascular disease (PVD) is a disease of the blood vessels that are not part of your heart and brain. A simple term for PVD is poor circulation. In most cases, PVD narrows the blood vessels that carry blood from your heart to the rest of your body. This can result in a decreased supply of blood to your arms, legs, and internal organs, like your stomach or kidneys.  However, it most often affects a person's lower legs and feet. There are two types of PVD.  Organic PVD. This is the more common type. It is caused by damage to the structure of blood vessels.  Functional PVD. This is caused by conditions that make blood vessels contract and tighten (spasm).  Without treatment, PVD tends to get worse over time. PVD can also lead to acute ischemic limb. This is when an arm or limb suddenly has trouble getting enough blood. This is a medical emergency. Follow these instructions at home:  Take medicines only as told by your doctor.  Do not use any tobacco products, including cigarettes, chewing tobacco, or electronic cigarettes. If you need help quitting, ask your doctor.  Lose weight if you are overweight, and maintain a healthy weight as told by your doctor.  Eat a diet that is low in fat and cholesterol. If you need help, ask your doctor.  Exercise regularly. Ask your doctor for some good activities for you.  Take good care of your feet. ? Wear comfortable shoes that fit well. ? Check your feet often for any cuts or sores. Contact a doctor if:  You have cramps in your legs while walking.  You have leg pain when you are at rest.  You have coldness in a leg or foot.  Your skin changes.  You are unable to get or have an erection (erectile dysfunction).  You have cuts or sores on your feet that are not healing. Get help right away if:  Your arm or leg turns cold and blue.  Your arms or legs become red, warm, swollen, painful, or numb.  You have chest pain or trouble breathing.  You suddenly have weakness in your face, arm, or leg.  You become very confused or you cannot speak.  You suddenly have a very bad headache.  You suddenly cannot see. This information is not intended to replace advice given to you by your health care provider. Make sure you discuss any questions you have with your health care provider. Document Released:  01/04/2010 Document Revised: 03/17/2016 Document Reviewed: 03/20/2014 Elsevier Interactive Patient Education  2017 Elsevier Inc.  

## 2017-04-24 NOTE — Telephone Encounter (Signed)
Can f/u with us in 2 months   Ashlee FerryJ Beronica Lansdale MD

## 2017-04-24 NOTE — Telephone Encounter (Signed)
-----   Message from Sharee PimpleMarilyn K McChesney, RN sent at 04/24/2017 10:47 AM EDT ----- Regarding: RE: phone call return  I can't call them right now, just offer them this afternoon with suzanne, she has openings. Say 1:45 ----- Message ----- From: Jena Gaussoczniak, Michele A Sent: 04/24/2017  10:37 AM To: Sharee PimpleMarilyn K McChesney, RN Subject: FW: phone call return                            ----- Message ----- From: Norval Gableavis, Ardenia Sent: 04/24/2017  10:02 AM To: [facility] Subject: phone call return                              Please call pt back. She has called four times and her husband within the hour. They state they are in pain. They are trying to see Dr. Myra GianottiBrabham while he is in office.   Thank you

## 2017-04-24 NOTE — Progress Notes (Signed)
Postoperative Visit   History of Present Illness  Ashlee Snow is a 61 y.o. year old female who is s/p aorto- bifemoral bypass with 14 x 7 dacryon graft on 04-14-17 by Dr. Myra GianottiBrabham for right leg ulcer. The patient has a history of right iliac stenting. Those occluded.  She had significant claudication symptoms and developed a wound on her right toe. Findings:  End to end the proximal anastomosis.  The distal anastomosis was to the bilateral distal common femoral arteries.  I did not reimplant the IMA as there was excellent Doppler signal after the case.  The patient had a retroaortic left renal vein.   She returns today with c/o swelling that she noticed this morning in her right groin, also tender to touch. She denies fever or chills The ulcer at the plantar aspect, base of her right great toe is healing since the above procedure according to pt and husband.   She is on a pain management contract with Dr. Lowell GuitarPowell, possibly for chronic back pain.   The patient notes resolution of lower extremity symptoms. The patient is able to complete their activities of daily living.    For VQI Use Only  PRE-ADM LIVING: Home  AMB STATUS: Ambulatory  Past Medical History:  Diagnosis Date  . Asthma   . Bronchitis   . DDD (degenerative disc disease)   . Depression   . Dyspnea    on exertion  . Fibromyalgia   . GERD (gastroesophageal reflux disease)    hx. of   . History of depression   . Hypertension   . Hypothyroidism   . Leg pain   . Migraine   . Peripheral arterial disease (HCC)   . Peripheral vascular disease (HCC)   . Sciatica   . Thyroid disease   . 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 Ulcer    Past Surgical History:  Procedure Laterality Date  . ABDOMINAL HYSTERECTOMY    . AORTA - BILATERAL FEMORAL ARTERY BYPASS GRAFT Bilateral 04/14/2017   Procedure: AORTA BIFEMORAL BYPASS GRAFT WITH HEMASHIELD GOLD BIFURCATED 14MMX7 MM GRAFT;   Surgeon: Nada LibmanBrabham, Vance W, MD;  Location: [facility] OR;  Service: Vascular;  Laterality: Bilateral;  . aortogram w/ stenting  2007, 2011, Aug. 2012  . BACK SURGERY  2016   lumbar surgery  . CHOLECYSTECTOMY  01/22/1997   Gall Bladder  . COLON SURGERY  1982   exploratory surgery  . COLONOSCOPY WITH ESOPHAGOGASTRODUODENOSCOPY (EGD)  2010   Dr. Jena Gaussourk: EGD with small hiatal hernia, small bowel biopsy negative, colonoscopy with hyperplastic polyp   . MANDIBLE FRACTURE SURGERY     TMJ surgery - right side  . OVARIAN CYST SURGERY    . TUBAL LIGATION      Social History   Social History  . Marital status: Married    Spouse name: N/A  . Number of children: N/A  . Years of education: N/A   Occupational History  . Not on file.   Social History Main Topics  . Smoking status: Current Every Day Smoker    Packs/day: 1.00    Types: Cigarettes, E-cigarettes    Start date: 08/03/1975  . Smokeless tobacco: Never Used     Comment: Vaper cigarettes "juice", 20mg .  . Alcohol use No  . Drug use: No  . Sexual activity: Not on file   Other Topics Concern  . Not on file   Social History Narrative  . No narrative  on file    Allergies  Allergen Reactions  . Ativan [Lorazepam]     Current Outpatient Prescriptions on File Prior to Visit  Medication Sig Dispense Refill  . BLACK COHOSH PO Take 3 capsules by mouth daily.     . ergocalciferol (VITAMIN D2) 50000 units capsule Take 50,000 Units by mouth once a week. Monday    . HM ASPIRIN 81 MG chewable tablet Chew 81 mg by mouth 2 (two) times daily.   1  . levothyroxine (SYNTHROID, LEVOTHROID) 75 MCG tablet Take 75 mcg by mouth daily before breakfast.    . lisinopril (PRINIVIL,ZESTRIL) 20 MG tablet Take 20 mg by mouth every morning.     Marland Kitchen LYRICA 150 MG capsule Take 150 mg by mouth 2 (two) times daily.  1  . methocarbamol (ROBAXIN) 750 MG tablet Take 750 mg by mouth 3 (three) times daily.     . metoprolol succinate (TOPROL-XL) 25 MG 24 hr tablet Take 25  mg by mouth every morning.    . nicotine (NICODERM CQ - DOSED IN MG/24 HOURS) 14 mg/24hr patch Place 1 patch (14 mg total) onto the skin daily. 21 patch 0  . oxyCODONE (ROXICODONE) 15 MG immediate release tablet Take 15 mg by mouth every 8 (eight) hours as needed for pain.    Marland Kitchen oxyCODONE-acetaminophen (PERCOCET) 10-325 MG tablet Take 1 tablet by mouth every 6 (six) hours as needed for pain. 30 tablet 0  . pravastatin (PRAVACHOL) 20 MG tablet Take 20 mg by mouth every morning.     . promethazine (PHENERGAN) 25 MG tablet Take 1 tablet by mouth every 6 (six) hours as needed.  2  . venlafaxine XR (EFFEXOR-XR) 75 MG 24 hr capsule Take 75 mg by mouth every morning.  1   No current facility-administered medications on file prior to visit.      Physical Examination  Vitals:   04/24/17 1345  BP: 119/74  Pulse: 83  Resp: 16  Temp: 99.4 F (37.4 C)  SpO2: 97%  Weight: 144 lb (65.3 kg)  Height: 5\' 4"  (1.626 m)   Body mass index is 24.72 kg/m.  Right DP pulse is 1+ palpable. Right groin is tender to palpation, unable to palpate enough to feel right femoral pulse; left femoral pulse is 2+ palpable. Plantar aspect base of right great toe ulcer is healing.    Medical Decision Making  Ashlee Snow is a 61 y.o. year old female who presents s/p aorto- bifemoral bypass with 14 x 7 dacryon graft on 04-14-17.   The patient's bypass incisions are healing appropriately, except for mild swelling of both groins and mild erythema at both groin incision with no drainage, no purulence, with resolution of pre-operative symptoms.  Dr. Myra Gianotti spoke with pt and her husband and examined pt.  Mild amount of swelling in both groins is commensurate for 10 days after this surgery.   Mild erythema at bilateral groin incision edges with no drainage, no purulence: Keflex 500 mg, 1 capsule by mouth tid x 5 days, disp #15, 0 refills.   Follow up with Dr. Myra Gianotti as scheduled on 05-17-17.   I advised her and her  husband to notify us if she develops concerns re her incisions or the circulation in her legs.   The patient was counseled re smoking cessation and given several free resources re smoking cessation.m  I discussed in depth with the patient the nature of atherosclerosis, and emphasized the importance of maximal medical management including strict control  of blood pressure, blood glucose, and lipid levels, obtaining regular exercise, and cessation of smoking.  The patient is aware that without maximal medical management the underlying atherosclerotic disease process will progress, limiting the benefit of any interventions.   I emphasized the importance of routine surveillance of the patient's bypass, as the vascular surgery literature emphasize the improved patency possible with assisted primary patency procedures versus secondary patency procedures. The patient agrees to participate in their maximal medical care and routine surveillance.  Thank you for allowing [place] to participate in this patient's care.  NICKEL, Carma Lair, RN, MSN, FNP-C [facility] Office: [phone]  04/24/2017, 1:56 PM  Clinic MD: Myra Gianotti

## 2017-04-24 NOTE — Telephone Encounter (Signed)
Sched appt 04/24/17 at 1:15 w/ NP. Spoke to pt to confirm appt.

## 2017-04-24 NOTE — Telephone Encounter (Signed)
Patient notified.  Follow up scheduled for 07/04/2017 with Dr. Wyline MoodBranch.

## 2017-04-26 ENCOUNTER — Telehealth: Payer: Self-pay | Admitting: Vascular Surgery

## 2017-04-26 NOTE — Telephone Encounter (Signed)
Husband called because she had a fever to 100.6 Was just seen Monday. Everything looked good.  I told him that it was fine for her to take tylenol or ibuprofen. He will call back if her ferver gets worse.

## 2017-04-27 ENCOUNTER — Telehealth: Payer: Self-pay | Admitting: *Deleted

## 2017-04-27 NOTE — Telephone Encounter (Signed)
Returned call to patient.  Her husband had called yesterday, 04/27/2017 and left a message with the answering service that patient head a fever.  I called the cell # and the home # and received voice mail.  I left a voice mail on each phone # for patient or husband to call me back as soon as possible.

## 2017-04-27 NOTE — Telephone Encounter (Signed)
Husband called stating that temperature is now back to normal, 98.6, however, she is experiencing swelling at the bend of her knee and thigh.  Patient has not been elevating her legs at all.  I encouraged that the patient elevate both legs above the level of her heart, requiring possibly two pillows.  Mr Kennedy BuckerGrant voiced understanding of the instructions and I asked him to call tomorrow if the symptoms should worsen or if there is no improvement.

## 2017-05-03 ENCOUNTER — Telehealth: Payer: Self-pay | Admitting: Vascular Surgery

## 2017-05-03 ENCOUNTER — Encounter: Payer: Self-pay | Admitting: Surgery

## 2017-05-03 ENCOUNTER — Ambulatory Visit (INDEPENDENT_AMBULATORY_CARE_PROVIDER_SITE_OTHER): Payer: Self-pay | Admitting: Family

## 2017-05-03 ENCOUNTER — Encounter: Payer: Self-pay | Admitting: Family

## 2017-05-03 VITALS — BP 111/69 | HR 81 | Temp 97.4°F | Resp 20 | Ht 64.0 in | Wt 141.0 lb

## 2017-05-03 DIAGNOSIS — Z87891 Personal history of nicotine dependence: Secondary | ICD-10-CM

## 2017-05-03 DIAGNOSIS — S3012XD Contusion of groin, subsequent encounter: Secondary | ICD-10-CM

## 2017-05-03 DIAGNOSIS — S301XXD Contusion of abdominal wall, subsequent encounter: Secondary | ICD-10-CM

## 2017-05-03 DIAGNOSIS — I779 Disorder of arteries and arterioles, unspecified: Secondary | ICD-10-CM

## 2017-05-03 DIAGNOSIS — Z789 Other specified health status: Secondary | ICD-10-CM

## 2017-05-03 DIAGNOSIS — Z720 Tobacco use: Secondary | ICD-10-CM

## 2017-05-03 MED ORDER — CEPHALEXIN 500 MG PO CAPS: 500.0000 mg | ORAL_CAPSULE | Freq: Three times a day (TID) | ORAL | 0 refills | Status: DC

## 2017-05-03 NOTE — Progress Notes (Signed)
Postoperative Visit   History of Present Illness  Ashlee Snow is a 61 y.o. year old female who is s/p aorto- bifemoral bypass with14 x 7 dacryon graft on 04-14-17 by Dr. Myra Gianotti for right leg ulcer. The patient has a history of right iliac stenting. Those occluded. She had significant claudication symptoms and developed a wound on her right toe. Findings:End to end the proximal anastomosis. The distal anastomosis was to the bilateral distal common femoral arteries. I did not reimplant the IMA as there was excellent Doppler signal after the case. The patient had a retroaortic left renal vein.   She returned on 04-24-17 with c/o swelling that she noticed that morning in her right groin, also tender to touch. She denied fever or chills The ulcer at the plantar aspect, base of her right great toe was healing since the above procedure according to pt and husband.  I prescribed Keflex, 5 day course. She has a follow up appointment scheduled with Dr. Myra Gianotti on 05-17-17.   She returns today with c/o some "lumps near her incision" which she has had for a couple of weeks. She states that there are no problems with the incision itself.   She is on a pain management contract with Dr. Lowell Guitar, possibly for chronic back pain. She reports that she takes 15 mg oxycodone, 3 times/day, prescribed by  Dr. Gerilyn Pilgrim.   She is using nicotine vapor product, states she has not smoked a cigarette in 3 years.   The patient notes resolution of lower extremity symptoms. The patient is able to complete their activities of daily living.    For VQI Use Only  PRE-ADM LIVING: Home  AMB STATUS: Ambulatory    Past Medical History:  Diagnosis Date  . Asthma   . Bronchitis   . DDD (degenerative disc disease)   . Depression   . Dyspnea    on exertion  . Fibromyalgia   . GERD (gastroesophageal reflux disease)    hx. of   . History of depression   . Hypertension   . Hypothyroidism   . Leg pain    . Migraine   . Peripheral arterial disease (HCC)   . Peripheral vascular disease (HCC)   . Sciatica   . Thyroid disease   . 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 Ulcer     Past Surgical History:  Procedure Laterality Date  . ABDOMINAL HYSTERECTOMY    . AORTA - BILATERAL FEMORAL ARTERY BYPASS GRAFT Bilateral 04/14/2017   Procedure: AORTA BIFEMORAL BYPASS GRAFT WITH HEMASHIELD GOLD BIFURCATED 14MMX7 MM GRAFT;  Surgeon: Nada Libman, MD;  Location: [facility] OR;  Service: Vascular;  Laterality: Bilateral;  . aortogram w/ stenting  2007, 2011, Aug. 2012  . BACK SURGERY  2016   lumbar surgery  . CHOLECYSTECTOMY  01/22/1997   Gall Bladder  . COLON SURGERY  1982   exploratory surgery  . COLONOSCOPY WITH ESOPHAGOGASTRODUODENOSCOPY (EGD)  2010   Dr. Jena Gauss: EGD with small hiatal hernia, small bowel biopsy negative, colonoscopy with hyperplastic polyp   . MANDIBLE FRACTURE SURGERY     TMJ surgery - right side  . OVARIAN CYST SURGERY    . TUBAL LIGATION      Social History   Social History  . Marital status: Married    Spouse name: N/A  . Number of children: N/A  . Years of education: N/A   Occupational History  . Not on file.  Social History Main Topics  . Smoking status: Current Every Day Smoker    Packs/day: 1.00    Types: E-cigarettes    Start date: 08/03/1975  . Smokeless tobacco: Never Used     Comment: Vaper cigarettes "juice", 20mg .  . Alcohol use No  . Drug use: No  . Sexual activity: Not on file   Other Topics Concern  . Not on file   Social History Narrative  . No narrative on file    Allergies  Allergen Reactions  . Ativan [Lorazepam]     Current Outpatient Prescriptions on File Prior to Visit  Medication Sig Dispense Refill  . BLACK COHOSH PO Take 3 capsules by mouth daily.     . ergocalciferol (VITAMIN D2) 50000 units capsule Take 50,000 Units by mouth once a week. Monday    . HM ASPIRIN 81 MG chewable  tablet Chew 81 mg by mouth 2 (two) times daily.   1  . levothyroxine (SYNTHROID, LEVOTHROID) 75 MCG tablet Take 75 mcg by mouth daily before breakfast.    . lisinopril (PRINIVIL,ZESTRIL) 20 MG tablet Take 20 mg by mouth every morning.     Marland Kitchen LYRICA 150 MG capsule Take 150 mg by mouth 2 (two) times daily.  1  . methocarbamol (ROBAXIN) 750 MG tablet Take 750 mg by mouth 3 (three) times daily.     Marland Kitchen oxyCODONE (ROXICODONE) 15 MG immediate release tablet Take 15 mg by mouth every 8 (eight) hours as needed for pain.    Marland Kitchen oxyCODONE-acetaminophen (PERCOCET) 10-325 MG tablet Take 1 tablet by mouth every 6 (six) hours as needed for pain. 30 tablet 0  . pravastatin (PRAVACHOL) 20 MG tablet Take 20 mg by mouth every morning.     . promethazine (PHENERGAN) 25 MG tablet Take 1 tablet by mouth every 6 (six) hours as needed.  2  . venlafaxine XR (EFFEXOR-XR) 75 MG 24 hr capsule Take 75 mg by mouth every morning.  1  . cephALEXin (KEFLEX) 500 MG capsule Take 1 capsule (500 mg total) by mouth 3 (three) times daily. (Patient not taking: Reported on 05/03/2017) 15 capsule 0  . metoprolol succinate (TOPROL-XL) 25 MG 24 hr tablet Take 25 mg by mouth every morning.    . mupirocin ointment (BACTROBAN) 2 % APPLY TO EACH NOSTRIL TWICE DAILY FOR FIVE DAYS  0  . nicotine (NICODERM CQ - DOSED IN MG/24 HOURS) 14 mg/24hr patch Place 1 patch (14 mg total) onto the skin daily. (Patient not taking: Reported on 05/03/2017) 21 patch 0  . sulfamethoxazole-trimethoprim (BACTRIM DS,SEPTRA DS) 800-160 MG tablet Take 1 tablet by mouth 2 (two) times daily.  0   No current facility-administered medications on file prior to visit.      Physical Examination  Vitals:   05/03/17 1028  BP: 111/69  Pulse: 81  Resp: 20  Temp: (!) 97.4 F (36.3 C)  TempSrc: Oral  SpO2: 97%  Weight: 141 lb (64 kg)  Height: 5\' 4"  (1.626 m)   Body mass index is 24.2 kg/m.   Right DP pulse is 1+ palpable.  Bilateral DP and PT pulses have brisk Doppler  signals.  Right groin is tender to palpation, unable to palpate enough to feel right femoral pulse; left femoral pulse is 2+ palpable. Plantar aspect base of right great toe ulcer has mostly resolved.  Hematoma at lateral aspect right groin incision, no drainage from incisions.  Abdominal and right groin incision edges are well proximated. Left groin incision is well proximated  except for small area of shallow separation, minimal erythema.   Medical Decision Making  Ashlee Snow is a 61 y.o. year old female who presents s/p s/p aorto- bifemoral bypass with14 x 7 dacryon graft on 04-14-17.  The patient's bypass incisions are healing appropriately, except for mild swelling of both groins and mild erythema at both groin incision with no drainage, no purulence, with resolution of pre-operative symptoms.  Hematoma at lateral/proximal end of right groin incision has not changed from her last visit. Abdominal and right groin incision edges are well proximated. Left groin incision is well proximated except for small area of shallow separation, minimal erythema. But since she has been having mild chills alternating with feeling hot for a few days, will reorder Keflex for 10 days, she finished the 5 day course of Keflex that I prescribed at her 04-24-17 visit. .   Gradually increase walking. Shower daily with liquid antibacterial soap. Keep groins clean and dry.  Follow up with Dr. Myra GianottiBrabham as scheduled on 05-17-17. I advised pt and husband to notify us if concerns develop re her incisions or circulation in her feet or legs.     Adelai Achey, Carma LairSUZANNE L, RN, MSN, FNP-C [facility] Office: [phone]  05/03/2017, 10:38 AM  Clinic MD: Dickson/Chen

## 2017-05-03 NOTE — Telephone Encounter (Signed)
-----   Message from Sharee PimpleMarilyn K McChesney, RN sent at 05/03/2017  8:44 AM EDT ----- Regarding: FW: office visit w/ NP today or tomorrow per [facility]    ----- Message ----- From: Chuck Hintickson, Christopher S, MD Sent: 05/03/2017   6:50 AM To: [facility] Charge Pool Subject: office visit                                   I left a note on a telephone encounter with this patient. If that does not make it through to scheduling she needs to be seen for evaluation of her abdomen/incision possibly by Rosalita ChessmanSuzanne in the next day or 2. Thank you. CD

## 2017-05-03 NOTE — Patient Instructions (Addendum)

## 2017-05-03 NOTE — Telephone Encounter (Signed)
Sched appt 05/03/17 at 11:15. Spoke to husband.

## 2017-05-03 NOTE — Telephone Encounter (Signed)
This patient called last night. She had an aortofemoral bypass graft by Dr. Myra GianottiBrabham on 04/14/2017. She called complaining of some "lumps near her incision which she has had for a couple of weeks. She states that there are no problems with the incision itself. Then we please call her and get her in for an office visit to have someone check her incision. This might be a good patient for Rosalita ChessmanSuzanne. I did explain to her that if she was concerned enough and certainly she could go to the emergency department to have this evaluated. CD

## 2017-05-04 ENCOUNTER — Ambulatory Visit: Payer: BLUE CROSS/BLUE SHIELD | Admitting: Family

## 2017-05-08 ENCOUNTER — Telehealth: Payer: Self-pay | Admitting: *Deleted

## 2017-05-08 NOTE — Telephone Encounter (Signed)
Mr Ashlee Snow had called the office answering service over the weekend and Dr Randie Heinzain had returned his call.  Mr Ashlee Snow stated that he will call [facility] if the symptoms should worsen or Ashlee Snow developes a fever.

## 2017-05-17 ENCOUNTER — Encounter: Payer: Self-pay | Admitting: Surgery

## 2017-05-17 ENCOUNTER — Ambulatory Visit (INDEPENDENT_AMBULATORY_CARE_PROVIDER_SITE_OTHER): Payer: Self-pay | Admitting: Surgery

## 2017-05-17 VITALS — BP 140/81 | HR 76 | Temp 98.2°F | Resp 18 | Ht 64.0 in | Wt 141.0 lb

## 2017-05-17 DIAGNOSIS — I70238 Atherosclerosis of native arteries of right leg with ulceration of other part of lower right leg: Secondary | ICD-10-CM

## 2017-05-17 NOTE — Progress Notes (Signed)
Patient name: Ashlee ParaDebra A Ulbrich MRN: [id number] DOB: January 01, 1956 Sex: female  REASON FOR VISIT:     post op  HISTORY OF PRESENT ILLNESS:   Ashlee Snow is a 61 y.o. female who is status post aortobifemoral bypass graft using a 14 x 7 dacryon graft on 04/14/2017 for right leg ulcer.  She had a end to end proximal anastomosis.  She was seen on 711 4 right groin tenderness.  She was started on Keflex. Her biggest complaint is that of a bulge in the right groin.  She is walking without difficulty.  The wound on her right toe has nearly healed.  Her appetite is back to normal.  CURRENT MEDICATIONS:    Current Outpatient Prescriptions  Medication Sig Dispense Refill  . BLACK COHOSH PO Take 3 capsules by mouth daily.     . cephALEXin (KEFLEX) 500 MG capsule Take 1 capsule (500 mg total) by mouth 3 (three) times daily. 30 capsule 0  . ergocalciferol (VITAMIN D2) 50000 units capsule Take 50,000 Units by mouth once a week. Monday    . HM ASPIRIN 81 MG chewable tablet Chew 81 mg by mouth 2 (two) times daily.   1  . levothyroxine (SYNTHROID, LEVOTHROID) 75 MCG tablet Take 75 mcg by mouth daily before breakfast.    . lisinopril (PRINIVIL,ZESTRIL) 20 MG tablet Take 20 mg by mouth every morning.     Marland Kitchen. LYRICA 150 MG capsule Take 150 mg by mouth 2 (two) times daily.  1  . methocarbamol (ROBAXIN) 750 MG tablet Take 750 mg by mouth 3 (three) times daily.     . metoprolol succinate (TOPROL-XL) 25 MG 24 hr tablet Take 25 mg by mouth every morning.    . mupirocin ointment (BACTROBAN) 2 % APPLY TO EACH NOSTRIL TWICE DAILY FOR FIVE DAYS  0  . oxyCODONE (ROXICODONE) 15 MG immediate release tablet Take 15 mg by mouth every 8 (eight) hours as needed for pain.    . pravastatin (PRAVACHOL) 20 MG tablet Take 20 mg by mouth every morning.     . promethazine (PHENERGAN) 25 MG tablet Take 1 tablet by mouth every 6 (six) hours as needed.  2  . venlafaxine XR (EFFEXOR-XR) 75 MG 24 hr  capsule Take 75 mg by mouth every morning.  1  . oxyCODONE-acetaminophen (PERCOCET) 10-325 MG tablet Take 1 tablet by mouth every 6 (six) hours as needed for pain. (Patient not taking: Reported on 05/17/2017) 30 tablet 0  . sulfamethoxazole-trimethoprim (BACTRIM DS,SEPTRA DS) 800-160 MG tablet Take 1 tablet by mouth 2 (two) times daily.  0   No current facility-administered medications for this visit.     REVIEW OF SYSTEMS:   [X]  denotes positive finding, [ ]  denotes negative finding Cardiac  Comments:  Chest pain or chest pressure:    Shortness of breath upon exertion:    Short of breath when lying flat:    Irregular heart rhythm:    Constitutional    Fever or chills:      PHYSICAL EXAM:   Vitals:   05/17/17 1505  BP: 140/81  Pulse: 76  Resp: 18  Temp: 98.2 F (36.8 C)  TempSrc: Oral  SpO2: 95%  Weight: 141 lb (64 kg)  Height: 5\' 4"  (1.626 m)    GENERAL: The patient is a well-nourished female, in no acute distress. The vital signs are documented above. CARDIOVASCULAR: There is a regular rate and rhythm. PULMONARY: Non-labored respirations Palpable dorsalis pedis pulse bilaterally.  The midline incision  is well-healed without hernia.  The left groin incision has 2 pinpoint size openings that were cauterized with silver nitrate.  There is a golf ball size seroma at the apex of the right groin incision.  This is not pulsatile.  The wound at the base of the right great toe has essentially healed.  STUDIES:   None   MEDICAL ISSUES:   Making excellent progress, status post aortobifemoral bypass graft.  We are observing the right groin seroma with hopes that it will resolve on its own.  She will contact me if it starts to appear infected.  I have scheduled for follow-up in 2 months.  Durene CalWells Brabham, MD [facility] Tel [phone] Pager [phone]

## 2017-05-29 ENCOUNTER — Telehealth: Payer: Self-pay | Admitting: *Deleted

## 2017-05-29 NOTE — Telephone Encounter (Signed)
Patient called to report a "red area on her private that is underneath her C-section scar" x 2 days. No injury or trauma noted.  Patient is s/p aortobifemoral BPG on 04-14-17 by Dr. Myra GianottiBrabham. She says that her right groin seroma is still the same size as when she was seen on 05-17-17 but she now reports a small scab in the left groin incision. Patient has completed the Keflex that was Rx'd at that visit. She is afebrile. I tried to get her to come for a wound check with Rosalita ChessmanSuzanne, NP tomorrow but her husband can not bring her until Friday afternoon. I cautioned her that we do not want this graft to get infected so she needs to call us if her condition gets any worse between now and Friday. She voiced understanding and agreement of this plan.

## 2017-05-31 NOTE — Telephone Encounter (Signed)
Message from [place] at our Appt desk regarding appt with NP on Friday;  Cancellation note attached to Appt: Patient (pt is feeling better/will call back if needed/awt)

## 2017-06-02 ENCOUNTER — Ambulatory Visit: Payer: BLUE CROSS/BLUE SHIELD | Admitting: Family

## 2017-07-04 ENCOUNTER — Ambulatory Visit: Payer: BLUE CROSS/BLUE SHIELD | Admitting: Cardiology

## 2017-07-17 ENCOUNTER — Ambulatory Visit: Payer: BLUE CROSS/BLUE SHIELD | Admitting: Surgery

## 2017-08-21 ENCOUNTER — Encounter: Payer: Self-pay | Admitting: Surgery

## 2017-08-21 ENCOUNTER — Ambulatory Visit (INDEPENDENT_AMBULATORY_CARE_PROVIDER_SITE_OTHER): Payer: BLUE CROSS/BLUE SHIELD | Admitting: Surgery

## 2017-08-21 VITALS — BP 124/78 | HR 98 | Temp 97.9°F | Resp 14 | Ht 64.0 in | Wt 137.0 lb

## 2017-08-21 DIAGNOSIS — I70238 Atherosclerosis of native arteries of right leg with ulceration of other part of lower right leg: Secondary | ICD-10-CM | POA: Diagnosis not present

## 2017-08-21 NOTE — Progress Notes (Signed)
Patient name: Ashlee Snow MRN: [id number] DOB: April 08, 1956 Sex: female    HPI: Ashlee Snow is a 61 y.o. female  who is status post aortobifemoral bypass graft using a 14 x 7 dacryon graft on 04/14/2017 for right leg ulcer.  She had a end to end proximal anastomosis.  She has had no change in her daily activities, she stays in the bed most of the day due to her depression.  Her right groin has improved and the swelling is gone.  She has picked the skin off her right great toe, but over all the ulcer has healed from pre-op.  Past Medical History:  Diagnosis Date  . Asthma   . Bronchitis   . DDD (degenerative disc disease)   . Depression   . Dyspnea    on exertion  . Fibromyalgia   . GERD (gastroesophageal reflux disease)    hx. of   . History of depression   . Hypertension   . Hypothyroidism   . Leg pain   . Migraine   . Peripheral arterial disease (HCC)   . Peripheral vascular disease (HCC)   . Sciatica   . Thyroid disease   . 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 Ulcer    Past Surgical History:  Procedure Laterality Date  . ABDOMINAL HYSTERECTOMY    . AORTA - BILATERAL FEMORAL ARTERY BYPASS GRAFT Bilateral 04/14/2017   Procedure: AORTA BIFEMORAL BYPASS GRAFT WITH HEMASHIELD GOLD BIFURCATED 14MMX7 MM GRAFT;  Surgeon: Nada Libman, MD;  Location: [facility] OR;  Service: Vascular;  Laterality: Bilateral;  . aortogram w/ stenting  2007, 2011, Aug. 2012  . BACK SURGERY  2016   lumbar surgery  . CHOLECYSTECTOMY  01/22/1997   Gall Bladder  . COLON SURGERY  1982   exploratory surgery  . COLONOSCOPY WITH ESOPHAGOGASTRODUODENOSCOPY (EGD)  2010   Dr. Jena Gauss: EGD with small hiatal hernia, small bowel biopsy negative, colonoscopy with hyperplastic polyp   . MANDIBLE FRACTURE SURGERY     TMJ surgery - right side  . OVARIAN CYST SURGERY    . TUBAL LIGATION      Family History  Problem Relation Age of Onset  . Heart disease Mother     . Cancer Mother        lung  . Arthritis Mother   . Diabetes Mother   . Stroke Mother   . Hypertension Mother   . Heart disease Father   . Arthritis Father   . Heart failure Father   . Hypertension Father   . Stroke Father   . Peripheral vascular disease Father   . Heart disease Sister   . Arthritis Sister   . Kidney disease Sister   . Stroke Sister   . Fibromyalgia Sister   . Colon cancer Neg Hx     SOCIAL HISTORY: Social History   Social History  . Marital status: Married    Spouse name: N/A  . Number of children: N/A  . Years of education: N/A   Occupational History  . Not on file.   Social History Main Topics  . Smoking status: Current Every Day Smoker    Packs/day: 1.00    Types: E-cigarettes    Start date: 08/03/1975  . Smokeless tobacco: Never Used     Comment: Vaper cigarettes "juice", 20mg .  . Alcohol use No  . Drug use: No  . Sexual activity: Not on file   Other  Topics Concern  . Not on file   Social History Narrative  . No narrative on file    Allergies  Allergen Reactions  . Ativan [Lorazepam]     Current Outpatient Prescriptions  Medication Sig Dispense Refill  . BLACK COHOSH PO Take 3 capsules by mouth daily.     . cephALEXin (KEFLEX) 500 MG capsule Take 1 capsule (500 mg total) by mouth 3 (three) times daily. 30 capsule 0  . ergocalciferol (VITAMIN D2) 50000 units capsule Take 50,000 Units by mouth once a week. Monday    . HM ASPIRIN 81 MG chewable tablet Chew 81 mg by mouth 2 (two) times daily.   1  . levothyroxine (SYNTHROID, LEVOTHROID) 75 MCG tablet Take 75 mcg by mouth daily before breakfast.    . LINZESS 145 MCG CAPS capsule Take 145 mcg by mouth daily.  0  . lisinopril (PRINIVIL,ZESTRIL) 20 MG tablet Take 20 mg by mouth every morning.     Marland Kitchen. LYRICA 150 MG capsule Take 150 mg by mouth 2 (two) times daily.  1  . methocarbamol (ROBAXIN) 750 MG tablet Take 750 mg by mouth 3 (three) times daily.     . metoprolol succinate (TOPROL-XL)  25 MG 24 hr tablet Take 25 mg by mouth every morning.    . mupirocin ointment (BACTROBAN) 2 % APPLY TO EACH NOSTRIL TWICE DAILY FOR FIVE DAYS  0  . oxyCODONE-acetaminophen (PERCOCET) 10-325 MG tablet Take 1 tablet by mouth every 6 (six) hours as needed for pain. 30 tablet 0  . pravastatin (PRAVACHOL) 20 MG tablet Take 20 mg by mouth every morning.     . promethazine (PHENERGAN) 25 MG tablet Take 1 tablet by mouth every 6 (six) hours as needed.  2  . sulfamethoxazole-trimethoprim (BACTRIM DS,SEPTRA DS) 800-160 MG tablet Take 1 tablet by mouth 2 (two) times daily.  0  . venlafaxine XR (EFFEXOR-XR) 75 MG 24 hr capsule Take 75 mg by mouth every morning.  1  . oxyCODONE (ROXICODONE) 15 MG immediate release tablet Take 15 mg by mouth every 8 (eight) hours as needed for pain.     No current facility-administered medications for this visit.     ROS:   General:  No weight loss, Fever, chills  HEENT: No recent headaches, no nasal bleeding, no visual changes, no sore throat  Neurologic: No dizziness, blackouts, seizures. No recent symptoms of stroke or mini- stroke. No recent episodes of slurred speech, or temporary blindness.  Cardiac: No recent episodes of chest pain/pressure, no shortness of breath at rest.  No shortness of breath with exertion.  Denies history of atrial fibrillation or irregular heartbeat  Vascular: No history of rest pain in feet.  No history of claudication.  No history of non-healing ulcer, No history of DVT   Pulmonary: No home oxygen, no productive cough, no hemoptysis,  No asthma or wheezing  Musculoskeletal:  [ ]  Arthritis, [x]  Low back pain,  [ ]  Joint pain  Hematologic:No history of hypercoagulable state.  No history of easy bleeding.  No history of anemia  Gastrointestinal: No hematochezia or melena,  No gastroesophageal reflux, no trouble swallowing  Urinary: [ ]  chronic Kidney disease, [ ]  on HD - [ ]  MWF or [ ]  TTHS, [ ]  Burning with urination, [ ]  Frequent  urination, [ ]  Difficulty urinating;   Skin: No rashes  Psychological: No history of anxiety,  positive history of depression   Physical Examination  Vitals:   08/21/17 1528  BP: 124/78  Pulse: 98  Resp: 14  Temp: 97.9 F (36.6 C)  SpO2: 98%  Weight: 137 lb (62.1 kg)  Height: 5\' 4"  (1.626 m)    Body mass index is 23.52 kg/m.  General:  Alert and oriented, no acute distress HEENT: Normal Neck: No bruit or JVD Pulmonary: Clear to auscultation bilaterally Cardiac: Regular Rate and Rhythm without murmur Abdomen: Soft, non-tender, non-distended, no mass, well healed abdominal and groin scars.  Tenderness right groin > left over incision.  No compressible mass, no visible hernia.   Skin: No rash, abrasion right great toe tip Extremity Pulses:  2+ radial, brachial, femoral, dorsalis pedis pulses bilaterally Musculoskeletal: No deformity or edema  Neurologic: Upper and lower extremity motor 5/5 and symmetric      ASSESSMENT:   PAD s/p aortobifemoral bypass graft   PLAN:   She finished the Keflex and the seroma has disapated.  She has tenderness to palpation over the right groin incision.  We will order a CTA of the Abd/pelvis to r/o hernia.  She will f/u in 3-4 weeks to review the study.  We have encouraged her to stop picking at her skin and allow thr right great toe to fully heal.    COLLINS, EMMA [facility] PA-C [facility]  The patient was seen in conjunction with Dr. Myra Gianotti   I agree with the above.  The patient has pain in her right groin that I cannot explain.  Admit to get a CT scan to rule out fluid collection or hernia.  She will follow up after her study  Durene Cal

## 2017-08-23 ENCOUNTER — Other Ambulatory Visit: Payer: Self-pay

## 2017-08-23 DIAGNOSIS — R109 Unspecified abdominal pain: Secondary | ICD-10-CM

## 2017-08-30 ENCOUNTER — Ambulatory Visit (HOSPITAL_COMMUNITY)
Admission: RE | Admit: 2017-08-30 | Discharge: 2017-08-30 | Disposition: A | Discharge status: Home / Self Care | Payer: BLUE CROSS/BLUE SHIELD | Source: Ambulatory Visit | Attending: Surgery | Admitting: Surgery

## 2017-08-30 DIAGNOSIS — N2889 Other specified disorders of kidney and ureter: Secondary | ICD-10-CM | POA: Insufficient documentation

## 2017-08-30 DIAGNOSIS — I701 Atherosclerosis of renal artery: Secondary | ICD-10-CM | POA: Insufficient documentation

## 2017-08-30 DIAGNOSIS — R935 Abnormal findings on diagnostic imaging of other abdominal regions, including retroperitoneum: Secondary | ICD-10-CM | POA: Insufficient documentation

## 2017-08-30 DIAGNOSIS — Z951 Presence of aortocoronary bypass graft: Secondary | ICD-10-CM | POA: Insufficient documentation

## 2017-08-30 DIAGNOSIS — Z9889 Other specified postprocedural states: Secondary | ICD-10-CM | POA: Insufficient documentation

## 2017-08-30 DIAGNOSIS — R109 Unspecified abdominal pain: Secondary | ICD-10-CM

## 2017-08-30 DIAGNOSIS — I774 Celiac artery compression syndrome: Secondary | ICD-10-CM | POA: Insufficient documentation

## 2017-08-30 MED ORDER — IOPAMIDOL (ISOVUE-370) INJECTION 76%
100.0000 mL | Freq: Once | INTRAVENOUS | Status: AC | PRN
  Administered 2017-08-30: 100 mL via INTRAVENOUS

## 2017-09-11 ENCOUNTER — Ambulatory Visit (INDEPENDENT_AMBULATORY_CARE_PROVIDER_SITE_OTHER): Payer: BLUE CROSS/BLUE SHIELD | Admitting: Surgery

## 2017-09-11 ENCOUNTER — Encounter: Payer: Self-pay | Admitting: Surgery

## 2017-09-11 VITALS — BP 110/78 | HR 70 | Temp 97.0°F | Resp 16 | Ht 64.0 in | Wt 141.0 lb

## 2017-09-11 DIAGNOSIS — I779 Disorder of arteries and arterioles, unspecified: Secondary | ICD-10-CM | POA: Diagnosis not present

## 2017-09-11 DIAGNOSIS — I70213 Atherosclerosis of native arteries of extremities with intermittent claudication, bilateral legs: Secondary | ICD-10-CM

## 2017-09-11 NOTE — Progress Notes (Signed)
HISTORY AND PHYSICAL     CC:  Problems with both legs Requesting Provider:  Kirstie PeriShah, Ashish, MD  HPI: This is a 61 y.o. female who is s/p aortobifemoral bypass grafting on 04/14/17.  At her last visit, her seroma had resolved with Keflex, but she continued to have tenderness over the right groin incision.  She was scheduled to have a CT scan and return in 3-4 weeks.  She states that [facility] for redness and swelling of her right foot.  She was given an abx to treat her foot, UTI and her head lesions.  She states that her foot has improved but still hurts.  She states that her big toe hurts.  She says that she stepped on a needle in 1979 and this is where her foot is hurting.  She tried to cut her toe nails, but she has no feeling and cut too deep, but she says this is healing.  She continues to have some burning sensations over her upper medial thighs and tenderness over both groin incisions.    She states that her dose of her depression medication has been increased.    Past Medical History:  Diagnosis Date  . Asthma   . Bronchitis   . DDD (degenerative disc disease)   . Depression   . Dyspnea    on exertion  . Fibromyalgia   . GERD (gastroesophageal reflux disease)    hx. of   . History of depression   . Hypertension   . Hypothyroidism   . Leg pain   . Migraine   . Peripheral arterial disease (HCC)   . Peripheral vascular disease (HCC)   . Sciatica   . Thyroid disease   . 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 Ulcer     Past Surgical History:  Procedure Laterality Date  . ABDOMINAL HYSTERECTOMY    . AORTA BIFEMORAL BYPASS GRAFT WITH HEMASHIELD GOLD BIFURCATED 14MMX7 MM GRAFT Bilateral 04/14/2017   Performed by Nada LibmanBrabham, Vance W[facility]  . aortogram w/ stenting  2007, 2011, Aug. 2012  . BACK SURGERY  2016   lumbar surgery  . CHOLECYSTECTOMY  01/22/1997   Gall Bladder  . COLON SURGERY   1982   exploratory surgery  . COLONOSCOPY WITH ESOPHAGOGASTRODUODENOSCOPY (EGD)  2010   Dr. Jena Gaussourk: EGD with small hiatal hernia, small bowel biopsy negative, colonoscopy with hyperplastic polyp   . MANDIBLE FRACTURE SURGERY     TMJ surgery - right side  . OVARIAN CYST SURGERY    . TUBAL LIGATION      Allergies  Allergen Reactions  . Ativan [Lorazepam]     Current Outpatient Medications  Medication Sig Dispense Refill  . BLACK COHOSH PO Take 3 capsules by mouth daily.     . ergocalciferol (VITAMIN D2) 50000 units capsule Take 50,000 Units by mouth once a week. Monday    . HM ASPIRIN 81 MG chewable tablet Chew 81 mg by mouth 2 (two) times daily.   1  . levothyroxine (SYNTHROID, LEVOTHROID) 75 MCG tablet Take 75 mcg by mouth daily before breakfast.    . lisinopril (PRINIVIL,ZESTRIL) 20 MG tablet Take 20 mg by mouth every morning.     Marland Kitchen. LYRICA 150 MG capsule Take 150 mg by mouth 2 (two) times daily.  1  . methocarbamol (ROBAXIN) 750 MG tablet Take 750 mg by mouth 3 (three) times daily.     .Marland Kitchen  metoprolol succinate (TOPROL-XL) 25 MG 24 hr tablet Take 25 mg by mouth every morning.    Marland Kitchen oxyCODONE-acetaminophen (PERCOCET) 10-325 MG tablet Take 1 tablet by mouth every 6 (six) hours as needed for pain. 30 tablet 0  . pravastatin (PRAVACHOL) 20 MG tablet Take 20 mg by mouth every morning.     . venlafaxine XR (EFFEXOR-XR) 75 MG 24 hr capsule Take 150 mg every morning by mouth.   1  . cephALEXin (KEFLEX) 500 MG capsule Take 1 capsule (500 mg total) by mouth 3 (three) times daily. 30 capsule 0  . LINZESS 145 MCG CAPS capsule Take 145 mcg by mouth daily.  0  . mupirocin ointment (BACTROBAN) 2 % APPLY TO EACH NOSTRIL TWICE DAILY FOR FIVE DAYS  0  . oxyCODONE (ROXICODONE) 15 MG immediate release tablet Take 15 mg by mouth every 8 (eight) hours as needed for pain.    . promethazine (PHENERGAN) 25 MG tablet Take 1 tablet by mouth every 6 (six) hours as needed.  2  . sulfamethoxazole-trimethoprim  (BACTRIM DS,SEPTRA DS) 800-160 MG tablet Take 1 tablet by mouth 2 (two) times daily.  0   No current facility-administered medications for this visit.     Family History  Problem Relation Age of Onset  . Heart disease Mother   . Cancer Mother        lung  . Arthritis Mother   . Diabetes Mother   . Stroke Mother   . Hypertension Mother   . Heart disease Father   . Arthritis Father   . Heart failure Father   . Hypertension Father   . Stroke Father   . Peripheral vascular disease Father   . Heart disease Sister   . Arthritis Sister   . Kidney disease Sister   . Stroke Sister   . Fibromyalgia Sister   . Colon cancer Neg Hx     Social History   Socioeconomic History  . Marital status: Married    Spouse name: Not on file  . Number of children: Not on file  . Years of education: Not on file  . Highest education level: Not on file  Social Needs  . Financial resource strain: Not on file  . Food insecurity - worry: Not on file  . Food insecurity - inability: Not on file  . Transportation needs - medical: Not on file  . Transportation needs - non-medical: Not on file  Occupational History  . Not on file  Tobacco Use  . Smoking status: Current Every Day Smoker    Packs/day: 1.00    Types: E-cigarettes    Start date: 08/03/1975  . Smokeless tobacco: Never Used  . Tobacco comment: Vaper cigarettes "juice", 20mg .  Substance and Sexual Activity  . Alcohol use: No  . Drug use: No  . Sexual activity: Not on file  Other Topics Concern  . Not on file  Social History Narrative  . Not on file     REVIEW OF SYSTEMS:   [X]  denotes positive finding, [ ]  denotes negative finding Cardiac  Comments:  Chest pain or chest pressure:    Shortness of breath upon exertion:    Short of breath when lying flat:    Irregular heart rhythm:        Vascular    Pain in calf, thigh, or hip brought on by ambulation: x   Pain in feet at night that wakes you up from your sleep:     Blood  clot in  your veins:    Leg swelling:         Pulmonary    Oxygen at home:    Productive cough:     Wheezing:         Neurologic    Sudden weakness in arms or legs:  x   Sudden numbness in arms or legs:     Sudden onset of difficulty speaking or slurred speech:    Temporary loss of vision in one eye:     Problems with dizziness:         Gastrointestinal    Blood in stool:     Vomited blood:         Genitourinary    Burning when urinating:     Blood in urine:        Psychiatric    Major depression:  x       Hematologic    Bleeding problems:    Problems with blood clotting too easily:        Skin    Rashes or ulcers:        Constitutional    Fever or chills:      PHYSICAL EXAMINATION:  Vitals:   09/11/17 1538  BP: 110/78  Pulse: 70  Resp: 16  Temp: (!) 97 F (36.1 C)  SpO2: 96%   Vitals:   09/11/17 1538  Weight: 141 lb (64 kg)  Height: 5\' 4"  (1.626 m)   Body mass index is 24.2 kg/m.  General:  WDWN in NAD; vital signs documented above Gait: Normal HENT: WNL, normocephalic Pulmonary: normal non-labored breathing Cardiac: regular HR Abdomen: soft, NT, no masses with healing laparotomy incision. Skin: excoriations on legs and abdomen as well as scalp. Vascular Exam/Pulses:  Right Left  DP 2+ (normal) 2+ (normal)  PT Unable to palpate  Unable to palpate    Extremities:      Musculoskeletal: no muscle wasting or atrophy  Neurologic: A&O X 3;  No focal weakness or paresthesias are detected Psychiatric:  The pt has Normal affect.   Non-Invasive Vascular Imaging:   CTA 08/30/17: IMPRESSION: Interval surgical changes of aortobifemoral bypass. Unremarkable appearance of the bilateral femoral anastomosis, with typical postsurgical changes of the inguinal region. No evidence of focal fluid collection, aneurysm/pseudoaneurysm, or stenosis.  New mild right pelvicaliectasis with dilation of the proximal ureter. This is likely secondary to the  course of the right ureter between the stented native iliac artery and the right iliac limb.  Re- demonstration of celiac artery stenosis, potentially a combination of atherosclerotic changes and compression of the diaphragmatic crus.  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  Pt meds includes: Statin:  Yes. Beta Blocker:  Yes.   Aspirin:  Yes.   ACEI:  Yes.   ARB:  No. CCB use:  No Other Antiplatelet/Anticoagulant:  No   ASSESSMENT/PLAN:: 61 y.o. female who is s/p aortobifemoral bypass grafting on 04/14/17 by Dr. Myra Gianotti who returns today after CT scan on 08/30/17.   -Pt continues to have pain over both groins and some burning and numbness on the medial upper thighs. She went to [facility] a couple of weeks ago for some redness and swelling of her right foot and was placed on abx for her foot, UTI and her scalp.  She states she thinks this has helped.   -she does have scaling on the bottom of her feet that she has been picking at-advised pt to  keep her feet moisturized and not to pick at her feet as this could cause her wound problems  -her feet remained cold-most likely due to lack of blood flow and damage to nerves prior to repair.  She may recover some of this, but most likely will remain with neuropathy.   -given her neuropathy, advised pt that she should seek out podiatrist for toenail trims to avoid future wound issues.  -f/u in 6 months with aorto-iliac duplex -continue statin/aspirin.   Doreatha MassedSamantha Rhyne, PA-C [facility] [phone]  Clinic MD:  Pt seen and examined with Dr. Myra GianottiBrabham  I agree with the above.  I have seen and evaluated patient.  She continues to have painful episodes in both groins.  Her CT scan was unremarkable for an etiology.  I suspect this is neuropathic in origin.  It is most likely related to scar tissue from her incision.  Hopefully this will  soften up and resolve over time.  She will come back to see me again in 6 months with an aortoiliac duplex  Durene CalWells Brabham

## 2017-09-12 NOTE — Addendum Note (Signed)
Addended by: Burton ApleyPETTY, Winda Summerall A on: 09/12/2017 10:12 AM   Modules accepted: Orders

## 2017-09-30 ENCOUNTER — Inpatient Hospital Stay (HOSPITAL_COMMUNITY)
Admission: AD | Admit: 2017-09-30 | Discharge: 2017-10-25 | DRG: 853 | Disposition: A | Discharge status: Home / Self Care | Payer: BLUE CROSS/BLUE SHIELD | Source: Other Acute Inpatient Hospital | Attending: Pulmonary Disease | Admitting: Pulmonary Disease

## 2017-09-30 ENCOUNTER — Encounter (HOSPITAL_COMMUNITY): Payer: Self-pay | Admitting: Family Medicine

## 2017-09-30 ENCOUNTER — Encounter (HOSPITAL_COMMUNITY): Admission: AD | Disposition: E | Payer: Self-pay | Source: Other Acute Inpatient Hospital | Attending: Pulmonary Disease

## 2017-09-30 ENCOUNTER — Inpatient Hospital Stay (HOSPITAL_COMMUNITY): Payer: BLUE CROSS/BLUE SHIELD | Admitting: Anesthesiology

## 2017-09-30 ENCOUNTER — Inpatient Hospital Stay (HOSPITAL_COMMUNITY): Payer: BLUE CROSS/BLUE SHIELD

## 2017-09-30 DIAGNOSIS — I739 Peripheral vascular disease, unspecified: Secondary | ICD-10-CM | POA: Diagnosis present

## 2017-09-30 DIAGNOSIS — F329 Major depressive disorder, single episode, unspecified: Secondary | ICD-10-CM | POA: Diagnosis present

## 2017-09-30 DIAGNOSIS — G9340 Encephalopathy, unspecified: Secondary | ICD-10-CM | POA: Diagnosis not present

## 2017-09-30 DIAGNOSIS — J9811 Atelectasis: Secondary | ICD-10-CM | POA: Diagnosis not present

## 2017-09-30 DIAGNOSIS — R0609 Other forms of dyspnea: Secondary | ICD-10-CM | POA: Diagnosis not present

## 2017-09-30 DIAGNOSIS — I82431 Acute embolism and thrombosis of right popliteal vein: Secondary | ICD-10-CM | POA: Diagnosis not present

## 2017-09-30 DIAGNOSIS — E46 Unspecified protein-calorie malnutrition: Secondary | ICD-10-CM | POA: Diagnosis present

## 2017-09-30 DIAGNOSIS — G9341 Metabolic encephalopathy: Secondary | ICD-10-CM | POA: Diagnosis present

## 2017-09-30 DIAGNOSIS — J9690 Respiratory failure, unspecified, unspecified whether with hypoxia or hypercapnia: Secondary | ICD-10-CM

## 2017-09-30 DIAGNOSIS — A4101 Sepsis due to Methicillin susceptible Staphylococcus aureus: Secondary | ICD-10-CM

## 2017-09-30 DIAGNOSIS — I5032 Chronic diastolic (congestive) heart failure: Secondary | ICD-10-CM | POA: Diagnosis present

## 2017-09-30 DIAGNOSIS — E870 Hyperosmolality and hypernatremia: Secondary | ICD-10-CM | POA: Diagnosis not present

## 2017-09-30 DIAGNOSIS — J690 Pneumonitis due to inhalation of food and vomit: Secondary | ICD-10-CM | POA: Diagnosis not present

## 2017-09-30 DIAGNOSIS — N179 Acute kidney failure, unspecified: Secondary | ICD-10-CM | POA: Diagnosis not present

## 2017-09-30 DIAGNOSIS — Z7982 Long term (current) use of aspirin: Secondary | ICD-10-CM

## 2017-09-30 DIAGNOSIS — F17290 Nicotine dependence, other tobacco product, uncomplicated: Secondary | ICD-10-CM | POA: Diagnosis present

## 2017-09-30 DIAGNOSIS — I340 Nonrheumatic mitral (valve) insufficiency: Secondary | ICD-10-CM | POA: Diagnosis not present

## 2017-09-30 DIAGNOSIS — I82621 Acute embolism and thrombosis of deep veins of right upper extremity: Secondary | ICD-10-CM | POA: Diagnosis present

## 2017-09-30 DIAGNOSIS — B9561 Methicillin susceptible Staphylococcus aureus infection as the cause of diseases classified elsewhere: Secondary | ICD-10-CM

## 2017-09-30 DIAGNOSIS — A4102 Sepsis due to Methicillin resistant Staphylococcus aureus: Secondary | ICD-10-CM | POA: Diagnosis not present

## 2017-09-30 DIAGNOSIS — Y95 Nosocomial condition: Secondary | ICD-10-CM | POA: Diagnosis not present

## 2017-09-30 DIAGNOSIS — R414 Neurologic neglect syndrome: Secondary | ICD-10-CM | POA: Diagnosis not present

## 2017-09-30 DIAGNOSIS — E878 Other disorders of electrolyte and fluid balance, not elsewhere classified: Secondary | ICD-10-CM | POA: Diagnosis not present

## 2017-09-30 DIAGNOSIS — I82409 Acute embolism and thrombosis of unspecified deep veins of unspecified lower extremity: Secondary | ICD-10-CM | POA: Diagnosis not present

## 2017-09-30 DIAGNOSIS — E781 Pure hyperglyceridemia: Secondary | ICD-10-CM | POA: Diagnosis not present

## 2017-09-30 DIAGNOSIS — Z419 Encounter for procedure for purposes other than remedying health state, unspecified: Secondary | ICD-10-CM

## 2017-09-30 DIAGNOSIS — B9562 Methicillin resistant Staphylococcus aureus infection as the cause of diseases classified elsewhere: Secondary | ICD-10-CM | POA: Diagnosis present

## 2017-09-30 DIAGNOSIS — I639 Cerebral infarction, unspecified: Secondary | ICD-10-CM

## 2017-09-30 DIAGNOSIS — N131 Hydronephrosis with ureteral stricture, not elsewhere classified: Secondary | ICD-10-CM | POA: Diagnosis present

## 2017-09-30 DIAGNOSIS — Z95828 Presence of other vascular implants and grafts: Secondary | ICD-10-CM

## 2017-09-30 DIAGNOSIS — R0600 Dyspnea, unspecified: Secondary | ICD-10-CM

## 2017-09-30 DIAGNOSIS — J45909 Unspecified asthma, uncomplicated: Secondary | ICD-10-CM | POA: Diagnosis present

## 2017-09-30 DIAGNOSIS — N10 Acute tubulo-interstitial nephritis: Secondary | ICD-10-CM | POA: Diagnosis not present

## 2017-09-30 DIAGNOSIS — Z66 Do not resuscitate: Secondary | ICD-10-CM | POA: Diagnosis present

## 2017-09-30 DIAGNOSIS — Z90710 Acquired absence of both cervix and uterus: Secondary | ICD-10-CM

## 2017-09-30 DIAGNOSIS — Z888 Allergy status to other drugs, medicaments and biological substances status: Secondary | ICD-10-CM | POA: Diagnosis not present

## 2017-09-30 DIAGNOSIS — I63541 Cerebral infarction due to unspecified occlusion or stenosis of right cerebellar artery: Secondary | ICD-10-CM | POA: Diagnosis not present

## 2017-09-30 DIAGNOSIS — Z955 Presence of coronary angioplasty implant and graft: Secondary | ICD-10-CM

## 2017-09-30 DIAGNOSIS — I824Z2 Acute embolism and thrombosis of unspecified deep veins of left distal lower extremity: Secondary | ICD-10-CM | POA: Diagnosis not present

## 2017-09-30 DIAGNOSIS — Z9911 Dependence on respirator [ventilator] status: Secondary | ICD-10-CM | POA: Diagnosis not present

## 2017-09-30 DIAGNOSIS — N111 Chronic obstructive pyelonephritis: Secondary | ICD-10-CM | POA: Diagnosis present

## 2017-09-30 DIAGNOSIS — Z6826 Body mass index (BMI) 26.0-26.9, adult: Secondary | ICD-10-CM

## 2017-09-30 DIAGNOSIS — R197 Diarrhea, unspecified: Secondary | ICD-10-CM | POA: Diagnosis not present

## 2017-09-30 DIAGNOSIS — I82491 Acute embolism and thrombosis of other specified deep vein of right lower extremity: Secondary | ICD-10-CM | POA: Diagnosis present

## 2017-09-30 DIAGNOSIS — I619 Nontraumatic intracerebral hemorrhage, unspecified: Secondary | ICD-10-CM | POA: Diagnosis not present

## 2017-09-30 DIAGNOSIS — E039 Hypothyroidism, unspecified: Secondary | ICD-10-CM | POA: Diagnosis present

## 2017-09-30 DIAGNOSIS — Z515 Encounter for palliative care: Secondary | ICD-10-CM | POA: Diagnosis not present

## 2017-09-30 DIAGNOSIS — R509 Fever, unspecified: Secondary | ICD-10-CM | POA: Diagnosis not present

## 2017-09-30 DIAGNOSIS — D696 Thrombocytopenia, unspecified: Secondary | ICD-10-CM | POA: Diagnosis present

## 2017-09-30 DIAGNOSIS — R0682 Tachypnea, not elsewhere classified: Secondary | ICD-10-CM

## 2017-09-30 DIAGNOSIS — Z789 Other specified health status: Secondary | ICD-10-CM

## 2017-09-30 DIAGNOSIS — I63411 Cerebral infarction due to embolism of right middle cerebral artery: Secondary | ICD-10-CM | POA: Diagnosis present

## 2017-09-30 DIAGNOSIS — I63111 Cerebral infarction due to embolism of right vertebral artery: Secondary | ICD-10-CM | POA: Diagnosis not present

## 2017-09-30 DIAGNOSIS — G936 Cerebral edema: Secondary | ICD-10-CM | POA: Diagnosis not present

## 2017-09-30 DIAGNOSIS — R0902 Hypoxemia: Secondary | ICD-10-CM

## 2017-09-30 DIAGNOSIS — Z79899 Other long term (current) drug therapy: Secondary | ICD-10-CM

## 2017-09-30 DIAGNOSIS — A419 Sepsis, unspecified organism: Secondary | ICD-10-CM

## 2017-09-30 DIAGNOSIS — I2510 Atherosclerotic heart disease of native coronary artery without angina pectoris: Secondary | ICD-10-CM | POA: Diagnosis present

## 2017-09-30 DIAGNOSIS — J9600 Acute respiratory failure, unspecified whether with hypoxia or hypercapnia: Secondary | ICD-10-CM

## 2017-09-30 DIAGNOSIS — Z823 Family history of stroke: Secondary | ICD-10-CM

## 2017-09-30 DIAGNOSIS — E785 Hyperlipidemia, unspecified: Secondary | ICD-10-CM | POA: Diagnosis present

## 2017-09-30 DIAGNOSIS — E872 Acidosis, unspecified: Secondary | ICD-10-CM | POA: Diagnosis present

## 2017-09-30 DIAGNOSIS — G8194 Hemiplegia, unspecified affecting left nondominant side: Secondary | ICD-10-CM | POA: Diagnosis not present

## 2017-09-30 DIAGNOSIS — R1310 Dysphagia, unspecified: Secondary | ICD-10-CM | POA: Diagnosis present

## 2017-09-30 DIAGNOSIS — I110 Hypertensive heart disease with heart failure: Secondary | ICD-10-CM | POA: Diagnosis present

## 2017-09-30 DIAGNOSIS — E861 Hypovolemia: Secondary | ICD-10-CM | POA: Diagnosis present

## 2017-09-30 DIAGNOSIS — R6521 Severe sepsis with septic shock: Secondary | ICD-10-CM | POA: Diagnosis present

## 2017-09-30 DIAGNOSIS — R319 Hematuria, unspecified: Secondary | ICD-10-CM | POA: Diagnosis not present

## 2017-09-30 DIAGNOSIS — I63431 Cerebral infarction due to embolism of right posterior cerebral artery: Secondary | ICD-10-CM | POA: Diagnosis not present

## 2017-09-30 DIAGNOSIS — E876 Hypokalemia: Secondary | ICD-10-CM | POA: Diagnosis present

## 2017-09-30 DIAGNOSIS — N390 Urinary tract infection, site not specified: Secondary | ICD-10-CM | POA: Diagnosis not present

## 2017-09-30 DIAGNOSIS — E860 Dehydration: Secondary | ICD-10-CM | POA: Diagnosis present

## 2017-09-30 DIAGNOSIS — R7881 Bacteremia: Secondary | ICD-10-CM | POA: Diagnosis not present

## 2017-09-30 DIAGNOSIS — Z9289 Personal history of other medical treatment: Secondary | ICD-10-CM

## 2017-09-30 DIAGNOSIS — G8929 Other chronic pain: Secondary | ICD-10-CM | POA: Diagnosis present

## 2017-09-30 DIAGNOSIS — T17908A Unspecified foreign body in respiratory tract, part unspecified causing other injury, initial encounter: Secondary | ICD-10-CM

## 2017-09-30 DIAGNOSIS — I6521 Occlusion and stenosis of right carotid artery: Secondary | ICD-10-CM | POA: Diagnosis not present

## 2017-09-30 DIAGNOSIS — R739 Hyperglycemia, unspecified: Secondary | ICD-10-CM | POA: Diagnosis present

## 2017-09-30 DIAGNOSIS — Z7189 Other specified counseling: Secondary | ICD-10-CM | POA: Diagnosis not present

## 2017-09-30 DIAGNOSIS — N136 Pyonephrosis: Secondary | ICD-10-CM | POA: Diagnosis present

## 2017-09-30 DIAGNOSIS — Z8249 Family history of ischemic heart disease and other diseases of the circulatory system: Secondary | ICD-10-CM

## 2017-09-30 DIAGNOSIS — M797 Fibromyalgia: Secondary | ICD-10-CM | POA: Diagnosis present

## 2017-09-30 DIAGNOSIS — H5702 Anisocoria: Secondary | ICD-10-CM | POA: Diagnosis not present

## 2017-09-30 DIAGNOSIS — J9601 Acute respiratory failure with hypoxia: Secondary | ICD-10-CM

## 2017-09-30 DIAGNOSIS — K219 Gastro-esophageal reflux disease without esophagitis: Secondary | ICD-10-CM | POA: Diagnosis present

## 2017-09-30 DIAGNOSIS — R6520 Severe sepsis without septic shock: Secondary | ICD-10-CM | POA: Diagnosis not present

## 2017-09-30 DIAGNOSIS — E875 Hyperkalemia: Secondary | ICD-10-CM | POA: Diagnosis not present

## 2017-09-30 DIAGNOSIS — T17908D Unspecified foreign body in respiratory tract, part unspecified causing other injury, subsequent encounter: Secondary | ICD-10-CM | POA: Diagnosis not present

## 2017-09-30 DIAGNOSIS — Z79890 Hormone replacement therapy (postmenopausal): Secondary | ICD-10-CM

## 2017-09-30 DIAGNOSIS — R0603 Acute respiratory distress: Secondary | ICD-10-CM

## 2017-09-30 DIAGNOSIS — D6489 Other specified anemias: Secondary | ICD-10-CM | POA: Diagnosis present

## 2017-09-30 DIAGNOSIS — I82401 Acute embolism and thrombosis of unspecified deep veins of right lower extremity: Secondary | ICD-10-CM | POA: Diagnosis not present

## 2017-09-30 HISTORY — DX: Cerebral infarction, unspecified: I63.9

## 2017-09-30 LAB — CBC WITH DIFFERENTIAL/PLATELET
BASOS PCT: 0 %
Basophils Absolute: 0 10*3/uL (ref 0.0–0.1)
EOS PCT: 0 %
Eosinophils Absolute: 0 10*3/uL (ref 0.0–0.7)
HCT: 45.2 % (ref 36.0–46.0)
Hemoglobin: 16 g/dL — ABNORMAL HIGH (ref 12.0–15.0)
LYMPHS ABS: 0.5 10*3/uL — AB (ref 0.7–4.0)
Lymphocytes Relative: 7 %
MCH: 30.9 pg (ref 26.0–34.0)
MCHC: 35.4 g/dL (ref 30.0–36.0)
MCV: 87.4 fL (ref 78.0–100.0)
MONO ABS: 0.3 10*3/uL (ref 0.1–1.0)
Monocytes Relative: 4 %
Neutro Abs: 6.2 10*3/uL (ref 1.7–7.7)
Neutrophils Relative %: 89 %
PLATELETS: 83 10*3/uL — AB (ref 150–400)
RBC: 5.17 MIL/uL — ABNORMAL HIGH (ref 3.87–5.11)
RDW: 16.1 % — AB (ref 11.5–15.5)
WBC Morphology: INCREASED
WBC: 7 10*3/uL (ref 4.0–10.5)

## 2017-09-30 LAB — PROTIME-INR
INR: 1.08
PROTHROMBIN TIME: 13.9 s (ref 11.4–15.2)

## 2017-09-30 LAB — APTT: aPTT: 33 seconds (ref 24–36)

## 2017-09-30 LAB — GLUCOSE, CAPILLARY: GLUCOSE-CAPILLARY: 148 mg/dL — AB (ref 65–99)

## 2017-09-30 LAB — MRSA PCR SCREENING: MRSA by PCR: NEGATIVE

## 2017-09-30 LAB — LACTIC ACID, PLASMA: Lactic Acid, Venous: 6.5 mmol/L (ref 0.5–1.9)

## 2017-09-30 SURGERY — CYSTOSCOPY, WITH STENT INSERTION
Anesthesia: Monitor Anesthesia Care | Site: Ureter | Laterality: Right

## 2017-09-30 MED ORDER — ASPIRIN 300 MG RE SUPP
300.0000 mg | Freq: Every day | RECTAL | Status: DC
  Administered 2017-09-30: 300 mg via RECTAL
  Filled 2017-09-30: qty 1

## 2017-09-30 MED ORDER — VANCOMYCIN HCL 10 G IV SOLR
1250.0000 mg | Freq: Once | INTRAVENOUS | Status: AC
  Administered 2017-09-30: 1250 mg via INTRAVENOUS
  Filled 2017-09-30: qty 1250

## 2017-09-30 MED ORDER — POTASSIUM CHLORIDE 10 MEQ/100ML IV SOLN
INTRAVENOUS | Status: AC
  Administered 2017-09-30: 10 meq via INTRAVENOUS
  Filled 2017-09-30: qty 100

## 2017-09-30 MED ORDER — LIDOCAINE HCL (CARDIAC) 20 MG/ML IV SOLN
INTRAVENOUS | Status: DC | PRN
  Administered 2017-09-30: 25 mg via INTRATRACHEAL

## 2017-09-30 MED ORDER — POTASSIUM CHLORIDE 10 MEQ/100ML IV SOLN
10.0000 meq | INTRAVENOUS | Status: AC
  Administered 2017-09-30 (×2): 10 meq via INTRAVENOUS
  Filled 2017-09-30: qty 100

## 2017-09-30 MED ORDER — STROKE: EARLY STAGES OF RECOVERY BOOK
Freq: Once | Status: DC
  Filled 2017-09-30: qty 1

## 2017-09-30 MED ORDER — ACETAMINOPHEN 325 MG PO TABS: 650.0000 mg | ORAL_TABLET | ORAL | Status: DC | PRN

## 2017-09-30 MED ORDER — LEVOTHYROXINE SODIUM 75 MCG PO TABS: 75.0000 ug | ORAL_TABLET | Freq: Every day | ORAL | Status: DC

## 2017-09-30 MED ORDER — INSULIN ASPART 100 UNIT/ML ~~LOC~~ SOLN
0.0000 [IU] | SUBCUTANEOUS | Status: DC
  Administered 2017-09-30 – 2017-10-01 (×2): 1 [IU] via SUBCUTANEOUS
  Administered 2017-10-01: 2 [IU] via SUBCUTANEOUS
  Administered 2017-10-01 – 2017-10-05 (×12): 1 [IU] via SUBCUTANEOUS
  Administered 2017-10-05 (×2): 2 [IU] via SUBCUTANEOUS
  Administered 2017-10-05: 1 [IU] via SUBCUTANEOUS
  Administered 2017-10-06: 2 [IU] via SUBCUTANEOUS
  Administered 2017-10-06: 1 [IU] via SUBCUTANEOUS
  Administered 2017-10-06 – 2017-10-07 (×2): 2 [IU] via SUBCUTANEOUS
  Administered 2017-10-07: 1 [IU] via SUBCUTANEOUS
  Administered 2017-10-07 (×3): 2 [IU] via SUBCUTANEOUS

## 2017-09-30 MED ORDER — SENNOSIDES-DOCUSATE SODIUM 8.6-50 MG PO TABS: 1.0000 | ORAL_TABLET | Freq: Every evening | ORAL | Status: DC | PRN

## 2017-09-30 MED ORDER — ACETAMINOPHEN 650 MG RE SUPP
650.0000 mg | RECTAL | Status: DC | PRN
  Administered 2017-10-01 (×2): 650 mg via RECTAL
  Filled 2017-09-30 (×3): qty 1

## 2017-09-30 MED ORDER — SODIUM CHLORIDE 0.9 % IV SOLN
1.0000 g | Freq: Once | INTRAVENOUS | Status: AC
  Administered 2017-09-30: 1 g via INTRAVENOUS
  Filled 2017-09-30: qty 1

## 2017-09-30 MED ORDER — SODIUM CHLORIDE 0.9 % IV BOLUS (SEPSIS)
500.0000 mL | Freq: Once | INTRAVENOUS | Status: AC
  Administered 2017-09-30: 500 mL via INTRAVENOUS

## 2017-09-30 MED ORDER — ASPIRIN 325 MG PO TABS: 325.0000 mg | ORAL_TABLET | Freq: Every day | ORAL | Status: DC

## 2017-09-30 MED ORDER — HEPARIN SODIUM (PORCINE) 5000 UNIT/ML IJ SOLN
5000.0000 [IU] | Freq: Three times a day (TID) | INTRAMUSCULAR | Status: DC
  Administered 2017-09-30: 5000 [IU] via SUBCUTANEOUS
  Filled 2017-09-30: qty 1

## 2017-09-30 MED ORDER — MORPHINE SULFATE (PF) 4 MG/ML IV SOLN: 2.0000 mg | INTRAVENOUS | Status: DC | PRN

## 2017-09-30 MED ORDER — SODIUM CHLORIDE 0.9 % IV SOLN
1.0000 g | Freq: Two times a day (BID) | INTRAVENOUS | Status: DC
  Administered 2017-10-01 – 2017-10-02 (×3): 1 g via INTRAVENOUS
  Filled 2017-09-30 (×4): qty 1

## 2017-09-30 MED ORDER — PROPOFOL 10 MG/ML IV BOLUS
INTRAVENOUS | Status: AC
  Filled 2017-09-30: qty 20

## 2017-09-30 MED ORDER — SODIUM CHLORIDE 0.9 % IV SOLN
INTRAVENOUS | Status: AC
  Administered 2017-09-30 – 2017-10-01 (×2): via INTRAVENOUS

## 2017-09-30 MED ORDER — ACETAMINOPHEN 160 MG/5ML PO SOLN
650.0000 mg | ORAL | Status: DC | PRN
  Administered 2017-10-04 – 2017-10-21 (×24): 650 mg
  Filled 2017-09-30 (×24): qty 20.3

## 2017-09-30 MED ORDER — IOPAMIDOL (ISOVUE-300) INJECTION 61%
INTRAVENOUS | Status: AC
  Filled 2017-09-30: qty 50

## 2017-09-30 MED ORDER — VANCOMYCIN HCL IN DEXTROSE 750-5 MG/150ML-% IV SOLN
750.0000 mg | INTRAVENOUS | Status: DC
  Administered 2017-10-01: 750 mg via INTRAVENOUS
  Filled 2017-09-30 (×2): qty 150

## 2017-09-30 MED ORDER — LORAZEPAM 2 MG/ML IJ SOLN: 0.5000 mg | INTRAMUSCULAR | Status: DC | PRN

## 2017-09-30 MED ORDER — PROPOFOL 10 MG/ML IV BOLUS
INTRAVENOUS | Status: DC | PRN
  Administered 2017-09-30 (×2): 10 mg via INTRAVENOUS
  Administered 2017-09-30: 30 mg via INTRAVENOUS

## 2017-09-30 SURGICAL SUPPLY — 19 items
BAG URO CATCHER STRL LF (MISCELLANEOUS) ×1 IMPLANT
BLADE 10 SAFETY STRL DISP (BLADE) ×2 IMPLANT
BUCKET BIOHAZARD WASTE 5 GAL (MISCELLANEOUS) ×2 IMPLANT
CATH URET 5FR 28IN OPEN ENDED (CATHETERS) ×2 IMPLANT
DRAPE CAMERA CLOSED 9X96 (DRAPES) ×2 IMPLANT
GLOVE BIO SURGEON STRL SZ8 (GLOVE) ×4 IMPLANT
GLOVE INDICATOR 7.0 STRL GRN (GLOVE) ×1 IMPLANT
GOWN STRL REUS W/ TWL XL LVL3 (GOWN DISPOSABLE) ×1 IMPLANT
GOWN STRL REUS W/TWL XL LVL3 (GOWN DISPOSABLE) ×2
GUIDEWIRE STR DUAL SENSOR (WIRE) ×1 IMPLANT
H R LUBE JELLY XXX (MISCELLANEOUS) ×2 IMPLANT
KIT ROOM TURNOVER OR (KITS) ×2 IMPLANT
PACK CYSTO (CUSTOM PROCEDURE TRAY) ×2 IMPLANT
PAD ARMBOARD 7.5X6 YLW CONV (MISCELLANEOUS) ×4 IMPLANT
SET CYSTO W/LG BORE CLAMP LF (SET/KITS/TRAYS/PACK) ×1 IMPLANT
STENT URET 6FRX24 CONTOUR (STENTS) ×4 IMPLANT
SYR 30ML LL (SYRINGE) ×1 IMPLANT
TRAY FOLEY W/METER SILVER 16FR (SET/KITS/TRAYS/PACK) ×1 IMPLANT
UNDERPAD 30X30 (UNDERPADS AND DIAPERS) ×2 IMPLANT

## 2017-09-30 NOTE — Consult Note (Addendum)
Urology Consult Note   Requesting Attending Physician:  Briscoe Deutscherpyd, [name] S, MD Service Providing Consult: Urology  Consulting Attending: Enna Warwick   Reason for Consult:  Sepsis   HPI: Ashlee Snow is seen in consultation for reasons noted above at the request of Opyd, Lavone Neriimothy S, MD for evaluation of sepsis of urinary origin and hydronephrosis .  This is a 61 y.o. female with sepsis and stroke who [facility]   Over the past two days, significant altered mental status and confusion. Has had intermittent right flank pain for the past month. Acute worsening this morning in regards to mental status. [facility] and was found febrile to 40.3, tachycardic and tachypneic .   Labs notable for WBC of 13.7, lactate of 11.9, Cr of 1.91 (unknown baseline), and U/a with + nitrites, - LE, 0-5 WBC, and large bacteria. Was fluid resuscitattion and started on Rocephin.   CT a/p revealed right hydrnephrosis to the level of the right iliac ( iliac stent in place) and decompressed bladder with foley tenting right bladder wall. On review of prior scans, hydronephrosis was present on CT angio in 08/2017. Also, CT head performed given AMS. An acute stroke was note.d   [facility].   On arrival, patient is altered and tachycardic to the 120s. Lactate was 6 and temperature was 100.3   Past Medical History: Past Medical History:  Diagnosis Date  . Asthma   . Bronchitis   . DDD (degenerative disc disease)   . Depression   . Dyspnea    on exertion  . Fibromyalgia   . GERD (gastroesophageal reflux disease)    hx. of   . History of depression   . Hypertension   . Hypothyroidism   . Leg pain   . Migraine   . Peripheral arterial disease (HCC)   . Peripheral vascular disease (HCC)   . Sciatica   . 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 Thyroid disease   . 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 Ulcer      Past Surgical History:  Past Surgical History:  Procedure Laterality Date  . ABDOMINAL HYSTERECTOMY    . AORTA - BILATERAL FEMORAL ARTERY BYPASS GRAFT Bilateral 04/14/2017   Procedure: AORTA BIFEMORAL BYPASS GRAFT WITH HEMASHIELD GOLD BIFURCATED 14MMX7 MM GRAFT;  Surgeon: Nada LibmanBrabham, Vance W, MD;  Location: [facility] OR;  Service: Vascular;  Laterality: Bilateral;  . aortogram w/ stenting  2007, 2011, Aug. 2012  . BACK SURGERY  2016   lumbar surgery  . CHOLECYSTECTOMY  01/22/1997   Gall Bladder  . COLON SURGERY  1982   exploratory surgery  . COLONOSCOPY WITH ESOPHAGOGASTRODUODENOSCOPY (EGD)  2010   Dr. Jena Gaussourk: EGD with small hiatal hernia, small bowel biopsy negative, colonoscopy with hyperplastic polyp   . MANDIBLE FRACTURE SURGERY     TMJ surgery - right side  . OVARIAN CYST SURGERY    . TUBAL LIGATION      Medication: Current Facility-Administered Medications  Medication Dose Route Frequency Provider Last Rate Last Dose  .  stroke: mapping our early stages of recovery book   Does not apply Once Opyd, [name] S, MD      . 0.9 %  sodium chloride infusion   Intravenous Continuous Opyd, Lavone Neriimothy S, MD 125 mL/hr at 10/03/2017 2016    . acetaminophen (TYLENOL) tablet 650 mg  650 mg Oral Q4H PRN Opyd, Lavone Neriimothy S, MD  Or  . acetaminophen (TYLENOL) solution 650 mg  650 mg Per Tube Q4H PRN Opyd, Lavone Neriimothy S, MD       Or  . acetaminophen (TYLENOL) suppository 650 mg  650 mg Rectal Q4H PRN Opyd, Lavone Neriimothy S, MD      . aspirin suppository 300 mg  300 mg Rectal Daily Opyd, Lavone Neriimothy S, MD       Or  . aspirin tablet 325 mg  325 mg Oral Daily Opyd, Lavone Neriimothy S, MD      . heparin injection 5,000 Units  5,000 Units Subcutaneous Q8H Opyd, Lavone Neriimothy S, MD      . insulin aspart (novoLOG) injection 0-9 Units  0-9 Units Subcutaneous Q4H Opyd, Lavone Neriimothy S, MD      . Melene Muller[START ON 10/01/2017] levothyroxine (SYNTHROID, LEVOTHROID) tablet 75 mcg  75 mcg Oral QAC breakfast Opyd, Lavone Neriimothy S, MD      . meropenem (MERREM) 1 g in  sodium chloride 0.9 % 100 mL IVPB  1 g Intravenous Once Opyd, [name] S, MD      . potassium chloride 10 mEq in 100 mL IVPB  10 mEq Intravenous Q1 Hr x 2 Opyd, Lavone Neriimothy S, MD 100 mL/hr at 10/14/2017 2032 10 mEq at 10/15/2017 2032  . senna-docusate (Senokot-S) tablet 1 tablet  1 tablet Oral QHS PRN Opyd, Lavone Neriimothy S, MD      . vancomycin (VANCOCIN) 1,250 mg in sodium chloride 0.9 % 250 mL IVPB  1,250 mg Intravenous Once Opyd, Lavone Neriimothy S, MD        Allergies: Allergies  Allergen Reactions  . Ativan [Lorazepam]     Social History: Social History   Tobacco Use  . Smoking status: Current Every Day Smoker    Packs/day: 1.00    Types: E-cigarettes    Start date: 08/03/1975  . Smokeless tobacco: Never Used  . Tobacco comment: Vaper cigarettes "juice", 20mg .  Substance Use Topics  . Alcohol use: No  . Drug use: No    Family History Family History  Problem Relation Age of Onset  . Heart disease Mother   . Cancer Mother        lung  . Arthritis Mother   . Diabetes Mother   . Stroke Mother   . Hypertension Mother   . Heart disease Father   . Arthritis Father   . Heart failure Father   . Hypertension Father   . Stroke Father   . Peripheral vascular disease Father   . Heart disease Sister   . Arthritis Sister   . Kidney disease Sister   . Stroke Sister   . Fibromyalgia Sister   . Colon cancer Neg Hx     Review of Systems 10 systems were reviewed and are negative except as noted specifically in the HPI.  Objective   Vital signs in last 24 hours: BP 106/76 (BP Location: Right Arm)   Pulse (!) 125   Temp 99.5 F (37.5 C) (Rectal)   Resp (!) 29   Wt 65.7 kg (144 lb 13.5 oz)   SpO2 100%   BMI 24.86 kg/m   Physical Exam General: Critically ill in bed    Pulmonary: Increased RR  Cardiovascular: Tachycardic  Abdomen: multiple healed abdm incisions, belly very soft  GU: Foley in place, urine clear  Extremities: warm and well perfused Neuro: Altered, AOx 0  Most Recent  Labs: Lab Results  Component Value Date   WBC 5.7 04/19/2017   HGB 8.9 (L) 04/19/2017   HCT 26.6 (L) 04/19/2017  PLT 166 04/19/2017    Lab Results  Component Value Date   NA 134 (L) 04/19/2017   K 4.4 04/19/2017   CL 105 04/19/2017   CO2 23 04/19/2017   BUN 12 04/19/2017   CREATININE 0.85 04/19/2017   CALCIUM 8.6 (L) 04/19/2017   MG 1.5 (L) 04/15/2017    Lab Results  Component Value Date   INR 1.12 04/14/2017   APTT 32 04/14/2017     IMAGING: Dg Chest Port 1 View  Result Date: 10-04-17 CLINICAL DATA:  Altered mental status EXAM: PORTABLE CHEST 1 VIEW COMPARISON:  2017/10/04 FINDINGS: There is no edema or consolidation. Heart is upper normal in size with pulmonary vascularity within normal limits. No adenopathy. No bone lesions. IMPRESSION: No edema or consolidation. Electronically Signed   By: Bretta Bang III M.D.   On: 10/04/17 20:20    ------  Assessment:  61 y.o. female with sepsis of presumed urinary origin with right hydronephrosis and acute stroke.   Hydronephrosis has been present since at least 08/2017. Hydronephrosis stops at level of right iliac stent and there may be some aspect of external compression vs fibrosis that is contributing to the hydronephrosis.   In the setting of sepsis with nitrite positive urine and hydronephrosis, we recommended emergent right ureteral stent placement. Risk and benefits of procedure were discussed with the husband in detail.   Lastly, on CT the foley catheter is acutely tenting the right bladder wall. On initial evaluation of the scan there was some concern for whether the catheter was in the bladder. On exam, the catheter is draining clear urine and her abdomen is very soft. On vaginal exam, the balloon is easily palpable on the anterior vaginal wall. We will evaluate the bladder cystoscopically in the OR.   Recommendations: - NPO - Agree with neurology consultation  - OR for emergent Right ureteral stent  placement  - Agree with broad spectrum abx   I have reviewed the pt's history and have spoken with the pt. I agree with the above assessment and agree with the plan of action.  Thank you for this consult. Please contact the urology consult pager with any further questions/concerns.  I have examined the pt, discussed her history with Dr Al Corpus, reviewed her imaging studies and labs. I agree with the assessment as well as the plan of urgent cysto/rgp/rt ureteral stenting.

## 2017-09-30 NOTE — Consult Note (Addendum)
Referring Physician: Dr. Antionette Char    Chief Complaint: Stroke on CT  HPI: Ashlee Snow is an 61 y.o. female presenting from OSH with large subacute right cerebellar ischemic infarction with mass effect and pyelonephritis.   She presented to the emergency department for evaluation of confusion, right flank pain, and nausea with vomiting. Patient had reportedly been complaining of right flank pain for a couple weeks now, was otherwise well until the afternoon of 09/28/2017 when her pain increased fairly acutely and she developed nausea with nonbloody vomiting. She did not get out of bed the following day due to her illness and became quite confused by that evening. She had refused to go to the emergency department, but with her condition persisting today, her husband overrode her decision and she was taken to the ED.  [facility] ED Course: Upon arrival to the ED, patient is found to be febrile to 40.3 degree C, saturating well on room air, tachypneic to 30, tachycardic to 150, and with stable blood pressure.  EKG features a sinus tachycardia with rate 141 and nonspecific T wave abnormality in the lateral leads.  Chest x-ray is negative for acute cardiopulmonary disease.  CT abdomen and pelvis is notable for right-sided hydronephrosis and proximal right hydroureter without definite calculus, raising concern for stricture/scar. Lactic acid was elevated to 11.9 the patient was treated with 2 L of lactated Ringer's and 2 g of IV Rocephin in the ED.  A CT head was obtained at the OSH revealing a large subacute right cerebellar infarction with mass effect.   PMHx includes hypertension, coronary artery disease, hypothyroidism, chronic pain, depression, and peripheral arterial disease status post aorto-iliac bypass in June.   CT head from OSH: Large subacute right cerebellar infarction with mass effect.  Past Medical History:  Diagnosis Date  . Asthma   . Bronchitis   . DDD (degenerative disc disease)   .  Depression   . Dyspnea    on exertion  . Fibromyalgia   . GERD (gastroesophageal reflux disease)    hx. of   . History of depression   . Hypertension   . Hypothyroidism   . Leg pain   . Migraine   . Peripheral arterial disease (HCC)   . Peripheral vascular disease (HCC)   . Sciatica   . Stroke (cerebrum) (HCC) 2017-10-02  . Thyroid disease   . 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 Ulcer     Past Surgical History:  Procedure Laterality Date  . ABDOMINAL HYSTERECTOMY    . AORTA - BILATERAL FEMORAL ARTERY BYPASS GRAFT Bilateral 04/14/2017   Procedure: AORTA BIFEMORAL BYPASS GRAFT WITH HEMASHIELD GOLD BIFURCATED 14MMX7 MM GRAFT;  Surgeon: Nada Libman, MD;  Location: [facility] OR;  Service: Vascular;  Laterality: Bilateral;  . aortogram w/ stenting  2007, 2011, Aug. 2012  . BACK SURGERY  2016   lumbar surgery  . CHOLECYSTECTOMY  01/22/1997   Gall Bladder  . COLON SURGERY  1982   exploratory surgery  . COLONOSCOPY WITH ESOPHAGOGASTRODUODENOSCOPY (EGD)  2010   Dr. Jena Gauss: EGD with small hiatal hernia, small bowel biopsy negative, colonoscopy with hyperplastic polyp   . MANDIBLE FRACTURE SURGERY     TMJ surgery - right side  . OVARIAN CYST SURGERY    . TUBAL LIGATION      Family History  Problem Relation Age of Onset  . Heart disease Mother   . Cancer Mother  lung  . Arthritis Mother   . Diabetes Mother   . Stroke Mother   . Hypertension Mother   . Heart disease Father   . Arthritis Father   . Heart failure Father   . Hypertension Father   . Stroke Father   . Peripheral vascular disease Father   . Heart disease Sister   . Arthritis Sister   . Kidney disease Sister   . Stroke Sister   . Fibromyalgia Sister   . Colon cancer Neg Hx    Social History:  reports that she has been smoking e-cigarettes.  She started smoking about 42 years ago. She has been smoking about 1.00 pack per day. she has never used smokeless tobacco. She  reports that she does not drink alcohol or use drugs.  Allergies:  Allergies  Allergen Reactions  . Ativan [Lorazepam] Other (See Comments)    Patient's husband said related to being oversedated not true allergy    Medications:  Prior to Admission:  Medications Prior to Admission  Medication Sig Dispense Refill Last Dose  . BLACK COHOSH PO Take 3 capsules by mouth daily.    Taking  . cephALEXin (KEFLEX) 500 MG capsule Take 1 capsule (500 mg total) by mouth 3 (three) times daily. 30 capsule 0 Taking  . ergocalciferol (VITAMIN D2) 50000 units capsule Take 50,000 Units by mouth once a week. Monday   Taking  . HM ASPIRIN 81 MG chewable tablet Chew 81 mg by mouth 2 (two) times daily.   1 Taking  . levothyroxine (SYNTHROID, LEVOTHROID) 75 MCG tablet Take 75 mcg by mouth daily before breakfast.   Taking  . LINZESS 145 MCG CAPS capsule Take 145 mcg by mouth daily.  0 Taking  . lisinopril (PRINIVIL,ZESTRIL) 20 MG tablet Take 20 mg by mouth every morning.    Taking  . LYRICA 150 MG capsule Take 150 mg by mouth 2 (two) times daily.  1 Taking  . methocarbamol (ROBAXIN) 750 MG tablet Take 750 mg by mouth 3 (three) times daily.    Taking  . metoprolol succinate (TOPROL-XL) 25 MG 24 hr tablet Take 25 mg by mouth every morning.   Taking  . mupirocin ointment (BACTROBAN) 2 % APPLY TO EACH NOSTRIL TWICE DAILY FOR FIVE DAYS  0 Taking  . oxyCODONE (ROXICODONE) 15 MG immediate release tablet Take 15 mg by mouth every 8 (eight) hours as needed for pain.   Not Taking  . oxyCODONE-acetaminophen (PERCOCET) 10-325 MG tablet Take 1 tablet by mouth every 6 (six) hours as needed for pain. 30 tablet 0 Taking  . pravastatin (PRAVACHOL) 20 MG tablet Take 20 mg by mouth every morning.    Taking  . promethazine (PHENERGAN) 25 MG tablet Take 1 tablet by mouth every 6 (six) hours as needed.  2 Not Taking  . sulfamethoxazole-trimethoprim (BACTRIM DS,SEPTRA DS) 800-160 MG tablet Take 1 tablet by mouth 2 (two) times daily.  0  Taking  . venlafaxine XR (EFFEXOR-XR) 75 MG 24 hr capsule Take 150 mg every morning by mouth.   1 Taking   Scheduled: .  stroke: mapping our early stages of recovery book   Does not apply Once  . insulin aspart  0-9 Units Subcutaneous Q4H  . levothyroxine  75 mcg Oral QAC breakfast   Continuous: . sodium chloride 125 mL/hr at 10/10/2017 2016  . meropenem (MERREM) IV    . vancomycin      ROS: Unable to obtain due to AMS.   Physical Examination: Blood  pressure 106/76, pulse (!) 125, temperature 99.5 F (37.5 C), temperature source Rectal, resp. rate (!) 29, weight 65.7 kg (144 lb 13.5 oz), SpO2 100 %.  HEENT: [place]/AT. Oral mucosa are dry with mucus plugs noted on tongue. Left pupil enlarged relative to right.  Lungs: Tachypneic.  Ext: No ecchymoses.  Neurologic Examination: Mental Status: Awake. Impaired attention. Speech is hypophonic, dysarthric, fragmentary and barely intelligible. Able to state her name. Not oriented to year, month, location or situation. [place]. Fatigued-appearing. Does not fixate well on visual stimuli.  Cranial Nerves: II:  No blink to threat on the left. Intact blink to threat on the right. Right pupil 3 mm and reactive. Left pupil 5 mm and sluggishly reactive. Able to count fingers.  III,IV, VI: Saccadic smooth pursuits bilaterally. Crosses midline bilaterally with conjugate EOM, but will not gaze fully to left or right. No nystagmus.  V,VII: No definite facial droop. Reacts to tactile stimulation bilaterally.  VIII: Hearing intact to voice IX,X: Hypophonic speech.   XI: Head at midline XII: midline tongue extension  Motor/Sensory: RUE: Biceps, triceps, deltoid and grip 4+/5 LUE: Biceps, triceps, deltoid and grip 4+/5 RLE: Does not follow command to raise leg or extend knee. Withdraws from noxious with 3-4/5 strength.  LLE: Does not follow command to raise leg or extend knee. Withdraws from noxious with 3/5 strength.  Deep Tendon Reflexes:  Brisk,  low-amplitude 3+ reflexes throughout. Toes equivocal bilaterally.  Cerebellar: Moderately slow FNF on right without past-pointing. Severely slowed FNF on left with past-pointing (optic ataxia).  Gait: Unable to assess   Results for orders placed or performed during the hospital encounter of 10/11/2017 (from the past 48 hour(s))  CBC with Differential     Status: Abnormal (Preliminary result)   Collection Time: 10/16/2017  8:35 PM  Result Value Ref Range   WBC PENDING 4.0 - 10.5 K/uL   RBC 5.17 (H) 3.87 - 5.11 MIL/uL   Hemoglobin 16.0 (H) 12.0 - 15.0 g/dL   HCT 16.145.2 09.636.0 - 04.546.0 %   MCV 87.4 78.0 - 100.0 fL   MCH 30.9 26.0 - 34.0 pg   MCHC 35.4 30.0 - 36.0 g/dL   RDW 40.916.1 (H) 81.111.5 - 91.415.5 %   Platelets PENDING 150 - 400 K/uL   Neutrophils Relative % PENDING %   Neutro Abs PENDING 1.7 - 7.7 K/uL   Band Neutrophils PENDING %   Lymphocytes Relative PENDING %   Lymphs Abs PENDING 0.7 - 4.0 K/uL   Monocytes Relative PENDING %   Monocytes Absolute PENDING 0.1 - 1.0 K/uL   Eosinophils Relative PENDING %   Eosinophils Absolute PENDING 0.0 - 0.7 K/uL   Basophils Relative PENDING %   Basophils Absolute PENDING 0.0 - 0.1 K/uL   WBC Morphology PENDING    RBC Morphology PENDING    Smear Review PENDING    nRBC PENDING 0 /100 WBC   Metamyelocytes Relative PENDING %   Myelocytes PENDING %   Promyelocytes Absolute PENDING %   Blasts PENDING %  Glucose, capillary     Status: Abnormal   Collection Time: 10/14/2017  9:04 PM  Result Value Ref Range   Glucose-Capillary 148 (H) 65 - 99 mg/dL   Dg Chest Port 1 View  Result Date: 09/24/2017 CLINICAL DATA:  Altered mental status EXAM: PORTABLE CHEST 1 VIEW COMPARISON:  September 30, 2017 FINDINGS: There is no edema or consolidation. Heart is upper normal in size with pulmonary vascularity within normal limits. No adenopathy. No bone lesions. IMPRESSION:  No edema or consolidation. Electronically Signed   By: Bretta Bang III M.D.   On: 10/01/2017 20:20     Assessment: 61 y.o. female with large right cerebellar stroke with mass effect 1. DDx for etiology includes cardioembolic, artery-to-artery embolization, paradoxical embolization if she has PFO and in situ thrombosis. May be in a hypercoagulable state due to volume depletion and severe sepsis.  2. Exam findings most consistent with diffuse encephalopathy in the context of infection. Left sided optic ataxia on exam is most consistent with a posterior right parietal lobe lesion. Despite the large right cerebellar infarction, no definite right sided cerebellar ataxia is seen on exam.  3. Thrombocytopenia 4. Stroke Risk Factors - HTN, CAD, PVD   Plan: 1. Urology procedure for obstructed ureter 2. Antibiotics per primary team for pyelonephritis with sepsis 3. MRI and MRA of brain when stable 4. TTE 5. Carotid ultrasound 6. Telemetry monitoring 7. Frequent neuro checks 8. IV hydration 9. Hold off on ASA for now due to thrombocytopenia and high risk of mortality if there is hemorrhagic conversion of the right cerebellar infarction.  45 minutes spent in the evaluation and management of this critically ill stroke patient  @Electronically  signed: Dr. Caryl Pina  10/18/2017, 9:09 PM

## 2017-09-30 NOTE — Anesthesia Preprocedure Evaluation (Addendum)
Anesthesia Evaluation  Patient identified by MRN, date of birth, ID band Patient confused    Reviewed: Allergy & Precautions, NPO status , Patient's Chart, lab work & pertinent test results  Airway Mallampati: II  TM Distance: >3 FB Neck ROM: Full    Dental   Pulmonary shortness of breath, asthma , Current Smoker,    breath sounds clear to auscultation       Cardiovascular hypertension, Pt. on medications + Peripheral Vascular Disease   Rhythm:Regular Rate:Normal     Neuro/Psych  Headaches, CVA    GI/Hepatic Neg liver ROS, GERD  ,  Endo/Other  Hypothyroidism   Renal/GU Renal disease     Musculoskeletal  (+) Arthritis , Fibromyalgia -  Abdominal   Peds  Hematology   Anesthesia Other Findings   Reproductive/Obstetrics                            Lab Results  Component Value Date   WBC 7.0 10/03/2017   HGB 16.0 (H) 10/13/2017   HCT 45.2 10/22/2017   MCV 87.4 10/22/2017   PLT 83 (L) 10/07/2017   Lab Results  Component Value Date   CREATININE 0.85 04/19/2017   BUN 12 04/19/2017   NA 134 (L) 04/19/2017   K 4.4 04/19/2017   CL 105 04/19/2017   CO2 23 04/19/2017    Anesthesia Physical Anesthesia Plan  ASA: IV and emergent  Anesthesia Plan: MAC   Post-op Pain Management:    Induction: Intravenous  PONV Risk Score and Plan: 1 and Propofol infusion, Ondansetron and Treatment may vary due to age or medical condition  Airway Management Planned: Mask and Natural Airway  Additional Equipment:   Intra-op Plan:   Post-operative Plan:   Informed Consent: I have reviewed the patients History and Physical, chart, labs and discussed the procedure including the risks, benefits and alternatives for the proposed anesthesia with the patient or authorized representative who has indicated his/her understanding and acceptance.     Plan Discussed with: CRNA  Anesthesia Plan Comments:          Anesthesia Quick Evaluation

## 2017-09-30 NOTE — Progress Notes (Signed)
Pharmacy Antibiotic Note  Ashlee Snow is a 61 y.o. female admitted on 09/29/2017 with sepsis.  Pharmacy has been consulted for meropenem and vancomycin dosing.  [facility] with sepsis (possibly due to UTI) and stroke. [facility]: WBC 13.7, LA 11.9, UA + (+nitrites, -LE, 0-5 WBC, large bacteria). Patient received 1 dose of ceftriaxone prior to admission here. CT imaging did show R hydronephrosis >> plan emergent R ureteral stent placement per urology notes. Scr elevated at 1.91 (CrCl ~27 mL/min).  Plan: Start vancomycin 1250 mg once then 750 mg every 24 hours Start meropenem 1 g every 12 hours Monitor renal function, cx results, clinical picture, and VT as indicated Follow along with possibilities to de-escalate  Weight: 144 lb 13.5 oz (65.7 kg)  Temp (24hrs), Avg:99.9 F (37.7 C), Min:99.5 F (37.5 C), Max:100.3 F (37.9 C)  Recent Labs  Lab 03-29-17 2035  WBC 7.0  LATICACIDVEN 6.5*    CrCl cannot be calculated (Patient's most recent lab result is older than the maximum 21 days allowed.).    Allergies  Allergen Reactions  . Ativan [Lorazepam] Other (See Comments)    Patient's husband said related to being oversedated not true allergy    Antimicrobials this admission: Vancomycin 12/8 >>  Meropenem 12/8 >>   Dose adjustments this admission: N/A  Microbiology results: 12/8 BCx: sent 12/8 UCx: ordered  12/8 MRSA PCR: negative  Thank you for allowing pharmacy to be a part of this patient's care.  Girard CooterKimberly Perkins, PharmD Clinical Pharmacist  Pager: [phone] Phone: [phone] 10/15/2017 10:15 PM

## 2017-09-30 NOTE — H&P (Addendum)
History and Physical    Ashlee Snow JXB:[id number] DOB: 1956-10-23 DOA: 09/24/2017  PCP: Kirstie Peri, MD   Patient coming from: Home, by way of [facility]  Chief Complaint: Confusion, fevers, N/V, right flank pain   HPI: Ashlee Snow is a 61 y.o. female with medical history significant for hypertension, coronary artery disease, hypothyroidism, chronic pain, depression, and peripheral arterial disease status post aortobifemoral bypass in June, now presenting to the emergency department for evaluation of confusion, right flank pain, and nausea with vomiting.  History is obtained through discussion with patient's husband at the bedside, report of ED personnel, and review of the EMR.  Patient had reportedly been complaining of right flank pain for a couple weeks now, was otherwise well until the afternoon of 09/28/2017 when her pain increased fairly acutely and she developed nausea with nonbloody vomiting.  She did not get out of bed the following day due to her illness and became quite confused by that evening.  She had refused to go to the emergency department, but with her condition persisting today, her husband overrode her decision and she was taken to the ED.  [facility] ED Course: Upon arrival to the ED, patient is found to be febrile to 40.3 degree C, saturating well on room air, tachypneic to 30, tachycardic to 150, and with stable blood pressure.  EKG features a sinus tachycardia with rate 141 and nonspecific T wave abnormality in the lateral leads.  Chest x-ray is negative for acute cardiopulmonary disease.  CT abdomen and pelvis is notable for right-sided hydronephrosis and proximal right hydroureter without definite calculus, raising concern for stricture/scar.  CT head is notable for decreased attenuation throughout the posterior-mid right cerebellum, likely representing acute infarction.  Chemistry panel is notable for potassium 3.1, bicarbonate 15, anion gap 29, and creatinine  1.91, up from 0.85 June 2018.  CBC is notable for leukocytosis to 13,700, mild thrombus cytopenia with platelets on 07/24/1999, and hemoglobin of 15.5, up from 8.9 in June.  Urinalysis features large bacteria and positive nitrites, but only 0-5 WBC.  Lactic acid was elevated to 11.9 the patient was treated with 2 L of lactated Ringer's and 2 g of IV Rocephin in the ED.  Critical care was consulted for admission but recommended a medical admission in the stepdown unit.  Urology was also consulted, as well as neurology.  Gas and improved to 5.2 after fluid resuscitation.  Patient will be admitted to the stepdown unit for ongoing evaluation and management of sepsis suspected secondary to right-sided pyelonephritis, complicated by acute stroke.  Review of Systems:  Unable to complete ROS secondary to patient's clinical condition with acute encephalopathy.  Past Medical History:  Diagnosis Date  . Asthma   . Bronchitis   . DDD (degenerative disc disease)   . Depression   . Dyspnea    on exertion  . Fibromyalgia   . GERD (gastroesophageal reflux disease)    hx. of   . History of depression   . Hypertension   . Hypothyroidism   . Leg pain   . Migraine   . Peripheral arterial disease (HCC)   . Peripheral vascular disease (HCC)   . Sciatica   . 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 Thyroid disease   . 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 Ulcer     Past Surgical History:  Procedure Laterality Date  . ABDOMINAL HYSTERECTOMY    . AORTA - BILATERAL FEMORAL  ARTERY BYPASS GRAFT Bilateral 04/14/2017   Procedure: AORTA BIFEMORAL BYPASS GRAFT WITH HEMASHIELD GOLD BIFURCATED 14MMX7 MM GRAFT;  Surgeon: Ashlee LibmanBrabham, Vance W, MD;  Location: [facility] OR;  Service: Vascular;  Laterality: Bilateral;  . aortogram w/ stenting  2007, 2011, Aug. 2012  . BACK SURGERY  2016   lumbar surgery  . CHOLECYSTECTOMY  01/22/1997   Gall Bladder  . COLON SURGERY  1982   exploratory  surgery  . COLONOSCOPY WITH ESOPHAGOGASTRODUODENOSCOPY (EGD)  2010   Dr. Jena Gaussourk: EGD with small hiatal hernia, small bowel biopsy negative, colonoscopy with hyperplastic polyp   . MANDIBLE FRACTURE SURGERY     TMJ surgery - right side  . OVARIAN CYST SURGERY    . TUBAL LIGATION       reports that she has been smoking e-cigarettes.  She started smoking about 42 years ago. She has been smoking about 1.00 pack per day. she has never used smokeless tobacco. She reports that she does not drink alcohol or use drugs.  Allergies  Allergen Reactions  . Ativan [Lorazepam]     Family History  Problem Relation Age of Onset  . Heart disease Mother   . Cancer Mother        lung  . Arthritis Mother   . Diabetes Mother   . Stroke Mother   . Hypertension Mother   . Heart disease Father   . Arthritis Father   . Heart failure Father   . Hypertension Father   . Stroke Father   . Peripheral vascular disease Father   . Heart disease Sister   . Arthritis Sister   . Kidney disease Sister   . Stroke Sister   . Fibromyalgia Sister   . Colon cancer Neg Hx      Prior to Admission medications   Medication Sig Start Date End Date Taking? Authorizing Provider  BLACK COHOSH PO Take 3 capsules by mouth daily.     [provider]  cephALEXin (KEFLEX) 500 MG capsule Take 1 capsule (500 mg total) by mouth 3 (three) times daily. 05/03/17   Nickel, Carma LairSuzanne L, NP  ergocalciferol (VITAMIN D2) 50000 units capsule Take 50,000 Units by mouth once a week. Monday    [provider]  HM ASPIRIN 81 MG chewable tablet Chew 81 mg by mouth 2 (two) times daily.  03/17/17   [provider]  levothyroxine (SYNTHROID, LEVOTHROID) 75 MCG tablet Take 75 mcg by mouth daily before breakfast.    [provider]  LINZESS 145 MCG CAPS capsule Take 145 mcg by mouth daily. 06/01/17   [provider]  lisinopril (PRINIVIL,ZESTRIL) 20 MG tablet Take 20 mg by mouth every morning.     [provider]  LYRICA 150 MG capsule Take 150 mg by mouth 2 (two) times daily. 03/17/17   [provider]  methocarbamol (ROBAXIN) 750 MG tablet Take 750 mg by mouth 3 (three) times daily.  12/25/13   [provider]  metoprolol succinate (TOPROL-XL) 25 MG 24 hr tablet Take 25 mg by mouth every morning.    [provider]  mupirocin ointment (BACTROBAN) 2 % APPLY TO EACH NOSTRIL TWICE DAILY FOR FIVE DAYS 04/13/17   [provider]  oxyCODONE (ROXICODONE) 15 MG immediate release tablet Take 15 mg by mouth every 8 (eight) hours as needed for pain.    [provider]  oxyCODONE-acetaminophen (PERCOCET) 10-325 MG tablet Take 1 tablet by mouth every 6 (six) hours as needed for pain. 04/20/17  Maris Berger A, PA-C  pravastatin (PRAVACHOL) 20 MG tablet Take 20 mg by mouth every morning.     [provider]  promethazine (PHENERGAN) 25 MG tablet Take 1 tablet by mouth every 6 (six) hours as needed. 03/17/17   [provider]  sulfamethoxazole-trimethoprim (BACTRIM DS,SEPTRA DS) 800-160 MG tablet Take 1 tablet by mouth 2 (two) times daily. 04/10/17   [provider]  venlafaxine XR (EFFEXOR-XR) 75 MG 24 hr capsule Take 150 mg every morning by mouth.  03/17/17   [provider]    Physical Exam: Vitals:   10/22/2017 1948  BP: 106/76  Pulse: (!) 125  Resp: (!) 29  Temp: 99.5 F (37.5 C)  TempSrc: Rectal  SpO2: 100%  Weight: 65.7 kg (144 lb 13.5 oz)      Constitutional: No respiratory distress, pale, confused  Eyes: PERTLA, lids and conjunctivae normal ENMT: Mucous membranes are moist. Posterior pharynx clear of any exudate or lesions.   Neck: normal, supple, no masses, no thyromegaly Respiratory: Breath sound slightly diminished bilaterally, no wheezing, no crackles. Normal respiratory effort.   Cardiovascular: Rate ~110 and regular. No extremity edema. No carotid bruits. No significant JVD. Abdomen: No distension,  soft, tender in right flank. Bowel sounds active.  Musculoskeletal: no clubbing / cyanosis. No joint deformity upper and lower extremities.   Skin: no significant rashes, lesions, ulcers. Mottled extremities. Neurologic: Pupils equal, round, reactive to light. Sensation intact. Moving all extremities. Awake, confused.     Labs on Admission: I have personally reviewed following labs and imaging studies  CBC: No results for input(s): WBC, NEUTROABS, HGB, HCT, MCV, PLT in the last 168 hours. Basic Metabolic Panel: No results for input(s): NA, K, CL, CO2, GLUCOSE, BUN, CREATININE, CALCIUM, MG, PHOS in the last 168 hours. GFR: CrCl cannot be calculated (Patient's most recent lab result is older than the maximum 21 days allowed.). Liver Function Tests: No results for input(s): AST, ALT, ALKPHOS, BILITOT, PROT, ALBUMIN in the last 168 hours. No results for input(s): LIPASE, AMYLASE in the last 168 hours. No results for input(s): AMMONIA in the last 168 hours. Coagulation Profile: No results for input(s): INR, PROTIME in the last 168 hours. Cardiac Enzymes: No results for input(s): CKTOTAL, CKMB, CKMBINDEX, TROPONINI in the last 168 hours. BNP (last 3 results) No results for input(s): PROBNP in the last 8760 hours. HbA1C: No results for input(s): HGBA1C in the last 72 hours. CBG: No results for input(s): GLUCAP in the last 168 hours. Lipid Profile: No results for input(s): CHOL, HDL, LDLCALC, TRIG, CHOLHDL, LDLDIRECT in the last 72 hours. Thyroid Function Tests: No results for input(s): TSH, T4TOTAL, FREET4, T3FREE, THYROIDAB in the last 72 hours. Anemia Panel: No results for input(s): VITAMINB12, FOLATE, FERRITIN, TIBC, IRON, RETICCTPCT in the last 72 hours. Urine analysis:    Component Value Date/Time   COLORURINE YELLOW 04/17/2017 0819   APPEARANCEUR CLEAR 04/17/2017 0819   LABSPEC 1.015 04/17/2017 0819   PHURINE 5.0 04/17/2017 0819   GLUCOSEU NEGATIVE 04/17/2017 0819   HGBUR  NEGATIVE 04/17/2017 0819   BILIRUBINUR NEGATIVE 04/17/2017 0819   KETONESUR NEGATIVE 04/17/2017 0819   PROTEINUR NEGATIVE 04/17/2017 0819   UROBILINOGEN 0.2 01/01/2010 0831   NITRITE NEGATIVE 04/17/2017 0819   LEUKOCYTESUR NEGATIVE 04/17/2017 0819   Sepsis Labs: @LABRCNTIP (procalcitonin:4,lacticidven:4) )No results found for this or any previous visit (from the past 240 hour(s)).   Radiological Exams on Admission: No results found.  EKG: Independently reviewed. Sinus tachycardia (rate 141), non-specific T-wave abnormality in  lateral leads.   Assessment/Plan  1. Severe sepsis secondary to pyelonephritis with ureteral obstruction  - [facility], fe[facility], 3 days of N/V, found to be septic with lactate 11.9 and nitrate-positive urine, clear CXR  - CT abd/pelvis features right-sided hydronephrosis and hydroureter, presumed to represent an acute obstructive pyleo in this clinical setting, but per discussion with urology, scan is similar to one from several months ago, ?scar tissue from aortobifemoral bypass  - Urology consulting and much appreciated, will follow-up on recommendations  - Cultured at outside hospital and repeated here  - Fluid-resuscitated at OSH with improvement in lactate from 12 to 5, continued on IVF  - Treated at OSH with 2 g Rocephin; will continue empiric treatment with meropenem and vancomycin for now given her critical illness and possible obstruction  - Trend lactate, follow cultures and clinical course   2. Acute stroke   - Pt presented with confusion and sepsis, head CT suggestive of acute stroke  - Will consult with neurology, continue cardiac monitoring and frequent neuro checks  - Plan for MRI brain, MRA head, carotid dopplers, echocardiogram, PT/OT/SLP evals, A1c and fasting lipids   3. AKI  - SCr is 1.91, up from priors <1  - Likely prerenal in setting of sepsis and vomiting with marked hypovolemia; ATN possible  - She was  fluid-resuscitated at outside hospital, continued on NS infusion  - CT abd/pelvis with right-sided hydronephrosis and hydroureter, discussed above  - Repeat chem panel in am    4. Hypokalemia  - Serum potassium is 3.1 on admission  - Treated with 20 mEq IV potassium  - Continue cardiac monitoring, repeat chem panel in am    5. Thrombocytopenia  - Platelets 132k on admission - Likely secondary to sepsis, no bleeding evident    6. Depression - Effexor held on admission while NPO   7. Hypertension  - Septic on admission, not hypertensive  - Lisinopril held    8. PAD  - Status-post right aortobifemoral bypass in June - Poor cap-refill bilaterally, and in UE's, likely secondary to #1 rather than acute arterial etiology  - Resume statin as condition allows, continue aspirin as tolerated   9. Hyperglycemia  - Serum glucose 221, no documented hx of DM and no A1c on file  - Likely secondary to sepsis, will follow CBG's and start SSI with Novolog   10. Chronic pain  - Managed at home with Percocet, currently NPO  - Morphine IV prn for now    11. CAD  - Pt has known CAD with hx of stents  - She is critically-ill and will be continued on cardiac monitoring  - Statin held while NPO, lisinopril held in light of AKI, continue ASA PR as tolerated in setting of acute CVA     DVT prophylaxis: sq heparin  Code Status: Full  Family Communication: Husband updated at bedside Disposition Plan: Admit to SDU  Consults called: Urology, neurology Admission status: Inpatient    Briscoe Deutscherimothy S Casady Voshell, MD [facility] Pager [phone]  If 7PM-7AM, please contact night-coverage [email or web address] Password [email or web address]  09/25/2017, 8:09 PM

## 2017-09-30 NOTE — Significant Event (Signed)
Rapid Response Event Note  Overview: Time Called: 1931 Arrival Time: 1933    Initial Focused Assessment: Tachycardic and fever   Interventions: IV NS and Abt  Plan of Care (if not transferred):  Event Summary: Patient was just admitted to [facility]. Patient was reported to have sepsis and new CVA on this admission. At present, patient appears confused at times. NIHSS of 7. Pupils are unequal. Heart sounds regular and tachycardic at rate of 130's. Lungs diminished in the bases with audible rhonchi to upper airway. BP is stable. Skin is cool and mottled. Patient continues receiving IV hydration, antibiotics, and potassium. No further RRT interventions required at this time.   Beryl MeagerHarbison, [place]

## 2017-10-01 ENCOUNTER — Inpatient Hospital Stay (HOSPITAL_COMMUNITY): Payer: BLUE CROSS/BLUE SHIELD

## 2017-10-01 DIAGNOSIS — E872 Acidosis: Secondary | ICD-10-CM

## 2017-10-01 DIAGNOSIS — G9340 Encephalopathy, unspecified: Secondary | ICD-10-CM

## 2017-10-01 DIAGNOSIS — E876 Hypokalemia: Secondary | ICD-10-CM

## 2017-10-01 DIAGNOSIS — I340 Nonrheumatic mitral (valve) insufficiency: Secondary | ICD-10-CM

## 2017-10-01 DIAGNOSIS — N390 Urinary tract infection, site not specified: Secondary | ICD-10-CM

## 2017-10-01 DIAGNOSIS — N179 Acute kidney failure, unspecified: Secondary | ICD-10-CM

## 2017-10-01 LAB — LIPID PANEL
CHOLESTEROL: 147 mg/dL (ref 0–200)
HDL: 34 mg/dL — AB (ref >40)
LDL CALC: 46 mg/dL (ref 0–99)
TRIGLYCERIDES: 335 mg/dL — AB (ref <150)
Total CHOL/HDL Ratio: 4.3 RATIO
VLDL: 67 mg/dL — ABNORMAL HIGH (ref 0–40)

## 2017-10-01 LAB — COMPREHENSIVE METABOLIC PANEL
ALBUMIN: 2.7 g/dL — AB (ref 3.5–5.0)
ALK PHOS: 50 U/L (ref 38–126)
ALT: 72 U/L — ABNORMAL HIGH (ref 14–54)
AST: 183 U/L — AB (ref 15–41)
Anion gap: 12 (ref 5–15)
BILIRUBIN TOTAL: 0.6 mg/dL (ref 0.3–1.2)
BUN: 24 mg/dL — AB (ref 6–20)
CALCIUM: 8.1 mg/dL — AB (ref 8.9–10.3)
CO2: 17 mmol/L — AB (ref 22–32)
Chloride: 107 mmol/L (ref 101–111)
Creatinine, Ser: 1.57 mg/dL — ABNORMAL HIGH (ref 0.44–1.00)
GFR calc Af Amer: 41 mL/min — ABNORMAL LOW (ref >60)
GFR calc non Af Amer: 35 mL/min — ABNORMAL LOW (ref >60)
GLUCOSE: 153 mg/dL — AB (ref 65–99)
Potassium: 3.4 mmol/L — ABNORMAL LOW (ref 3.5–5.1)
SODIUM: 136 mmol/L (ref 135–145)
TOTAL PROTEIN: 6.1 g/dL — AB (ref 6.5–8.1)

## 2017-10-01 LAB — URINALYSIS, ROUTINE W REFLEX MICROSCOPIC
Bilirubin Urine: NEGATIVE
Glucose, UA: NEGATIVE mg/dL
Ketones, ur: 5 mg/dL — AB
Leukocytes, UA: NEGATIVE
Nitrite: NEGATIVE
Protein, ur: 100 mg/dL — AB
Specific Gravity, Urine: 1.017 (ref 1.005–1.030)
pH: 6 (ref 5.0–8.0)

## 2017-10-01 LAB — PROCALCITONIN: Procalcitonin: 41.84 ng/mL

## 2017-10-01 LAB — GLUCOSE, CAPILLARY
GLUCOSE-CAPILLARY: 154 mg/dL — AB (ref 65–99)
GLUCOSE-CAPILLARY: 119 mg/dL — AB (ref 65–99)
GLUCOSE-CAPILLARY: 112 mg/dL — AB (ref 65–99)
GLUCOSE-CAPILLARY: 122 mg/dL — AB (ref 65–99)
GLUCOSE-CAPILLARY: 115 mg/dL — AB (ref 65–99)
Glucose-Capillary: 129 mg/dL — ABNORMAL HIGH (ref 65–99)
Glucose-Capillary: 145 mg/dL — ABNORMAL HIGH (ref 65–99)

## 2017-10-01 LAB — CBC WITH DIFFERENTIAL/PLATELET
BASOS ABS: 0 10*3/uL (ref 0.0–0.1)
Basophils Relative: 0 %
EOS PCT: 0 %
Eosinophils Absolute: 0 10*3/uL (ref 0.0–0.7)
HEMATOCRIT: 37.4 % (ref 36.0–46.0)
Hemoglobin: 13 g/dL (ref 12.0–15.0)
LYMPHS PCT: 9 %
Lymphs Abs: 0.6 10*3/uL — ABNORMAL LOW (ref 0.7–4.0)
MCH: 29.3 pg (ref 26.0–34.0)
MCHC: 34.8 g/dL (ref 30.0–36.0)
MCV: 84.4 fL (ref 78.0–100.0)
MONO ABS: 0.4 10*3/uL (ref 0.1–1.0)
MONOS PCT: 6 %
Neutro Abs: 5.6 10*3/uL (ref 1.7–7.7)
Neutrophils Relative %: 86 %
PLATELETS: 77 10*3/uL — AB (ref 150–400)
RBC: 4.43 MIL/uL (ref 3.87–5.11)
RDW: 15.6 % — AB (ref 11.5–15.5)
WBC: 6.5 10*3/uL (ref 4.0–10.5)

## 2017-10-01 LAB — SODIUM
SODIUM: 145 mmol/L (ref 135–145)
Sodium: 138 mmol/L (ref 135–145)
Sodium: 145 mmol/L (ref 135–145)

## 2017-10-01 LAB — TROPONIN I
TROPONIN I: 1.04 ng/mL — AB (ref <0.03)
Troponin I: 1.07 ng/mL (ref <0.03)
Troponin I: 0.58 ng/mL (ref <0.03)

## 2017-10-01 LAB — TYPE AND SCREEN
ABO/RH(D): O POS
Antibody Screen: NEGATIVE

## 2017-10-01 LAB — HEMOGLOBIN A1C
HEMOGLOBIN A1C: 6.3 % — AB (ref 4.8–5.6)
MEAN PLASMA GLUCOSE: 134.11 mg/dL

## 2017-10-01 LAB — BRAIN NATRIURETIC PEPTIDE: B Natriuretic Peptide: 1579.3 pg/mL — ABNORMAL HIGH (ref 0.0–100.0)

## 2017-10-01 LAB — ECHOCARDIOGRAM COMPLETE
Height: 64 in
Weight: 2236.35 [oz_av]

## 2017-10-01 LAB — LACTIC ACID, PLASMA
Lactic Acid, Venous: 3.3 mmol/L (ref 0.5–1.9)
Lactic Acid, Venous: 3.1 mmol/L (ref 0.5–1.9)

## 2017-10-01 LAB — AMMONIA: Ammonia: 36 umol/L — ABNORMAL HIGH (ref 9–35)

## 2017-10-01 LAB — MAGNESIUM: Magnesium: 1.3 mg/dL — ABNORMAL LOW (ref 1.7–2.4)

## 2017-10-01 MED ORDER — SODIUM CHLORIDE 0.9% FLUSH
10.0000 mL | Freq: Two times a day (BID) | INTRAVENOUS | Status: DC
  Administered 2017-10-01 (×2): 10 mL
  Administered 2017-10-02: 20 mL
  Administered 2017-10-02 – 2017-10-03 (×3): 10 mL
  Administered 2017-10-04: 20 mL
  Administered 2017-10-04 – 2017-10-05 (×2): 10 mL
  Administered 2017-10-06: 30 mL
  Administered 2017-10-07: 10 mL
  Administered 2017-10-07 – 2017-10-08 (×2): 20 mL
  Administered 2017-10-08 – 2017-10-09 (×2): 10 mL

## 2017-10-01 MED ORDER — PANTOPRAZOLE SODIUM 40 MG IV SOLR
40.0000 mg | INTRAVENOUS | Status: DC
  Administered 2017-10-01 – 2017-10-10 (×10): 40 mg via INTRAVENOUS
  Filled 2017-10-01 (×9): qty 40

## 2017-10-01 MED ORDER — POTASSIUM CHLORIDE 10 MEQ/100ML IV SOLN
10.0000 meq | INTRAVENOUS | Status: AC
  Administered 2017-10-01 (×2): 10 meq via INTRAVENOUS
  Filled 2017-10-01 (×2): qty 100

## 2017-10-01 MED ORDER — IOPAMIDOL (ISOVUE-300) INJECTION 61%
INTRAVENOUS | Status: DC | PRN
  Administered 2017-10-01: 5 mL via URETHRAL

## 2017-10-01 MED ORDER — METOPROLOL TARTRATE 5 MG/5ML IV SOLN
INTRAVENOUS | Status: AC
  Administered 2017-10-01: 5 mg
  Filled 2017-10-01: qty 5

## 2017-10-01 MED ORDER — LEVOTHYROXINE SODIUM 100 MCG IV SOLR
37.5000 ug | Freq: Every day | INTRAVENOUS | Status: DC
  Administered 2017-10-01 – 2017-10-06 (×6): 37.5 ug via INTRAVENOUS
  Filled 2017-10-01 (×6): qty 5

## 2017-10-01 MED ORDER — LORAZEPAM 2 MG/ML IJ SOLN
INTRAMUSCULAR | Status: AC
  Filled 2017-10-01: qty 1

## 2017-10-01 MED ORDER — SODIUM CHLORIDE 0.9% FLUSH: 10.0000 mL | INTRAVENOUS | Status: DC | PRN

## 2017-10-01 MED ORDER — SODIUM CHLORIDE 3 % IV SOLN
INTRAVENOUS | Status: AC
  Administered 2017-10-01 – 2017-10-02 (×3): 50 mL/h via INTRAVENOUS
  Filled 2017-10-01 (×5): qty 500

## 2017-10-01 MED ORDER — MAGNESIUM SULFATE 4 GM/100ML IV SOLN
4.0000 g | Freq: Once | INTRAVENOUS | Status: AC
  Administered 2017-10-01: 4 g via INTRAVENOUS
  Filled 2017-10-01: qty 100

## 2017-10-01 MED ORDER — STERILE WATER FOR IRRIGATION IR SOLN
Status: DC | PRN
  Administered 2017-09-30: 3000 mL

## 2017-10-01 MED ORDER — SODIUM CHLORIDE 0.9 % IV SOLN: 250.0000 mL | INTRAVENOUS | Status: DC | PRN

## 2017-10-01 MED ORDER — POTASSIUM CHLORIDE IN NACL 20-0.9 MEQ/L-% IV SOLN
INTRAVENOUS | Status: DC
  Administered 2017-10-01 – 2017-10-02 (×2): via INTRAVENOUS
  Filled 2017-10-01 (×2): qty 1000

## 2017-10-01 MED ORDER — CHLORHEXIDINE GLUCONATE CLOTH 2 % EX PADS
6.0000 | MEDICATED_PAD | Freq: Every day | CUTANEOUS | Status: DC
  Administered 2017-10-01 – 2017-10-08 (×8): 6 via TOPICAL

## 2017-10-01 MED ORDER — LABETALOL HCL 5 MG/ML IV SOLN
5.0000 mg | INTRAVENOUS | Status: DC | PRN
  Administered 2017-10-01 – 2017-10-04 (×9): 5 mg via INTRAVENOUS
  Filled 2017-10-01 (×9): qty 4

## 2017-10-01 NOTE — Progress Notes (Signed)
S/O: Status post ureteral stent placement  BP 133/85   Pulse (!) 102   Temp (!) 102.5 F (39.2 C) (Oral)   Resp (!) 41   Ht 5\' 4"  (1.626 m)   Wt 63.4 kg (139 lb 12.4 oz)   SpO2 94%   BMI 23.99 kg/m   Ment: Lethargic. Counts fingers correctly. Gives one word answer to question. Not using sentences to communicate. Follows simple motor commands. CN: Left pupil 5 mm and sluggishly reactive. Right pupil 4 mm and reactive. Tracks examiner to left and right but with more hesitancy when gazing to left. Some facial asymmetry noted.  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 There is question of possible clinical worsening per nursing. On my exam there is decreased verbal output and now mentation classifiable as lethargic which is worsened from alert with moderately decreased level of alertness seen on prior exam.  2. Obtaining repeat CT head to assess for stability versus worsened edema and mass effect from right cerebellar infarction.  3. Will start 3% saline at 50 cc/hr 4. Will consult Neurosurgery to have them on board in the event that a suboccipital decompressive craniectomy is indicated.   20 minutes of ICU time.   Electronically signed: Dr. Caryl PinaEric Magdalene Tardiff

## 2017-10-01 NOTE — Progress Notes (Signed)
SLP Cancellation Note  Patient Details Name: Ashlee Snow MRN: [id number] DOB: May 14, 1956   Cancelled treatment:       Reason Eval/Treat Not Completed: Patient at procedure or test/unavailable. Pt having echo, will continue efforts.  [name], [place]MS, CCC-SLP Speech-Language Pathologist [phone]   [name] 10/01/2017, 9:35 AM

## 2017-10-01 NOTE — Progress Notes (Signed)
*  PRELIMINARY RESULTS* Echocardiogram 2D Echocardiogram has been performed.  Jeryl Columbialliott, Inanna Telford 10/01/2017, 9:58 AM

## 2017-10-01 NOTE — Consult Note (Signed)
Reason for Consult: Right cerebellar stroke Referring Physician: Neurology  Ashlee Snow is an 61 y.o. female.  HPI: 61 year old female admitted last night with fever sepsis diagnosed with pyelonephritis and also noted to have a right cerebellar stroke. Husband feels patient became symptomatically primarily on Thursday with dizziness nausea. Patient with follow-up CT this morning shows right cerebellar stroke essentially unchanged from yesterday.. Fourth ventricle still open no signs of hydrocephalus.  Past Medical History:  Diagnosis Date  . Asthma   . Bronchitis   . DDD (degenerative disc disease)   . Depression   . Dyspnea    on exertion  . Fibromyalgia   . GERD (gastroesophageal reflux disease)    hx. of   . History of depression   . Hypertension   . Hypothyroidism   . Leg pain   . Migraine   . Peripheral arterial disease ([place])   . Peripheral vascular disease ([place])   . Sciatica   . Stroke (cerebrum) ([place]) 10/13/2017  . Thyroid disease   . 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 Ulcer     Past Surgical History:  Procedure Laterality Date  . ABDOMINAL HYSTERECTOMY    . AORTA - BILATERAL FEMORAL ARTERY BYPASS GRAFT Bilateral 04/14/2017   Procedure: AORTA BIFEMORAL BYPASS GRAFT WITH HEMASHIELD GOLD BIFURCATED 14MMX7 MM GRAFT;  Surgeon: Serafina Mitchell, MD;  Location: [facility];  Service: Vascular;  Laterality: Bilateral;  . aortogram w/ stenting  2007, 2011, Aug. 2012  . BACK SURGERY  2016   lumbar surgery  . CHOLECYSTECTOMY  01/22/1997   Gall Bladder  . COLON SURGERY  1982   exploratory surgery  . COLONOSCOPY WITH ESOPHAGOGASTRODUODENOSCOPY (EGD)  2010   Dr. Gala Romney: EGD with small hiatal hernia, small bowel biopsy negative, colonoscopy with hyperplastic polyp   . MANDIBLE FRACTURE SURGERY     TMJ surgery - right side  . OVARIAN CYST SURGERY    . TUBAL LIGATION      Family History  Problem Relation Age of Onset  . Heart disease  Mother   . Cancer Mother        lung  . Arthritis Mother   . Diabetes Mother   . Stroke Mother   . Hypertension Mother   . Heart disease Father   . Arthritis Father   . Heart failure Father   . Hypertension Father   . Stroke Father   . Peripheral vascular disease Father   . Heart disease Sister   . Arthritis Sister   . Kidney disease Sister   . Stroke Sister   . Fibromyalgia Sister   . Colon cancer Neg Hx     Social History:  reports that she has been smoking e-cigarettes.  She started smoking about 42 years ago. She has been smoking about 1.00 pack per day. she has never used smokeless tobacco. She reports that she does not drink alcohol or use drugs.  Allergies:  Allergies  Allergen Reactions  . Ativan [Lorazepam] Other (See Comments)    Patient's husband said related to being oversedated not true allergy    Medications: I have reviewed the patient's current medications.  Results for orders placed or performed during the hospital encounter of 10/13/2017 (from the past 48 hour(s))  MRSA PCR Screening     Status: None   Collection Time: 09/27/2017  7:52 PM  Result Value Ref Range   MRSA by PCR NEGATIVE NEGATIVE    Comment:  The GeneXpert MRSA Assay (FDA approved for NASAL specimens only), is one component of a comprehensive MRSA colonization surveillance program. It is not intended to diagnose MRSA infection nor to guide or monitor treatment for MRSA infections.   CBC with Differential     Status: Abnormal   Collection Time: 10/12/2017  8:35 PM  Result Value Ref Range   WBC 7.0 4.0 - 10.5 K/uL    Comment: REPEATED TO VERIFY WHITE COUNT CONFIRMED ON SMEAR    RBC 5.17 (H) 3.87 - 5.11 MIL/uL   Hemoglobin 16.0 (H) 12.0 - 15.0 g/dL   HCT 45.2 36.0 - 46.0 %   MCV 87.4 78.0 - 100.0 fL   MCH 30.9 26.0 - 34.0 pg   MCHC 35.4 30.0 - 36.0 g/dL   RDW 16.1 (H) 11.5 - 15.5 %   Platelets 83 (L) 150 - 400 K/uL    Comment: SPECIMEN CHECKED FOR CLOTS REPEATED TO  VERIFY PLATELET COUNT CONFIRMED BY SMEAR    Neutrophils Relative % 89 %   Lymphocytes Relative 7 %   Monocytes Relative 4 %   Eosinophils Relative 0 %   Basophils Relative 0 %   Neutro Abs 6.2 1.7 - 7.7 K/uL   Lymphs Abs 0.5 (L) 0.7 - 4.0 K/uL   Monocytes Absolute 0.3 0.1 - 1.0 K/uL   Eosinophils Absolute 0.0 0.0 - 0.7 K/uL   Basophils Absolute 0.0 0.0 - 0.1 K/uL   WBC Morphology INCREASED BANDS (>20% BANDS)     Comment: VACUOLATED NEUTROPHILS  Lactic acid, plasma     Status: Abnormal   Collection Time: 09/29/2017  8:35 PM  Result Value Ref Range   Lactic Acid, Venous 6.5 (HH) 0.5 - 1.9 mmol/L    Comment: CRITICAL RESULT CALLED TO, READ BACK BY AND VERIFIED WITH: T.NEILSON,RN [id number] 09/23/2017 M.CAMPBELL   Protime-INR     Status: None   Collection Time: 09/26/2017  9:04 PM  Result Value Ref Range   Prothrombin Time 13.9 11.4 - 15.2 seconds   INR 1.08   APTT     Status: None   Collection Time: 10/04/2017  9:04 PM  Result Value Ref Range   aPTT 33 24 - 36 seconds  Glucose, capillary     Status: Abnormal   Collection Time: 10/07/2017  9:04 PM  Result Value Ref Range   Glucose-Capillary 148 (H) 65 - 99 mg/dL  Type and screen [place]     Status: None   Collection Time: 10/01/17 12:40 AM  Result Value Ref Range   ABO/RH(D) O POS    Antibody Screen NEG    Sample Expiration 10/04/2017   Glucose, capillary     Status: Abnormal   Collection Time: 10/01/17  1:18 AM  Result Value Ref Range   Glucose-Capillary 129 (H) 65 - 99 mg/dL   Comment 1 Notify RN   Lactic acid, plasma     Status: Abnormal   Collection Time: 10/01/17  3:17 AM  Result Value Ref Range   Lactic Acid, Venous 3.3 (HH) 0.5 - 1.9 mmol/L    Comment: CRITICAL RESULT CALLED TO, READ BACK BY AND VERIFIED WITH: S.ADHAHN,RN [id number] 10/01/17 M.CAMPBELL   Brain natriuretic peptide     Status: Abnormal   Collection Time: 10/01/17  3:17 AM  Result Value Ref Range   B Natriuretic Peptide 1,579.3 (H) 0.0 - 100.0  pg/mL  Ammonia     Status: Abnormal   Collection Time: 10/01/17  3:17 AM  Result Value Ref Range  Ammonia 36 (H) 9 - 35 umol/L  Troponin I (q 6hr x 3)     Status: Abnormal   Collection Time: 10/01/17  3:17 AM  Result Value Ref Range   Troponin I 1.04 (HH) <0.03 ng/mL    Comment: CRITICAL RESULT CALLED TO, READ BACK BY AND VERIFIED WITH: J.CARMICHEAL,RN [id number] 10/01/17 M.CAMPBELL   Magnesium     Status: Abnormal   Collection Time: 10/01/17  3:17 AM  Result Value Ref Range   Magnesium 1.3 (L) 1.7 - 2.4 mg/dL  Urinalysis, Routine w reflex microscopic     Status: Abnormal   Collection Time: 10/01/17  3:30 AM  Result Value Ref Range   Color, Urine YELLOW YELLOW   APPearance CLOUDY (A) CLEAR   Specific Gravity, Urine 1.017 1.005 - 1.030   pH 6.0 5.0 - 8.0   Glucose, UA NEGATIVE NEGATIVE mg/dL   Hgb urine dipstick LARGE (A) NEGATIVE   Bilirubin Urine NEGATIVE NEGATIVE   Ketones, ur 5 (A) NEGATIVE mg/dL   Protein, ur 100 (A) NEGATIVE mg/dL   Nitrite NEGATIVE NEGATIVE   Leukocytes, UA NEGATIVE NEGATIVE   RBC / HPF TOO NUMEROUS TO COUNT 0 - 5 RBC/hpf   WBC, UA 0-5 0 - 5 WBC/hpf   Bacteria, UA RARE (A) NONE SEEN   Squamous Epithelial / LPF 0-5 (A) NONE SEEN   Mucus PRESENT   Comprehensive metabolic panel     Status: Abnormal   Collection Time: 10/01/17  3:33 AM  Result Value Ref Range   Sodium 136 135 - 145 mmol/L   Potassium 3.4 (L) 3.5 - 5.1 mmol/L   Chloride 107 101 - 111 mmol/L   CO2 17 (L) 22 - 32 mmol/L   Glucose, Bld 153 (H) 65 - 99 mg/dL   BUN 24 (H) 6 - 20 mg/dL   Creatinine, Ser 1.57 (H) 0.44 - 1.00 mg/dL   Calcium 8.1 (L) 8.9 - 10.3 mg/dL   Total Protein 6.1 (L) 6.5 - 8.1 g/dL   Albumin 2.7 (L) 3.5 - 5.0 g/dL   AST 183 (H) 15 - 41 U/L   ALT 72 (H) 14 - 54 U/L   Alkaline Phosphatase 50 38 - 126 U/L   Total Bilirubin 0.6 0.3 - 1.2 mg/dL   GFR calc non Af Amer 35 (L) >60 mL/min   GFR calc Af Amer 41 (L) >60 mL/min    Comment: (NOTE) The eGFR has been calculated  using the CKD EPI equation. This calculation has not been validated in all clinical situations. eGFR's persistently <60 mL/min signify possible Chronic Kidney Disease.    Anion gap 12 5 - 15  CBC with Differential/Platelet     Status: Abnormal   Collection Time: 10/01/17  3:33 AM  Result Value Ref Range   WBC 6.5 4.0 - 10.5 K/uL   RBC 4.43 3.87 - 5.11 MIL/uL   Hemoglobin 13.0 12.0 - 15.0 g/dL   HCT 37.4 36.0 - 46.0 %   MCV 84.4 78.0 - 100.0 fL   MCH 29.3 26.0 - 34.0 pg   MCHC 34.8 30.0 - 36.0 g/dL   RDW 15.6 (H) 11.5 - 15.5 %   Platelets 77 (L) 150 - 400 K/uL    Comment: CONSISTENT WITH PREVIOUS RESULT   Neutrophils Relative % 86 %   Neutro Abs 5.6 1.7 - 7.7 K/uL   Lymphocytes Relative 9 %   Lymphs Abs 0.6 (L) 0.7 - 4.0 K/uL   Monocytes Relative 6 %   Monocytes Absolute 0.4 0.1 -  1.0 K/uL   Eosinophils Relative 0 %   Eosinophils Absolute 0.0 0.0 - 0.7 K/uL   Basophils Relative 0 %   Basophils Absolute 0.0 0.0 - 0.1 K/uL  Procalcitonin     Status: None   Collection Time: 10/01/17  3:33 AM  Result Value Ref Range   Procalcitonin 41.84 ng/mL    Comment:        Interpretation: PCT >= 10 ng/mL: Important systemic inflammatory response, almost exclusively due to severe bacterial sepsis or septic shock. (NOTE)       Sepsis PCT Algorithm           Lower Respiratory Tract                                      Infection PCT Algorithm    ----------------------------     ----------------------------         PCT < 0.25 ng/mL                PCT < 0.10 ng/mL         Strongly encourage             Strongly discourage   discontinuation of antibiotics    initiation of antibiotics    ----------------------------     -----------------------------       PCT 0.25 - 0.50 ng/mL            PCT 0.10 - 0.25 ng/mL               OR       >80% decrease in PCT            Discourage initiation of                                            antibiotics      Encourage discontinuation           of  antibiotics    ----------------------------     -----------------------------         PCT >= 0.50 ng/mL              PCT 0.26 - 0.50 ng/mL                AND       <80% decrease in PCT             Encourage initiation of                                             antibiotics       Encourage continuation           of antibiotics    ----------------------------     -----------------------------        PCT >= 0.50 ng/mL                  PCT > 0.50 ng/mL               AND         increase in PCT                  Strongly encourage  initiation of antibiotics    Strongly encourage escalation           of antibiotics                                     -----------------------------                                           PCT <= 0.25 ng/mL                                                 OR                                        > 80% decrease in PCT                                     Discontinue / Do not initiate                                             antibiotics   Lactic acid, plasma     Status: Abnormal   Collection Time: 10/01/17  4:20 AM  Result Value Ref Range   Lactic Acid, Venous 3.1 (HH) 0.5 - 1.9 mmol/L    Comment: CRITICAL RESULT CALLED TO, READ BACK BY AND VERIFIED WITH: J.CARMICHAEL,RN [id number] 10/01/17 M.CAMPBELL   Troponin I (q 6hr x 3)     Status: Abnormal   Collection Time: 10/01/17  4:20 AM  Result Value Ref Range   Troponin I 1.07 (HH) <0.03 ng/mL    Comment: CRITICAL VALUE NOTED.  VALUE IS CONSISTENT WITH PREVIOUSLY REPORTED AND CALLED VALUE.  Lipid panel     Status: Abnormal   Collection Time: 10/01/17  4:20 AM  Result Value Ref Range   Cholesterol 147 0 - 200 mg/dL   Triglycerides 335 (H) <150 mg/dL   HDL 34 (L) >40 mg/dL   Total CHOL/HDL Ratio 4.3 RATIO   VLDL 67 (H) 0 - 40 mg/dL   LDL Cholesterol 46 0 - 99 mg/dL    Comment:        Total Cholesterol/HDL:CHD Risk Coronary Heart Disease Risk Table                      Men   Women  1/2 Average Risk   3.4   3.3  Average Risk       5.0   4.4  2 X Average Risk   9.6   7.1  3 X Average Risk  23.4   11.0        Use the calculated Patient Ratio above and the CHD Risk Table to determine the patient's CHD Risk.        ATP III CLASSIFICATION (LDL):  <100     mg/dL   Optimal  100-129  mg/dL   Near or Above  Optimal  130-159  mg/dL   Borderline  160-189  mg/dL   High  >190     mg/dL   Very High   Glucose, capillary     Status: Abnormal   Collection Time: 10/01/17  4:31 AM  Result Value Ref Range   Glucose-Capillary 154 (H) 65 - 99 mg/dL  Glucose, capillary     Status: Abnormal   Collection Time: 10/01/17  7:58 AM  Result Value Ref Range   Glucose-Capillary 119 (H) 65 - 99 mg/dL  Sodium     Status: None   Collection Time: 10/01/17  8:06 AM  Result Value Ref Range   Sodium 138 135 - 145 mmol/L    Ct Head Wo Contrast  Result Date: 10/01/2017 CLINICAL DATA:  Acute mental status change with lethargy. EXAM: CT HEAD WITHOUT CONTRAST TECHNIQUE: Contiguous axial images were obtained from the base of the skull through the vertex without intravenous contrast. COMPARISON:  10/19/2017 and multiple previous FINDINGS: Brain: Similar appearance of the acute infarction affecting the right cerebellar hemisphere, probably PICA distribution, with swelling and some mass-effect upon the fourth ventricle but no change in size of the lateral or third ventricles. No evidence of hemorrhage. Old right parietal cortical and subcortical infarction and old right posteromedial temporal and occipital infarction. This same. Old right white matter track lacunar infarction appears the same. Vascular: There is atherosclerotic calcification of the major vessels at the base of the brain. Skull: Normal Sinuses/Orbits: Clear/normal Other: None IMPRESSION: No significant change since yesterday. Acute infarction affecting the right cerebellum with swelling. Mass-effect upon the  fourth ventricle but no change in size the lateral or third ventricles at this time. No hemorrhage. 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 Electronically Signed   By: Nelson Chimes M.D.   On: 10/01/2017 07:45   Dg Chest Port 1 View  Result Date: 10/01/2017 CLINICAL DATA:  61 year old female with the tachypnea. EXAM: PORTABLE CHEST 1 VIEW COMPARISON:  Chest radiograph dated 10/19/2017 FINDINGS: Minimal bibasilar atelectatic changes. There is no focal consolidation, pleural effusion, or pneumothorax. The cardiac silhouette is within normal limits. No acute osseous pathology. IMPRESSION: No active disease. Electronically Signed   By: Anner Crete M.D.   On: 10/01/2017 03:07   Dg Chest Port 1 View  Result Date: 10/04/2017 CLINICAL DATA:  Altered mental status EXAM: PORTABLE CHEST 1 VIEW COMPARISON:  September 30, 2017 FINDINGS: There is no edema or consolidation. Heart is upper normal in size with pulmonary vascularity within normal limits. No adenopathy. No bone lesions. IMPRESSION: No edema or consolidation. Electronically Signed   By: Lowella Grip III M.D.   On: 10/22/2017 20:20    Review of Systems  Unable to perform ROS: Patient nonverbal   Blood pressure 136/83, pulse (!) 103, temperature (!) 100.8 F (38.2 C), temperature source Axillary, resp. rate (!) 38, height '5\' 4"'  (1.626 m), weight 63.4 kg (139 lb 12.4 oz), SpO2 96 %. Physical Exam  Neurological: She is alert.  Patient is awake and alert and able to equal patient follows commands bilaterally and symmetrically with what appears to be equal strength OD5-3, OS 6-4.    Assessment/Plan: Patient is awake and alert I do not think the patient needs neurosurgical decompression at this time. Is a very good chance she will not need it. I agree with aggressive medical treatment with 3% saline. I've extensively talked about the risks benefits of surgery with the patient and her husband and do agree that should be kept  as a last resort.  Levander Katzenstein P 10/01/2017, 9:07 AM

## 2017-10-01 NOTE — Progress Notes (Signed)
PT Cancellation Note  Patient Details Name: Leane ParaDebra A Naas MRN: [id number] DOB: June 21, 1956   Cancelled Treatment:    Reason Eval/Treat Not Completed: Medical issues which prohibited therapy   Briefly discussed Ms. Hal HopeGrant's medical status with her nurse, who informed me that she will be going to MRI soon due to clinical worsening;   Will follow up later today as time allows;  Otherwise, will follow up for PT tomorrow;   Thank you,  Van ClinesHolly Rochanda Harpham, PT  Acute Rehabilitation Services Pager [phone] Office [phone]     Levi AlandHolly H Alizza Sacra 10/01/2017, 7:55 AM

## 2017-10-01 NOTE — Progress Notes (Signed)
eLink Physician-Brief Progress Note Patient Name: Ashlee ParaDebra A Snow DOB: 11-06-1955 MRN: [id number]   Date of Service  10/01/2017  HPI/Events of Note  Best Practice Hypokalemia  eICU Interventions  Placed on PPI for stress ulcer propy in the setting of stroke Potassium replaced     Intervention Category Intermediate Interventions: Best-practice therapies (e.g. DVT, beta blocker, etc.);Electrolyte abnormality - evaluation and management  DETERDING,ELIZABETH 10/01/2017, 4:34 AM

## 2017-10-01 NOTE — Progress Notes (Signed)
Peripherally Inserted Central Catheter/Midline Placement  The IV Nurse has discussed with the patient and/or persons authorized to consent for the patient, the purpose of this procedure and the potential benefits and risks involved with this procedure.  The benefits include less needle sticks, lab draws from the catheter, and the patient may be discharged home with the catheter. Risks include, but not limited to, infection, bleeding, blood clot (thrombus formation), and puncture of an artery; nerve damage and irregular heartbeat and possibility to perform a PICC exchange if needed/ordered by physician.  Alternatives to this procedure were also discussed.  Bard Power PICC patient education guide, fact sheet on infection prevention and patient information card has been provided to patient /or left at bedside.  Pt unable to sign due to mental status changes, unable to follow commands, expressive aphasia present.  Husband gave consent.  PICC/Midline Placement Documentation  PICC Double Lumen 10/01/17 PICC Right Basilic 35 cm 0 cm (Active)  Indication for Insertion or Continuance of Line Administration of hyperosmolar/irritating solutions (i.e. TPN, Vancomycin, etc.);Prolonged intravenous therapies 10/01/2017 10:47 AM  Exposed Catheter (cm) 0 cm 10/01/2017 10:47 AM  Site Assessment Clean;Dry;Intact 10/01/2017 10:47 AM  Lumen #1 Status Flushed;Saline locked;Blood return noted 10/01/2017 10:47 AM  Lumen #2 Status Flushed;Saline locked;Blood return noted 10/01/2017 10:47 AM  Dressing Type Transparent 10/01/2017 10:47 AM  Dressing Status Clean;Dry;Intact;Antimicrobial disc in place 10/01/2017 10:47 AM  Line Care Connections checked and tightened 10/01/2017 10:47 AM  Line Adjustment (NICU/IV Team Only) No 10/01/2017 10:47 AM  Dressing Intervention New dressing 10/01/2017 10:47 AM  Dressing Change Due 10/08/17 10/01/2017 10:47 AM       Elliot Dallyiggs, Alejandria Wessells Wright 10/01/2017, 10:48 AM

## 2017-10-01 NOTE — Consult Note (Signed)
PULMONARY / CRITICAL CARE MEDICINE   Name: Ashlee Snow MRN: [id number] DOB: [birth year]    ADMISSION DATE:  10/20/2017 CONSULTATION DATE:  10/01/2017   REFERRING MD:  Opyd  CHIEF COMPLAINT:  AMS  HISTORY OF PRESENT ILLNESS:   61 yr old lady who has multiple comorbidity including CAD, HTN, fibromyalgia who was transferred from outside hospital after presenting there with acute CVA and UTI with right hydronephrosis. She went for Rt IJ stent tonight and we were called due to altered mental status and concern for airway protection. Patient was accepted to come to the ICU and upon evaluation in the ICU her vitals were stable after receiving metoprolol for SBP >200 and she is on nasal canula protecting her airway. Patient is not in any distress but she is non verbal and not able to communicate.she is being treated for UTI with meropenem and vanco.     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  PAST SURGICAL HISTORY: She  has a past surgical history that includes Cholecystectomy (01/22/1997); Abdominal hysterectomy; Ovarian cyst surgery; aortogram w/ stenting (2007, 2011, Aug. 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  Allergies  Allergen Reactions  . Ativan [Lorazepam] Other (See Comments)    Patient's husband said related to being oversedated not true allergy    No current facility-administered medications  on file prior to encounter.    Current Outpatient Medications on File Prior to Encounter  Medication Sig  . BLACK COHOSH PO Take 3 capsules by mouth daily.   . cephALEXin (KEFLEX) 500 MG capsule Take 1 capsule (500 mg total) by mouth 3 (three) times daily.  . ergocalciferol (VITAMIN D2) 50000 units capsule Take 50,000 Units by mouth once a week. Monday  . HM ASPIRIN 81 MG chewable tablet Chew 81 mg by mouth 2 (two) times daily.   Marland Kitchen. levothyroxine (SYNTHROID, LEVOTHROID) 75 MCG tablet Take 75 mcg by mouth daily before breakfast.  . LINZESS 145 MCG CAPS capsule Take 145 mcg by mouth daily.  Marland Kitchen. lisinopril (PRINIVIL,ZESTRIL) 20 MG tablet Take 20 mg by mouth every morning.   Marland Kitchen. LYRICA 150 MG capsule Take 150 mg by mouth 2 (two) times daily.  . methocarbamol (ROBAXIN) 750 MG tablet Take 750 mg by mouth 3 (three) times daily.   . metoprolol succinate (TOPROL-XL) 25 MG 24 hr tablet Take 25 mg by mouth every morning.  . mupirocin ointment (BACTROBAN) 2 % APPLY TO EACH NOSTRIL TWICE DAILY FOR FIVE DAYS  . oxyCODONE (ROXICODONE) 15 MG immediate release tablet Take 15 mg by mouth every 8 (eight) hours as needed for pain.  Marland Kitchen. oxyCODONE-acetaminophen (PERCOCET) 10-325 MG tablet Take 1 tablet by mouth every 6 (six) hours as needed for pain.  . pravastatin (PRAVACHOL) 20 MG tablet Take 20 mg by mouth every morning.   . promethazine (PHENERGAN) 25 MG tablet Take 1 tablet by mouth every 6 (six) hours as needed.  . sulfamethoxazole-trimethoprim (BACTRIM DS,SEPTRA DS) 800-160 MG tablet Take 1 tablet by mouth 2 (two) times daily.  Marland Kitchen. venlafaxine XR (EFFEXOR-XR) 75 MG 24 hr capsule Take 150 mg every morning  by mouth.     FAMILY HISTORY:  Her indicated that her mother is deceased. She indicated that her father is deceased. She indicated that her sister is deceased. She indicated that the status of her neg hx is unknown.   SOCIAL HISTORY: She  reports that she has been smoking e-cigarettes.  She started smoking about  42 years ago. She has been smoking about 1.00 pack per day. she has never used smokeless tobacco. She reports that she does not drink alcohol or use drugs.  REVIEW OF SYSTEMS:   Could not be obtained due to altered mental status   VITAL SIGNS: BP (!) 145/85   Pulse (!) 116   Temp (!) 103.8 F (39.9 C) (Rectal)   Resp (!) 40   Ht 5\' 4"  (1.626 m)   Wt 65.7 kg (144 lb 13.5 oz)   SpO2 97%   BMI 24.86 kg/m   HEMODYNAMICS:  stable     INTAKE / OUTPUT: No intake/output data recorded.  PHYSICAL EXAMINATION: General:  On nasal canula, aphasic following commands occasionally. Not in distress Neuro: left pupil 5 m sluggish, right 3mm following commands occasionally aphasic  HEENT:  moist Cardiovascular:  Normal heart sounds no murmurs Lungs:  Clear equal air sounds no wheezing or crackles  Abdomen:  Soft no tenderness Musculoskeletal:  No edema Skin:  Mottled skin over her chest   LABS:  BMET No results for input(s): NA, K, CL, CO2, BUN, CREATININE, GLUCOSE in the last 168 hours.  Electrolytes No results for input(s): CALCIUM, MG, PHOS in the last 168 hours.  CBC Recent Labs  Lab 12/26/2016 2035 10/01/17 0333  WBC 7.0 6.5  HGB 16.0* 13.0  HCT 45.2 37.4  PLT 83* 77*    Coag's Recent Labs  Lab 12/26/2016 2104  APTT 33  INR 1.08    Sepsis Markers Recent Labs  Lab 12/26/2016 2035  LATICACIDVEN 6.5*    ABG No results for input(s): PHART, PCO2ART, PO2ART in the last 168 hours.  Liver Enzymes No results for input(s): AST, ALT, ALKPHOS, BILITOT, ALBUMIN in the last 168 hours.  Cardiac Enzymes No results for input(s): TROPONINI, PROBNP in the last 168 hours.  Glucose Recent Labs  Lab 12/26/2016 2104 10/01/17 0118  GLUCAP 148* 129*    Imaging Dg Chest Port 1 View  Result Date: 10/01/2017 CLINICAL DATA:  61 year old female with the tachypnea. EXAM: PORTABLE CHEST 1 VIEW COMPARISON:  Chest radiograph dated 07-08-2017 FINDINGS: Minimal bibasilar atelectatic  changes. There is no focal consolidation, pleural effusion, or pneumothorax. The cardiac silhouette is within normal limits. No acute osseous pathology. IMPRESSION: No active disease. Electronically Signed   By: Elgie CollardArash  Radparvar M.D.   On: 10/01/2017 03:07   Dg Chest Port 1 View  Result Date: 10/09/2017 CLINICAL DATA:  Altered mental status EXAM: PORTABLE CHEST 1 VIEW COMPARISON:  September 30, 2017 FINDINGS: There is no edema or consolidation. Heart is upper normal in size with pulmonary vascularity within normal limits. No adenopathy. No bone lesions. IMPRESSION: No edema or consolidation. Electronically Signed   By: Bretta BangWilliam  Woodruff III M.D.   On: 07-08-2017 20:20     STUDIES:  CT head from outside showing acute posterior to mid cerebellar infarct   CULTURES: Urine culture>>  ANTIBIOTICS: Meropenem vanco  SIGNIFICANT EVENTS: Right ureter stent 12/9   ASSESSMENT / PLAN:  Severe sepsis UTI Right hydronephrosis s/p right stent Acute CVA metabolic encephalopathy Thrombocytopenia AKI Hypokalemia Lactic acidosis  Plan: - Continue ABx - IVF  -  follow cultures -Lactic acid improving from 6.5--->3.3 - patient is protecting her airway and not in distress. She is on nasal canula with stable vitals. No indication for intubation at the moment. - follow repeated labs - hold anti HTN and give prns. Hold ACE.  - DVT prophylaxis - appreciate neurology input - appreciate urology follow up    FAMILY  - Updates: none available   - Inter-disciplinary family meet or Palliative Care meeting due by:  12/16    Pulmonary and Critical Care Medicine [facility] Pager: [phone]  10/01/2017, 4:13 AM

## 2017-10-01 NOTE — Progress Notes (Signed)
SLP Cancellation Note  Patient Details Name: Ashlee Snow MRN: [id number] DOB: 1955-11-19   Cancelled treatment:       Reason Eval/Treat Not Completed: Patient not medically ready. Per RN pt clinically worse, will hold eval, f/u next date.  Rondel BatonMary Beth Aryani Daffern, [place]MS, CCC-SLP Speech-Language Pathologist [phone]   Arlana LindauMary E Biddie Sebek 10/01/2017, 11:30 AM

## 2017-10-01 NOTE — Progress Notes (Signed)
Pt traveled to CT this am with 4LO2 able to follow commands, unable to cough on command, RR in 30s with sats 98%. BP 150-170s/90-100s.  Awaiting CCM for plan

## 2017-10-01 NOTE — Progress Notes (Signed)
eLink Physician-Brief Progress Note Patient Name: Ashlee ParaDebra A Snow DOB: [birth year] Ashlee Snow: [id number]   Date of Service  10/01/2017  HPI/Events of Note  Hypomag  eICU Interventions  Mag replaced     Intervention Category Intermediate Interventions: Electrolyte abnormality - evaluation and management  Arael Piccione 10/01/2017, 4:35 AM

## 2017-10-01 NOTE — Progress Notes (Signed)
Spoke with Dr Sharon SellerMcClung re blood cultures pending and elevated temperatures noted.  States ok to proceed with PICC placement at this time.

## 2017-10-01 NOTE — Anesthesia Postprocedure Evaluation (Signed)
Anesthesia Post Note  Patient: Ashlee Snow  Procedure(s) Performed: CYSTOSCOPY WITH STENT PLACEMENT (Right )     Patient location during evaluation: PACU Anesthesia Type: MAC Level of consciousness: confused Pain management: pain level controlled Vital Signs Assessment: post-procedure vital signs reviewed and stable Respiratory status: spontaneous breathing, respiratory function stable and patient connected to nasal cannula oxygen Cardiovascular status: stable and blood pressure returned to baseline Postop Assessment: no apparent nausea or vomiting Anesthetic complications: no    Last Vitals:  Vitals:   10/12/2017 2353 10/01/17 0000  BP: 126/77 (P) 131/82  Pulse: (!) 116 (!) (P) 116  Resp: (!) 36 (!) (P) 36  Temp: 36.5 C   SpO2: 97%     Last Pain:  Vitals:   10/14/2017 2125  TempSrc: Rectal                 Tiajuana Amass

## 2017-10-01 NOTE — Op Note (Signed)
PATIENT:  Ashlee Snow  Preoperative diagnosis:  1. Sepsis of urinary origin  2. Right hydroureteronephrosis   Postoperative diagnosis:  1. Same   Procedure:  1. Cystoscopy 2. Right ureteral stent placement (176fr x 24cm JJ)  3. RIght retrograde pyelography with interpretation 4. Insertion of foley catheter , simple    Surgeon: Retta Dionesahlstedt, M.D.  Resident: Al CorpusNarang, MD  Anesthesia: General  Complications: None  EBL: Minimal  Specimens: None  Indication: Ashlee ParaDebra A Henry is a 61 y.o. female with AMS and sepsis with CT revealing Right hydronephrosis and U/a concerning for infection. After reviewing the management options for treatment, they have elected to proceed with the above surgical procedure(s). We have discussed the potential benefits and risks of the procedure, side effects of the proposed treatment, the likelihood of the patient achieving the goals of the procedure, and any potential problems that might occur during the procedure or recuperation. Informed consent has been obtained.  Findings:   Normal bladder, catheter trauma on posterior aspect   Uncomplicated placement of 296fr x 24cm JJ ureteral stent without dangler   Retrograde pyelogram interpretation: Hydronephrosis with calyceal fullness and ureteral dilation to level of iliacs, delicate decompressed ureter distal to iliacs. No evidence of filling defect or extravasation   Post procedure KUB showed curl in region of renal pelvis and in bladder   Description of procedure:   The patient was taken to the operating room and general anesthesia was induced.  The patient was placed in the dorsal lithotomy position, prepped and draped in the usual sterile fashion, and preoperative antibiotics were administered. A preoperative time-out was performed.   Cystourethroscopy was performed.  The patient's urethra was examined and was normal. The bladder was then systematically examined in its entirety. There was no evidence for any  bladder tumors, stones, or other mucosal pathology.    Attention then turned to the right ureteral orifice and a ureteral catheter was used to intubate the ureteral orifice.  Omnipaque contrast was injected through the ureteral catheter and a retrograde pyelogram which revealed the above findings.  A Sensor guidewire was then advanced up the right ureter into the renal pelvis under fluoroscopic guidance.  A ureteral stent was advance over the wire using Seldinger technique.  The stent was positioned appropriately under fluoroscopic and cystoscopic guidance.  The wire was then removed with an adequate stent curl noted in the renal pelvis as well as in the bladder.  The bladder was then emptied and the procedure ended. A foley catheter was placed. The patient appeared to tolerate the procedure well and without complications.  The patient was able to be awakened and transferred to the recovery unit in satisfactory condition.   I have participated in all aspects of the patients care and attended all of the above procedure. I agree with the above note.

## 2017-10-01 NOTE — Progress Notes (Signed)
PULMONARY / CRITICAL CARE MEDICINE   Name: Ashlee Snow MRN: [id number] DOB: [birth year]    ADMISSION DATE:  10/10/2017   CHIEF COMPLAINT: Fever and nausea  HISTORY OF PRESENT ILLNESS:   This is a 61 year old with a past medical history of fibromyalgia, hypertension, and hypothyroidism who for several days prior to admission had apparently been suffering from nausea and vomiting.  She subsequently had fevers and was evaluated at an outside hospital where she was found to have left hydronephrosis.  A ureteral stent was placed and she was treated for urosepsis with antibiotics and generous fluids.  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  There was some concern this morning that she was less responsive and repeat CT scan of the head was obtained which showed essentially no change in the cerebellum with the fourth ventricle it is still patent.  She has been started on 3% saline.  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  PAST SURGICAL HISTORY: She  has a past surgical history that includes Cholecystectomy (01/22/1997); Abdominal hysterectomy; Ovarian cyst surgery; aortogram w/ stenting (2007, 2011, Aug. 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  Allergies  Allergen Reactions  . Ativan [Lorazepam] Other (See Comments)    Patient's husband said related to being oversedated not true allergy    No  current facility-administered medications on file prior to encounter.    Current Outpatient Medications on File Prior to Encounter  Medication Sig  . BLACK COHOSH PO Take 3 capsules by mouth daily.   . ergocalciferol (VITAMIN D2) 50000 units capsule Take 50,000 Units by mouth once a week. Monday  . HM ASPIRIN 81 MG chewable tablet Chew 81 mg by mouth 2 (two) times daily.   Marland Kitchen. levothyroxine (SYNTHROID, LEVOTHROID) 75 MCG tablet Take 75 mcg by mouth daily before breakfast.  . LINZESS 145 MCG CAPS capsule Take 145 mcg by mouth daily.  Marland Kitchen. lisinopril (PRINIVIL,ZESTRIL) 20 MG tablet Take 20 mg by mouth every morning.   Marland Kitchen. LYRICA 150 MG capsule Take 150 mg by mouth 2 (two) times daily.  . methocarbamol (ROBAXIN) 750 MG tablet Take 750 mg by mouth 3 (three) times daily.   . metoprolol succinate (TOPROL-XL) 25 MG 24 hr tablet Take 25 mg by mouth every morning.  Marland Kitchen. oxyCODONE-acetaminophen (PERCOCET) 10-325 MG tablet Take 1 tablet by mouth every 6 (six) hours as needed for pain. (Patient taking differently: Take 1 tablet by mouth 4 (four) times daily. )  . pravastatin (PRAVACHOL) 20 MG tablet Take 20 mg by mouth every morning.   . promethazine (PHENERGAN) 25 MG tablet Take 1 tablet by mouth every 6 (six) hours as needed.  . venlafaxine XR (EFFEXOR-XR) 75 MG 24 hr capsule Take 150 mg every morning by mouth.     FAMILY HISTORY:  Her indicated that her mother is deceased. She indicated that her father is deceased. She indicated that her sister is deceased. She indicated that the status of her neg hx is unknown.   SOCIAL HISTORY: She  reports that she has been smoking e-cigarettes.  She started smoking about  42 years ago. She has been smoking about 1.00 pack per day. she has never used smokeless tobacco. She reports that she does not drink alcohol or use drugs.  REVIEW OF SYSTEMS:   Unobtainable  SUBJECTIVE:  Unobtainable  VITAL SIGNS: BP (!) 170/86   Pulse 100   Temp 100 F (37.8 C) (Axillary)    Resp (!) 34   Ht 5\' 4"  (1.626 m)   Wt 139 lb 12.4 oz (63.4 kg)   SpO2 98%   BMI 23.99 kg/m       INTAKE / OUTPUT: I/O last 3 completed shifts: In: 1560.4 [I.V.:510.4; IV Piggyback:1050] Out: 1516 [Urine:1515; Blood:1]  PHYSICAL EXAMINATION: General: She is somewhat lethargic but in no overt distress. Neuro: She can tell me where we are but not why.  Pupils are equal and EOMs are full without exaggerated nystagmus.  Moves all fours. Cardiovascular: S1 and S2 are regular without murmur rub or gallop. Lungs: Respirations are unlabored but she has lots of scattered rhonchi.  Air movement is symmetrical. Abdomen: Abdomen is soft without organomegaly masses tenderness guarding or rebound.  LABS:  BMET Recent Labs  Lab 10/01/17 0333 10/01/17 0806  NA 136 138  K 3.4*  --   CL 107  --   CO2 17*  --   BUN 24*  --   CREATININE 1.57*  --   GLUCOSE 153*  --     Electrolytes Recent Labs  Lab 10/01/17 0317 10/01/17 0333  CALCIUM  --  8.1*  MG 1.3*  --     CBC Recent Labs  Lab 10/01/2017 2035 10/01/17 0333  WBC 7.0 6.5  HGB 16.0* 13.0  HCT 45.2 37.4  PLT 83* 77*    Coag's Recent Labs  Lab 10/19/2017 2104  APTT 33  INR 1.08    Sepsis Markers Recent Labs  Lab 10/17/2017 2035 10/01/17 0317 10/01/17 0333 10/01/17 0420  LATICACIDVEN 6.5* 3.3*  --  3.1*  PROCALCITON  --   --  41.84  --     ABG No results for input(s): PHART, PCO2ART, PO2ART in the last 168 hours.  Liver Enzymes Recent Labs  Lab 10/01/17 0333  AST 183*  ALT 72*  ALKPHOS 50  BILITOT 0.6  ALBUMIN 2.7*    Cardiac Enzymes Recent Labs  Lab 10/01/17 0317 10/01/17 0420  TROPONINI 1.04* 1.07*    Glucose Recent Labs  Lab 10/14/2017 2104 10/01/17 0118 10/01/17 0431 10/01/17 0758 10/01/17 1139  GLUCAP 148* 129* 154* 119* 112*    Imaging Ct Head Wo Contrast  Result Date: 10/01/2017 CLINICAL DATA:  Acute mental status change with lethargy. EXAM: CT HEAD WITHOUT CONTRAST TECHNIQUE:  Contiguous axial images were obtained from the base of the skull through the vertex without intravenous contrast. COMPARISON:  09/27/2017 and multiple previous FINDINGS: Brain: Similar appearance of the acute infarction affecting the right cerebellar hemisphere, probably PICA distribution, with swelling and some mass-effect upon the fourth ventricle but no change in size of the lateral or third ventricles. No evidence of hemorrhage. Old right parietal cortical and subcortical infarction and old right posteromedial temporal and occipital infarction. This same. Old right white matter track lacunar infarction appears the same. Vascular: There is atherosclerotic calcification of the major vessels at the base of the brain. Skull: Normal Sinuses/Orbits: Clear/normal Other: None IMPRESSION: No significant change since yesterday. Acute infarction affecting the right cerebellum with swelling. Mass-effect upon the fourth ventricle but no change in size the lateral or third ventricles at this time. No  hemorrhage. 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 Electronically Signed   By: Paulina Fusi M.D.   On: 10/01/2017 07:45   Dg Chest Port 1 View  Result Date: 10/01/2017 CLINICAL DATA:  61 year old female with the tachypnea. EXAM: PORTABLE CHEST 1 VIEW COMPARISON:  Chest radiograph dated 10-04-2017 FINDINGS: Minimal bibasilar atelectatic changes. There is no focal consolidation, pleural effusion, or pneumothorax. The cardiac silhouette is within normal limits. No acute osseous pathology. IMPRESSION: No active disease. Electronically Signed   By: Elgie Collard M.D.   On: 10/01/2017 03:07   Dg Chest Port 1 View  Result Date: 10/04/2017 CLINICAL DATA:  Altered mental status EXAM: PORTABLE CHEST 1 VIEW COMPARISON:  2017/10/04 FINDINGS: There is no edema or consolidation. Heart is upper normal in size with pulmonary vascularity within normal limits. No adenopathy. No bone lesions. IMPRESSION: No  edema or consolidation. Electronically Signed   By: Bretta Bang III M.D.   On: 10/04/17 20:20       ANTIBIOTICS: Meropenem and vancomycin    DISCUSSION: This is a 61 year old with a history of hypertension, fibromyalgia, and prior CVA who was treated for a UTI and hydronephrosis with placement of a left ureteral stent.  She had persistent difficulties with nausea and vomiting and a head CT has shown a right cerebellar infarct.  ASSESSMENT / PLAN:  PULMONARY A: I am concerned by her current examination and suspect that she may have aspirated.  I will not intubate the patient so long as her airway is preserved and quite frankly I am anxious to preserve her neurological examination.  She is already on very adequate coverage for aspiration.  CARDIOVASCULAR A: We are probably more than 24 hours out from her CVA and I am going to begin to gently address her hypertension.  INFECTIOUS A: Urinary tract infection.  Lactic acidosis is clearing with her current therapy.  Our antibiotic coverage is likely vastly broader than required and I am awaiting culture results to make adjustments.   NEUROLOGIC A: He has been started on 3% saline to address edema related to a right cerebellar infarct.  She requires frequent neurological examinations and may require surgical intervention should she decline    Greater than 32 minutes was spent in the care of this patient today   Penny Pia, MD Pulmonary and Critical Care Medicine [facility] Pager: [phone]  10/01/2017, 11:56 AM

## 2017-10-01 NOTE — Transfer of Care (Signed)
Immediate Anesthesia Transfer of Care Note  Patient: Ashlee Snow  Procedure(s) Performed: CYSTOSCOPY WITH STENT PLACEMENT (Right )  Patient Location: PACU  Anesthesia Type:MAC  Level of Consciousness: awake and confused  Airway & Oxygen Therapy: Patient connected to nasal cannula oxygen  Post-op Assessment: Report given to RN and Post -op Vital signs reviewed and stable  Post vital signs: Reviewed and stable  Last Vitals:  Vitals:   10/08/2017 2245 09/28/2017 2353  BP:  126/77  Pulse: (!) 116 (!) 116  Resp: (!) 40 (!) 36  Temp:  36.5 C  SpO2: 99%     Last Pain:  Vitals:   10/11/2017 2125  TempSrc: Rectal         Complications: No apparent anesthesia complications

## 2017-10-01 NOTE — Progress Notes (Signed)
OT Cancellation    10/01/17 0800  OT Visit Information  Last OT Received On 10/01/17  Reason Eval/Treat Not Completed Patient at procedure or test/ unavailable;Patient not medically ready. Pt off the floor for imaging. Per RN, pt clinically worse and will hold OT eval until medically ready. Will return as schedule allows. Thank you.   Treysean Petruzzi MSOT, OTR/L Acute Rehab Pager: [phone] Office: [phone]

## 2017-10-02 ENCOUNTER — Inpatient Hospital Stay (HOSPITAL_COMMUNITY): Payer: BLUE CROSS/BLUE SHIELD

## 2017-10-02 DIAGNOSIS — I639 Cerebral infarction, unspecified: Secondary | ICD-10-CM

## 2017-10-02 DIAGNOSIS — R7881 Bacteremia: Secondary | ICD-10-CM

## 2017-10-02 DIAGNOSIS — N111 Chronic obstructive pyelonephritis: Secondary | ICD-10-CM

## 2017-10-02 DIAGNOSIS — A419 Sepsis, unspecified organism: Secondary | ICD-10-CM

## 2017-10-02 DIAGNOSIS — R6520 Severe sepsis without septic shock: Secondary | ICD-10-CM

## 2017-10-02 DIAGNOSIS — A4102 Sepsis due to Methicillin resistant Staphylococcus aureus: Secondary | ICD-10-CM

## 2017-10-02 DIAGNOSIS — B9561 Methicillin susceptible Staphylococcus aureus infection as the cause of diseases classified elsewhere: Secondary | ICD-10-CM

## 2017-10-02 LAB — BASIC METABOLIC PANEL
ANION GAP: 12 (ref 5–15)
BUN: 20 mg/dL (ref 6–20)
CHLORIDE: 121 mmol/L — AB (ref 101–111)
CO2: 18 mmol/L — AB (ref 22–32)
CREATININE: 1.3 mg/dL — AB (ref 0.44–1.00)
Calcium: 7.5 mg/dL — ABNORMAL LOW (ref 8.9–10.3)
GFR calc non Af Amer: 44 mL/min — ABNORMAL LOW (ref >60)
GFR, EST AFRICAN AMERICAN: 51 mL/min — AB (ref >60)
Glucose, Bld: 148 mg/dL — ABNORMAL HIGH (ref 65–99)
Potassium: 2.8 mmol/L — ABNORMAL LOW (ref 3.5–5.1)
Sodium: 151 mmol/L — ABNORMAL HIGH (ref 135–145)

## 2017-10-02 LAB — BLOOD CULTURE ID PANEL (REFLEXED)
Acinetobacter baumannii: NOT DETECTED
CANDIDA ALBICANS: NOT DETECTED
CANDIDA GLABRATA: NOT DETECTED
CANDIDA PARAPSILOSIS: NOT DETECTED
CANDIDA TROPICALIS: NOT DETECTED
Candida krusei: NOT DETECTED
ENTEROBACTERIACEAE SPECIES: NOT DETECTED
Enterobacter cloacae complex: NOT DETECTED
Enterococcus species: NOT DETECTED
Escherichia coli: NOT DETECTED
HAEMOPHILUS INFLUENZAE: NOT DETECTED
KLEBSIELLA OXYTOCA: NOT DETECTED
KLEBSIELLA PNEUMONIAE: NOT DETECTED
Listeria monocytogenes: NOT DETECTED
METHICILLIN RESISTANCE: NOT DETECTED
Neisseria meningitidis: NOT DETECTED
PROTEUS SPECIES: NOT DETECTED
Pseudomonas aeruginosa: NOT DETECTED
STAPHYLOCOCCUS SPECIES: DETECTED — AB
STREPTOCOCCUS PYOGENES: NOT DETECTED
Serratia marcescens: NOT DETECTED
Staphylococcus aureus (BCID): DETECTED — AB
Streptococcus agalactiae: NOT DETECTED
Streptococcus pneumoniae: NOT DETECTED
Streptococcus species: NOT DETECTED

## 2017-10-02 LAB — SODIUM
SODIUM: 151 mmol/L — AB (ref 135–145)
SODIUM: 154 mmol/L — AB (ref 135–145)
SODIUM: 156 mmol/L — AB (ref 135–145)
Sodium: 147 mmol/L — ABNORMAL HIGH (ref 135–145)

## 2017-10-02 LAB — LACTIC ACID, PLASMA
LACTIC ACID, VENOUS: 1.6 mmol/L (ref 0.5–1.9)
LACTIC ACID, VENOUS: 1.4 mmol/L (ref 0.5–1.9)

## 2017-10-02 LAB — CBC WITH DIFFERENTIAL/PLATELET
BASOS PCT: 0 %
Basophils Absolute: 0 10*3/uL (ref 0.0–0.1)
Eosinophils Absolute: 0 10*3/uL (ref 0.0–0.7)
Eosinophils Relative: 0 %
HEMATOCRIT: 30.7 % — AB (ref 36.0–46.0)
HEMOGLOBIN: 10.2 g/dL — AB (ref 12.0–15.0)
LYMPHS PCT: 16 %
Lymphs Abs: 0.9 10*3/uL (ref 0.7–4.0)
MCH: 29.1 pg (ref 26.0–34.0)
MCHC: 33.2 g/dL (ref 30.0–36.0)
MCV: 87.5 fL (ref 78.0–100.0)
MONOS PCT: 12 %
Monocytes Absolute: 0.7 10*3/uL (ref 0.1–1.0)
NEUTROS ABS: 3.8 10*3/uL (ref 1.7–7.7)
Neutrophils Relative %: 72 %
Platelets: 85 10*3/uL — ABNORMAL LOW (ref 150–400)
RBC: 3.51 MIL/uL — ABNORMAL LOW (ref 3.87–5.11)
RDW: 16.4 % — ABNORMAL HIGH (ref 11.5–15.5)
WBC: 5.3 10*3/uL (ref 4.0–10.5)

## 2017-10-02 LAB — GLUCOSE, CAPILLARY
GLUCOSE-CAPILLARY: 143 mg/dL — AB (ref 65–99)
GLUCOSE-CAPILLARY: 130 mg/dL — AB (ref 65–99)
Glucose-Capillary: 132 mg/dL — ABNORMAL HIGH (ref 65–99)
Glucose-Capillary: 115 mg/dL — ABNORMAL HIGH (ref 65–99)
Glucose-Capillary: 127 mg/dL — ABNORMAL HIGH (ref 65–99)

## 2017-10-02 LAB — POTASSIUM: POTASSIUM: 3 mmol/L — AB (ref 3.5–5.1)

## 2017-10-02 LAB — MAGNESIUM: Magnesium: 2.2 mg/dL (ref 1.7–2.4)

## 2017-10-02 MED ORDER — POTASSIUM CHLORIDE 10 MEQ/50ML IV SOLN
10.0000 meq | INTRAVENOUS | Status: AC
  Administered 2017-10-02 (×5): 10 meq via INTRAVENOUS
  Filled 2017-10-02 (×5): qty 50

## 2017-10-02 MED ORDER — IPRATROPIUM-ALBUTEROL 0.5-2.5 (3) MG/3ML IN SOLN
3.0000 mL | Freq: Four times a day (QID) | RESPIRATORY_TRACT | Status: DC
  Administered 2017-10-02 – 2017-10-03 (×4): 3 mL via RESPIRATORY_TRACT
  Filled 2017-10-02 (×5): qty 3

## 2017-10-02 MED ORDER — LEVOFLOXACIN IN D5W 250 MG/50ML IV SOLN
250.0000 mg | INTRAVENOUS | Status: DC
  Administered 2017-10-02: 250 mg via INTRAVENOUS
  Filled 2017-10-02: qty 50

## 2017-10-02 MED ORDER — ALBUTEROL SULFATE (2.5 MG/3ML) 0.083% IN NEBU
2.5000 mg | INHALATION_SOLUTION | Freq: Four times a day (QID) | RESPIRATORY_TRACT | Status: DC | PRN
  Administered 2017-10-03: 2.5 mg via RESPIRATORY_TRACT

## 2017-10-02 MED ORDER — ALBUTEROL SULFATE (2.5 MG/3ML) 0.083% IN NEBU
2.5000 mg | INHALATION_SOLUTION | Freq: Four times a day (QID) | RESPIRATORY_TRACT | Status: DC
  Filled 2017-10-02: qty 3

## 2017-10-02 MED ORDER — NAFCILLIN SODIUM 2 G IJ SOLR
2.0000 g | INTRAVENOUS | Status: DC
  Administered 2017-10-02 – 2017-10-04 (×11): 2 g via INTRAVENOUS
  Filled 2017-10-02 (×13): qty 2000

## 2017-10-02 MED ORDER — SODIUM CHLORIDE 3 % IV SOLN
INTRAVENOUS | Status: DC
  Administered 2017-10-02 – 2017-10-03 (×2): 50 mL/h via INTRAVENOUS
  Filled 2017-10-02 (×8): qty 500

## 2017-10-02 MED ORDER — SODIUM CHLORIDE 3 % IV SOLN
INTRAVENOUS | Status: DC
  Administered 2017-10-02: 50 mL/h via INTRAVENOUS
  Filled 2017-10-02: qty 500

## 2017-10-02 MED ORDER — ENALAPRILAT 1.25 MG/ML IV SOLN
0.6250 mg | Freq: Four times a day (QID) | INTRAVENOUS | Status: DC
  Administered 2017-10-02 – 2017-10-04 (×8): 0.625 mg via INTRAVENOUS
  Filled 2017-10-02 (×8): qty 1

## 2017-10-02 NOTE — Progress Notes (Signed)
EEG Completed; Results Pending  

## 2017-10-02 NOTE — Evaluation (Signed)
Clinical/Bedside Swallow Evaluation Patient Details  Name: Ashlee Snow MRN: [id number] Date of Birth: [birth year]  Today's Date: 10/02/2017 Time: SLP Start Time (ACUTE ONLY): 1110 SLP Stop Time (ACUTE ONLY): 1123 SLP Time Calculation (min) (ACUTE ONLY): 13 min  Past Medical History:  Past Medical History:  Diagnosis Date  . Asthma   . Bronchitis   . DDD (degenerative disc disease)   . Depression   . Dyspnea    on exertion  . Fibromyalgia   . GERD (gastroesophageal reflux disease)    hx. of   . History of depression   . Hypertension   . Hypothyroidism   . Leg pain   . Migraine   . Peripheral arterial disease (HCC)   . Peripheral vascular disease (HCC)   . Sciatica   . 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 Thyroid disease   . 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 Ulcer    Past Surgical History:  Past Surgical History:  Procedure Laterality Date  . ABDOMINAL HYSTERECTOMY    . AORTA - BILATERAL FEMORAL ARTERY BYPASS GRAFT Bilateral 04/14/2017   Procedure: AORTA BIFEMORAL BYPASS GRAFT WITH HEMASHIELD GOLD BIFURCATED 14MMX7 MM GRAFT;  Surgeon: Nada LibmanBrabham, Vance W, MD;  Location: [facility] OR;  Service: Vascular;  Laterality: Bilateral;  . aortogram w/ stenting  2007, 2011, Aug. 2012  . BACK SURGERY  2016   lumbar surgery  . CHOLECYSTECTOMY  01/22/1997   Gall Bladder  . COLON SURGERY  1982   exploratory surgery  . COLONOSCOPY WITH ESOPHAGOGASTRODUODENOSCOPY (EGD)  2010   Dr. Jena Gaussourk: EGD with small hiatal hernia, small bowel biopsy negative, colonoscopy with hyperplastic polyp   . MANDIBLE FRACTURE SURGERY     TMJ surgery - right side  . OVARIAN CYST SURGERY    . TUBAL LIGATION     HPI:  This is a 61 year old with a past medical history of fibromyalgia, hypertension, and hypothyroidism who for several days prior to admission had apparently been suffering from  Confusion, fevers, N/V, right flank pain,. Found to have Severe sepsis secondary  to pyelonephritis with ureteral obstruction and MRI: Acute/subacute inferior right cerebellum and vermis infarct with associated hemorrhage. Additional scattered punctate foci of acute nonhemorrhagic infarct involving the right PCA territory with 1 lesion involving the right MCA territory along the right middle frontal gyrus. Multiple foci of remote encephalomalacia involving the inferior posterior right temporal lobe, lateral right temporal lobe, and high right parietal lobe   Assessment / Plan / Recommendation Clinical Impression  Pt demonstrates signs of poor airway protection with secretions and PO, with high risk of aspiration.  Oral function appears WNL, but pt is unable to produce a strong enough cough to expectorate audible upper airway secretions and could not trigger a swallow with verbal command. Provided one ice chip to determine if pt could elicit a swallow with positive result, though again pts inability to clear secretions preclude further trials or intake. Pt frowned upon hearing this and ask very softly for more ice and what my job was. Respiratory strength and endurance will need to improve prior to significant assessment with PO; pt is very fragile. Will follow for readiness.  SLP Visit Diagnosis: Dysphagia, oropharyngeal phase (R13.12)    Aspiration Risk  Severe aspiration risk;Risk for inadequate nutrition/hydration    Diet Recommendation NPO        Other  Recommendations Oral Care Recommendations: Oral care QID   Follow up Recommendations Inpatient Rehab  Frequency and Duration min 2x/week  2 weeks       Prognosis        Swallow Study   General HPI: This is a 61 year old with a past medical history of fibromyalgia, hypertension, and hypothyroidism who for several days prior to admission had apparently been suffering from  Confusion, fevers, N/V, right flank pain,. Found to have Severe sepsis secondary to pyelonephritis with ureteral obstruction and MRI:  Acute/subacute inferior right cerebellum and vermis infarct with associated hemorrhage. Additional scattered punctate foci of acute nonhemorrhagic infarct involving the right PCA territory with 1 lesion involving the right MCA territory along the right middle frontal gyrus. Multiple foci of remote encephalomalacia involving the inferior posterior right temporal lobe, lateral right temporal lobe, and high right parietal lobe Type of Study: Bedside Swallow Evaluation Previous Swallow Assessment: none Diet Prior to this Study: NPO Temperature Spikes Noted: No Respiratory Status: Nasal cannula History of Recent Intubation: No Behavior/Cognition: Alert;Lethargic/Drowsy;Requires cueing Oral Cavity Assessment: Within Functional Limits Oral Care Completed by SLP: Yes Oral Cavity - Dentition: Adequate natural dentition Vision: Impaired for self-feeding Self-Feeding Abilities: Total assist Patient Positioning: Upright in bed Baseline Vocal Quality: Wet Volitional Cough: Wet;Weak;Congested Volitional Swallow: Unable to elicit    Oral/Motor/Sensory Function Overall Oral Motor/Sensory Function: Within functional limits   Ice Chips Ice chips: Impaired Presentation: Spoon Pharyngeal Phase Impairments: Wet Vocal Quality;Cough - Immediate;Multiple swallows   Thin Liquid Thin Liquid: Not tested    Nectar Thick Nectar Thick Liquid: Not tested   Honey Thick Honey Thick Liquid: Not tested   Puree Puree: Not tested   Solid   GO   Solid: Not tested       Harlon DittyBonnie Delawrence Fridman, MA CCC-SLP [phone]  Ltanya Bayley, [name] 10/02/2017,1:48 PM

## 2017-10-02 NOTE — Evaluation (Signed)
Occupational Therapy Evaluation Patient Details Name: Ashlee Snow MRN: [id number] DOB: 07/19/1956 Today's Date: 10/02/2017    History of Present Illness This is a 61 year old with a past medical history of fibromyalgia, hypertension, and hypothyroidism who for several days prior to admission had apparently been suffering from  Confusion, fevers, N/V, right flank pain,. Found to have Severe sepsis secondary to pyelonephritis with ureteral obstruction and MRI: Acute/subacute inferior right cerebellum and vermis infarct with associated hemorrhage. Additional scattered punctate foci of acute nonhemorrhagic infarct involving the right PCA territory with 1 lesion involving the right MCA territory along the right middle frontal gyrus. Multiple foci of remote encephalomalacia involving the inferior posterior right temporal lobe, lateral right temporal lobe, and high right parietal lobe   Clinical Impression   This 61 yo female admitted and underwent above presents to acute OT with right gaze preference, decreased attention to left side, decreased balance, intermittent decreased/delayed command following, left >right side deficits, increased tone/tightness of LUE all affecting her PLOF of being totally independent with basic ADLs. She will benefit from acute OT with follow up on CIR to work back towards PLOF.    Follow Up Recommendations  CIR;Supervision/Assistance - 24 hour    Equipment Recommendations  Other (comment)(TBD at next venue)    Recommendations for Other Services Rehab consult     Precautions / Restrictions Precautions Precautions: Fall Precaution Comments: RR from 20-40s Restrictions Weight Bearing Restrictions: No      Mobility Bed Mobility Overal bed mobility: Needs Assistance Bed Mobility: Rolling;Sidelying to Sit;Sit to Supine Rolling: Total assist;+2 for physical assistance Sidelying to sit: Total assist;+2 for physical assistance   Sit to supine: Total assist;+2 for  physical assistance         Balance Overall balance assessment: Needs assistance Sitting-balance support: No upper extremity supported;Feet supported Sitting balance-Leahy Scale: Zero Sitting balance - Comments: Pt not pushing but also not attempting to hold self up with either UE Postural control: Right lateral lean;Posterior lean                                 ADL either performed or assessed with clinical judgement   ADL                                         General ADL Comments: Basically total A at this time. Did attempt to move fingers to wash under nose once washcloth placed in hand and hand brought to under her nose     Vision Baseline Vision/History: Wears glasses Patient Visual Report: (unable to state) Additional Comments: Right gaze preference with periods of looking left. No blink to threat on left. Right beating nystagmus when eyes are to right            Pertinent Vitals/Pain Pain Assessment: Faces Faces Pain Scale: Hurts even more Pain Location: husband reports she has chronic low back pain (pt grimaced with rolling, coming up to sit, and while seated. As well as with initial movement of RUE and neck Pain Descriptors / Indicators: Grimacing Pain Intervention(s): Limited activity within patient's tolerance;Monitored during session;Repositioned     Hand Dominance Right(per husband)   Extremity/Trunk Assessment Upper Extremity Assessment Upper Extremity Assessment: RUE deficits/detail;LUE deficits/detail RUE Deficits / Details: Spontaneous movement and some on command (mainly from elbow distally); pain initially with arm movement but  then dissiapated with more PROM RUE Coordination: decreased fine motor;decreased gross motor LUE Deficits / Details: Spontaneous movement and some on command (mainly from elbow distally); mild tightness noted throughtout LUE Coordination: decreased fine motor;decreased gross motor   Lower  Extremity Assessment Lower Extremity Assessment: Defer to PT evaluation       Communication Communication Communication: Receptive difficulties;Expressive difficulties   Cognition Arousal/Alertness: Awake/alert Behavior During Therapy: Flat affect(she did smile a few times) Overall Cognitive Status: Impaired/Different from baseline Area of Impairment: Following commands;Safety/judgement;Problem solving;Attention;Awareness                   Current Attention Level: Sustained   Following Commands: Follows one step commands inconsistently(follows them 50% of time sometimes with delay) Safety/Judgement: Decreased awareness of safety;Decreased awareness of deficits Awareness: Intellectual Problem Solving: Slow processing;Decreased initiation;Difficulty sequencing;Requires verbal cues;Requires tactile cues General Comments: Pt with yes/no nods appropriately at least 75% of time              Home Living Family/patient expects to be discharged to:: Inpatient rehab                                        Prior Functioning/Environment Level of Independence: Independent with assistive device(s)        Comments: Husband reports she stayed in bed/slept 80% of day due to pain and pain meds for back; but she was able to totally take care of herself from a basic ADL standpoint and get her own food        OT Problem List: Decreased strength;Decreased range of motion;Impaired balance (sitting and/or standing);Decreased safety awareness;Decreased cognition;Decreased coordination;Impaired vision/perception;Decreased knowledge of use of DME or AE;Impaired sensation;Pain;Impaired UE functional use;Impaired tone      OT Treatment/Interventions: Self-care/ADL training;Balance training;Therapeutic activities;Neuromuscular education;Cognitive remediation/compensation;Visual/perceptual remediation/compensation;DME and/or AE instruction;Patient/family education    OT  Goals(Current goals can be found in the care plan section) Acute Rehab OT Goals Patient Stated Goal: husband "I would like for her to be able to walk out of her, but I also realize that probably is not going to happen" OT Goal Formulation: With family Time For Goal Achievement: 10/16/17 Potential to Achieve Goals: Good  OT Frequency: Min 3X/week   Barriers to D/C: Decreased caregiver support  husband works from 9am-1pm daily       Co-evaluation PT/OT/SLP Co-Evaluation/Treatment: Yes Reason for Co-Treatment: Complexity of the patient's impairments (multi-system involvement);For patient/therapist safety;To address functional/ADL transfers   OT goals addressed during session: ADL's and self-care;Strengthening/ROM      AM-PAC PT "6 Clicks" Daily Activity     Outcome Measure Help from another person eating meals?: Total Help from another person taking care of personal grooming?: Total Help from another person toileting, which includes using toliet, bedpan, or urinal?: Total Help from another person bathing (including washing, rinsing, drying)?: Total Help from another person to put on and taking off regular upper body clothing?: Total Help from another person to put on and taking off regular lower body clothing?: Total 6 Click Score: 6   End of Session Nurse Communication: (how she did following commands)  Activity Tolerance: Patient tolerated treatment well Patient left: in bed;with family/visitor present  OT Visit Diagnosis: Other abnormalities of gait and mobility (R26.89);Muscle weakness (generalized) (M62.81);Other symptoms and signs involving the nervous system (R29.898);Other symptoms and signs involving cognitive function;Cognitive communication deficit (R41.841);Hemiplegia and hemiparesis;Pain Symptoms and signs involving cognitive functions:  Cerebral infarction Hemiplegia - Right/Left: Left Hemiplegia - dominant/non-dominant: Non-Dominant Hemiplegia - caused by: Cerebral  infarction Pain - Right/Left: Right Pain - part of body: Arm(also low back)                Time: [id number] OT Time Calculation (min): 31 min Charges:  OT General Charges $OT Visit: 1 Visit OT Evaluation $OT Eval Moderate Complexity: [address] Mod Ignacia PalmaCathy Contrell [place], [place] [phone] 10/02/2017

## 2017-10-02 NOTE — Evaluation (Signed)
Speech Language Pathology Evaluation Patient Details Name: Ashlee Snow MRN: [id number] DOB: 17-Sep-1956 Today's Date: 10/02/2017 Time: 3244-01021110-1123 SLP Time Calculation (min) (ACUTE ONLY): 13 min  Problem List:  Patient Active Problem List   Diagnosis Date Noted  . MRSA bacteremia   . Encephalopathy acute   . Sepsis (HCC) 10/19/2017  . 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 AKI (acute kidney injury) (HCC) 10/15/2017  . Obstructive pyelonephritis 10/10/2017  . Thrombocytopenia (HCC) 10/10/2017  . Hypokalemia 10/18/2017  . Lactic acidosis 10/06/2017  . 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 Diarrhea 06/05/2016  . Elevated LFTs 05/27/2016  . Burning sensation of the foot 12/22/2014  . Pain in limb 12/22/2014  . Peripheral vascular disease, unspecified (HCC) 02/06/2012  . WEIGHT LOSS 07/02/2009  . ABDOMINAL PAIN 07/02/2009  . Personal history of other diseases of digestive system 07/01/2009   Past Medical History:  Past Medical History:  Diagnosis Date  . Asthma   . Bronchitis   . DDD (degenerative disc disease)   . Depression   . Dyspnea    on exertion  . Fibromyalgia   . GERD (gastroesophageal reflux disease)    hx. of   . History of depression   . Hypertension   . Hypothyroidism   . Leg pain   . Migraine   . Peripheral arterial disease (HCC)   . Peripheral vascular disease (HCC)   . Sciatica   . Stroke (cerebrum) (HCC) 10/17/2017  . Thyroid disease   . 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 Ulcer    Past Surgical History:  Past Surgical History:  Procedure Laterality Date  . ABDOMINAL HYSTERECTOMY    . AORTA - BILATERAL FEMORAL ARTERY BYPASS GRAFT Bilateral 04/14/2017   Procedure: AORTA BIFEMORAL BYPASS GRAFT WITH HEMASHIELD GOLD BIFURCATED 14MMX7 MM GRAFT;  Surgeon: Nada LibmanBrabham, Vance W, MD;  Location: [facility] OR;  Service: Vascular;  Laterality: Bilateral;  . aortogram w/ stenting  2007, 2011, Aug. 2012  . BACK  SURGERY  2016   lumbar surgery  . CHOLECYSTECTOMY  01/22/1997   Gall Bladder  . COLON SURGERY  1982   exploratory surgery  . COLONOSCOPY WITH ESOPHAGOGASTRODUODENOSCOPY (EGD)  2010   Dr. Jena Gaussourk: EGD with small hiatal hernia, small bowel biopsy negative, colonoscopy with hyperplastic polyp   . MANDIBLE FRACTURE SURGERY     TMJ surgery - right side  . OVARIAN CYST SURGERY    . TUBAL LIGATION     HPI:  This is a 61 year old with a past medical history of fibromyalgia, hypertension, and hypothyroidism who for several days prior to admission had apparently been suffering from  Confusion, fevers, N/V, right flank pain,. Found to have Severe sepsis secondary to pyelonephritis with ureteral obstruction and MRI: Acute/subacute inferior right cerebellum and vermis infarct with associated hemorrhage. Additional scattered punctate foci of acute nonhemorrhagic infarct involving the right PCA territory with 1 lesion involving the right MCA territory along the right middle frontal gyrus. Multiple foci of remote encephalomalacia involving the inferior posterior right temporal lobe, lateral right temporal lobe, and high right parietal lobe   Assessment / Plan / Recommendation Clinical Impression  Brief cognitive linguistic assessment given pts overt respiratory fatigue with effort. Pt demosntrates intermittent attention to speaker. With engaging verbal commands pt followed single step commands with 100% accuracy though verbal cues were needed to increase effort as movement was sometimes minimal. Pt would not particiapte in targeted language tasks (naming repetition, etc) but did verbalize phrase  and sentence length questions about PO intake with very low volume due to impaired respiratory support. Pt also nodded and shook head no consistently to basic biographical questions. Will continue efforts to facilitate functional communication. Recommend CIR at d/c.     SLP Assessment  SLP Recommendation/Assessment: Patient  needs continued Speech Lanaguage Pathology Services SLP Visit Diagnosis: Dysarthria and anarthria (R47.1);Attention and concentration deficit Attention and concentration deficit following: Cerebral infarction    Follow Up Recommendations  Inpatient Rehab    Frequency and Duration min 2x/week  2 weeks      SLP Evaluation Cognition  Overall Cognitive Status: Impaired/Different from baseline Arousal/Alertness: Lethargic Orientation Level: Oriented to person Attention: Focused Focused Attention: Impaired Focused Attention Impairment: Verbal basic;Functional basic       Comprehension  Auditory Comprehension Overall Auditory Comprehension: Impaired Yes/No Questions: Within Functional Limits Commands: Within Functional Limits(one step only tested) Interfering Components: Attention;Visual impairments Visual Recognition/Discrimination Discrimination: Not tested    Expression Expression Primary Mode of Expression: Verbal Verbal Expression Overall Verbal Expression: Other (comment) Initiation: Impaired(inconsistent) Automatic Speech: Social Response Level of Generative/Spontaneous Verbalization: Phrase;Sentence Repetition: Impaired Level of Impairment: Word level Naming: Impairment Interfering Components: Attention   Oral / Motor  Oral Motor/Sensory Function Overall Oral Motor/Sensory Function: Within functional limits Motor Speech Overall Motor Speech: Impaired Respiration: Impaired Level of Impairment: Word Phonation: Breathy;Low vocal intensity Resonance: Within functional limits Articulation: Within functional limitis Intelligibility: Intelligibility reduced Word: 25-49% accurate Phrase: 25-49% accurate Sentence: 25-49% accurate Conversation: Not tested Motor Planning: Witnin functional limits   GO                   [employer] CorporationBonnie Demeco Ducksworth, MA CCC-SLP [phone]  Claudine MoutonDeBlois, Yudit Modesitt Caroline 10/02/2017, 2:42 PM

## 2017-10-02 NOTE — Progress Notes (Addendum)
PHARMACY - PHYSICIAN COMMUNICATION CRITICAL VALUE ALERT - BLOOD CULTURE IDENTIFICATION (BCID)  Ashlee Snow is an 61 y.o. female [facility] with a chief complaint of fever and nausea.   Assessment:  [age] yoF with left hydronephrosis s/p urethral stent placement. Treated with broad spectrum abx for sepsis of urinary tract origin and now found to have presumptive MSSA bacteremia.   Name of physician (or Provider) Contacted: Juanita CraverGrey  Current antibiotics:  Meropenem and vancomycin   Changes to prescribed antibiotics recommended:  None- await ID input   Results for orders placed or performed during the hospital encounter of 09/29/2017  Blood Culture ID Panel (Reflexed) (Collected: 10/13/2017  8:40 PM)  Result Value Ref Range   Enterococcus species NOT DETECTED NOT DETECTED   Listeria monocytogenes NOT DETECTED NOT DETECTED   Staphylococcus species DETECTED (A) NOT DETECTED   Staphylococcus aureus DETECTED (A) NOT DETECTED   Methicillin resistance NOT DETECTED NOT DETECTED   Streptococcus species NOT DETECTED NOT DETECTED   Streptococcus agalactiae NOT DETECTED NOT DETECTED   Streptococcus pneumoniae NOT DETECTED NOT DETECTED   Streptococcus pyogenes NOT DETECTED NOT DETECTED   Acinetobacter baumannii NOT DETECTED NOT DETECTED   Enterobacteriaceae species NOT DETECTED NOT DETECTED   Enterobacter cloacae complex NOT DETECTED NOT DETECTED   Escherichia coli NOT DETECTED NOT DETECTED   Klebsiella oxytoca NOT DETECTED NOT DETECTED   Klebsiella pneumoniae NOT DETECTED NOT DETECTED   Proteus species NOT DETECTED NOT DETECTED   Serratia marcescens NOT DETECTED NOT DETECTED   Haemophilus influenzae NOT DETECTED NOT DETECTED   Neisseria meningitidis NOT DETECTED NOT DETECTED   Pseudomonas aeruginosa NOT DETECTED NOT DETECTED   Candida albicans NOT DETECTED NOT DETECTED   Candida glabrata NOT DETECTED NOT DETECTED   Candida krusei NOT DETECTED NOT DETECTED   Candida  parapsilosis NOT DETECTED NOT DETECTED   Candida tropicalis NOT DETECTED NOT DETECTED    Sheron NightingaleJames A Leanndra Pember 10/02/2017  12:01 PM

## 2017-10-02 NOTE — Progress Notes (Signed)
Initial Nutrition Assessment  INTERVENTION:   If TF is needed recommend: Osmolite 1.2 @ 55 ml/hr (1320 ml/day) 30 ml Prostat daily Provides: 1684 kcal, 88 grams protein, and 1070 ml free water.    NUTRITION DIAGNOSIS:   Inadequate oral intake related to inability to eat as evidenced by NPO status.  GOAL:   Patient will meet greater than or equal to 90% of their needs  MONITOR:   Diet advancement, I & O's  REASON FOR ASSESSMENT:   Malnutrition Screening Tool    ASSESSMENT:   Pt with PMH of fibromyalgia, HTN, hypothyroidism who was recent having N/V and treated for urosepsis. Pt was found to have R cerebellar infarction.   Per husband pt with wt loss but unsure of amount. Per husband pt does not eat very much, skipping a lot of meals. When she does want something to eat she will eat several servings at a time. He believes changes in appetite and wt loss are related to her being on several medications.   Pt failed swallow eval today, remains NPO.  Pt with worsening symptoms this am, stat CT ordered.  Medications reviewed and include: synthroid Labs reviewed: Na 151 (H), K+ 2.8 (L) CBG's: 132    NUTRITION - FOCUSED PHYSICAL EXAM:    Most Recent Value  Orbital Region  No depletion  Upper Arm Region  No depletion  Thoracic and Lumbar Region  No depletion  Buccal Region  No depletion  Temple Region  No depletion  Clavicle Bone Region  No depletion  Clavicle and Acromion Bone Region  No depletion  Scapular Bone Region  No depletion  Dorsal Hand  No depletion  Patellar Region  No depletion  Anterior Thigh Region  No depletion  Posterior Calf Region  No depletion  Edema (RD Assessment)  None  Hair  Reviewed  Eyes  Reviewed  Mouth  Reviewed  Skin  Reviewed  Nails  Reviewed       Diet Order:  Diet NPO time specified  EDUCATION NEEDS:   No education needs have been identified at this time  Skin:  Skin Assessment: Reviewed RN Assessment  Last BM:   12/8  Height:   Ht Readings from Last 1 Encounters:  06-01-17 5\' 4"  (1.626 m)    Weight:   Wt Readings from Last 1 Encounters:  10/02/17 141 lb 5 oz (64.1 kg)    Ideal Body Weight:  54.5 kg  BMI:  Body mass index is 24.26 kg/m.  Estimated Nutritional Needs:   Kcal:  1600-1800  Protein:  75-90 grams  Fluid:  >1.6 L/day  Kendell BaneHeather Marlowe Lawes RD, LDN, CNSC [phone] Pager [phone] After Hours Pager

## 2017-10-02 NOTE — Consult Note (Signed)
[facility]    Date of Admission:  10/17/2017     Total days of antibiotics 3  Day 3 of Meropenem Day 2 of vancomycin               Reason for Consult: MSSA Bacteremia   Referring Provider: CCM Primary Care Provider: Kirstie PeriShah, Ashish, MD   Assessment/Plan:  61 year old female with PMH of hypertension, CAD, and PAD found to have hydronephrosis and now s/p urethral stent along with a right cerebellar stroke with mass effect. Initially presenting with sepsis and started on meropenem and vancomycin along with fluid resuscitation. Blood cultures positive for MSSA bacteremia of unidentified source with concern for possible UTI or endocarditis. Does have history of back surgery and may need to evaluate for abscess or osteo as well. Continues with empiric therapy of meropenem and vancomycin.  Right Cerebellar Stroke with Mass Effect - Stroke following and currently on 3% saline. Question possible source with concern for endocarditis or septic emboli. Would recommend a TEE to rule out. Additional stroke work up per neurology.  MSSA Bacteremia - Recommend narrowing spectrum of antibiotics with changing vancomycin and meropenem to Nafcillin. Source remains a concern. Repeat blood cultures.   Hydronephrosis - S/P stent placement with foley catheter in place and draining dark amber urine. Urology following.   Principal Problem:   Severe sepsis (HCC) Active Problems:   Stroke (cerebrum) (HCC)   AKI (acute kidney injury) (HCC)   Obstructive pyelonephritis   Thrombocytopenia (HCC)   Hypokalemia   Lactic acidosis   Encephalopathy acute   .  stroke: mapping our early stages of recovery book   Does not apply Once  . albuterol  2.5 mg Nebulization Q6H  . Chlorhexidine Gluconate Cloth  6 each Topical Daily  . insulin aspart  0-9 Units Subcutaneous Q4H  . levothyroxine  37.5 mcg Intravenous Daily  . pantoprazole (PROTONIX) IV  40 mg Intravenous Q24H  . sodium  chloride flush  10-40 mL Intracatheter Q12H     HPI: Ashlee Snow is a 61 y.o. female with a PMH of hypertension, coronary artery disease, hypothyroidism, depression, and peripheral artery disease who presented to an outside emergency department with a chief complaint of confusion, right flank pain, and nausea/vomiting.  Symptoms started approximately a couple of weeks prior to presentation. Her last known well was 09/28/17 with an acute increase in symptoms. On arrival to the ED she was febrile with a temperature of 104, tachypnea at 30, tachycardia at 150 and wave abnormalities in the left lead. Significant blood work included a potassium of 3.1, creatinine 1.91 up from baseline of 0.85 and a CBC with leukocytosis of 13,700.  UA was positive for bacteria and nitrites.  Lactic acid was elevated 11.9.  Significant imaging included a CT of the abdomen and pelvis noting right-sided hydronephrosis and proximal right hydroureter without definite calculus but with concern for stricture/scar.  CT of the head with decreased attenuation throughout the posterior mid right cerebellum likely representing an acute infarction.  She was admitted to stepdown unit for ongoing evaluation and management of sepsis related to right-sided pyelonephritis and complicated by an acute stroke.  She received fluid resuscitation at the outside hospital with 2 g of Rocephin.  She was treated empirically with meropenem and vancomycin. She underwent a right ureteral stent placement on 12/9 secondary to the hydronephrosis with no tumors, stones or other mucosal pathology noted. She tolerated the procedure with no complications. .Marland Kitchen  Evaluated by neurology with stroke work up initiated. She is currently on 3% saline for edema.  Since arrival on 12/8 her temperature maximum was 103.8.  Most recent fever was in the last 24 hours at 101.  No leukocytosis or leukopenia.  Blood cultures on 12/8 positive for MSSA bacteremia. She had a PICC line placed  on 12/9.    Her husband provides the history as she is currently non-verbal which is different from her baseline prior to hospitalization. Believes she had a fever about 24 hours prior to presentation but has been complaining of back/flank pain for several weeks. Unsure if she was having any urinary symptoms. She did have back surgery about 1 year ago and denies any complications that he is aware of.   Review of Systems: Review of Systems  Unable to perform ROS: Acuity of condition     Past Medical History:  Diagnosis Date  . Asthma   . Bronchitis   . DDD (degenerative disc disease)   . Depression   . Dyspnea    on exertion  . Fibromyalgia   . GERD (gastroesophageal reflux disease)    hx. of   . History of depression   . Hypertension   . Hypothyroidism   . Leg pain   . Migraine   . Peripheral arterial disease (HCC)   . Peripheral vascular disease (HCC)   . Sciatica   . Stroke (cerebrum) (HCC) 10/23/2017  . Thyroid disease   . 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 Ulcer     Social History   Tobacco Use  . Smoking status: Current Every Day Smoker    Packs/day: 1.00    Types: E-cigarettes    Start date: 08/03/1975  . Smokeless tobacco: Never Used  . Tobacco comment: Vaper cigarettes "juice", 20mg .  Substance Use Topics  . Alcohol use: No  . Drug use: No    Family History  Problem Relation Age of Onset  . Heart disease Mother   . Cancer Mother        lung  . Arthritis Mother   . Diabetes Mother   . Stroke Mother   . Hypertension Mother   . Heart disease Father   . Arthritis Father   . Heart failure Father   . Hypertension Father   . Stroke Father   . Peripheral vascular disease Father   . Heart disease Sister   . Arthritis Sister   . Kidney disease Sister   . Stroke Sister   . Fibromyalgia Sister   . Colon cancer Neg Hx     Allergies  Allergen Reactions  . Ativan [Lorazepam] Other (See Comments)     Patient's husband said related to being oversedated not true allergy    OBJECTIVE: Blood pressure (!) 166/91, pulse 86, temperature 99.4 F (37.4 C), temperature source Axillary, resp. rate (!) 40, height 5\' 4"  (1.626 m), weight 141 lb 5 oz (64.1 kg), SpO2 93 %.  Physical Exam  Constitutional: No distress.  Cardiovascular: Normal rate, regular rhythm, normal heart sounds and intact distal pulses. Exam reveals no gallop and no friction rub.  No murmur heard. Pulmonary/Chest: Tachypnea noted. She has decreased breath sounds. She has no wheezes. She has rhonchi. She has no rales.  Mildly increased work of breathing.   Neurological:  She is alert and can follow some commands. Not able to move any extremities to command. Pupils are unequal which is consistent with previous findings.  Lab Results Lab Results  Component Value Date   WBC 5.3 10/02/2017   HGB 10.2 (L) 10/02/2017   HCT 30.7 (L) 10/02/2017   MCV 87.5 10/02/2017   PLT 85 (L) 10/02/2017    Lab Results  Component Value Date   CREATININE 1.30 (H) 10/02/2017   BUN 20 10/02/2017   NA 151 (H) 10/02/2017   NA 151 (H) 10/02/2017   K 2.8 (L) 10/02/2017   CL 121 (H) 10/02/2017   CO2 18 (L) 10/02/2017    Lab Results  Component Value Date   ALT 72 (H) 10/01/2017   AST 183 (H) 10/01/2017   ALKPHOS 50 10/01/2017   BILITOT 0.6 10/01/2017     Microbiology: Recent Results (from the past 240 hour(s))  MRSA PCR Screening     Status: None   Collection Time: 10/17/2017  7:52 PM  Result Value Ref Range Status   MRSA by PCR NEGATIVE NEGATIVE Final    Comment:        The GeneXpert MRSA Assay (FDA approved for NASAL specimens only), is one component of a comprehensive MRSA colonization surveillance program. It is not intended to diagnose MRSA infection nor to guide or monitor treatment for MRSA infections.   Culture, blood (x 2)     Status: None (Preliminary result)   Collection Time: 09/29/2017  8:35 PM  Result Value Ref  Range Status   Specimen Description BLOOD LEFT ANTECUBITAL  Final   Special Requests IN PEDIATRIC BOTTLE Blood Culture adequate volume  Final   Culture NO GROWTH < 24 HOURS  Final   Report Status PENDING  Incomplete  Culture, blood (x 2)     Status: None (Preliminary result)   Collection Time: 09/29/2017  8:40 PM  Result Value Ref Range Status   Specimen Description BLOOD LEFT ANTECUBITAL  Final   Special Requests IN PEDIATRIC BOTTLE Blood Culture adequate volume  Final   Culture  Setup Time   Final    GRAM POSITIVE COCCI IN CLUSTERS IN PEDIATRIC BOTTLE CRITICAL RESULT CALLED TO, READ BACK BY AND VERIFIED WITH: B MANCHERIL,PHARMD AT 0825 10/02/17 BY L BENFIELD    Culture GRAM POSITIVE COCCI  Final   Report Status PENDING  Incomplete  Blood Culture ID Panel (Reflexed)     Status: Abnormal   Collection Time: 09/24/2017  8:40 PM  Result Value Ref Range Status   Enterococcus species NOT DETECTED NOT DETECTED Final   Listeria monocytogenes NOT DETECTED NOT DETECTED Final   Staphylococcus species DETECTED (A) NOT DETECTED Final    Comment: CRITICAL RESULT CALLED TO, READ BACK BY AND VERIFIED WITH: B MANCHERIL,PHARMD AT 0825 10/02/17 BY L BENFIELD    Staphylococcus aureus DETECTED (A) NOT DETECTED Final    Comment: Methicillin (oxacillin) susceptible Staphylococcus aureus (MSSA). Preferred therapy is anti staphylococcal beta lactam antibiotic (Cefazolin or Nafcillin), unless clinically contraindicated. CRITICAL RESULT CALLED TO, READ BACK BY AND VERIFIED WITH: B MANCHERIL,PHARMD AT 0825 10/02/17 BY L BENFIELD    Methicillin resistance NOT DETECTED NOT DETECTED Final   Streptococcus species NOT DETECTED NOT DETECTED Final   Streptococcus agalactiae NOT DETECTED NOT DETECTED Final   Streptococcus pneumoniae NOT DETECTED NOT DETECTED Final   Streptococcus pyogenes NOT DETECTED NOT DETECTED Final   Acinetobacter baumannii NOT DETECTED NOT DETECTED Final   Enterobacteriaceae species NOT DETECTED  NOT DETECTED Final   Enterobacter cloacae complex NOT DETECTED NOT DETECTED Final   Escherichia coli NOT DETECTED NOT DETECTED Final   Klebsiella oxytoca NOT DETECTED NOT DETECTED Final  Klebsiella pneumoniae NOT DETECTED NOT DETECTED Final   Proteus species NOT DETECTED NOT DETECTED Final   Serratia marcescens NOT DETECTED NOT DETECTED Final   Haemophilus influenzae NOT DETECTED NOT DETECTED Final   Neisseria meningitidis NOT DETECTED NOT DETECTED Final   Pseudomonas aeruginosa NOT DETECTED NOT DETECTED Final   Candida albicans NOT DETECTED NOT DETECTED Final   Candida glabrata NOT DETECTED NOT DETECTED Final   Candida krusei NOT DETECTED NOT DETECTED Final   Candida parapsilosis NOT DETECTED NOT DETECTED Final   Candida tropicalis NOT DETECTED NOT DETECTED Final     Marcos EkeGreg Calone, NP [facility] [phone] Pager  10/02/2017  11:08 AM        [place] [facility] Staphylococcus aureus bacteremia   Staphylococcus aureus bacteremia (SAB) is associated with a high rate of complications and mortality.  Specific aspects of clinical management are critical to optimizing the outcome of patients with SAB.  Therefore, the [facility]) has initiated an intervention aimed at improving the management of SAB [facility].  To do so, Infectious Diseases physicians are providing an evidence-based consult for the management of all patients with SAB.     Yes No Comments  Perform follow-up blood cultures (even if the patient is afebrile) to ensure clearance of bacteremia [x]  []    Remove vascular catheter and obtain follow-up blood cultures after the removal of the catheter []  []    Perform echocardiography to evaluate for endocarditis (transthoracic ECHO is 40-50% sensitive, TEE is > 90% sensitive) [x]  []  Please keep in mind, that neither test can definitively EXCLUDE endocarditis, and  that should clinical suspicion remain high for endocarditis the patient should then still be treated with an "endocarditis" duration of therapy = 6 weeks  Consult electrophysiologist to evaluate implanted cardiac device (pacemaker, ICD) []  []    Ensure source control [x]  []  Have all abscesses been drained effectively? Have deep seeded infections (septic joints or osteomyelitis) had appropriate surgical debridement?  Investigate for "metastatic" sites of infection [x]  []  Does the patient have ANY symptom or physical exam finding that would suggest a deeper infection (back or neck pain that may be suggestive of vertebral osteomyelitis or epidural abscess, muscle pain that could be a symptom of pyomyositis)?  Keep in mind that for deep seeded infections MRI imaging with contrast is preferred rather than other often insensitive tests such as plain x-rays, especially early in a patient's presentation.  Change antibiotic therapy to __________________ [x]  []  Beta-lactam antibiotics are preferred for MSSA due to higher cure rates.   If on Vancomycin, goal trough should be 15 - 20 mcg/mL  Estimated duration of IV antibiotic therapy:   []  []  Consult case management for probably prolonged outpatient IV antibiotic therapy

## 2017-10-02 NOTE — Progress Notes (Signed)
STROKE TEAM PROGRESS NOTE  Admission History:  Ashlee Snow is an 61 y.o. female presenting from OSH with large subacute right cerebellar ischemic infarction with mass effect and pyelonephritis.   She presented to the emergency department for evaluation of confusion, right flank pain, and nausea with vomiting. Patient had reportedly been complaining of right flank pain for a couple weeks now, was otherwise well until the afternoon of 09/28/2017 when her pain increased fairly acutely and she developed nausea with nonbloody vomiting. She did not get out of bed the following day due to her illness and became quite confused by that evening.She had refused to go to the emergency department, but with her condition persisting today, her husband overrode her decision and she was taken to the ED.  [facility] [place]:Upon arrival to the ED, patient is found to befebrile to 40.3 degree C, saturating well on room air, tachypneic to 30, tachycardic to 150, and with stable blood pressure. EKG features a sinus tachycardia with rate 141 and nonspecific T wave abnormality in the lateral leads. Chest x-ray is negative for acute cardiopulmonary disease. CT abdomen and pelvis is notable for right-sided hydronephrosis and proximal right hydroureter without definite calculus, raising concern for stricture/scar. Lactic acid was elevated to 11.9 the patient was treated with 2 L of lactated Ringer's and 2 g of IV Rocephin in the ED. A CT head was obtained at the OSH revealing a large subacute right cerebellar infarction with mass effect.   SUBJECTIVE (INTERVAL HISTORY)  Husband is at the bedside. Patient is found laying in bed, . Nursing concerned patient is less interactive today. Repeat Head CT and EEG pending.   OBJECTIVE Lab Results: CBC:  Recent Labs  Lab 10/09/2017 2035 10/01/17 0333 10/02/17 0616  WBC 7.0 6.5 5.3  HGB 16.0* 13.0 10.2*  HCT 45.2 37.4 30.7*  MCV 87.4 84.4 87.5  PLT 83* 77* 85*    BMP: Recent Labs  Lab 10/01/17 0317  10/01/17 0333 10/01/17 0806 10/01/17 1730 10/01/17 1918 10/02/17 0036 10/02/17 0616  NA  --    < > 136 138 145 145 147* 151*  151*  K  --   --  3.4*  --   --   --   --  2.8*  CL  --   --  107  --   --   --   --  121*  CO2  --   --  17*  --   --   --   --  18*  GLUCOSE  --   --  153*  --   --   --   --  148*  BUN  --   --  24*  --   --   --   --  20  CREATININE  --   --  1.57*  --   --   --   --  1.30*  CALCIUM  --   --  8.1*  --   --   --   --  7.5*  MG 1.3*  --   --   --   --   --   --  2.2   < > = values in this interval not displayed.   Liver Function Tests:  Recent Labs  Lab 10/01/17 0333  AST 183*  ALT 72*  ALKPHOS 50  BILITOT 0.6  PROT 6.1*  ALBUMIN 2.7*   Recent Labs  Lab 10/01/17 0317  AMMONIA 36*   Cardiac Enzymes:  Recent Labs  Lab 10/01/17 0317 10/01/17 0420 10/01/17 1730  TROPONINI 1.04* 1.07* 0.58*   Coagulation Studies:  Recent Labs    2017/10/19 2104  APTT 33  INR 1.08   PHYSICAL EXAM Temp:  [99.3 F (37.4 C)-101 F (38.3 C)] 99.4 F (37.4 C) (12/10 0802) Pulse Rate:  [84-107] 89 (12/10 1300) Resp:  [27-43] 30 (12/10 1300) BP: (139-194)/(73-129) 171/90 (12/10 1300) SpO2:  [92 %-98 %] 93 % (12/10 1300) Weight:  [64.1 kg (141 lb 5 oz)] 64.1 kg (141 lb 5 oz) (12/10 0500) General - Well nourished, well developed,  Respiratory - Tachypneic. . Cardiovascular - Regular rate and rhythm   Neurologic Examination: Mental Status: Awake. Impaired attention. Speech output is minimal and hypophonic, dysarthric, fragmentary and barely intelligible. Able to say "water". Not oriented.  Abulic. Fatigued-appearing. Does not fixate well on visual stimuli.  Cranial Nerves: II:  No blink to threat on the left. Intact blink to threat on the right. Right pupil 4 mm and reactive. Left pupil 6 mm and sluggishly reactive.  III,IV, VI: Saccadic dysmetria right greater than left but smooth pursuits bilaterally. Crosses  midline bilaterally with conjugate EOM, but will not gaze fully to left or right. No nystagmus.  V,VII: No definite facial droop. Reacts to tactile stimulation bilaterally.  VIII: Hearing intact to voice IX,X: Hypophonic speech.   XI: Head at midline XII: midline tongue extension  Motor/Sensory: RUE: Biceps, triceps, deltoid and grip 2-3/5 LUE: Biceps, triceps, deltoid and grip 2-3/5 RLE:   Withdraws from noxious with 3-4/5 strength.  LLE:   Withdraws from noxious with 2/5 strength.  Deep Tendon Reflexes:  Brisk, low-amplitude 3+ reflexes throughout. Toes equivocal bilaterally.  Cerebellar: unable to perform  Gait: not tested  IMAGING: I have personally reviewed the radiological images below and agree with the radiology interpretations. Ct Head Wo Contrast Result Date: 10/01/2017 IMPRESSION: No significant change since yesterday. Acute infarction affecting the right cerebellum with swelling. Mass-effect upon the fourth ventricle but no change in size the lateral or third ventricles at this time. No hemorrhage. 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 Electronically Signed   By: Paulina Fusi M.D.   On: 10/01/2017 07:45   MRI/MRA Brain Wo Contrast Result Date: 10/01/2017 IMPRESSION: 1. Acute/subacute inferior right cerebellum and vermis infarct with associated hemorrhage. 2. Additional scattered punctate foci of acute nonhemorrhagic infarct involving the right PCA territory with 1 lesion involving the right MCA territory along the right middle frontal gyrus. 3. Multiple foci of remote encephalomalacia involving the inferior posterior right temporal lobe, lateral right temporal lobe, and high right parietal lobe. 4. 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 The right A1 and likely M1 segments fill via a patent anterior communicating artery. 5. Fenestration of the vertebrobasilar junction without significant basilar artery stenosis. 6.  Marked attenuation of PCA branch vessels, worse on the right. Moderate proximal right PCA stenosis. 7. Fetal type left posterior cerebral artery.   Echocardiogram:  Study Conclusions - Left ventricle: The cavity size was normal. Wall thickness was   increased in a pattern of mild LVH. Systolic function was normal.   The estimated ejection fraction was in the range of 60% to 65%.   Wall motion was normal; there were no regional wall motion   abnormalities. Doppler parameters are consistent with abnormal   left ventricular relaxation (grade 1 diastolic dysfunction). - Mitral valve: There was mild regurgitation.  B/L Carotid U/S:  Right ECA and ICA waveforms are very similar. Unable to interrogate stenosis.  40-59% left ICA stenosis. Bilateral vertebral artery flow is antegrade.   CT Head:                                                           PENDING TEE:                                                                   PENDING EEG:                                                                   PENDING _____________________________________________________________________ ASSESSMENT: Ms. Leane ParaDebra A Stutsman is a 61 y.o. female with PMH of prior hypertension, coronary artery disease, hypothyroidism, chronic pain, depression, and peripheral arterial disease status post aorto-iliac bypass in June.   Acute, large Right cerebellar stroke with hemorrhage and mass effect Scattered punctate foci of acute nonhemorrhagic infarct -Right PCA territory Multiple foci of remote encephalomalacia - inferior posterior right temporal lobe, lateral right temporal lobe, and high right parietal lobe High-grade stenosis or occlusion of the cavernous right internal carotid artery.  Moderate proximal right PCA stenosis  Suspected Etiology:  Likely large vessel disease with high-grade terminal right ICA stenosis/occlusion and intracranial atherosclerosis  Resultant  Symptoms:  Stroke Risk Factors: hyperlipidemia, hypertension and CAD, PVD Other Stroke Risk Factors: Advanced age, Cigarette smoker, Migraines  Outstanding Stroke Work-up Studies: CT Head:                                                               PENDING TEE:                                                                      PENDING EEG:                                                                      PENDING  10/02/17: Neuro exam mostly unchanged. Less interactive per nursing. Repeat Head CT and EEG pending.  PLAN  10/02/2017: Continue 3% Na for now - Na 151 today HOLD ASA for now, due to  thrombocytopenia and risk of hemorrhagic conversion. TEE pending Neurosurgery consulted - no need for decomp surgery at this time. Appreciate recs Frequent neuro checks Telemetry monitoring Ongoing aggressive stroke risk factor management Follow up with [facility] in 6 weeks  Encephalopathy Likely multifactorial Will check EEG to r/o seizure activity  INTRACRANIAL Atherosclerosis &Stenosis: DAPT to be considered at discharge once patient improves  DYSPHAGIA: NPO until passes SLP swallow evaluation  MULTIPLE MEDICAL ISSUES Management per CCM team  HYPERTENSION: Stable, some elevated B/P's noted overnight Permissive hypertension (OK if <220/120) for 24-48 hours post stroke and then gradually normalized within 5-7 days. Nicardipine drip, Labetolol PRN-Avoid SBP less than 130 Long term BP goal normotensive. May slowly restart home B/P medications after 48 hours Home Meds: Metoprolol, Lisinopril  HYPERLIPIDEMIA:    Component Value Date/Time   CHOL 147 10/01/2017 0420   TRIG 335 (H) 10/01/2017 0420   HDL 34 (L) 10/01/2017 0420   CHOLHDL 4.3 10/01/2017 0420   VLDL 67 (H) 10/01/2017 0420   LDLCALC 46 10/01/2017 0420  Home Meds:  Pravachol 20 mg LDL  goal < 70 HOLD Pravachol 20 mg daily for now, until LFT's improve Continue statin at discharge, if LFT's  improve  DIABETES: Lab Results  Component Value Date   HGBA1C 6.3 (H) 10/01/2017  HgbA1c goal < 7.0 Currently ZO:[id number] Continue CBG monitoring and SSI DM education when applicable   TOBACCO ABUSE Current smoker Smoking cessation counseling when applicable Nicotine patch PRN  Other Active Problems: Principal Problem:   Sepsis (HCC) Active Problems:   Stroke (cerebrum) (HCC)   AKI (acute kidney injury) (HCC)   Obstructive pyelonephritis   Thrombocytopenia (HCC)   Hypokalemia   Lactic acidosis   Encephalopathy acute   MRSA bacteremia  Hospital day # 2  VTE prophylaxis: SCD's  Diet : Diet NPO time specified   Prior Home Stroke Medications: aspirin 81 mg daily and Pravachol 20 mg  Discharge Stroke Meds: Please discharge patient on TBD   Disposition: 01-Home or Self Care Therapy Recs:  CIR Follow Recs:  Follow-up Information    Micki Riley, MD. Schedule an appointment as soon as possible for a visit in 6 week(s).   Specialties:  Neurology, Radiology Contact information: [address] [place] [address] [phone]          Kirstie Peri, MD- PCP follow up in 1-2 weeks after discharge  FAMILY UPDATES: Husband at bedside  TEAM UPDATES: Nelda Bucks, MD  Brita Romp Stroke Neurology Team 10/02/2017 1:33 PM I have personally examined this patient, reviewed notes, independently viewed imaging studies, participated in medical decision making and plan of care.ROS completed by me personally and pertinent positives fully documented  I have made any additions or clarifications directly to the above note. Agree with note above. Patient has presented with multiple posterior circulation time right hemispheric infarcts likely due to significant intracranial and extracranial atherosclerosis. She also has significant history of peripheral vascular disease and has aortic stent grafting with possible infection. Check transesophageal  echocardiogram for endocarditis. Long discussion with her husband and Dr. Wallace Cullens at the bedside and answered questions. This patient is critically ill and at significant risk of neurological worsening, death and care requires constant monitoring of vital signs, hemodynamics,respiratory and cardiac monitoring, extensive review of multiple databases, frequent neurological assessment, discussion with family, other specialists and medical decision making of high complexity.I have made any additions or clarifications directly to the above note.This critical care time does  not reflect procedure time, or teaching time or supervisory time of PA/NP/Med Resident etc but could involve care discussion time.  I spent 30 minutes of neurocritical care time  in the care of  this patient.     Delia HeadyPramod Erine Phenix, MD Medical Director [facility] Pager: [phone] 10/02/2017 2:55 PM  To contact Stroke Continuity provider, please refer to [email or web address]. After hours, contact General Neurology

## 2017-10-02 NOTE — Progress Notes (Signed)
Pharmacy Antibiotic Note Ashlee Snow is a 61 y.o. female admitted on 10/20/2017 with sepsis.  [facility] (possibly due to UTI) and stroke.  CT imaging did show R hydronephrosis >> s/p emergent R ureteral stent placement per urology notes. Now with MSSA bacteremia, planning to change abx to nafcillin. SCr trending down 1.57 > 1.3   Plan: Nafcillin 2 g IV q4h Monitor cx, renal fx, length of therapy F/u TEE  Height: 5\' 4"  (162.6 cm) Weight: 141 lb 5 oz (64.1 kg) IBW/kg (Calculated) : 54.7  Temp (24hrs), Avg:100 F (37.8 C), Min:99.3 F (37.4 C), Max:101 F (38.3 C)  Recent Labs  Lab 10/14/2017 2035 10/01/17 0317 10/01/17 0333 10/01/17 0420 10/02/17 0616 10/02/17 0839 10/02/17 1137  WBC 7.0  --  6.5  --  5.3  --   --   CREATININE  --   --  1.57*  --  1.30*  --   --   LATICACIDVEN 6.5* 3.3*  --  3.1*  --  1.6 1.4    Estimated Creatinine Clearance: 40.2 mL/min (A) (by C-G formula based on SCr of 1.3 mg/dL (H)).    Allergies  Allergen Reactions  . Ativan [Lorazepam] Other (See Comments)    Patient's husband said related to being oversedated not true allergy    Antimicrobials this admission: Vancomycin 12/8 >> 12/10 Meropenem 12/8 >> 12/10 Nafcillin 12/10 >>  Dose adjustments this admission: N/A  Microbiology results: 12/8 BCx: MSSA 12/8 UCx: canceled 12/8 MRSA PCR: negative    Agapito GamesAlison Papa [place], PharmD, BCPS Clinical Pharmacist 10/02/2017 1:42 PM

## 2017-10-02 NOTE — Procedures (Addendum)
History: 61 year old female being evaluated for encephalopathy  Sedation: None  Technique: This is a 21 channel routine scalp EEG performed at the bedside with bipolar and monopolar montages arranged in accordance to the international 10/20 system of electrode placement. One channel was dedicated to EKG recording.    Background: The background consists of intermixed alpha and beta activities. There is a well defined posterior dominant rhythm of 8 Hz that attenuates with eye opening. Sleep isnot recorded.  There is prominent lateral eye movement artifact throughout the study.  Photic stimulation: Physiologic driving is not performed  EEG Abnormalities: None  Clinical Interpretation: This normal EEG is recorded in the waking state. There was no seizure or seizure predisposition recorded on this study. Please note that a normal EEG does not preclude the possibility of epilepsy.   [name] [name], MD [facility] [phone]  If 7pm- 7am, please page neurology on call as listed in AMION.

## 2017-10-02 NOTE — Evaluation (Signed)
Physical Therapy Evaluation Patient Details Name: Ashlee Snow MRN: [id number] DOB: September 25, 1956 Today's Date: 10/02/2017   History of Present Illness  This is a 60 year old with a past medical history of fibromyalgia, hypertension, and hypothyroidism who for several days prior to admission had apparently been suffering from  Confusion, fevers, N/V, right flank pain,. Found to have Severe sepsis secondary to pyelonephritis with ureteral obstruction and MRI: Acute/subacute inferior right cerebellum and vermis infarct with associated hemorrhage. Additional scattered punctate foci of acute nonhemorrhagic infarct involving the right PCA territory with 1 lesion involving the right MCA territory along the right middle frontal gyrus. Multiple foci of remote encephalomalacia involving the inferior posterior right temporal lobe, lateral right temporal lobe, and high right parietal lobe  Clinical Impression  Pt non-verbal but did smile and was able to appropriately shake head yes/no. Pt presenting with weakness x 4 extremities but L worse than R. Pt with no tracking to the L suggesting of L sided neglect. Pt was indep PTA but now requires maxAx2 for all mobility and is unable to amb at this time. Recommend CIR upon d/c for maximal functional recovery. Acute PT to con't to follow.    Follow Up Recommendations CIR    Equipment Recommendations  None recommended by PT(TBD)    Recommendations for Other Services Rehab consult     Precautions / Restrictions Precautions Precautions: Fall Precaution Comments: RR from 20-40s Restrictions Weight Bearing Restrictions: No      Mobility  Bed Mobility Overal bed mobility: Needs Assistance Bed Mobility: Rolling;Sidelying to Sit;Sit to Supine Rolling: Total assist;+2 for physical assistance Sidelying to sit: Total assist;+2 for physical assistance   Sit to supine: Total assist;+2 for physical assistance   General bed mobility comments: pt with no  initiation of transfer, unable to maintain upright position without maxA  Transfers                 General transfer comment: not appropriate today  Ambulation/Gait             General Gait Details: unable at this time  Stairs            Wheelchair Mobility    Modified Rankin (Stroke Patients Only) Modified Rankin (Stroke Patients Only) Pre-Morbid Rankin Score: No significant disability Modified Rankin: Severe disability     Balance Overall balance assessment: Needs assistance Sitting-balance support: No upper extremity supported;Feet supported Sitting balance-Leahy Scale: Zero Sitting balance - Comments: Pt not pushing but also not attempting to hold self up with either UE Postural control: Right lateral lean;Posterior lean                                   Pertinent Vitals/Pain Pain Assessment: Faces Faces Pain Scale: Hurts even more Pain Location: husband reports she has chronic low back pain (pt grimaced with rolling, coming up to sit, and while seated. As well as with initial movement of RUE and neck Pain Descriptors / Indicators: Grimacing Pain Intervention(s): Limited activity within patient's tolerance    Home Living Family/patient expects to be discharged to:: Inpatient rehab                 Additional Comments: pt was at home with spouse, indep without AD. was driving, performing ADLs, amb without AD, cooking, cleaning    Prior Function Level of Independence: Independent         Comments: Husband reports she stayed in  bed/slept 80% of day due to pain and pain meds for back; but she was able to totally take care of herself from a basic ADL standpoint and get her own food     Hand Dominance   Dominant Hand: Right    Extremity/Trunk Assessment   Upper Extremity Assessment Upper Extremity Assessment: Defer to OT evaluation RUE Deficits / Details: Spontaneous movement and some on command (mainly from elbow  distally); pain initially with arm movement but then dissiapated with more PROM RUE Coordination: decreased fine motor;decreased gross motor LUE Deficits / Details: Spontaneous movement and some on command (mainly from elbow distally); mild tightness noted throughtout LUE Coordination: decreased fine motor;decreased gross motor    Lower Extremity Assessment Lower Extremity Assessment: RLE deficits/detail;LLE deficits/detail RLE Deficits / Details: pt able to wiggle toes and mildly initiate mvmt however no gross movement noted, limited by impaired cognition LLE Deficits / Details: pt with minimal initiation but did demo trace movement. limited by impaired cognition       Communication   Communication: Receptive difficulties;Expressive difficulties  Cognition Arousal/Alertness: Awake/alert Behavior During Therapy: Flat affect Overall Cognitive Status: Impaired/Different from baseline Area of Impairment: Following commands;Safety/judgement;Awareness;Problem solving                   Current Attention Level: Sustained   Following Commands: Follows one step commands with increased time;Follows one step commands inconsistently(approx 50-60% of time) Safety/Judgement: Decreased awareness of safety;Decreased awareness of deficits(L sided neglect, R gaze preference) Awareness: Intellectual Problem Solving: Slow processing;Decreased initiation;Difficulty sequencing;Requires verbal cues;Requires tactile cues General Comments: Pt with yes/no nods appropriately at least 75% of time      General Comments      Exercises     Assessment/Plan    PT Assessment Patient needs continued PT services  PT Problem List Decreased strength;Decreased range of motion;Decreased activity tolerance;Decreased balance;Decreased mobility;Decreased coordination;Decreased cognition;Decreased knowledge of use of DME;Decreased safety awareness;Impaired sensation;Impaired tone       PT Treatment  Interventions DME instruction;Gait training;Stair training;Functional mobility training;Therapeutic activities;Therapeutic exercise;Balance training    PT Goals (Current goals can be found in the Care Plan section)  Acute Rehab PT Goals Patient Stated Goal: husband "I would like for her to be able to walk out of her, but I also realize that probably is not going to happen" PT Goal Formulation: With patient/family Time For Goal Achievement: 10/16/17 Potential to Achieve Goals: Good    Frequency Min 4X/week   Barriers to discharge Decreased caregiver support spouse works from 9-1pm M-F    Co-evaluation PT/OT/SLP Co-Evaluation/Treatment: Yes Reason for Co-Treatment: Complexity of the patient's impairments (multi-system involvement) PT goals addressed during session: Mobility/safety with mobility OT goals addressed during session: ADL's and self-care;Strengthening/ROM       AM-PAC PT "6 Clicks" Daily Activity  Outcome Measure Difficulty turning over in bed (including adjusting bedclothes, sheets and blankets)?: Unable Difficulty moving from lying on back to sitting on the side of the bed? : Unable Difficulty sitting down on and standing up from a chair with arms (e.g., wheelchair, bedside commode, etc,.)?: Unable Help needed moving to and from a bed to chair (including a wheelchair)?: Total Help needed walking in hospital room?: Total Help needed climbing 3-5 steps with a railing? : Total 6 Click Score: 6    End of Session Equipment Utilized During Treatment: Oxygen Activity Tolerance: Patient tolerated treatment well Patient left: in bed;with call bell/phone within reach;with family/visitor present Nurse Communication: Mobility status PT Visit Diagnosis: Unsteadiness on feet (R26.81);Other  symptoms and signs involving the nervous system (R29.898)    Time: [id number] PT Time Calculation (min) (ACUTE ONLY): 31 min   Charges:   PT Evaluation $PT Eval Moderate Complexity: 1  Mod     PT G Codes:        Lewis ShockAshly Neosha Switalski, PT, DPT Pager #: [phone] Office #: [phone]   Emmalea Treanor M Aspen Lawrance 10/02/2017, 10:35 AM

## 2017-10-02 NOTE — Progress Notes (Signed)
PULMONARY / CRITICAL CARE MEDICINE   Name: Ashlee Snow MRN: [id number] DOB: Oct 11, 1956    ADMISSION DATE:  10/12/2017   CHIEF COMPLAINT: Fever and nausea  HISTORY OF PRESENT ILLNESS:   This is a 61 year old with a past medical history of fibromyalgia, hypertension, and hypothyroidism who for several days prior to admission had apparently been suffering from nausea and vomiting.  She subsequently had fevers and was evaluated at an outside hospital where she was found to have left hydronephrosis.  A ureteral stent was placed and she was treated for urosepsis with antibiotics and generous fluids.  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   Today she is not as responsive for me.  He nods to questions but is entirely nonverbal.  She is not requiring pressors and is in fact is hypertensive.  We have no positive cultures back at this point.  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  PAST SURGICAL HISTORY: She  has a past surgical history that includes Cholecystectomy (01/22/1997); Abdominal hysterectomy; Ovarian cyst surgery; aortogram w/ stenting (2007, 2011, Aug. 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  Allergies  Allergen Reactions  . Ativan [Lorazepam] Other (See Comments)    Patient's husband said related to being oversedated not true allergy    No current facility-administered  medications on file prior to encounter.    Current Outpatient Medications on File Prior to Encounter  Medication Sig  . BLACK COHOSH PO Take 3 capsules by mouth daily.   . ergocalciferol (VITAMIN D2) 50000 units capsule Take 50,000 Units by mouth once a week. Monday  . HM ASPIRIN 81 MG chewable tablet Chew 81 mg by mouth 2 (two) times daily.   Marland Kitchen. levothyroxine (SYNTHROID, LEVOTHROID) 75 MCG tablet Take 75 mcg by mouth daily before breakfast.  . LINZESS 145 MCG CAPS capsule Take 145 mcg by mouth daily.  Marland Kitchen. lisinopril (PRINIVIL,ZESTRIL) 20 MG tablet Take 20 mg by mouth every morning.   Marland Kitchen. LYRICA 150 MG capsule Take 150 mg by mouth 2 (two) times daily.  . methocarbamol (ROBAXIN) 750 MG tablet Take 750 mg by mouth 3 (three) times daily.   . metoprolol succinate (TOPROL-XL) 25 MG 24 hr tablet Take 25 mg by mouth every morning.  Marland Kitchen. oxyCODONE-acetaminophen (PERCOCET) 10-325 MG tablet Take 1 tablet by mouth every 6 (six) hours as needed for pain. (Patient taking differently: Take 1 tablet by mouth 4 (four) times daily. )  . pravastatin (PRAVACHOL) 20 MG tablet Take 20 mg by mouth every morning.   . promethazine (PHENERGAN) 25 MG tablet Take 1 tablet by mouth every 6 (six) hours as needed.  . venlafaxine XR (EFFEXOR-XR) 75 MG 24 hr capsule Take 150 mg every morning by mouth.     FAMILY HISTORY:  Her indicated that her mother is deceased. She indicated that her father is deceased. She indicated that her sister is deceased. She indicated that the status of her neg hx is unknown.   SOCIAL HISTORY: She  reports that she has been smoking e-cigarettes.  She started smoking about 42 years ago. She  has been smoking about 1.00 pack per day. she has never used smokeless tobacco. She reports that she does not drink alcohol or use drugs.  REVIEW OF SYSTEMS:   Unobtainable  SUBJECTIVE:  Unobtainable  VITAL SIGNS: BP (!) 168/95   Pulse 89   Temp 99.4 F (37.4 C) (Axillary)   Resp (!) 40   Ht 5\' 4"  (1.626  m)   Wt 141 lb 5 oz (64.1 kg)   SpO2 95%   BMI 24.26 kg/m       INTAKE / OUTPUT: I/O last 3 completed shifts: In: 3712.9 [I.V.:2662.9; IV Piggyback:1050] Out: 3741 [Urine:3740; Blood:1]  PHYSICAL EXAMINATION: General: She is somewhat lethargic but in no overt distress. Neuro: She does not talk.  She nods to questions.  He has anisocoria with a 4 mm left pupil and 2 mm right pupil.  She does not have exaggerated nystagmus.  The face is symmetric.  Moves all fours but seems to be relatively weak on the left.  There is perseveration of motor movements. Cardiovascular: S1 and S2 are regular without murmur rub or gallop. Lungs: Respirations are unlabored fewer scattered rhonchi today.   Air movement is symmetrical. Abdomen: Abdomen is soft without organomegaly masses tenderness guarding or rebound.  LABS:  BMET Recent Labs  Lab 10/01/17 0333  10/01/17 1918 10/02/17 0036 10/02/17 0616  NA 136   < > 145 147* 151*  151*  K 3.4*  --   --   --  2.8*  CL 107  --   --   --  121*  CO2 17*  --   --   --  18*  BUN 24*  --   --   --  20  CREATININE 1.57*  --   --   --  1.30*  GLUCOSE 153*  --   --   --  148*   < > = values in this interval not displayed.    Electrolytes Recent Labs  Lab 10/01/17 0317 10/01/17 0333 10/02/17 0616  CALCIUM  --  8.1* 7.5*  MG 1.3*  --  2.2    CBC Recent Labs  Lab 10-Oct-2017 2035 10/01/17 0333 10/02/17 0616  WBC 7.0 6.5 5.3  HGB 16.0* 13.0 10.2*  HCT 45.2 37.4 30.7*  PLT 83* 77* 85*    Coag's Recent Labs  Lab 2017/10/10 2104  APTT 33  INR 1.08    Sepsis Markers Recent Labs  Lab 10-10-17 2035 10/01/17 0317 10/01/17 0333 10/01/17 0420  LATICACIDVEN 6.5* 3.3*  --  3.1*  PROCALCITON  --   --  41.84  --     ABG No results for input(s): PHART, PCO2ART, PO2ART in the last 168 hours.  Liver Enzymes Recent Labs  Lab 10/01/17 0333  AST 183*  ALT 72*  ALKPHOS 50  BILITOT 0.6  ALBUMIN 2.7*    Cardiac Enzymes Recent Labs   Lab 10/01/17 0317 10/01/17 0420 10/01/17 1730  TROPONINI 1.04* 1.07* 0.58*    Glucose Recent Labs  Lab 10/01/17 1139 10/01/17 1724 10/01/17 1934 10/01/17 2341 10/02/17 0415 10/02/17 0729  GLUCAP 112* 122* 115* 145* 143* 132*    Imaging Mr Brain Wo Contrast  Result Date: 10/01/2017 CLINICAL DATA:  Stroke, follow-up.  Seizure.  Tachycardia. EXAM: MRI HEAD WITHOUT CONTRAST MRA HEAD WITHOUT CONTRAST TECHNIQUE: Multiplanar, multiecho pulse sequences of the brain and surrounding structures were obtained without intravenous contrast. Angiographic images of the head were obtained using MRA technique without contrast. COMPARISON:  CT head without contrast  10/01/2017 at 7:28 a.m. FINDINGS: MRI HEAD FINDINGS Brain: Diffusion-weighted images confirm an acute/ subacute infarct involving the inferior medial right cerebellum. There is involvement of the inferior vermis. Additional scattered foci of restricted diffusion are present in the posterior right temporal lobe, right occipital lobe, posterior right parietal lobe. A single focus is present in the right middle frontal gyrus. In addition to the areas of acute infarction, more remote encephalomalacia is present along the inferior posterior right temporal lobe and in the anterior right lentiform nucleus. Remote encephalomalacia is present in the superior right parietal lobe near the vertex and in the lateral right temporal lobe. A remote left parietal cortical infarct is present. Blood products are associated with the acute inferior cerebellar and vermis infarct. Vascular: Flow is present in the major intracranial arteries. The cavernous right internal carotid artery and mid basilar artery are markedly diminished in size. Please see the MRA report below. Skull and upper cervical spine: Skullbase is within normal limits. The craniocervical junction is within normal limits. The sagittal T1 weighted images are degraded by patient motion. 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 The remaining paranasal sinuses and the mastoid air cells are clear. Globes and orbits are within normal limits bilaterally. MRA HEAD FINDINGS There is signal loss in the cavernous right internal carotid artery with markedly diminished signal in the high cervical right ICA. Normal signal is present in the left ICA with atherosclerotic irregularity in the cavernous internal carotid artery but no significant stenosis. Signal loss in the terminal left ICA is likely artifactual. The anterior communicating artery is patent. Flow in the right A1 segment is likely from the left. There is marked decreased attenuation of the distal left A2 segments. The M1 segments are poorly seen bilaterally. This may represent stenosis. There is some artifact at this level. There is attenuation of MCA branch vessels, right worse than left. The vertebral arteries are codominant. There is a fenestration at the vertebrobasilar junction. The basilar artery is intact. A fetal type left posterior cerebral artery is present. There is marked attenuation of the right posterior cerebral artery beginning with the P2 segment stenosis. IMPRESSION: 1. Acute/subacute inferior right cerebellum and vermis infarct with associated hemorrhage. 2. Additional scattered punctate foci of acute nonhemorrhagic infarct involving the right PCA territory with 1 lesion involving the right MCA territory along the right middle frontal gyrus. 3. Multiple foci of remote encephalomalacia involving the inferior posterior right temporal lobe, lateral right temporal lobe, and high right parietal lobe. 4. 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 The right A1 and likely M1 segments fill via a patent anterior communicating artery. 5. Fenestration of the vertebrobasilar junction without significant basilar artery stenosis. 6. Marked attenuation of PCA branch vessels, worse on the  right. Moderate proximal right PCA stenosis. 7. Fetal type left posterior cerebral artery. Electronically Signed   By: Marin Roberts M.D.   On: 10/01/2017 16:57   Dg Chest Port 1 View  Result Date: 10/02/2017 CLINICAL DATA:  Aspiration into the airways EXAM: PORTABLE CHEST 1 VIEW COMPARISON:  October 01, 2017 FINDINGS: The heart size and mediastinal contours are stable. Right PICC line is identified with distal tip in the superior vena cava. There is no pneumothorax. Patchy consolidation of left lung base is identified. There is no pulmonary edema or pleural effusion. The visualized skeletal structures are stable. IMPRESSION: Patchy consolidation of the left lung base suspicious for aspiration pneumonia. Electronically Signed   By:  Sherian Rein M.D.   On: 10/02/2017 07:40   Mr Maxine Glenn Head Wo Contrast  Result Date: 10/01/2017 CLINICAL DATA:  Stroke, follow-up.  Seizure.  Tachycardia. EXAM: MRI HEAD WITHOUT CONTRAST MRA HEAD WITHOUT CONTRAST TECHNIQUE: Multiplanar, multiecho pulse sequences of the brain and surrounding structures were obtained without intravenous contrast. Angiographic images of the head were obtained using MRA technique without contrast. COMPARISON:  CT head without contrast 10/01/2017 at 7:28 a.m. FINDINGS: MRI HEAD FINDINGS Brain: Diffusion-weighted images confirm an acute/ subacute infarct involving the inferior medial right cerebellum. There is involvement of the inferior vermis. Additional scattered foci of restricted diffusion are present in the posterior right temporal lobe, right occipital lobe, posterior right parietal lobe. A single focus is present in the right middle frontal gyrus. In addition to the areas of acute infarction, more remote encephalomalacia is present along the inferior posterior right temporal lobe and in the anterior right lentiform nucleus. Remote encephalomalacia is present in the superior right parietal lobe near the vertex and in the lateral right  temporal lobe. A remote left parietal cortical infarct is present. Blood products are associated with the acute inferior cerebellar and vermis infarct. Vascular: Flow is present in the major intracranial arteries. The cavernous right internal carotid artery and mid basilar artery are markedly diminished in size. Please see the MRA report below. Skull and upper cervical spine: Skullbase is within normal limits. The craniocervical junction is within normal limits. The sagittal T1 weighted images are degraded by patient motion. 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 The remaining paranasal sinuses and the mastoid air cells are clear. Globes and orbits are within normal limits bilaterally. MRA HEAD FINDINGS There is signal loss in the cavernous right internal carotid artery with markedly diminished signal in the high cervical right ICA. Normal signal is present in the left ICA with atherosclerotic irregularity in the cavernous internal carotid artery but no significant stenosis. Signal loss in the terminal left ICA is likely artifactual. The anterior communicating artery is patent. Flow in the right A1 segment is likely from the left. There is marked decreased attenuation of the distal left A2 segments. The M1 segments are poorly seen bilaterally. This may represent stenosis. There is some artifact at this level. There is attenuation of MCA branch vessels, right worse than left. The vertebral arteries are codominant. There is a fenestration at the vertebrobasilar junction. The basilar artery is intact. A fetal type left posterior cerebral artery is present. There is marked attenuation of the right posterior cerebral artery beginning with the P2 segment stenosis. IMPRESSION: 1. Acute/subacute inferior right cerebellum and vermis infarct with associated hemorrhage. 2. Additional scattered punctate foci of acute nonhemorrhagic infarct involving the right PCA territory with 1  lesion involving the right MCA territory along the right middle frontal gyrus. 3. Multiple foci of remote encephalomalacia involving the inferior posterior right temporal lobe, lateral right temporal lobe, and high right parietal lobe. 4. 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 The right A1 and likely M1 segments fill via a patent anterior communicating artery. 5. Fenestration of the vertebrobasilar junction without significant basilar artery stenosis. 6. Marked attenuation of PCA branch vessels, worse on the right. Moderate proximal right PCA stenosis. 7. Fetal type left posterior cerebral artery. Electronically Signed   By: Marin Roberts M.D.   On: 10/01/2017 16:57       ANTIBIOTICS: Meropenem and vancomycin    DISCUSSION: This is a 61 year old with a history  of hypertension, fibromyalgia, and prior CVA who was treated for a UTI and hydronephrosis with placement of a left ureteral stent.  She had persistent difficulties with nausea and vomiting and a head CT has shown a right cerebellar infarct.  ASSESSMENT / PLAN:  PULMONARY A: Based on her current exam my concerns about aspiration are somewhat abated.  She certainly adequately covered with her current antibiotic regimen.  CARDIOVASCULAR A: We are probably more than 48 hours out from her CVA and I am going to begin to gently increasing her antihypertensive regimen.    INFECTIOUS A: Urinary tract infection.  Lactic acidosis is clearing with her current therapy.  Our antibiotic coverage is likely vastly broader than required and I am awaiting culture results to make adjustments.   NEUROLOGIC A: She was  started on 3% saline to address edema related to a right cerebellar infarct.  Unfortunately I think her exam is worse this morning as she is now essentially mute and has anisocoria which was not previously present.  An emergent repeat CT scan has been ordered.     Greater than 32 minutes  was spent in the care of this patient today   Penny PiaWJ Amea Mcphail, MD Pulmonary and Critical Care Medicine [facility] Pager: [phone]  10/02/2017, 8:59 AM

## 2017-10-02 NOTE — Progress Notes (Signed)
VASCULAR LAB PRELIMINARY  PRELIMINARY  PRELIMINARY  PRELIMINARY  Carotid duplex completed.    Preliminary report:  Right ECA and ICA waveforms are very similar. Unable to interrogate stenosis.  40-59% left ICA stenosis. Bilateral vertebral artery flow is antegrade.   Keyuana Wank, RVT 10/02/2017, 12:24 PM

## 2017-10-02 NOTE — Progress Notes (Signed)
Rehab Admissions Coordinator Note:  Patient was screened by Clois DupesBoyette, Ryane Canavan Godwin for appropriateness for an Inpatient Acute Rehab Consult per OT recommendation  At this time, we are recommending wait further progress before requesting an inpt rehab conuslt. Noted repeat CT warranted this morning per Dr. Wallace CullensGray. I iwll follow.Clois Dupes.  Haruki Arnold Godwin 10/02/2017, 9:55 AM  I can be reached at [phone].

## 2017-10-03 DIAGNOSIS — G9340 Encephalopathy, unspecified: Secondary | ICD-10-CM

## 2017-10-03 LAB — GLUCOSE, CAPILLARY
GLUCOSE-CAPILLARY: 112 mg/dL — AB (ref 65–99)
Glucose-Capillary: 135 mg/dL — ABNORMAL HIGH (ref 65–99)
Glucose-Capillary: 125 mg/dL — ABNORMAL HIGH (ref 65–99)
Glucose-Capillary: 122 mg/dL — ABNORMAL HIGH (ref 65–99)
Glucose-Capillary: 115 mg/dL — ABNORMAL HIGH (ref 65–99)
Glucose-Capillary: 140 mg/dL — ABNORMAL HIGH (ref 65–99)

## 2017-10-03 LAB — CBC WITH DIFFERENTIAL/PLATELET
Basophils Absolute: 0 10*3/uL (ref 0.0–0.1)
Basophils Relative: 0 %
EOS PCT: 0 %
Eosinophils Absolute: 0 10*3/uL (ref 0.0–0.7)
HEMATOCRIT: 35.6 % — AB (ref 36.0–46.0)
Hemoglobin: 11.6 g/dL — ABNORMAL LOW (ref 12.0–15.0)
LYMPHS PCT: 22 %
Lymphs Abs: 1.3 10*3/uL (ref 0.7–4.0)
MCH: 28.9 pg (ref 26.0–34.0)
MCHC: 32.6 g/dL (ref 30.0–36.0)
MCV: 88.8 fL (ref 78.0–100.0)
MONO ABS: 0.6 10*3/uL (ref 0.1–1.0)
MONOS PCT: 10 %
NEUTROS ABS: 4 10*3/uL (ref 1.7–7.7)
Neutrophils Relative %: 68 %
PLATELETS: 96 10*3/uL — AB (ref 150–400)
RBC: 4.01 MIL/uL (ref 3.87–5.11)
RDW: 17 % — AB (ref 11.5–15.5)
WBC: 6 10*3/uL (ref 4.0–10.5)

## 2017-10-03 LAB — PROCALCITONIN
PROCALCITONIN: 12.94 ng/mL
PROCALCITONIN: 16.53 ng/mL

## 2017-10-03 LAB — SODIUM
SODIUM: 158 mmol/L — AB (ref 135–145)
SODIUM: 164 mmol/L — AB (ref 135–145)
Sodium: 163 mmol/L (ref 135–145)

## 2017-10-03 LAB — POTASSIUM: Potassium: 2.4 mmol/L — CL (ref 3.5–5.1)

## 2017-10-03 MED ORDER — ORAL CARE MOUTH RINSE
15.0000 mL | Freq: Two times a day (BID) | OROMUCOSAL | Status: DC
  Administered 2017-10-03 – 2017-10-06 (×9): 15 mL via OROMUCOSAL

## 2017-10-03 MED ORDER — POTASSIUM CHLORIDE 10 MEQ/50ML IV SOLN
10.0000 meq | INTRAVENOUS | Status: AC
  Administered 2017-10-03 (×5): 10 meq via INTRAVENOUS
  Filled 2017-10-03 (×5): qty 50

## 2017-10-03 MED ORDER — LEVOFLOXACIN IN D5W 500 MG/100ML IV SOLN
500.0000 mg | INTRAVENOUS | Status: DC
  Administered 2017-10-03: 500 mg via INTRAVENOUS
  Filled 2017-10-03 (×2): qty 100

## 2017-10-03 MED ORDER — CHLORHEXIDINE GLUCONATE 0.12 % MT SOLN
15.0000 mL | Freq: Two times a day (BID) | OROMUCOSAL | Status: DC
  Administered 2017-10-03 – 2017-10-06 (×7): 15 mL via OROMUCOSAL
  Filled 2017-10-03 (×3): qty 15

## 2017-10-03 MED ORDER — THIAMINE HCL 100 MG/ML IJ SOLN
100.0000 mg | Freq: Every day | INTRAMUSCULAR | Status: DC
  Administered 2017-10-03 – 2017-10-12 (×10): 100 mg via INTRAVENOUS
  Filled 2017-10-03 (×11): qty 2

## 2017-10-03 MED ORDER — CALCIUM GLUCONATE 10 % IV SOLN: 1.0000 g | Freq: Once | INTRAVENOUS | Status: DC

## 2017-10-03 NOTE — Progress Notes (Signed)
  Speech Language Pathology Treatment: Dysphagia;Cognitive-Linquistic  Patient Details Name: Ashlee Snow MRN: [id number] DOB: 27-Dec-1955 Today's Date: 10/03/2017 Time: 1015-1027 SLP Time Calculation (min) (ACUTE ONLY): 12 min  Assessment / Plan / Recommendation Clinical Impression  Pt up in chair, demonstrates improved arousal , responsiveness, attention and breath support. Vocal quality clear at baseline, but after being given one ice chip, vocal quality persistently wet, pt could not clear with max cues for throat clearing and multiple swallows. Swallow effort feels quite weak on palpation. Pt demonstrating progress, would benefit from short term alternate nutrition as she continues to improve. Not yet ready for objective test. Will follow.   HPI HPI: This is a 61 year old with a past medical history of fibromyalgia, hypertension, and hypothyroidism who for several days prior to admission had apparently been suffering from  Confusion, fevers, N/V, right flank pain,. Found to have Severe sepsis secondary to pyelonephritis with ureteral obstruction and MRI: Acute/subacute inferior right cerebellum and vermis infarct with associated hemorrhage. Additional scattered punctate foci of acute nonhemorrhagic infarct involving the right PCA territory with 1 lesion involving the right MCA territory along the right middle frontal gyrus. Multiple foci of remote encephalomalacia involving the inferior posterior right temporal lobe, lateral right temporal lobe, and high right parietal lobe      SLP Plan          Recommendations  Diet recommendations: NPO Medication Administration: Via alternative means                General recommendations: Rehab consult Oral Care Recommendations: Oral care QID Follow up Recommendations: Inpatient Rehab       GO               [facility]ownBonnie Jac Snow, [place] CCC-SLP [phone]  Claudine MoutonDeBlois, Ashlee Snow 10/03/2017, 10:46 AM

## 2017-10-03 NOTE — Progress Notes (Signed)
PULMONARY / CRITICAL CARE MEDICINE   Name: Ashlee Snow MRN: [id number] DOB: 12-08-1955    ADMISSION DATE:  10/03/2017   CHIEF COMPLAINT: Fever and nausea  HISTORY OF PRESENT ILLNESS:   This is a 61 year old with a past medical history of fibromyalgia, hypertension, and hypothyroidism who for several days prior to admission had apparently been suffering from nausea and vomiting.  She subsequently had fevers and was evaluated at an outside hospital where she was found to have left hydronephrosis.  A ureteral stent was placed and she was treated for urosepsis with antibiotics and generous fluids.  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   Yesterday evening her oxygen requirements increased substantially and she had worsening rhonchi bilaterally but predominantly on the left.  Her oxygen requirements have come back down some overnight and she is now on a 15 L simple mask.  She is phonating more but not composing sentences.  She is very very slow to respond to instructions.  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  PAST SURGICAL HISTORY: She  has a past surgical history that includes Cholecystectomy (01/22/1997); Abdominal hysterectomy; Ovarian cyst surgery; aortogram w/ stenting (2007, 2011, Aug. 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  Allergies  Allergen Reactions  . Ativan [Lorazepam]  Other (See Comments)    Patient's husband said related to being oversedated not true allergy    No current facility-administered medications on file prior to encounter.    Current Outpatient Medications on File Prior to Encounter  Medication Sig  . BLACK COHOSH PO Take 3 capsules by mouth daily.   . ergocalciferol (VITAMIN D2) 50000 units capsule Take 50,000 Units by mouth once a week. Monday  . HM ASPIRIN 81 MG chewable tablet Chew 81 mg by mouth 2 (two) times daily.   Marland Kitchen levothyroxine (SYNTHROID, LEVOTHROID) 75 MCG tablet Take 75 mcg by mouth daily before breakfast.  . LINZESS 145 MCG CAPS capsule Take 145 mcg by mouth daily.  Marland Kitchen lisinopril (PRINIVIL,ZESTRIL) 20 MG tablet Take 20 mg by mouth every morning.   Marland Kitchen LYRICA 150 MG capsule Take 150 mg by mouth 2 (two) times daily.  . methocarbamol (ROBAXIN) 750 MG tablet Take 750 mg by mouth 3 (three) times daily.   . metoprolol succinate (TOPROL-XL) 25 MG 24 hr tablet Take 25 mg by mouth every morning.  Marland Kitchen oxyCODONE-acetaminophen (PERCOCET) 10-325 MG tablet Take 1 tablet by mouth every 6 (six) hours as needed for pain. (Patient taking differently: Take 1 tablet by mouth 4 (four) times daily. )  . pravastatin (PRAVACHOL) 20 MG tablet Take 20 mg by mouth every morning.   . promethazine (PHENERGAN) 25 MG tablet Take 1 tablet by mouth every 6 (six) hours as needed.  . venlafaxine XR (EFFEXOR-XR) 75 MG 24 hr capsule Take 150 mg every morning by mouth.     FAMILY HISTORY:  Her indicated that her mother is deceased. She indicated that her father is deceased. She indicated that her sister is deceased. She indicated that the status of her neg hx is unknown.   SOCIAL HISTORY:  She  reports that she has been smoking e-cigarettes.  She started smoking about 42 years ago. She has been smoking about 1.00 pack per day. she has never used smokeless tobacco. She reports that she does not drink alcohol or use drugs.  REVIEW OF SYSTEMS:    Unobtainable  SUBJECTIVE:  Unobtainable  VITAL SIGNS: BP (!) 167/94   Pulse 89   Temp 99.8 F (37.7 C) (Axillary)   Resp 16   Ht 5\' 4"  (1.626 m)   Wt 142 lb 3.2 oz (64.5 kg)   SpO2 99%   BMI 24.41 kg/m       INTAKE / OUTPUT: I/O last 3 completed shifts: In: 3135.6 [I.V.:2785.6; IV Piggyback:350] Out: 3885 [Urine:3885]  PHYSICAL EXAMINATION: General: She is lethargic than yesterday, constantly moaning but not interacting significantly. Neuro: Phonates occasionally but does not compose sentences.  Nipples are equal today.  She does not have exaggerated nystagmus.  The face is symmetric.  Moves all fours but seems to be relatively weak on the left.  There is perseveration of motor movements. Cardiovascular: S1 and S2 are regular without murmur rub or gallop. Lungs: Respirations are unlabored but she has lots of scattered rhonchi predominantly on the left.    Air movement is symmetrical. Abdomen: Abdomen is soft without organomegaly masses tenderness guarding or rebound.  LABS:  BMET Recent Labs  Lab 10/01/17 0333  10/02/17 0616  10/02/17 1730 10/03/17 0017 10/03/17 0716  NA 136   < > 151*  151*   < > 156* 158* 163*  K 3.4*  --  2.8*  --  3.0*  --   --   CL 107  --  121*  --   --   --   --   CO2 17*  --  18*  --   --   --   --   BUN 24*  --  20  --   --   --   --   CREATININE 1.57*  --  1.30*  --   --   --   --   GLUCOSE 153*  --  148*  --   --   --   --    < > = values in this interval not displayed.    Electrolytes Recent Labs  Lab 10/01/17 0317 10/01/17 0333 10/02/17 0616  CALCIUM  --  8.1* 7.5*  MG 1.3*  --  2.2    CBC Recent Labs  Lab 10/01/17 0333 10/02/17 0616 10/03/17 0716  WBC 6.5 5.3 6.0  HGB 13.0 10.2* 11.6*  HCT 37.4 30.7* 35.6*  PLT 77* 85* 96*    Coag's Recent Labs  Lab 10/14/2017 2104  APTT 33  INR 1.08    Sepsis Markers Recent Labs  Lab 10/01/17 0333 10/01/17 0420 10/02/17 0839 10/02/17 1137 10/03/17 0017   LATICACIDVEN  --  3.1* 1.6 1.4  --   PROCALCITON 41.84  --   --   --  16.53    ABG No results for input(s): PHART, PCO2ART, PO2ART in the last 168 hours.  Liver Enzymes Recent Labs  Lab 10/01/17 0333  AST 183*  ALT 72*  ALKPHOS 50  BILITOT 0.6  ALBUMIN 2.7*    Cardiac Enzymes Recent Labs  Lab 10/01/17 0317 10/01/17 0420 10/01/17 1730  TROPONINI 1.04* 1.07* 0.58*    Glucose Recent Labs  Lab 10/02/17 1211 10/02/17 1652 10/02/17 1930 10/03/17 0025 10/03/17 0353 10/03/17 0818  GLUCAP 115* 127* 130* 112* 135* 125*  Imaging Ct Head Wo Contrast  Result Date: 10/02/2017 CLINICAL DATA:  Cerebellar infarct follow-up. EXAM: CT HEAD WITHOUT CONTRAST TECHNIQUE: Contiguous axial images were obtained from the base of the skull through the vertex without intravenous contrast. COMPARISON:  MRI 10/01/2017 and CT exams dating back to 11-05-16 more recently on 10/01/2017. FINDINGS: Brain: Re-demonstration of acute to subacute inferior right cerebellar infarction with positive mass effect on the adjacent fourth ventricle. Scant areas of hyperdensity noted within the infarct consistent with known hemorrhagic component. Chronic right parietal, posteromedial temporal, occipital lobe infarcts and right periventricular white matter tract lacunar infarct. Slight ex vacuo dilatation of the right lateral ventricle. No new infarct, intra-axial mass nor extra-axial fluid. Vascular: Atherosclerotic vascular calcifications of the carotid siphons. Skull: Negative for acute fracture or focal lesions. Sinuses/Orbits: No acute finding. Other: None IMPRESSION: 1. No significant change in the appearance of acute to subacute, hemorrhagic right inferior cerebellar infarct with slight positive mass effect on the adjacent fourth ventricle. No dilatation the third or lateral ventricles. 2. Chronic right parietal, posteromedial temporal and occipital lobe infarcts as well as right periventricular white matter  tract lacunar infarct. Electronically Signed   By: Tollie Ethavid  Kwon M.D.   On: 10/02/2017 16:01   Dg Chest Port 1 View  Result Date: 10/02/2017 CLINICAL DATA:  Dyspnea EXAM: PORTABLE CHEST 1 VIEW COMPARISON:  10/02/2017 FINDINGS: Cardiac shadow is stable. Right-sided PICC line is again seen and stable. The lungs are well aerated bilaterally. Patchy left basilar infiltrate is again seen and stable. No new focal abnormality is noted. No bony abnormality is seen. IMPRESSION: Left basilar infiltrate. Electronically Signed   By: Alcide CleverMark  Lukens M.D.   On: 10/02/2017 17:38       ANTIBIOTICS: Meropenem and vancomycin    DISCUSSION: This is a 61 year old with a history of hypertension, fibromyalgia, peripheral vascular disease, and prior CVA who was treated for a UTI and hydronephrosis with placement of a left ureteral stent.  She had persistent difficulties with nausea and vomiting and a head CT has shown a right cerebellar infarct.  ASSESSMENT / PLAN:  PULMONARY A: He deteriorated overnight from a respiratory standpoint and I am again concerned about the possibility of aspiration.  Her pro-calcitonin is up and I added Levaquin to her nafcillin to better cover potential aspiration.    CARDIOVASCULAR A: We are probably more than 72 hours out from her CVA and I to need to try to bring her hypertension into better control.  Unfortunately I do not think she has a reliable enteral route at this point and I am going to continue her on an IV ACE inhibitor plus as needed labetalol.      INFECTIOUS A: Urinary tract infection, subsequent likely aspiration.  I am going to continue her on a combination of nafcillin for the MSSA in her blood and Levaquin.  I am a little concerned that MSSA is a unusual organism for urinary tract infection and we did not isolate that organism from the urine.  Also concerned that she has had bypass surgery in the past and likely has a Gore-Tex graft in place.  She may require  protracted therapy for her staph bacteremia.  I am awaiting sensitivity on the staff to determine whether or not we can consolidate her antibiotic treatment to cover both aspiration and the MSSA.   NEUROLOGIC A: She is at least starting to phonate some which is an improvement.  Fortunately at this point I think her infectious issues are contributing to  her decreased sensorium.  Continuing to treat her infections as noted.  I am also somewhat concerned that she has a pancytopenia and although the history does not reported history of alcoholism I have added thiamine.  Greater than 32 minutes was spent in the care of this patient today   Penny Pia, MD Pulmonary and Critical Care Medicine [facility] Pager: [phone]  10/03/2017, 9:41 AM

## 2017-10-03 NOTE — Progress Notes (Signed)
Physical Therapy Treatment Patient Details Name: Ashlee Snow MRN: [id number] DOB: [birth year] Today's Date: 10/03/2017    History of Present Illness This is a 61 year old with a past medical history of fibromyalgia, hypertension, and hypothyroidism who for several days prior to admission had apparently been suffering from  Confusion, fevers, N/V, right flank pain,. Found to have Severe sepsis secondary to pyelonephritis with ureteral obstruction and MRI: Acute/subacute inferior right cerebellum and vermis infarct with associated hemorrhage. Additional scattered punctate foci of acute nonhemorrhagic infarct involving the right PCA territory with 1 lesion involving the right MCA territory along the right middle frontal gyrus. Multiple foci of remote encephalomalacia involving the inferior posterior right temporal lobe, lateral right temporal lobe, and high right parietal lobe    PT Comments    Pt more alert and verbal today. Pt with consistent simple command following this date. Pt with noted L sided neglect, L sided weakness UE flaccid except grip compared to the LE. Pt did tolerate sit to stand and std pvt to chair this date. Acute PT to con't to follow. Pt remains appropriate for CIR upon d/c.   Follow Up Recommendations  CIR     Equipment Recommendations  None recommended by PT    Recommendations for Other Services Rehab consult     Precautions / Restrictions Precautions Precautions: Fall Restrictions Weight Bearing Restrictions: No    Mobility  Bed Mobility Overal bed mobility: Needs Assistance Bed Mobility: Rolling;Sidelying to Sit Rolling: Mod assist;+2 for physical assistance Sidelying to sit: Max assist;+2 for safety/equipment       General bed mobility comments: pt unable to intiate transfer, pt unable ot use L UE functionally, maxA for LE managment off EOB and trunk elevation  Transfers Overall transfer level: Needs assistance Equipment used: (2 person lift with  bed pad) Transfers: Sit to/from BJ'sStand;Stand Pivot Transfers Sit to Stand: Max assist;+2 physical assistance Stand pivot transfers: Max assist;+2 physical assistance       General transfer comment: pt initiated stand to verbal cues, required bilat knee blocking to achieve upright standing, maxA to advance LEs to complete std pvt. Pt with bilat LE locked in ext and bilat ankles in PF.  Ambulation/Gait             General Gait Details: unable   Stairs            Wheelchair Mobility    Modified Rankin (Stroke Patients Only) Modified Rankin (Stroke Patients Only) Pre-Morbid Rankin Score: No significant disability Modified Rankin: Severe disability     Balance Overall balance assessment: Needs assistance Sitting-balance support: No upper extremity supported Sitting balance-Leahy Scale: Fair Sitting balance - Comments: pt able to maintain EOB balance briefly without physical assist. pt sat EOB x8 min with min/modA Postural control: Right lateral lean;Posterior lean Standing balance support: Bilateral upper extremity supported Standing balance-Leahy Scale: Poor                              Cognition Arousal/Alertness: Awake/alert Behavior During Therapy: Flat affect Overall Cognitive Status: Impaired/Different from baseline Area of Impairment: Following commands;Safety/judgement;Awareness;Problem solving                   Current Attention Level: Sustained   Following Commands: Follows one step commands with increased time Safety/Judgement: Decreased awareness of deficits Awareness: Intellectual Problem Solving: Slow processing;Decreased initiation;Difficulty sequencing;Requires verbal cues;Requires tactile cues General Comments: pt more verbal today with consistent command follow, improved tracking  towards the left      Exercises General Exercises - Lower Extremity Ankle Circles/Pumps: Strengthening;Both;10 reps;Seated Long Arc Quad:  AROM;Both;10 reps;Seated    General Comments        Pertinent Vitals/Pain Pain Assessment: Faces Faces Pain Scale: No hurt Pain Intervention(s): Monitored during session    Home Living                      Prior Function            PT Goals (current goals can now be found in the care plan section) Progress towards PT goals: Progressing toward goals    Frequency    Min 4X/week      PT Plan Current plan remains appropriate    Co-evaluation              AM-PAC PT "6 Clicks" Daily Activity  Outcome Measure  Difficulty turning over in bed (including adjusting bedclothes, sheets and blankets)?: Unable Difficulty moving from lying on back to sitting on the side of the bed? : Unable Difficulty sitting down on and standing up from a chair with arms (e.g., wheelchair, bedside commode, etc,.)?: Unable Help needed moving to and from a bed to chair (including a wheelchair)?: Total Help needed walking in hospital room?: Total Help needed climbing 3-5 steps with a railing? : Total 6 Click Score: 6    End of Session Equipment Utilized During Treatment: Oxygen Activity Tolerance: Patient tolerated treatment well Patient left: in chair;with call bell/phone within reach;with chair alarm set;with family/visitor present Nurse Communication: Mobility status PT Visit Diagnosis: Unsteadiness on feet (R26.81);Other symptoms and signs involving the nervous system (R29.898)     Time: [id number] PT Time Calculation (min) (ACUTE ONLY): 26 min  Charges:  $Therapeutic Activity: 8-22 mins $Neuromuscular Re-education: 8-22 mins                    G Codes:       Lewis ShockAshly Kelin Borum, PT, DPT Pager #: [phone] Office #: [phone]    Kinsley Holderman M Daiwik [place] 10/03/2017, 10:21 AM

## 2017-10-03 NOTE — Progress Notes (Signed)
CRITICAL VALUE ALERT  Critical Value:  Na 163  Date & Time Notied:  10/03/17, 0757  Provider Notified: Dr. Pearlean BrownieSethi  Orders Received/Actions taken: 3% saline turned off per protocol

## 2017-10-03 NOTE — Progress Notes (Signed)
CRITICAL VALUE ALERT  Critical Value:  Na 164  Date & Time Notied:  10/03/17, 1420  Provider Notified: Dr. Pearlean BrownieSethi  Orders Received/Actions taken: Orders written

## 2017-10-03 NOTE — Progress Notes (Signed)
At 0330, pt SpO2 went down to lower 80's on non-rebreather. Called RT and Elink. We decided to proceed with nasotracheal suctioning. Patient now showing 99% on SpO2. Will continue to monitor

## 2017-10-03 NOTE — Care Management Note (Signed)
Case Management Note  Patient Details  Name: Ashlee Snow MRN: [id number] Date of Birth: 07/24/1956  Subjective/Objective:  Pt admitted on 09/27/2017 with severe sepsis with pyelonephritis and ureteral obstruction, and acute large RT cerebellar stroke with hemorrhage.   PTA, pt resided at home with spouse, and was independent of all ADLS.                    Action/Plan: PT/OT recommending CIR; recommend CIR consult when medically appropriate.    Expected Discharge Date:                  Expected Discharge Plan:  IP Rehab Facility  In-House Referral:     Discharge planning Services  CM Consult  Post Acute Care Choice:    Choice offered to:     DME Arranged:    DME Agency:     HH Arranged:    HH Agency:     Status of Service:  In process, will continue to follow  If discussed at Long Length of Stay Meetings, dates discussed:    Additional Comments:  Quintella BatonJulie W. Vedh Ptacek, RN, BSN  Trauma/Neuro ICU Case Manager [phone]

## 2017-10-03 NOTE — Progress Notes (Signed)
STROKE TEAM PROGRESS NOTE  Admission History:  Ashlee Snow is an 61 y.o. female presenting from OSH with large subacute right cerebellar ischemic infarction with mass effect and pyelonephritis.   She presented to the emergency department for evaluation of confusion, right flank pain, and nausea with vomiting. Patient had reportedly been complaining of right flank pain for a couple weeks now, was otherwise well until the afternoon of 09/28/2017 when her pain increased fairly acutely and she developed nausea with nonbloody vomiting. She did not get out of bed the following day due to her illness and became quite confused by that evening.She had refused to go to the emergency department, but with her condition persisting today, her husband overrode her decision and she was taken to the ED.  [place]:Upon arrival to the ED, patient is found to befebrile to 40.3 degree C, saturating well on room air, tachypneic to 30, tachycardic to 150, and with stable blood pressure. EKG features a sinus tachycardia with rate 141 and nonspecific T wave abnormality in the lateral leads. Chest x-ray is negative for acute cardiopulmonary disease. CT abdomen and pelvis is notable for right-sided hydronephrosis and proximal right hydroureter without definite calculus, raising concern for stricture/scar. Lactic acid was elevated to 11.9 the patient was treated with 2 L of lactated Ringer's and 2 g of IV Rocephin in the ED. A CT head was obtained at the OSH revealing a large subacute right cerebellar infarction with mass effect.   SUBJECTIVE (INTERVAL HISTORY)  Husband is at the bedside. Patient is found laying in bed, .she is more interactive today. Repeat Head CT y`day show stable infarcts without any new finding. EEG showed no seizures OBJECTIVE Lab Results: CBC:  Recent Labs  Lab 10/01/17 0333 10/02/17 0616 10/03/17 0716  WBC 6.5 5.3 6.0  HGB 13.0 10.2* 11.6*  HCT 37.4 30.7* 35.6*  MCV 84.4  87.5 88.8  PLT 77* 85* 96*   BMP: Recent Labs  Lab 10/01/17 0317 10/01/17 0333  10/02/17 0616 10/02/17 1310 10/02/17 1730 10/03/17 0017 10/03/17 0716 10/03/17 1017 10/03/17 1329  NA  --  136   < > 151*  151* 154* 156* 158* 163*  --  164*  K  --  3.4*  --  2.8*  --  3.0*  --   --  2.4*  --   CL  --  107  --  121*  --   --   --   --   --   --   CO2  --  17*  --  18*  --   --   --   --   --   --   GLUCOSE  --  153*  --  148*  --   --   --   --   --   --   BUN  --  24*  --  20  --   --   --   --   --   --   CREATININE  --  1.57*  --  1.30*  --   --   --   --   --   --   CALCIUM  --  8.1*  --  7.5*  --   --   --   --   --   --   MG 1.3*  --   --  2.2  --   --   --   --   --   --    < > =  values in this interval not displayed.   Liver Function Tests:  Recent Labs  Lab 10/01/17 0333  AST 183*  ALT 72*  ALKPHOS 50  BILITOT 0.6  PROT 6.1*  ALBUMIN 2.7*   Recent Labs  Lab 10/01/17 0317  AMMONIA 36*   Cardiac Enzymes:  Recent Labs  Lab 10/01/17 0317 10/01/17 0420 10/01/17 1730  TROPONINI 1.04* 1.07* 0.58*   Coagulation Studies:  Recent Labs    Apr 30, 2017 2104  APTT 33  INR 1.08   PHYSICAL EXAM Temp:  [98.4 F (36.9 C)-99.8 F (37.7 C)] 99.8 F (37.7 C) (12/11 0800) Pulse Rate:  [75-91] 83 (12/11 1300) Resp:  [16-39] 19 (12/11 1300) BP: (150-176)/(80-103) 169/92 (12/11 1300) SpO2:  [91 %-100 %] 94 % (12/11 1420) FiO2 (%):  [40 %-100 %] 40 % (12/11 1200) Weight:  [142 lb 3.2 oz (64.5 kg)] 142 lb 3.2 oz (64.5 kg) (12/11 0500) General - Well nourished, well developed,  Respiratory - Tachypneic. . Cardiovascular - Regular rate and rhythm   Neurologic Examination: Mental Status: Awake. Impaired attention. Speech output is minimal and hypophonic, dysarthric, fragmentary and barely intelligible. Able to say "water". Not oriented.  Abulic. Fatigued-appearing. Does not fixate well on visual stimuli.  Cranial Nerves: II:  No blink to threat on the left. Intact  blink to threat on the right. Right pupil 4 mm and reactive. Left pupil 6 mm and sluggishly reactive.  III,IV, VI: Saccadic dysmetria right greater than left but smooth pursuits bilaterally. Crosses midline bilaterally with conjugate EOM, but will not gaze fully to left or right. No nystagmus.  V,VII: No definite facial droop. Reacts to tactile stimulation bilaterally.  VIII: Hearing intact to voice IX,X: Hypophonic speech.   XI: Head at midline XII: midline tongue extension  Motor/Sensory: RUE: Biceps, triceps, deltoid and grip 2-3/5 LUE: Biceps, triceps, deltoid and grip 2-3/5 RLE:   Withdraws from noxious with 3-4/5 strength.  LLE:   Withdraws from noxious with 2/5 strength.  Deep Tendon Reflexes:  Brisk, low-amplitude 3+ reflexes throughout. Toes equivocal bilaterally.  Cerebellar: unable to perform  Gait: not tested  IMAGING: I have personally reviewed the radiological images below and agree with the radiology interpretations. Ct Head Wo Contrast Result Date: 10/01/2017 IMPRESSION: No significant change since yesterday. Acute infarction affecting the right cerebellum with swelling. Mass-effect upon the fourth ventricle but no change in size the lateral or third ventricles at this time. No hemorrhage. 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 Electronically Signed   By: Paulina FusiMark  Shogry M.D.   On: 10/01/2017 07:45   MRI/MRA Brain Wo Contrast Result Date: 10/01/2017 IMPRESSION: 1. Acute/subacute inferior right cerebellum and vermis infarct with associated hemorrhage. 2. Additional scattered punctate foci of acute nonhemorrhagic infarct involving the right PCA territory with 1 lesion involving the right MCA territory along the right middle frontal gyrus. 3. Multiple foci of remote encephalomalacia involving the inferior posterior right temporal lobe, lateral right temporal lobe, and high right parietal lobe. 4. High-grade stenosis or occlusion of the cavernous right  internal carotid artery. The right A1 and likely M1 segments fill via a patent anterior communicating artery. 5. Fenestration of the vertebrobasilar junction without significant basilar artery stenosis. 6. Marked attenuation of PCA branch vessels, worse on the right. Moderate proximal right PCA stenosis. 7. Fetal type left posterior cerebral artery.   Echocardiogram:  Study Conclusions - Left ventricle: The cavity size was normal. Wall thickness was   increased in a pattern of mild LVH. Systolic function was normal.  The estimated ejection fraction was in the range of 60% to 65%.   Wall motion was normal; there were no regional wall motion   abnormalities. Doppler parameters are consistent with abnormal   left ventricular relaxation (grade 1 diastolic dysfunction). - Mitral valve: There was mild regurgitation.  B/L Carotid U/S:                                                Right ECA and ICA waveforms are very similar. Unable to interrogate stenosis.  40-59% left ICA stenosis. Bilateral vertebral artery flow is antegrade.   CT Head:         10/02/17                                                   1. No significant change in the appearance of acute to subacute, hemorrhagic right inferior cerebellar infarct with slight positive mass effect on the adjacent fourth ventricle. No dilatation the third or lateral ventricles. 2. Chronic right parietal, posteromedial temporal and occipital lobe infarcts as well as right periventricular white matter tract lacunar infarct.   EEG:   This normal EEG is recorded in the waking state                                                                 _____________________________________________________________________ ASSESSMENT: Ashlee Snow is a 61 y.o. female with PMH of prior hypertension, coronary artery disease, hypothyroidism, chronic pain, depression, and peripheral arterial disease status post aorto-iliac bypass in June.   Acute, large Right  cerebellar stroke with hemorrhage and mass effect Scattered punctate foci of acute nonhemorrhagic infarct -Right PCA territory Multiple foci of remote encephalomalacia - inferior posterior right temporal lobe, lateral right temporal lobe, and high right parietal lobe High-grade stenosis or occlusion of the cavernous right internal carotid artery.  Moderate proximal right PCA stenosis  Suspected Etiology:  Likely large vessel disease with high-grade terminal right ICA stenosis/occlusion and intracranial atherosclerosis  Resultant Symptoms:  Stroke Risk Factors: hyperlipidemia, hypertension and CAD, PVD Other Stroke Risk Factors: Advanced age, Cigarette smoker, Migraines     PLAN  10/03/2017: DC 3% Na 162-  today HOLD ASA for now, due to thrombocytopenia and risk of hemorrhagic conversion. TEE pending requested by ID for endocarditis Neurosurgery consulted - no need for decomp surgery at this time. Appreciate recs Frequent neuro checks Telemetry monitoring Ongoing aggressive stroke risk factor management Follow up with [facility] in 6 weeks  Encephalopathy Likely multifactorial Will check EEG to r/o seizure activity  INTRACRANIAL Atherosclerosis &Stenosis: DAPT to be considered at discharge once patient improves  DYSPHAGIA: NPO until passes SLP swallow evaluation  MULTIPLE MEDICAL ISSUES Management per CCM team  HYPERTENSION: Stable, some elevated B/P's noted overnight Permissive hypertension (OK if <220/120) for 24-48 hours post stroke and then gradually normalized within 5-7 days. Nicardipine drip, Labetolol PRN-Avoid SBP less than 130 Long term BP goal normotensive. May slowly restart home B/P medications after 48  hours Home Meds: Metoprolol, Lisinopril  HYPERLIPIDEMIA:    Component Value Date/Time   CHOL 147 10/01/2017 0420   TRIG 335 (H) 10/01/2017 0420   HDL 34 (L) 10/01/2017 0420   CHOLHDL 4.3 10/01/2017 0420   VLDL 67 (H) 10/01/2017 0420    LDLCALC 46 10/01/2017 0420  Home Meds:  Pravachol 20 mg LDL  goal < 70 HOLD Pravachol 20 mg daily for now, until LFT's improve Continue statin at discharge, if LFT's improve  DIABETES: Lab Results  Component Value Date   HGBA1C 6.3 (H) 10/01/2017  HgbA1c goal < 7.0 Currently [id number] Continue CBG monitoring and SSI DM education when applicable   TOBACCO ABUSE Current smoker Smoking cessation counseling when applicable Nicotine patch PRN  Other Active Problems: Principal Problem:   Sepsis (HCC) Active Problems:   Stroke (cerebrum) (HCC)   AKI (acute kidney injury) (HCC)   Obstructive pyelonephritis   Thrombocytopenia (HCC)   Hypokalemia   Lactic acidosis   Encephalopathy acute   MRSA bacteremia  Hospital day # 3  VTE prophylaxis: SCD's  Diet : Diet NPO time specified   Prior Home Stroke Medications: aspirin 81 mg daily and Pravachol 20 mg  Discharge Stroke Meds: Please discharge patient on TBD   Disposition: 01-Home or Self Care Therapy Recs:  CIR Follow Recs:  Follow-up Information    Micki Riley, MD. Schedule an appointment as soon as possible for a visit in 6 week(s).   Specialties:  Neurology, Radiology Contact information: [address] [place] [address] [phone]          Kirstie Peri, MD- PCP follow up in 1-2 weeks after discharge  FAMILY UPDATES: Husband at bedside  TEAM UPDATES: Nelda Bucks, MD    I have personally examined this patient, reviewed notes, independently viewed imaging studies, participated in medical decision making and plan of care.ROS completed by me personally and pertinent positives fully documented  I have made any additions or clarifications directly to the above note.  . Patient has presented with multiple posterior circulation time right hemispheric infarcts likely due to significant intracranial and extracranial atherosclerosis. She also has significant history of peripheral vascular disease  and has aortic stent grafting with possible infection. Check transesophageal echocardiogram for endocarditis as per ID. Long discussion with her husband and Dr. Wallace Cullens at the bedside and answered questions. This patient is critically ill and at significant risk of neurological worsening, death and care requires constant monitoring of vital signs, hemodynamics,respiratory and cardiac monitoring, extensive review of multiple databases, frequent neurological assessment, discussion with family, other specialists and medical decision making of high complexity.I have made any additions or clarifications directly to the above note.This critical care time does not reflect procedure time, or teaching time or supervisory time of PA/NP/Med Resident etc but could involve care discussion time.  I spent 35 minutes of neurocritical care time  in the care of  this patient.     Delia Heady, MD Medical Director [facility] Pager: [phone] 10/03/2017 2:41 PM  To contact Stroke Continuity provider, please refer to [email or web address]. After hours, contact General Neurology

## 2017-10-03 NOTE — Progress Notes (Signed)
[facility]  Date of Admission:  09/26/2017     Total days of antibiotics 4  Nafcillin Day 1 Meropenem 3 days (stopped 12/10) Vancomycin 2 days (stopped 12/09) Levofloxacin 1 day        ASSESSMENT/PLAN  Right Cerebellar Stroke with Mass Effect - Continues on hypertonic saline. Sodium of 163 this morning. Continued management per stroke team. More alert and responsive today.  Awaiting TEE to rule out possible septic emboli.   MSSA Bacteremia - Afebrile overnight and tolerating antibiotics with no adverse side effects. Awaiting TEE. Repeat blood cultures ordered. Continue Nafcillin. Will likely require at least 2-4 weeks of IV antibiotic therapy pending blood cultures and TEE results.   Hydronephrosis - Status post urethral stent and now with Foley.    Principal Problem:   Sepsis (HCC) Active Problems:   Stroke (cerebrum) (HCC)   AKI (acute kidney injury) (HCC)   Obstructive pyelonephritis   Thrombocytopenia (HCC)   Hypokalemia   Lactic acidosis   Encephalopathy acute   MRSA bacteremia   .  stroke: mapping our early stages of recovery book   Does not apply Once  . Chlorhexidine Gluconate Cloth  6 each Topical Daily  . enalaprilat  0.625 mg Intravenous Q6H  . insulin aspart  0-9 Units Subcutaneous Q4H  . ipratropium-albuterol  3 mL Nebulization Q6H  . levothyroxine  37.5 mcg Intravenous Daily  . pantoprazole (PROTONIX) IV  40 mg Intravenous Q24H  . sodium chloride flush  10-40 mL Intracatheter Q12H    SUBJECTIVE: Afebrile overnight with no leukocytosis. Experienced an episode of desaturation improved with NT suction. More alert today.   Allergies  Allergen Reactions  . Ativan [Lorazepam] Other (See Comments)    Patient's husband said related to being oversedated not true allergy     Review of Systems: Review of Systems  Constitutional: Negative for chills and fever.  Respiratory: Negative for cough, shortness of breath and wheezing.    Cardiovascular: Negative for chest pain and leg swelling.  Genitourinary: Negative for dysuria, flank pain, frequency, hematuria and urgency.  Neurological: Negative for weakness.      OBJECTIVE: Vitals:   10/03/17 0500 10/03/17 0600 10/03/17 0700 10/03/17 0800  BP: (!) 159/87 (!) 168/91 (!) 176/88 (!) 174/92  Pulse: 79 84 85 82  Resp: (!) 30 (!) 28 (!) 26 (!) 27  Temp:      TempSrc:      SpO2: 99% 99% 100% 100%  Weight: 142 lb 3.2 oz (64.5 kg)     Height:       Body mass index is 24.41 kg/m.  Physical Exam  Constitutional: She is well-developed, well-nourished, and in no distress. No distress.  Lying with head of bed up and non-rebreather in place. More verbal this morning.   Cardiovascular: Normal rate, regular rhythm, normal heart sounds and intact distal pulses.  Pulmonary/Chest: Effort normal and breath sounds normal. No respiratory distress. She has no wheezes. She has no rales. She exhibits no tenderness.  Work of breathing appears increased but not distressed or labored.   Abdominal: Soft. Bowel sounds are normal. She exhibits no distension.  Genitourinary:  Genitourinary Comments: Foley catheter in place and patent producing amber colored urine.  Neurological: She is alert.  Skin: Skin is warm and dry. No erythema.  PICC site clean, dry and intact.     Lab Results Lab Results  Component Value Date   WBC 6.0 10/03/2017   HGB 11.6 (L) 10/03/2017  HCT 35.6 (L) 10/03/2017   MCV 88.8 10/03/2017   PLT 96 (L) 10/03/2017    Lab Results  Component Value Date   CREATININE 1.30 (H) 10/02/2017   BUN 20 10/02/2017   NA 163 (HH) 10/03/2017   K 3.0 (L) 10/02/2017   CL 121 (H) 10/02/2017   CO2 18 (L) 10/02/2017    Lab Results  Component Value Date   ALT 72 (H) 10/01/2017   AST 183 (H) 10/01/2017   ALKPHOS 50 10/01/2017   BILITOT 0.6 10/01/2017     Microbiology: Recent Results (from the past 240 hour(s))  MRSA PCR Screening     Status: None   Collection  Time: 10/15/2017  7:52 PM  Result Value Ref Range Status   MRSA by PCR NEGATIVE NEGATIVE Final    Comment:        The GeneXpert MRSA Assay (FDA approved for NASAL specimens only), is one component of a comprehensive MRSA colonization surveillance program. It is not intended to diagnose MRSA infection nor to guide or monitor treatment for MRSA infections.   Culture, blood (x 2)     Status: None (Preliminary result)   Collection Time: 10/08/2017  8:35 PM  Result Value Ref Range Status   Specimen Description BLOOD LEFT ANTECUBITAL  Final   Special Requests IN PEDIATRIC BOTTLE Blood Culture adequate volume  Final   Culture  Setup Time   Final    GRAM POSITIVE COCCI IN CLUSTERS IN PEDIATRIC BOTTLE CRITICAL VALUE NOTED.  VALUE IS CONSISTENT WITH PREVIOUSLY REPORTED AND CALLED VALUE.    Culture GRAM POSITIVE COCCI  Final   Report Status PENDING  Incomplete  Culture, blood (x 2)     Status: None (Preliminary result)   Collection Time: 10/21/2017  8:40 PM  Result Value Ref Range Status   Specimen Description BLOOD LEFT ANTECUBITAL  Final   Special Requests IN PEDIATRIC BOTTLE Blood Culture adequate volume  Final   Culture  Setup Time   Final    GRAM POSITIVE COCCI IN CLUSTERS IN PEDIATRIC BOTTLE CRITICAL RESULT CALLED TO, READ BACK BY AND VERIFIED WITH: B MANCHERIL,PHARMD AT 0825 10/02/17 BY L BENFIELD    Culture GRAM POSITIVE COCCI  Final   Report Status PENDING  Incomplete  Blood Culture ID Panel (Reflexed)     Status: Abnormal   Collection Time: 10/10/2017  8:40 PM  Result Value Ref Range Status   Enterococcus species NOT DETECTED NOT DETECTED Final   Listeria monocytogenes NOT DETECTED NOT DETECTED Final   Staphylococcus species DETECTED (A) NOT DETECTED Final    Comment: CRITICAL RESULT CALLED TO, READ BACK BY AND VERIFIED WITH: B MANCHERIL,PHARMD AT 0825 10/02/17 BY L BENFIELD    Staphylococcus aureus DETECTED (A) NOT DETECTED Final    Comment: Methicillin (oxacillin) susceptible  Staphylococcus aureus (MSSA). Preferred therapy is anti staphylococcal beta lactam antibiotic (Cefazolin or Nafcillin), unless clinically contraindicated. CRITICAL RESULT CALLED TO, READ BACK BY AND VERIFIED WITH: B MANCHERIL,PHARMD AT 0825 10/02/17 BY L BENFIELD    Methicillin resistance NOT DETECTED NOT DETECTED Final   Streptococcus species NOT DETECTED NOT DETECTED Final   Streptococcus agalactiae NOT DETECTED NOT DETECTED Final   Streptococcus pneumoniae NOT DETECTED NOT DETECTED Final   Streptococcus pyogenes NOT DETECTED NOT DETECTED Final   Acinetobacter baumannii NOT DETECTED NOT DETECTED Final   Enterobacteriaceae species NOT DETECTED NOT DETECTED Final   Enterobacter cloacae complex NOT DETECTED NOT DETECTED Final   Escherichia coli NOT DETECTED NOT DETECTED Final   Klebsiella oxytoca  NOT DETECTED NOT DETECTED Final   Klebsiella pneumoniae NOT DETECTED NOT DETECTED Final   Proteus species NOT DETECTED NOT DETECTED Final   Serratia marcescens NOT DETECTED NOT DETECTED Final   Haemophilus influenzae NOT DETECTED NOT DETECTED Final   Neisseria meningitidis NOT DETECTED NOT DETECTED Final   Pseudomonas aeruginosa NOT DETECTED NOT DETECTED Final   Candida albicans NOT DETECTED NOT DETECTED Final   Candida glabrata NOT DETECTED NOT DETECTED Final   Candida krusei NOT DETECTED NOT DETECTED Final   Candida parapsilosis NOT DETECTED NOT DETECTED Final   Candida tropicalis NOT DETECTED NOT DETECTED Final     Marcos Eke, NP [facility] [phone] Pager  10/03/2017   Patient seen and examined, agree with above note.  I dictated the care and orders written for this patient under my direction. I was immediately available on site, by phone/page to assist in the care of this patient.   9:05 AM

## 2017-10-04 ENCOUNTER — Inpatient Hospital Stay (HOSPITAL_COMMUNITY): Payer: BLUE CROSS/BLUE SHIELD

## 2017-10-04 DIAGNOSIS — A4101 Sepsis due to Methicillin susceptible Staphylococcus aureus: Principal | ICD-10-CM

## 2017-10-04 DIAGNOSIS — T17908A Unspecified foreign body in respiratory tract, part unspecified causing other injury, initial encounter: Secondary | ICD-10-CM

## 2017-10-04 LAB — PHOSPHORUS: PHOSPHORUS: 3.5 mg/dL (ref 2.5–4.6)

## 2017-10-04 LAB — CULTURE, BLOOD (ROUTINE X 2)
SPECIAL REQUESTS: ADEQUATE
SPECIAL REQUESTS: ADEQUATE

## 2017-10-04 LAB — PROCALCITONIN: Procalcitonin: 6.44 ng/mL

## 2017-10-04 LAB — BASIC METABOLIC PANEL
BUN: 27 mg/dL — ABNORMAL HIGH (ref 6–20)
CALCIUM: 8.3 mg/dL — AB (ref 8.9–10.3)
CO2: 19 mmol/L — AB (ref 22–32)
CREATININE: 1.64 mg/dL — AB (ref 0.44–1.00)
Chloride: >130 mmol/L (ref 101–111)
GFR calc non Af Amer: 33 mL/min — ABNORMAL LOW (ref >60)
GFR, EST AFRICAN AMERICAN: 39 mL/min — AB (ref >60)
Glucose, Bld: 124 mg/dL — ABNORMAL HIGH (ref 65–99)
Potassium: 2.7 mmol/L — CL (ref 3.5–5.1)
SODIUM: 165 mmol/L — AB (ref 135–145)

## 2017-10-04 LAB — GLUCOSE, CAPILLARY
GLUCOSE-CAPILLARY: 101 mg/dL — AB (ref 65–99)
GLUCOSE-CAPILLARY: 103 mg/dL — AB (ref 65–99)
GLUCOSE-CAPILLARY: 98 mg/dL (ref 65–99)
Glucose-Capillary: 109 mg/dL — ABNORMAL HIGH (ref 65–99)
Glucose-Capillary: 118 mg/dL — ABNORMAL HIGH (ref 65–99)
Glucose-Capillary: 137 mg/dL — ABNORMAL HIGH (ref 65–99)
Glucose-Capillary: 144 mg/dL — ABNORMAL HIGH (ref 65–99)

## 2017-10-04 LAB — CBC WITH DIFFERENTIAL/PLATELET
BASOS ABS: 0 10*3/uL (ref 0.0–0.1)
BASOS PCT: 0 %
EOS ABS: 0 10*3/uL (ref 0.0–0.7)
Eosinophils Relative: 0 %
HEMATOCRIT: 36.8 % (ref 36.0–46.0)
Hemoglobin: 12.2 g/dL (ref 12.0–15.0)
Lymphocytes Relative: 24 %
Lymphs Abs: 2.1 10*3/uL (ref 0.7–4.0)
MCH: 29.5 pg (ref 26.0–34.0)
MCHC: 33.2 g/dL (ref 30.0–36.0)
MCV: 89.1 fL (ref 78.0–100.0)
Monocytes Absolute: 0.7 10*3/uL (ref 0.1–1.0)
Monocytes Relative: 8 %
NEUTROS ABS: 5.7 10*3/uL (ref 1.7–7.7)
NEUTROS PCT: 68 %
PLATELETS: 128 10*3/uL — AB (ref 150–400)
RBC: 4.13 MIL/uL (ref 3.87–5.11)
RDW: 17.3 % — ABNORMAL HIGH (ref 11.5–15.5)
WBC: 8.5 10*3/uL (ref 4.0–10.5)

## 2017-10-04 LAB — TSH: TSH: 5.894 u[IU]/mL — AB (ref 0.350–4.500)

## 2017-10-04 LAB — MAGNESIUM
MAGNESIUM: 2.1 mg/dL (ref 1.7–2.4)
MAGNESIUM: 2.2 mg/dL (ref 1.7–2.4)

## 2017-10-04 MED ORDER — AMLODIPINE BESYLATE 2.5 MG PO TABS
10.0000 mg | ORAL_TABLET | Freq: Every day | ORAL | Status: DC
  Administered 2017-10-06 – 2017-10-09 (×4): 10 mg
  Filled 2017-10-04 (×5): qty 4

## 2017-10-04 MED ORDER — MORPHINE SULFATE (PF) 4 MG/ML IV SOLN
1.0000 mg | Freq: Once | INTRAVENOUS | Status: AC
  Administered 2017-10-04: 1 mg via INTRAVENOUS
  Filled 2017-10-04: qty 1

## 2017-10-04 MED ORDER — PIPERACILLIN-TAZOBACTAM 3.375 G IVPB
3.3750 g | Freq: Three times a day (TID) | INTRAVENOUS | Status: AC
  Administered 2017-10-04 – 2017-10-10 (×20): 3.375 g via INTRAVENOUS
  Filled 2017-10-04 (×20): qty 50

## 2017-10-04 MED ORDER — FREE WATER
200.0000 mL | Freq: Four times a day (QID) | Status: DC
  Administered 2017-10-04 – 2017-10-05 (×2): 200 mL

## 2017-10-04 MED ORDER — PRO-STAT SUGAR FREE PO LIQD
30.0000 mL | Freq: Every day | ORAL | Status: DC
  Administered 2017-10-04 – 2017-10-07 (×4): 30 mL
  Filled 2017-10-04 (×5): qty 30

## 2017-10-04 MED ORDER — OSMOLITE 1.2 CAL PO LIQD
1000.0000 mL | ORAL | Status: DC
  Administered 2017-10-04 – 2017-10-05 (×2): 1000 mL
  Filled 2017-10-04 (×6): qty 1000

## 2017-10-04 MED ORDER — IPRATROPIUM-ALBUTEROL 0.5-2.5 (3) MG/3ML IN SOLN
3.0000 mL | Freq: Two times a day (BID) | RESPIRATORY_TRACT | Status: DC
  Administered 2017-10-04 – 2017-10-11 (×14): 3 mL via RESPIRATORY_TRACT
  Filled 2017-10-04 (×14): qty 3

## 2017-10-04 MED ORDER — IPRATROPIUM-ALBUTEROL 0.5-2.5 (3) MG/3ML IN SOLN
3.0000 mL | Freq: Four times a day (QID) | RESPIRATORY_TRACT | Status: DC
  Administered 2017-10-04: 3 mL via RESPIRATORY_TRACT
  Filled 2017-10-04: qty 3

## 2017-10-04 MED ORDER — ASPIRIN 300 MG RE SUPP
300.0000 mg | Freq: Every day | RECTAL | Status: DC
  Administered 2017-10-04 – 2017-10-05 (×2): 300 mg via RECTAL
  Filled 2017-10-04 (×2): qty 1

## 2017-10-04 MED ORDER — POTASSIUM CHLORIDE 10 MEQ/100ML IV SOLN
10.0000 meq | INTRAVENOUS | Status: AC
  Administered 2017-10-04 (×4): 10 meq via INTRAVENOUS
  Filled 2017-10-04 (×4): qty 100

## 2017-10-04 MED ORDER — POTASSIUM CHLORIDE 20 MEQ PO PACK
20.0000 meq | PACK | Freq: Every day | ORAL | Status: DC
  Administered 2017-10-04 – 2017-10-05 (×2): 20 meq via ORAL
  Filled 2017-10-04 (×3): qty 1

## 2017-10-04 MED ORDER — MORPHINE SULFATE (PF) 4 MG/ML IV SOLN
1.0000 mg | INTRAVENOUS | Status: DC | PRN
  Administered 2017-10-04 – 2017-10-05 (×3): 1 mg via INTRAVENOUS
  Filled 2017-10-04 (×4): qty 1

## 2017-10-04 NOTE — Progress Notes (Addendum)
STROKE TEAM PROGRESS NOTE  Admission History:  Ashlee Snow is an 61 y.o. female presenting from OSH with large subacute right cerebellar ischemic infarction with mass effect and pyelonephritis.   She presented to the emergency department for evaluation of confusion, right flank pain, and nausea with vomiting. Patient had reportedly been complaining of right flank pain for a couple weeks now, was otherwise well until the afternoon of 09/28/2017 when her pain increased fairly acutely and she developed nausea with nonbloody vomiting. She did not get out of bed the following day due to her illness and became quite confused by that evening.She had refused to go to the emergency department, but with her condition persisting today, her husband overrode her decision and she was taken to the ED.  [place] [place]:Upon arrival to the ED, patient is found to befebrile to 40.3 degree C, saturating well on room air, tachypneic to 30, tachycardic to 150, and with stable blood pressure. EKG features a sinus tachycardia with rate 141 and nonspecific T wave abnormality in the lateral leads. Chest x-ray is negative for acute cardiopulmonary disease. CT abdomen and pelvis is notable for right-sided hydronephrosis and proximal right hydroureter without definite calculus, raising concern for stricture/scar. Lactic acid was elevated to 11.9 the patient was treated with 2 L of lactated Ringer's and 2 g of IV Rocephin in the ED. A CT head was obtained at the OSH revealing a large subacute right cerebellar infarction with mass effect.   SUBJECTIVE (INTERVAL HISTORY)  Husband is at the bedside. Patient is found sitting in chair in NAD, .Again more interactive today but severely dysarthric speech. Awaiting swallow eval prior to deciding on diet versus NG tube for feeding. Serum sodium remains high despite hypertonic saline drip being off  OBJECTIVE Lab Results: CBC:  Recent Labs  Lab 10/02/17 0616  10/03/17 0716 10/04/17 0500  WBC 5.3 6.0 8.5  HGB 10.2* 11.6* 12.2  HCT 30.7* 35.6* 36.8  MCV 87.5 88.8 89.1  PLT 85* 96* 128*   BMP: Recent Labs  Lab 10/01/17 0317 10/01/17 0333  10/02/17 0616  10/02/17 1730 10/03/17 0017 10/03/17 0716 10/03/17 1017 10/03/17 1329 10/04/17 0500  NA  --  136   < > 151*  151*   < > 156* 158* 163*  --  164* 165*  K  --  3.4*  --  2.8*  --  3.0*  --   --  2.4*  --  2.7*  CL  --  107  --  121*  --   --   --   --   --   --  >130*  CO2  --  17*  --  18*  --   --   --   --   --   --  19*  GLUCOSE  --  153*  --  148*  --   --   --   --   --   --  124*  BUN  --  24*  --  20  --   --   --   --   --   --  27*  CREATININE  --  1.57*  --  1.30*  --   --   --   --   --   --  1.64*  CALCIUM  --  8.1*  --  7.5*  --   --   --   --   --   --  8.3*  MG 1.3*  --   --  2.2  --   --   --   --   --   --  2.1   < > = values in this interval not displayed.   Liver Function Tests:  Recent Labs  Lab 10/01/17 0333  AST 183*  ALT 72*  ALKPHOS 50  BILITOT 0.6  PROT 6.1*  ALBUMIN 2.7*   Recent Labs  Lab 10/01/17 0317  AMMONIA 36*   Cardiac Enzymes:  Recent Labs  Lab 10/01/17 0317 10/01/17 0420 10/01/17 1730  TROPONINI 1.04* 1.07* 0.58*   Coagulation Studies:  No results for input(s): APTT, INR in the last 72 hours. PHYSICAL EXAM Temp:  [98.3 F (36.8 C)-100.9 F (38.3 C)] 100.9 F (38.3 C) (12/12 0800) Pulse Rate:  [80-103] 88 (12/12 1030) Resp:  [14-29] 21 (12/12 1030) BP: (119-180)/(75-115) 138/82 (12/12 1030) SpO2:  [90 %-99 %] 93 % (12/12 1030) Weight:  [64.9 kg (143 lb 1.3 oz)] 64.9 kg (143 lb 1.3 oz) (12/12 0500) General - Well nourished, well developed,  Respiratory - Tachypneic. . Cardiovascular - Regular rate and rhythm   Neurologic Examination: Mental Status: Awake. Impaired attention- improving slowly. Speech output is minimal, dysarthric, fragmentary and barely intelligible. Able to say "water". Not oriented.  [place]ulic.  Fatigued-appearing. Does not fixate well on visual stimuli.  Cranial Nerves: II:  No blink to threat on the left. Intact blink to threat on the right. Right pupil 4 mm and reactive. Left pupil 6 mm and sluggishly reactive.  III,IV, VI: Saccadic dysmetria right greater than left but smooth pursuits bilaterally. Crosses midline bilaterally with conjugate EOM, but will not gaze fully to left or right. No nystagmus.  decreased blink to threat on the left compared to the right. V,VII: No definite facial droop. Reacts to tactile stimulation bilaterally.  VIII: Hearing intact to voice IX,X: Hypophonic speech.   XI: Head at midline XII: midline tongue extension  Motor/Sensory: RUE: Biceps, triceps, deltoid and grip 2-3/5 LUE: Biceps, triceps, deltoid and grip 2-3/5 RLE:   Withdraws from noxious with 3-4/5 strength.  LLE:   Withdraws from noxious with 2/5 strength.  Deep Tendon Reflexes:  Brisk, low-amplitude 3+ reflexes throughout. Toes equivocal bilaterally.  Cerebellar: unable to perform  Gait: not tested  IMAGING: I have personally reviewed the radiological images below and agree with the radiology interpretations. Ct Head Wo Contrast Result Date: 10/01/2017 IMPRESSION: No significant change since yesterday. Acute infarction affecting the right cerebellum with swelling. Mass-effect upon the fourth ventricle but no change in size the lateral or third ventricles at this time. No hemorrhage. 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 Electronically Signed   By: Paulina FusiMark  Shogry M.D.   On: 10/01/2017 07:45   MRI/MRA Brain Wo Contrast Result Date: 10/01/2017 IMPRESSION: 1. Acute/subacute inferior right cerebellum and vermis infarct with associated hemorrhage. 2. Additional scattered punctate foci of acute nonhemorrhagic infarct involving the right PCA territory with 1 lesion involving the right MCA territory along the right middle frontal gyrus. 3. Multiple foci of remote  encephalomalacia involving the inferior posterior right temporal lobe, lateral right temporal lobe, and high right parietal lobe. 4. 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 The right A1 and likely M1 segments fill via a patent anterior communicating artery. 5. Fenestration of the vertebrobasilar junction without significant basilar artery stenosis. 6. Marked attenuation of PCA branch vessels, worse on the right. Moderate proximal right PCA stenosis. 7. Fetal type left posterior cerebral artery.   Echocardiogram:  Study Conclusions - Left  ventricle: The cavity size was normal. Wall thickness was   increased in a pattern of mild LVH. Systolic function was normal.   The estimated ejection fraction was in the range of 60% to 65%.   Wall motion was normal; there were no regional wall motion   abnormalities. Doppler parameters are consistent with abnormal   left ventricular relaxation (grade 1 diastolic dysfunction). - Mitral valve: There was mild regurgitation.  B/L Carotid U/S:                                                Right ECA and ICA waveforms are very similar. Unable to interrogate stenosis.  40-59% left ICA stenosis. Bilateral vertebral artery flow is antegrade.   CT Head:         10/02/17                                                    1. No significant change in the appearance of acute to subacute, hemorrhagic right inferior cerebellar infarct with slight positive mass effect on the adjacent fourth ventricle. No dilatation the third or lateral ventricles. 2. Chronic right parietal, posteromedial temporal and occipital lobe infarcts as well as right periventricular white matter tract lacunar infarct.   EEG:   This normal EEG is recorded in the waking state                                                                 _____________________________________________________________________ ASSESSMENT: Ashlee Snow is a 61 y.o. female  with PMH of prior hypertension, coronary artery disease, hypothyroidism, chronic pain, depression, and peripheral arterial disease status post aorto-iliac bypass in June.   Acute, large Right cerebellar stroke with hemorrhage and mass effect Scattered punctate foci of acute nonhemorrhagic infarct -Right PCA territory Multiple foci of remote encephalomalacia - inferior posterior right temporal lobe, lateral right temporal lobe, and high right parietal lobe High-grade stenosis or occlusion of the cavernous right internal carotid artery.  Moderate proximal right PCA stenosis  Suspected Etiology:  Likely large vessel disease with high-grade terminal right ICA stenosis/occlusion and intracranial atherosclerosis  Resultant Symptoms:  Stroke Risk Factors: hyperlipidemia, hypertension and CAD, PVD Other Stroke Risk Factors: Advanced age, Cigarette smoker, Migraines   10/03/17:Patient has presented with multiple posterior circulation time right hemispheric infarcts likely due to significant intracranial and extracranial atherosclerosis. She also has significant history of peripheral vascular disease and has aortic stent grafting with possible infection. Check transesophageal echocardiogram for endocarditis as per ID. Long discussion with her husband and Dr. Wallace Cullens at the bedside and answered questions.   10/04/17: Neuro exam remains stable.  Patient continues to be encephalopathic, likely from elevated sodium.  Awake, follows most commands with delay, moving all extremities.  Continues to have left field cut and left-sided neglect.  Plan of care discussed with infectious disease team, critical care team and has been at bedside.  TEE scheduled for tomorrow and verified with cardiology scheduler. Will add ASA  PR for now and HOLD Plavix until DHT placed.  PLAN  10/04/2017: HOLD 3% - Na 165-  today ASA for now, Plavix on hold until DHT placed TEE Scheduled for AM 08/02/17 - requested by ID to R/O  endocarditis Neurosurgery consulted - no need for decomp surgery at this time. Appreciate recs Frequent neuro checks Telemetry monitoring Ongoing aggressive stroke risk factor management Follow up with [facility] in 6 weeks  Hypernatremia 3% sodium discontinued DHT to be inserted today and free water added every 6 hours We will continue to closely monitor repeat sodium labs  Encephalopathy Likely multifactorial- Improving slowly.  Elevated sodium likely contributing EEG - Negative  INTRACRANIAL Atherosclerosis &Stenosis: DAPT to be considered at discharge once patient improves  DYSPHAGIA: NPO after midnight tonight for scheduled TEE tomorrow SLP to re-evaluate swallow today If fails, DHT will be placed and feedings started today.   DHT will be placed due to scheduled TEE tomorrow  MULTIPLE MEDICAL ISSUES Management per CCM team  HYPERTENSION: Stable, some elevated B/P's noted overnight Permissive hypertension (OK if <220/120) for 24-48 hours post stroke and then gradually normalized within 5-7 days. Avoid SBP less than 130 Long term BP goal normotensive. May slowly restart home B/P medications after 48 hours Home Meds: Metoprolol, Lisinopril  HYPERLIPIDEMIA:    Component Value Date/Time   CHOL 147 10/01/2017 0420   TRIG 335 (H) 10/01/2017 0420   HDL 34 (L) 10/01/2017 0420   CHOLHDL 4.3 10/01/2017 0420   VLDL 67 (H) 10/01/2017 0420   LDLCALC 46 10/01/2017 0420  Home Meds:  Pravachol 20 mg LDL  goal < 70 HOLD Pravachol 20 mg daily for now, until LFT's improve Continue statin at discharge, if LFT's improve  DIABETES: Lab Results  Component Value Date   HGBA1C 6.3 (H) 10/01/2017  HgbA1c goal < 7.0 Currently ZO:XWRUEAVon:Novolog Continue CBG monitoring and SSI DM education when applicable   TOBACCO ABUSE Current smoker Smoking cessation counseling when applicable Nicotine patch PRN  Other Active Problems: Principal Problem:   Sepsis (HCC) Active  Problems:   Stroke (cerebrum) (HCC)   AKI (acute kidney injury) (HCC)   Obstructive pyelonephritis   Thrombocytopenia (HCC)   Hypokalemia   Lactic acidosis   Encephalopathy acute   MRSA bacteremia   Aspiration into airway  Hospital day # 4  VTE prophylaxis: SCD's  Diet : Diet NPO time specified Diet NPO time specified   Prior Home Stroke Medications: aspirin 81 mg daily and Pravachol 20 mg  Discharge Stroke Meds: Please discharge patient on TBD   Disposition: 01-Home or Self Care Therapy Recs:  CIR Follow Recs:  Follow-up Information    Micki RileySethi, Pramod S, MD. Schedule an appointment as soon as possible for a visit in 6 week(s).   Specialties:  Neurology, Radiology Contact information: [address] [place] [address] [phone]          Kirstie PeriShah, Ashish, MD- PCP follow up in 1-2 weeks after discharge  FAMILY UPDATES: Husband at bedside  TEAM UPDATES: Nelda BucksFeinstein, Daniel J, MD  Beryl MeagerMary A Ersie Savino, ANP-C Neurology Stroke Team  10/04/2017 12:23 PM I have personally examined this patient, reviewed notes, independently viewed imaging studies, participated in medical decision making and plan of care.ROS completed by me personally and pertinent positives fully documented  I have made any additions or clarifications directly to the above note. Agree with note above.  Recommend every 4 hours today if unable to pass swallow eval and and free water to bring  elevated sodium gradually down. Continue aspirin and Plavix for stroke prevention. Long discussion of the bedside with the patient's husband and answered questions. Discussed with Dr. Wallace Cullens and infectious disease team has practitioner Marcos Eke, NP This patient is critically ill and at significant risk of neurological worsening, death and care requires constant monitoring of vital signs, hemodynamics,respiratory and cardiac monitoring, extensive review of multiple databases, frequent neurological assessment, discussion  with family, other specialists and medical decision making of high complexity.I have made any additions or clarifications directly to the above note.This critical care time does not reflect procedure time, or teaching time or supervisory time of PA/NP/Med Resident etc but could involve care discussion time.  I spent 30 minutes of neurocritical care time  in the care of  this patient.     Delia Heady, MD Medical Director [facility] Pager: [phone] 10/04/2017 12:43 PM  To contact Stroke Continuity provider, please refer to [email or web address]. After hours, contact General Neurology

## 2017-10-04 NOTE — Progress Notes (Signed)
Cortrak Tube Team Note:  Consult received to place a Cortrak feeding tube.   A 10 F Cortrak tube was placed in the R nare nare and secured with a nasal tape/clip at 75 cm. Per the Cortrak monitor reading the tube tip is post-pyloric. Bridle stylet was unable to be passed through the L nare so tape/clip was needed for securing the tube in place; RN aware. Plan for TEE tomorrow which will necessitate removal of Cortrak tube; plan for replacement on Friday (12/14).  No x-ray is required. RN may begin using tube.    If the tube becomes dislodged please keep the tube and contact the Cortrak team at [email or web address] (password [email or web address]) for replacement.  If after hours and replacement cannot be delayed, place a NG tube and confirm placement with an abdominal x-ray.    Trenton GammonJessica Anjelina Dung, MS, RD, LDN, [facility] Inpatient Clinical Dietitian Pager # [phone] After hours/weekend pager # [phone]

## 2017-10-04 NOTE — Progress Notes (Signed)
Physical Therapy Treatment Patient Details Name: Ashlee Snow A Hearld MRN: [id number] DOB: September 03, 1956 Today's Date: 10/04/2017    History of Present Illness This is a 61 year old with a past medical history of fibromyalgia, hypertension, and hypothyroidism who for several days prior to admission had apparently been suffering from  Confusion, fevers, N/V, right flank pain,. Found to have Severe sepsis secondary to pyelonephritis with ureteral obstruction and MRI: Acute/subacute inferior right cerebellum and vermis infarct with associated hemorrhage. Additional scattered punctate foci of acute nonhemorrhagic infarct involving the right PCA territory with 1 lesion involving the right MCA territory along the right middle frontal gyrus. Multiple foci of remote encephalomalacia involving the inferior posterior right temporal lobe, lateral right temporal lobe, and high right parietal lobe    PT Comments    Pt con't to progress towards goals, pt more verbal and attentive to task. Pt able to initiate rolling, sit to sidelying, and sit to stand transfer. Pt remains to have L sided neglect and weakness, impaired balance, and impaired sequencing/processing. Pt remains appropriate for CIR upon d/c. Acute PT to con't to follow.    Follow Up Recommendations  CIR     Equipment Recommendations  None recommended by PT    Recommendations for Other Services Rehab consult     Precautions / Restrictions Precautions Precautions: Fall Restrictions Weight Bearing Restrictions: No    Mobility  Bed Mobility Overal bed mobility: Needs Assistance Bed Mobility: Rolling;Sidelying to Sit Rolling: Min assist Sidelying to sit: Mod assist;+2 for physical assistance       General bed mobility comments: pt initiated rolling to the R and pushing up with L UE/ R elbow to obtain sitting EOB  Transfers Overall transfer level: Needs assistance Equipment used: (2 person lift with gait belt and bed pad) Transfers: Sit  to/from [employer] CorporationStand;Stand Pivot Transfers Sit to Stand: Mod assist;+2 physical assistance Stand pivot transfers: Mod assist;+2 physical assistance       General transfer comment: pt with improved ability with powering up, pt unsteady however able to advance LEs with modA to chair  Ambulation/Gait             General Gait Details: unable   Stairs            Wheelchair Mobility    Modified Rankin (Stroke Patients Only) Modified Rankin (Stroke Patients Only) Pre-Morbid Rankin Score: No significant disability Modified Rankin: Severe disability     Balance Overall balance assessment: Needs assistance Sitting-balance support: No upper extremity supported Sitting balance-Leahy Scale: Fair Sitting balance - Comments: pt able to tolerate sitting EOB without UE assist of physical assist x 2 min   Standing balance support: Bilateral upper extremity supported Standing balance-Leahy Scale: Poor                              Cognition Arousal/Alertness: Awake/alert Behavior During Therapy: Flat affect Overall Cognitive Status: Impaired/Different from baseline Area of Impairment: Following commands;Safety/judgement;Awareness;Problem solving                   Current Attention Level: Sustained   Following Commands: Follows one step commands with increased time Safety/Judgement: Decreased awareness of safety;Decreased awareness of deficits Awareness: Intellectual Problem Solving: Slow processing;Decreased initiation;Difficulty sequencing;Requires verbal cues;Requires tactile cues General Comments: pt initiated transfers this date to command      Exercises General Exercises - Lower Extremity Ankle Circles/Pumps: Strengthening;Both;10 reps;Seated Long Arc Quad: AROM;Both;10 reps;Seated Hip Flexion/Marching: PROM;Left;10 reps;Seated  General Comments        Pertinent Vitals/Pain Pain Assessment: Faces Faces Pain Scale: No hurt Pain Intervention(s):  Monitored during session    Home Living                      Prior Function            PT Goals (current goals can now be found in the care plan section) Progress towards PT goals: Progressing toward goals    Frequency    Min 4X/week      PT Plan Current plan remains appropriate    Co-evaluation              AM-PAC PT "6 Clicks" Daily Activity  Outcome Measure  Difficulty turning over in bed (including adjusting bedclothes, sheets and blankets)?: Unable Difficulty moving from lying on back to sitting on the side of the bed? : Unable Difficulty sitting down on and standing up from a chair with arms (e.g., wheelchair, bedside commode, etc,.)?: Unable Help needed moving to and from a bed to chair (including a wheelchair)?: Total Help needed walking in hospital room?: Total Help needed climbing 3-5 steps with a railing? : Total 6 Click Score: 6    End of Session Equipment Utilized During Treatment: Oxygen Activity Tolerance: Patient tolerated treatment well Patient left: in chair;with call bell/phone within reach;with chair alarm set;with family/visitor present Nurse Communication: Mobility status PT Visit Diagnosis: Unsteadiness on feet (R26.81);Other symptoms and signs involving the nervous system (Z61.096(R29.898)     Time: [id number] PT Time Calculation (min) (ACUTE ONLY): 22 min  Charges:  $Neuromuscular Re-education: 8-22 mins                    G Codes:       Lewis ShockAshly Kelechi Orgeron, PT, DPT Pager #: [phone] Office #: [phone]    Mikalah Skyles M Murdis Flitton 10/04/2017, 1:46 PM

## 2017-10-04 NOTE — Progress Notes (Signed)
Cortrak Tube Team Note:  Paged received from RN who reports that pt pulled Cortrak out. Original tube was retained.   This 10 F Cortrak tube was able to be replaced in R nare and this time was able to be secured with a nasal bridle at 79 cm. Per the Cortrak monitor reading the tube tip is post-pyloric.   No x-ray is required. RN may begin using tube.   If the tube becomes dislodged please keep the tube and contact the Cortrak team at [email or web address] (password [email or web address]) for replacement.  If after hours and replacement cannot be delayed, place a NG tube and confirm placement with an abdominal x-ray.      Ashlee GammonJessica Eduin Friedel, MS, RD, LDN, [facility] Inpatient Clinical Dietitian Pager # [phone] After hours/weekend pager # [phone]

## 2017-10-04 NOTE — H&P (View-Only) (Signed)
[facility]  Date of Admission:  09/27/2017     Total days of antibiotics 5  Nafcillin Day 2 Levofloxacin Day 2 Meropenem 3 days (Stopped 12/10) Vancomycin 2 days (Stopped 12.09)         ASSESSMENT/PLAN  Right Cerebellar Stroke with Mass Effect - Stable and appears to be improving. Per stroke team suspected etiology from large vessel disease with high-grade terminal right ICA stenosis. Question septic emboli as contributing source. Hypertonic saline stopped yesterday. Awaiting TEE.   MSSA Bacteremia - No leukocytosis. PICC line to be removed. Repeat blood cultures pending. Source of infection remains unknown with pending TEE. If MSSA found in urine would likely be from systemic source. Will change her naf to zosyn for now due to fever o/n. (could be due to CVA?) Consider removing PIC if fever persists.  Family updated.   Principal Problem:   Sepsis (HCC) Active Problems:   Stroke (cerebrum) (HCC)   AKI (acute kidney injury) (HCC)   Obstructive pyelonephritis   Thrombocytopenia (HCC)   Hypokalemia   Lactic acidosis   Encephalopathy acute   MRSA bacteremia   .  stroke: mapping our early stages of recovery book   Does not apply Once  . chlorhexidine  15 mL Mouth Rinse BID  . Chlorhexidine Gluconate Cloth  6 each Topical Daily  . enalaprilat  0.625 mg Intravenous Q6H  . insulin aspart  0-9 Units Subcutaneous Q4H  . ipratropium-albuterol  3 mL Nebulization QID  . levothyroxine  37.5 mcg Intravenous Daily  . mouth rinse  15 mL Mouth Rinse q12n4p  . pantoprazole (PROTONIX) IV  40 mg Intravenous Q24H  . potassium chloride  20 mEq Oral Daily  . sodium chloride flush  10-40 mL Intracatheter Q12H  . thiamine injection  100 mg Intravenous Daily    SUBJECTIVE:  Blood cultures from 12/11 pending. Continues on nafcillin and levofloxacin.    Allergies  Allergen Reactions  . Ativan [Lorazepam] Other (See Comments)    Patient's husband said related  to being oversedated not true allergy     Review of Systems: Review of Systems  Unable to perform ROS: Critical illness      OBJECTIVE: Vitals:   10/04/17 0600 10/04/17 0630 10/04/17 0700 10/04/17 0742  BP: (!) 180/111 (!) 158/82 (!) 160/85   Pulse: 92 88 81   Resp: (!) 24 20 20    Temp:      TempSrc:      SpO2: 94% 93% 95% 95%  Weight:      Height:       Body mass index is 24.56 kg/m.  Physical Exam  Constitutional: She appears lethargic. She appears to not be writhing in pain. She has a sickly appearance. No distress.  Cardiovascular: Normal rate, regular rhythm and normal heart sounds.  Pulmonary/Chest: Breath sounds normal. Tachypnea noted.  Abdominal: Soft. Bowel sounds are normal. There is no tenderness.  Musculoskeletal:       Arms: Neurological: She appears lethargic.  Skin: She is not diaphoretic.    Lab Results Lab Results  Component Value Date   WBC 8.5 10/04/2017   HGB 12.2 10/04/2017   HCT 36.8 10/04/2017   MCV 89.1 10/04/2017   PLT 128 (L) 10/04/2017    Lab Results  Component Value Date   CREATININE 1.64 (H) 10/04/2017   BUN 27 (H) 10/04/2017   NA 165 (HH) 10/04/2017   K 2.7 (LL) 10/04/2017   CL >130 (HH) 10/04/2017   CO2 19 (  L) 10/04/2017    Lab Results  Component Value Date   ALT 72 (H) 10/01/2017   AST 183 (H) 10/01/2017   ALKPHOS 50 10/01/2017   BILITOT 0.6 10/01/2017     Microbiology: Recent Results (from the past 240 hour(s))  MRSA PCR Screening     Status: None   Collection Time: 10/07/2017  7:52 PM  Result Value Ref Range Status   MRSA by PCR NEGATIVE NEGATIVE Final    Comment:        The GeneXpert MRSA Assay (FDA approved for NASAL specimens only), is one component of a comprehensive MRSA colonization surveillance program. It is not intended to diagnose MRSA infection nor to guide or monitor treatment for MRSA infections.   Culture, blood (x 2)     Status: Abnormal   Collection Time: 09/29/2017  8:35 PM  Result Value  Ref Range Status   Specimen Description BLOOD LEFT ANTECUBITAL  Final   Special Requests IN PEDIATRIC BOTTLE Blood Culture adequate volume  Final   Culture  Setup Time   Final    GRAM POSITIVE COCCI IN CLUSTERS IN PEDIATRIC BOTTLE CRITICAL VALUE NOTED.  VALUE IS CONSISTENT WITH PREVIOUSLY REPORTED AND CALLED VALUE.    Culture (A)  Final    STAPHYLOCOCCUS AUREUS SUSCEPTIBILITIES PERFORMED ON PREVIOUS CULTURE WITHIN THE LAST 5 DAYS.    Report Status 10/04/2017 FINAL  Final  Culture, blood (x 2)     Status: Abnormal   Collection Time: 10/01/2017  8:40 PM  Result Value Ref Range Status   Specimen Description BLOOD LEFT ANTECUBITAL  Final   Special Requests IN PEDIATRIC BOTTLE Blood Culture adequate volume  Final   Culture  Setup Time   Final    GRAM POSITIVE COCCI IN CLUSTERS IN PEDIATRIC BOTTLE CRITICAL RESULT CALLED TO, READ BACK BY AND VERIFIED WITH: B MANCHERIL,PHARMD AT 0825 10/02/17 BY L BENFIELD    Culture STAPHYLOCOCCUS AUREUS (A)  Final   Report Status 10/04/2017 FINAL  Final   Organism ID, Bacteria STAPHYLOCOCCUS AUREUS  Final      Susceptibility   Staphylococcus aureus - MIC*    CIPROFLOXACIN <=0.5 SENSITIVE Sensitive     ERYTHROMYCIN >=8 RESISTANT Resistant     GENTAMICIN <=0.5 SENSITIVE Sensitive     OXACILLIN <=0.25 SENSITIVE Sensitive     TETRACYCLINE <=1 SENSITIVE Sensitive     VANCOMYCIN <=0.5 SENSITIVE Sensitive     TRIMETH/SULFA <=10 SENSITIVE Sensitive     CLINDAMYCIN RESISTANT Resistant     RIFAMPIN <=0.5 SENSITIVE Sensitive     Inducible Clindamycin POSITIVE Resistant     * STAPHYLOCOCCUS AUREUS  Blood Culture ID Panel (Reflexed)     Status: Abnormal   Collection Time: 10/12/2017  8:40 PM  Result Value Ref Range Status   Enterococcus species NOT DETECTED NOT DETECTED Final   Listeria monocytogenes NOT DETECTED NOT DETECTED Final   Staphylococcus species DETECTED (A) NOT DETECTED Final    Comment: CRITICAL RESULT CALLED TO, READ BACK BY AND VERIFIED WITH: B  MANCHERIL,PHARMD AT 0825 10/02/17 BY L BENFIELD    Staphylococcus aureus DETECTED (A) NOT DETECTED Final    Comment: Methicillin (oxacillin) susceptible Staphylococcus aureus (MSSA). Preferred therapy is anti staphylococcal beta lactam antibiotic (Cefazolin or Nafcillin), unless clinically contraindicated. CRITICAL RESULT CALLED TO, READ BACK BY AND VERIFIED WITH: B MANCHERIL,PHARMD AT 0825 10/02/17 BY L BENFIELD    Methicillin resistance NOT DETECTED NOT DETECTED Final   Streptococcus species NOT DETECTED NOT DETECTED Final   Streptococcus agalactiae NOT DETECTED NOT  DETECTED Final   Streptococcus pneumoniae NOT DETECTED NOT DETECTED Final   Streptococcus pyogenes NOT DETECTED NOT DETECTED Final   Acinetobacter baumannii NOT DETECTED NOT DETECTED Final   Enterobacteriaceae species NOT DETECTED NOT DETECTED Final   Enterobacter cloacae complex NOT DETECTED NOT DETECTED Final   Escherichia coli NOT DETECTED NOT DETECTED Final   Klebsiella oxytoca NOT DETECTED NOT DETECTED Final   Klebsiella pneumoniae NOT DETECTED NOT DETECTED Final   Proteus species NOT DETECTED NOT DETECTED Final   Serratia marcescens NOT DETECTED NOT DETECTED Final   Haemophilus influenzae NOT DETECTED NOT DETECTED Final   Neisseria meningitidis NOT DETECTED NOT DETECTED Final   Pseudomonas aeruginosa NOT DETECTED NOT DETECTED Final   Candida albicans NOT DETECTED NOT DETECTED Final   Candida glabrata NOT DETECTED NOT DETECTED Final   Candida krusei NOT DETECTED NOT DETECTED Final   Candida parapsilosis NOT DETECTED NOT DETECTED Final   Candida tropicalis NOT DETECTED NOT DETECTED Final     Marcos Eke, NP [facility] [phone] Pager  10/04/2017  9:20 AM

## 2017-10-04 NOTE — Progress Notes (Signed)
PULMONARY / CRITICAL CARE MEDICINE   Name: Ashlee Snow MRN: [id number] DOB: 02/15/1956    ADMISSION DATE:  09/24/2017   CHIEF COMPLAINT: Fever and nausea  HISTORY OF PRESENT ILLNESS:   This is a 61 year old with a past medical history of fibromyalgia, hypertension, and hypothyroidism who for several days prior to admission had apparently been suffering from nausea and vomiting.  She subsequently had fevers and was evaluated at an outside hospital where she was found to have left hydronephrosis.  A ureteral stent was placed and she was treated for urosepsis with antibiotics and generous fluids.  She has grown MSSA from her blood.  We have no urine culture.  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   Her oxygen requirements increased substantially on 12/10 and Levaquin  was added to nafcillin to cover for presumed aspiration.  She had worsening rhonchi bilaterally but predominantly on the left.   Today her oxygen requirements have dropped back down and she is only requiring a 2 L nasal cannula.  She is more verbal but still not producing coherent speech.  She is following instructions.  PAST MEDICAL HISTORY :  She  has a past medical history of Asthma, Bronchitis, DDD (degenerative disc disease), Depression, Dyspnea, Fibromyalgia, GERD (gastroesophageal reflux disease), History of depression, Hypertension, Hypothyroidism, Leg pain, Migraine, Peripheral arterial disease (HCC), Peripheral vascular disease (HCC), Sciatica, Stroke (cerebrum) (HCC) (10/04/2017), Thyroid disease, TMJ (temporomandibular joint disorder) (08/24/1991), and Ulcer.  PAST SURGICAL HISTORY: She  has a past surgical history that includes Cholecystectomy (01/22/1997); Abdominal hysterectomy; Ovarian cyst surgery; aortogram w/ stenting (2007, 2011, Aug. 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  Allergies  Allergen Reactions  . Ativan [Lorazepam] Other (See Comments)    Patient's husband said related to being oversedated not true allergy    No current facility-administered medications on file prior to encounter.    Current Outpatient Medications on File Prior to Encounter  Medication Sig  . BLACK COHOSH PO Take 3 capsules by mouth daily.   . ergocalciferol (VITAMIN D2) 50000 units capsule Take 50,000 Units by mouth once a week. Monday  . HM ASPIRIN 81 MG chewable tablet Chew 81 mg by mouth 2 (two) times daily.   Marland Kitchen. levothyroxine (SYNTHROID, LEVOTHROID) 75 MCG tablet Take 75 mcg by mouth daily before breakfast.  . LINZESS 145 MCG CAPS capsule Take 145 mcg by mouth daily.  Marland Kitchen. lisinopril (PRINIVIL,ZESTRIL) 20 MG tablet Take 20 mg by mouth every morning.   Marland Kitchen. LYRICA 150 MG capsule Take 150 mg by mouth 2 (two) times daily.  . methocarbamol (ROBAXIN) 750 MG tablet Take 750 mg by mouth 3 (three) times daily.   . metoprolol succinate (TOPROL-XL) 25 MG 24 hr tablet Take 25 mg by mouth every morning.  Marland Kitchen. oxyCODONE-acetaminophen (PERCOCET) 10-325 MG tablet Take 1 tablet by mouth every 6 (six) hours as needed for pain. (Patient taking differently: Take 1 tablet by mouth 4 (four) times daily. )  . pravastatin (PRAVACHOL) 20 MG tablet Take 20 mg by mouth every morning.   . promethazine (PHENERGAN) 25 MG tablet Take 1 tablet by mouth every 6 (six) hours as needed.  . venlafaxine XR (EFFEXOR-XR) 75 MG 24 hr capsule Take 150 mg every morning by mouth.     FAMILY HISTORY:  Her indicated that her mother is deceased. She indicated that her father is  deceased. She indicated that her sister is deceased. She indicated that the status of her neg hx is unknown.   SOCIAL HISTORY: She  reports that she has been smoking e-cigarettes.  She started smoking about 42 years ago. She has been smoking about 1.00 pack per day. she has never used  smokeless tobacco. She reports that she does not drink alcohol or use drugs.  REVIEW OF SYSTEMS:   Unobtainable  SUBJECTIVE:  Unobtainable  VITAL SIGNS: BP (!) 158/82   Pulse 88   Temp 98.3 F (36.8 C) (Oral)   Resp 20   Ht 5\' 4"  (1.626 m)   Wt 143 lb 1.3 oz (64.9 kg)   SpO2 93%   BMI 24.56 kg/m       INTAKE / OUTPUT: I/O last 3 completed shifts: In: 1368.3 [I.V.:668.3; IV Piggyback:700] Out: 2765 [Urine:2765]  PHYSICAL EXAMINATION: General: She alert and does follow instructions.   Neuro: Phonates occasionally but does not compose sentences.  Pupils are equal today.  She does not have exaggerated nystagmus.  The face is symmetric.  Moves all fours on request. Cardiovascular: S1 and S2 are regular without murmur rub or gallop. Lungs: Respirations are unlabored.  There are far fewer rhonchi on the left.   Air movement is symmetrical. Abdomen: Abdomen is soft without organomegaly masses tenderness guarding or rebound.  LABS:  BMET Recent Labs  Lab 10/01/17 0333  10/02/17 0616  10/02/17 1730  10/03/17 0716 10/03/17 1017 10/03/17 1329 10/04/17 0500  NA 136   < > 151*  151*   < > 156*   < > 163*  --  164* 165*  K 3.4*  --  2.8*  --  3.0*  --   --  2.4*  --  2.7*  CL 107  --  121*  --   --   --   --   --   --  >130*  CO2 17*  --  18*  --   --   --   --   --   --  19*  BUN 24*  --  20  --   --   --   --   --   --  27*  CREATININE 1.57*  --  1.30*  --   --   --   --   --   --  1.64*  GLUCOSE 153*  --  148*  --   --   --   --   --   --  124*   < > = values in this interval not displayed.    Electrolytes Recent Labs  Lab 10/01/17 0317 10/01/17 0333 10/02/17 0616 10/04/17 0500  CALCIUM  --  8.1* 7.5* 8.3*  MG 1.3*  --  2.2 2.1    CBC Recent Labs  Lab 10/02/17 0616 10/03/17 0716 10/04/17 0500  WBC 5.3 6.0 8.5  HGB 10.2* 11.6* 12.2  HCT 30.7* 35.6* 36.8  PLT 85* 96* 128*    Coag's Recent Labs  Lab 10/08/2017 2104  APTT 33  INR 1.08    Sepsis  Markers Recent Labs  Lab 10/01/17 0333 10/01/17 0420 10/02/17 0839 10/02/17 1137 10/03/17 0017 10/03/17 1017  LATICACIDVEN  --  3.1* 1.6 1.4  --   --   PROCALCITON 41.84  --   --   --  16.53 12.94    ABG No results for input(s): PHART, PCO2ART, PO2ART in the last 168 hours.  Liver Enzymes Recent Labs  Lab 10/01/17  0333  AST 183*  ALT 72*  ALKPHOS 50  BILITOT 0.6  ALBUMIN 2.7*    Cardiac Enzymes Recent Labs  Lab 10/01/17 0317 10/01/17 0420 10/01/17 1730  TROPONINI 1.04* 1.07* 0.58*    Glucose Recent Labs  Lab 10/03/17 0818 10/03/17 1222 10/03/17 1550 10/03/17 1948 10/04/17 0000 10/04/17 0402  GLUCAP 125* 122* 115* 140* 98 101*    Imaging Dg Chest Port 1 View  Result Date: 10/04/2017 CLINICAL DATA:  Aspiration EXAM: PORTABLE CHEST 1 VIEW COMPARISON:  October 02, 2017 FINDINGS: Central catheter tip is in the superior vena cava. No pneumothorax. There is patchy airspace consolidation in the left base with small left pleural effusion. Lungs elsewhere are clear. Heart size and pulmonary vascularity are normal. No adenopathy. There is aortic atherosclerosis. No bone lesions. IMPRESSION: No pneumothorax. Left base infiltrate consistent with pneumonia. Small left pleural effusion. No new opacity. Stable cardiac silhouette. There is aortic atherosclerosis. Aortic Atherosclerosis (ICD10-I70.0). Electronically Signed   By: Bretta BangWilliam  Woodruff III M.D.   On: 10/04/2017 07:22       ANTIBIOTICS: Levaquin and nafcillin   DISCUSSION: This is a 61 year old with a history of hypertension, fibromyalgia, peripheral vascular disease, and prior CVA who was treated for a UTI and hydronephrosis with placement of a left ureteral stent.  She had persistent difficulties with nausea and vomiting and a head CT has shown a right cerebellar infarct.  She has grown MSSA from her blood.  ASSESSMENT / PLAN:  PULMONARY A: He deteriorated overnight from a respiratory standpoint and I am  again concerned about the possibility of aspiration.  Her pro-calcitonin is up and I added Levaquin to her nafcillin to better cover potential aspiration.    CARDIOVASCULAR A: We are probably more than 96 hours out from her CVA and I to need to try to bring her hypertension into better control.  She will have a feeding tube placed later today and I will put her back on her usual dose of ACE inhibitor.      INFECTIOUS A: Urinary tract infection, and subsequently likely aspiration.  I am going to continue her on a combination of nafcillin for the MSSA in her blood and Levaquin.  I am a little concerned that MSSA is a unusual organism for urinary tract infection and we did not isolate that organism from the urine.  Also concerned that she has had bypass surgery in the past and likely has a Gore-Tex graft in place.  She may require protracted therapy for her staph bacteremia, obviating the need for TEE to rule out endocarditis as she is likely going to require protracted therapy anyhow.   I am awaiting sensitivity on the staph to determine whether or not we can consolidate her antibiotic treatment to cover both aspiration and the MSSA.   NEUROLOGIC A: She is at least starting to phonate some which is an improvement. Unfortunately at this point I think her infectious issues are contributing to her decreased sensorium.  Continuing to treat her infections as noted.  I am also somewhat concerned that she has a pancytopenia and although the history does not report alcoholism I have added thiamine.    Penny PiaWJ Stephanie Mcglone, MD Pulmonary and Critical Care Medicine [facility] Pager: [phone]  10/04/2017, 7:28 AM

## 2017-10-04 NOTE — Progress Notes (Signed)
[facility]  [facility] infusion Coordinator will follow pt with ID team to support home IV ABX at DC if home is planned DC location.   If patient discharges after hours, please call [phone].   Sedalia Mutaamela S Chandler 10/04/2017, 10:45 AM

## 2017-10-04 NOTE — Progress Notes (Signed)
Nutrition Follow-up  INTERVENTION:   Once tube in place: Osmolite 1.2 @ 55 ml/hr (1320 ml/day) 30 ml Prostat daily Provides: 1684 kcal, 88 grams protein, and 1070 ml free water.   NUTRITION DIAGNOSIS:   Inadequate oral intake related to inability to eat as evidenced by NPO status. Ongoing.    GOAL:   Patient will meet greater than or equal to 90% of their needs Progressing.   MONITOR:   Diet advancement, I & O's  REASON FOR ASSESSMENT:   Consult Enteral/tube feeding initiation and management  ASSESSMENT:   Pt with PMH of fibromyalgia, HTN, hypothyroidism who was recent having N/V and treated for urosepsis. 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  Pt discussed during ICU rounds and with RN.  Plan for Cortrak today, planned TEE tomorrow to rule out endocarditis.     Medications reviewed and include: KCl, synthroid, thiamine Labs reviewed: Na 165 (H) - 3% d/c'ed 12/10, K+ 2.7 (L), Cl > 130 (H) CBG's: 101-109-118    Diet Order:  Diet NPO time specified Diet NPO time specified  EDUCATION NEEDS:   No education needs have been identified at this time  Skin:  Skin Assessment: Reviewed RN Assessment  Last BM:  12/8  Height:   Ht Readings from Last 1 Encounters:  10/15/2017 5\' 4"  (1.626 m)    Weight:   Wt Readings from Last 1 Encounters:  10/04/17 143 lb 1.3 oz (64.9 kg)    Ideal Body Weight:  54.5 kg  BMI:  Body mass index is 24.56 kg/m.  Estimated Nutritional Needs:   Kcal:  1600-1800  Protein:  75-90 grams  Fluid:  >1.6 L/day  Kendell BaneHeather Ticia Virgo RD, LDN, CNSC [phone] Pager [phone] After Hours Pager

## 2017-10-04 NOTE — Progress Notes (Signed)
Occupational Therapy Treatment Patient Details Name: Ashlee Snow A Ladouceur MRN: [id number] DOB: 05/15/1956 Today's Date: 10/04/2017    History of present illness This is a 61 year old with a past medical history of fibromyalgia, hypertension, and hypothyroidism who for several days prior to admission had apparently been suffering from  Confusion, fevers, N/V, right flank pain,. Found to have Severe sepsis secondary to pyelonephritis with ureteral obstruction and MRI: Acute/subacute inferior right cerebellum and vermis infarct with associated hemorrhage. Additional scattered punctate foci of acute nonhemorrhagic infarct involving the right PCA territory with 1 lesion involving the right MCA territory along the right middle frontal gyrus. Multiple foci of remote encephalomalacia involving the inferior posterior right temporal lobe, lateral right temporal lobe, and high right parietal lobe   OT comments  Pt progressing towards established OT goals. Pt performing oral care with supported sitting in recliner. Pt requiring Max A to initiate task and facilitate grasp of swab and back and forth motion for brushing. Pt continues to present with significant R eye gaze and head tilt. Pt requiring Max A +2 for stand pivot and to return to supine in bed. Will continue to follow acutely. Continue to recommend dc to CIR for intensive OT.   Follow Up Recommendations  CIR;Supervision/Assistance - 24 hour    Equipment Recommendations  Other (comment)(TBD at next venue)    Recommendations for Other Services Rehab consult    Precautions / Restrictions Precautions Precautions: Fall Restrictions Weight Bearing Restrictions: No       Mobility Bed Mobility Overal bed mobility: Needs Assistance Bed Mobility: Sit to Sidelying;Rolling Rolling: Min assist Sidelying to sit: Mod assist;+2 for physical assistance     Sit to sidelying: +2 for physical assistance;Max assist General bed mobility comments: Pt requiring  Max A to manage BLEs. Pt initaiting descent of trunk and head towards pillow  Transfers Overall transfer level: Needs assistance Equipment used: (2 person lift with gait belt and bed pad) Transfers: Sit to/from BJ'sStand;Stand Pivot Transfers Sit to Stand: Mod assist;+2 physical assistance Stand pivot transfers: +2 physical assistance;Max assist       General transfer comment: Pt requirng Mod A for sit<>stand and Max A +2 for stand pivot to EOB. Pt fatigued from sitting in recliner.     Balance Overall balance assessment: Needs assistance Sitting-balance support: No upper extremity supported Sitting balance-Leahy Scale: Fair Sitting balance - Comments: pt able to tolerate sitting EOB without UE assist of physical assist x 2 min Postural control: Right lateral lean;Posterior lean Standing balance support: Bilateral upper extremity supported;During functional activity Standing balance-Leahy Scale: Poor                             ADL either performed or assessed with clinical judgement   ADL Overall ADL's : Needs assistance/impaired     Grooming: Maximal assistance;Sitting;Cueing for sequencing;Cueing for safety;Oral care Grooming Details (indicate cue type and reason): Pt performing oral care with supported sitting in recliner. Pt requiring Max A to intitiation hold of mouth swab and bring hand to mouth. Providing support at elbow and hand over hand to grasp. Once initaitioned pt performing back and forth movements to brush tongue and teeth. Pt requiring tactile and verbal cues to attend to L side and wash L side of mouth. Pt needing Max cues to terminate task             Lower Body Dressing: Total assistance Lower Body Dressing Details (indicate cue type and reason):  total A to manage socks             Functional mobility during ADLs: +2 for physical assistance;Maximal assistance;Cueing for sequencing;Cueing for safety General ADL Comments: Pt performing oral care  while supported in sitting. Focused ADLs on attention, problem solving, and attending to L side of visual field. Pt requiring Max cues to attend to L side. Pt requiring Max A +2 to stand pivot to EOB and return to supine. Pt with significant R eye gaze and head turn preferance     Vision       Perception     Praxis      Cognition Arousal/Alertness: Awake/alert Behavior During Therapy: Flat affect Overall Cognitive Status: Impaired/Different from baseline Area of Impairment: Following commands;Safety/judgement;Awareness;Problem solving;Attention                   Current Attention Level: Sustained   Following Commands: Follows one step commands with increased time Safety/Judgement: Decreased awareness of safety;Decreased awareness of deficits Awareness: Intellectual Problem Solving: Slow processing;Decreased initiation;Difficulty sequencing;Requires verbal cues;Requires tactile cues General Comments: Pt requiring increased time and cues for problem sovling, following commands, and attending to task. Pt requiring increased verbal and tactile cues to termination oral care. Pt with significant R eye gaze.         Exercises    Shoulder Instructions       General Comments RN present throughout session    Pertinent Vitals/ Pain       Pain Assessment: Faces Faces Pain Scale: No hurt Pain Intervention(s): Monitored during session  Home Living                                          Prior Functioning/Environment              Frequency  Min 3X/week        Progress Toward Goals  OT Goals(current goals can now be found in the care plan section)  Progress towards OT goals: Progressing toward goals  Acute Rehab OT Goals Patient Stated Goal: husband "I would like for her to be able to walk out of her, but I also realize that probably is not going to happen" OT Goal Formulation: With family Time For Goal Achievement: 10/16/17 Potential to  Achieve Goals: Good ADL Goals Pt Will Perform Grooming: with mod assist Pt Will Transfer to Toilet: with mod assist;with +2 assist;bedside commode;squat pivot transfer;stand pivot transfer Additional ADL Goal #1: Pt will follow 4 out of 5 one step commands without delay Additional ADL Goal #2: Pt will roll left and right with Mod A to A with basic ADLs Additional ADL Goal #3: Pt will be able to track left of midline in supine and supported sitting to work on scanning Additional ADL Goal #4: Pt will be able to sit EOB for 5 minutes with no more than Mod A as precursor to basic ADLs in an unsupported seated position  Plan Discharge plan remains appropriate    Co-evaluation                 AM-PAC PT "6 Clicks" Daily Activity     Outcome Measure   Help from another person eating meals?: Total Help from another person taking care of personal grooming?: A Lot Help from another person toileting, which includes using toliet, bedpan, or urinal?: Total Help from another person bathing (including washing,  rinsing, drying)?: Total Help from another person to put on and taking off regular upper body clothing?: Total Help from another person to put on and taking off regular lower body clothing?: Total 6 Click Score: 7    End of Session Equipment Utilized During Treatment: Gait belt  OT Visit Diagnosis: Other abnormalities of gait and mobility (R26.89);Muscle weakness (generalized) (M62.81);Other symptoms and signs involving the nervous system (R29.898);Other symptoms and signs involving cognitive function;Cognitive communication deficit (R41.841);Hemiplegia and hemiparesis;Pain Symptoms and signs involving cognitive functions: Cerebral infarction Hemiplegia - Right/Left: Left Hemiplegia - dominant/non-dominant: Non-Dominant Hemiplegia - caused by: Cerebral infarction Pain - Right/Left: Right Pain - part of body: Arm(also low back)   Activity Tolerance Patient tolerated treatment well    Patient Left in bed;with bed alarm set;with nursing/sitter in room   Nurse Communication Mobility status        Time: 4098-1191 OT Time Calculation (min): 22 min  Charges: OT General Charges $OT Visit: 1 Visit OT Treatments $Self Care/Home Management : 8-22 mins  Rai Severns MSOT, OTR/L Acute Rehab Pager: [phone] Office: [phone]   Theodoro Grist Yecheskel Kurek 10/04/2017, 4:41 PM

## 2017-10-04 NOTE — Progress Notes (Signed)
[facility]  Date of Admission:  09/27/2017     Total days of antibiotics 5  Nafcillin Day 2 Levofloxacin Day 2 Meropenem 3 days (Stopped 12/10) Vancomycin 2 days (Stopped 12.09)         ASSESSMENT/PLAN  Right Cerebellar Stroke with Mass Effect - Stable and appears to be improving. Per stroke team suspected etiology from large vessel disease with high-grade terminal right ICA stenosis. Question septic emboli as contributing source. Hypertonic saline stopped yesterday. Awaiting TEE.   MSSA Bacteremia - No leukocytosis. PICC line to be removed. Repeat blood cultures pending. Source of infection remains unknown with pending TEE. If MSSA found in urine would likely be from systemic source. Will change her naf to zosyn for now due to fever o/n. (could be due to CVA?) Consider removing PIC if fever persists.  Family updated.   Principal Problem:   Sepsis (HCC) Active Problems:   Stroke (cerebrum) (HCC)   AKI (acute kidney injury) (HCC)   Obstructive pyelonephritis   Thrombocytopenia (HCC)   Hypokalemia   Lactic acidosis   Encephalopathy acute   MRSA bacteremia   .  stroke: mapping our early stages of recovery book   Does not apply Once  . chlorhexidine  15 mL Mouth Rinse BID  . Chlorhexidine Gluconate Cloth  6 each Topical Daily  . enalaprilat  0.625 mg Intravenous Q6H  . insulin aspart  0-9 Units Subcutaneous Q4H  . ipratropium-albuterol  3 mL Nebulization QID  . levothyroxine  37.5 mcg Intravenous Daily  . mouth rinse  15 mL Mouth Rinse q12n4p  . pantoprazole (PROTONIX) IV  40 mg Intravenous Q24H  . potassium chloride  20 mEq Oral Daily  . sodium chloride flush  10-40 mL Intracatheter Q12H  . thiamine injection  100 mg Intravenous Daily    SUBJECTIVE:  Blood cultures from 12/11 pending. Continues on nafcillin and levofloxacin.    Allergies  Allergen Reactions  . Ativan [Lorazepam] Other (See Comments)    Patient's husband said related  to being oversedated not true allergy     Review of Systems: Review of Systems  Unable to perform ROS: Critical illness      OBJECTIVE: Vitals:   10/04/17 0600 10/04/17 0630 10/04/17 0700 10/04/17 0742  BP: (!) 180/111 (!) 158/82 (!) 160/85   Pulse: 92 88 81   Resp: (!) 24 20 20    Temp:      TempSrc:      SpO2: 94% 93% 95% 95%  Weight:      Height:       Body mass index is 24.56 kg/m.  Physical Exam  Constitutional: She appears lethargic. She appears to not be writhing in pain. She has a sickly appearance. No distress.  Cardiovascular: Normal rate, regular rhythm and normal heart sounds.  Pulmonary/Chest: Breath sounds normal. Tachypnea noted.  Abdominal: Soft. Bowel sounds are normal. There is no tenderness.  Musculoskeletal:       Arms: Neurological: She appears lethargic.  Skin: She is not diaphoretic.    Lab Results Lab Results  Component Value Date   WBC 8.5 10/04/2017   HGB 12.2 10/04/2017   HCT 36.8 10/04/2017   MCV 89.1 10/04/2017   PLT 128 (L) 10/04/2017    Lab Results  Component Value Date   CREATININE 1.64 (H) 10/04/2017   BUN 27 (H) 10/04/2017   NA 165 (HH) 10/04/2017   K 2.7 (LL) 10/04/2017   CL >130 (HH) 10/04/2017   CO2 19 (  L) 10/04/2017    Lab Results  Component Value Date   ALT 72 (H) 10/01/2017   AST 183 (H) 10/01/2017   ALKPHOS 50 10/01/2017   BILITOT 0.6 10/01/2017     Microbiology: Recent Results (from the past 240 hour(s))  MRSA PCR Screening     Status: None   Collection Time: 10/07/2017  7:52 PM  Result Value Ref Range Status   MRSA by PCR NEGATIVE NEGATIVE Final    Comment:        The GeneXpert MRSA Assay (FDA approved for NASAL specimens only), is one component of a comprehensive MRSA colonization surveillance program. It is not intended to diagnose MRSA infection nor to guide or monitor treatment for MRSA infections.   Culture, blood (x 2)     Status: Abnormal   Collection Time: 09/29/2017  8:35 PM  Result Value  Ref Range Status   Specimen Description BLOOD LEFT ANTECUBITAL  Final   Special Requests IN PEDIATRIC BOTTLE Blood Culture adequate volume  Final   Culture  Setup Time   Final    GRAM POSITIVE COCCI IN CLUSTERS IN PEDIATRIC BOTTLE CRITICAL VALUE NOTED.  VALUE IS CONSISTENT WITH PREVIOUSLY REPORTED AND CALLED VALUE.    Culture (A)  Final    STAPHYLOCOCCUS AUREUS SUSCEPTIBILITIES PERFORMED ON PREVIOUS CULTURE WITHIN THE LAST 5 DAYS.    Report Status 10/04/2017 FINAL  Final  Culture, blood (x 2)     Status: Abnormal   Collection Time: 10/01/2017  8:40 PM  Result Value Ref Range Status   Specimen Description BLOOD LEFT ANTECUBITAL  Final   Special Requests IN PEDIATRIC BOTTLE Blood Culture adequate volume  Final   Culture  Setup Time   Final    GRAM POSITIVE COCCI IN CLUSTERS IN PEDIATRIC BOTTLE CRITICAL RESULT CALLED TO, READ BACK BY AND VERIFIED WITH: B MANCHERIL,PHARMD AT 0825 10/02/17 BY L BENFIELD    Culture STAPHYLOCOCCUS AUREUS (A)  Final   Report Status 10/04/2017 FINAL  Final   Organism ID, Bacteria STAPHYLOCOCCUS AUREUS  Final      Susceptibility   Staphylococcus aureus - MIC*    CIPROFLOXACIN <=0.5 SENSITIVE Sensitive     ERYTHROMYCIN >=8 RESISTANT Resistant     GENTAMICIN <=0.5 SENSITIVE Sensitive     OXACILLIN <=0.25 SENSITIVE Sensitive     TETRACYCLINE <=1 SENSITIVE Sensitive     VANCOMYCIN <=0.5 SENSITIVE Sensitive     TRIMETH/SULFA <=10 SENSITIVE Sensitive     CLINDAMYCIN RESISTANT Resistant     RIFAMPIN <=0.5 SENSITIVE Sensitive     Inducible Clindamycin POSITIVE Resistant     * STAPHYLOCOCCUS AUREUS  Blood Culture ID Panel (Reflexed)     Status: Abnormal   Collection Time: 10/12/2017  8:40 PM  Result Value Ref Range Status   Enterococcus species NOT DETECTED NOT DETECTED Final   Listeria monocytogenes NOT DETECTED NOT DETECTED Final   Staphylococcus species DETECTED (A) NOT DETECTED Final    Comment: CRITICAL RESULT CALLED TO, READ BACK BY AND VERIFIED WITH: B  MANCHERIL,PHARMD AT 0825 10/02/17 BY L BENFIELD    Staphylococcus aureus DETECTED (A) NOT DETECTED Final    Comment: Methicillin (oxacillin) susceptible Staphylococcus aureus (MSSA). Preferred therapy is anti staphylococcal beta lactam antibiotic (Cefazolin or Nafcillin), unless clinically contraindicated. CRITICAL RESULT CALLED TO, READ BACK BY AND VERIFIED WITH: B MANCHERIL,PHARMD AT 0825 10/02/17 BY L BENFIELD    Methicillin resistance NOT DETECTED NOT DETECTED Final   Streptococcus species NOT DETECTED NOT DETECTED Final   Streptococcus agalactiae NOT DETECTED NOT  DETECTED Final   Streptococcus pneumoniae NOT DETECTED NOT DETECTED Final   Streptococcus pyogenes NOT DETECTED NOT DETECTED Final   Acinetobacter baumannii NOT DETECTED NOT DETECTED Final   Enterobacteriaceae species NOT DETECTED NOT DETECTED Final   Enterobacter cloacae complex NOT DETECTED NOT DETECTED Final   Escherichia coli NOT DETECTED NOT DETECTED Final   Klebsiella oxytoca NOT DETECTED NOT DETECTED Final   Klebsiella pneumoniae NOT DETECTED NOT DETECTED Final   Proteus species NOT DETECTED NOT DETECTED Final   Serratia marcescens NOT DETECTED NOT DETECTED Final   Haemophilus influenzae NOT DETECTED NOT DETECTED Final   Neisseria meningitidis NOT DETECTED NOT DETECTED Final   Pseudomonas aeruginosa NOT DETECTED NOT DETECTED Final   Candida albicans NOT DETECTED NOT DETECTED Final   Candida glabrata NOT DETECTED NOT DETECTED Final   Candida krusei NOT DETECTED NOT DETECTED Final   Candida parapsilosis NOT DETECTED NOT DETECTED Final   Candida tropicalis NOT DETECTED NOT DETECTED Final     Marcos Eke, NP [facility] [phone] Pager  10/04/2017  9:20 AM

## 2017-10-05 ENCOUNTER — Encounter (HOSPITAL_COMMUNITY): Admission: AD | Disposition: E | Payer: Self-pay | Source: Other Acute Inpatient Hospital | Attending: Pulmonary Disease

## 2017-10-05 ENCOUNTER — Encounter (HOSPITAL_COMMUNITY): Payer: Self-pay

## 2017-10-05 ENCOUNTER — Inpatient Hospital Stay (HOSPITAL_COMMUNITY): Payer: BLUE CROSS/BLUE SHIELD

## 2017-10-05 LAB — BASIC METABOLIC PANEL
BUN: 34 mg/dL — AB (ref 6–20)
CO2: 19 mmol/L — ABNORMAL LOW (ref 22–32)
Calcium: 7.9 mg/dL — ABNORMAL LOW (ref 8.9–10.3)
Creatinine, Ser: 2.01 mg/dL — ABNORMAL HIGH (ref 0.44–1.00)
GFR calc Af Amer: 30 mL/min — ABNORMAL LOW (ref >60)
GFR calc non Af Amer: 26 mL/min — ABNORMAL LOW (ref >60)
Glucose, Bld: 128 mg/dL — ABNORMAL HIGH (ref 65–99)
POTASSIUM: 3.2 mmol/L — AB (ref 3.5–5.1)
SODIUM: 168 mmol/L — AB (ref 135–145)

## 2017-10-05 LAB — GLUCOSE, CAPILLARY
GLUCOSE-CAPILLARY: 149 mg/dL — AB (ref 65–99)
GLUCOSE-CAPILLARY: 186 mg/dL — AB (ref 65–99)
GLUCOSE-CAPILLARY: 152 mg/dL — AB (ref 65–99)
GLUCOSE-CAPILLARY: 117 mg/dL — AB (ref 65–99)
Glucose-Capillary: 96 mg/dL (ref 65–99)
Glucose-Capillary: 149 mg/dL — ABNORMAL HIGH (ref 65–99)

## 2017-10-05 LAB — PROCALCITONIN: Procalcitonin: <0.1 ng/mL

## 2017-10-05 LAB — PHOSPHORUS
PHOSPHORUS: 4 mg/dL (ref 2.5–4.6)
PHOSPHORUS: 2.8 mg/dL (ref 2.5–4.6)

## 2017-10-05 LAB — MAGNESIUM
MAGNESIUM: 2.2 mg/dL (ref 1.7–2.4)
Magnesium: 2.2 mg/dL (ref 1.7–2.4)

## 2017-10-05 SURGERY — CANCELLED PROCEDURE

## 2017-10-05 MED ORDER — MORPHINE SULFATE (PF) 4 MG/ML IV SOLN
1.0000 mg | INTRAVENOUS | Status: DC | PRN
  Administered 2017-10-05 – 2017-10-11 (×10): 1 mg via INTRAVENOUS
  Filled 2017-10-05 (×9): qty 1

## 2017-10-05 MED ORDER — KCL IN DEXTROSE-NACL 20-5-0.45 MEQ/L-%-% IV SOLN
INTRAVENOUS | Status: DC
  Administered 2017-10-05 – 2017-10-10 (×6): via INTRAVENOUS
  Filled 2017-10-05 (×8): qty 1000

## 2017-10-05 MED ORDER — FREE WATER
200.0000 mL | Status: DC
  Administered 2017-10-05 – 2017-10-06 (×6): 200 mL

## 2017-10-05 NOTE — Progress Notes (Signed)
    [facility] has been requested to perform a transesophageal echocardiogram on Ashlee Snow for stroke and bacteremia.  After careful review of history and examination, I called the patient's husband, Ashlee Snow, and the risks and benefits of transesophageal echocardiogram have been explained including risks of esophageal damage, perforation (1:10,000 risk), bleeding, pharyngeal hematoma as well as other potential complications associated with conscious sedation including aspiration, arrhythmia, respiratory failure and death. Alternatives to treatment were discussed, questions were answered. Patient's husband is willing to proceed.   The procedure is scheduled for today at 13:30 to be done by Dr. Elease HashimotoNahser.   Berton BonJanine Skyy Nilan, NP  10/15/2017 11:59 AM

## 2017-10-05 NOTE — Interval H&P Note (Signed)
History and Physical Interval Note:  10/23/2017 12:48 PM  Ashlee Snow  has presented today for surgery, with the diagnosis of sepsis, stroke  The various methods of treatment have been discussed with the patient and family. After consideration of risks, benefits and other options for treatment, the patient has consented to  Procedure(s): TRANSESOPHAGEAL ECHOCARDIOGRAM (TEE) (N/A) as a surgical intervention .  The patient's history has been reviewed, patient examined, no change in status, stable for surgery.  I have reviewed the patient's chart and labs.  Questions were answered to the patient's satisfaction.     [name]

## 2017-10-05 NOTE — Progress Notes (Signed)
    Pt was brought to endo for TEE for further work up of her sepsis and CVA  She is currently unresponsive Sodium level is 168 She need anesthesia for sedtion   I would prefer that she be better "tuned up " before we perform TEE It does not appear that a TEE right now will change our current management   Reschedule after she is better from a medical standpoint     [name], MD  10/12/2017 1:34 PM    [facility] [address],  [address] [place], [place]  [address] Pager [phone] Phone: [phone]; Fax: [phone]

## 2017-10-05 NOTE — Progress Notes (Signed)
Physical Therapy Treatment Patient Details Name: Ashlee Snow A Turney MRN: [id number] DOB: [birth year] Today's Date: 10/21/2017    History of Present Illness This is a 61 year old with a past medical history of fibromyalgia, hypertension, and hypothyroidism who for several days prior to admission had apparently been suffering from  Confusion, fevers, N/V, right flank pain,. Found to have Severe sepsis secondary to pyelonephritis with ureteral obstruction and MRI: Acute/subacute inferior right cerebellum and vermis infarct with associated hemorrhage. Additional scattered punctate foci of acute nonhemorrhagic infarct involving the right PCA territory with 1 lesion involving the right MCA territory along the right middle frontal gyrus. Multiple foci of remote encephalomalacia involving the inferior posterior right temporal lobe, lateral right temporal lobe, and high right parietal lobe    PT Comments    Pt moaning throughout session without words produced. Pt not utilizing LUE or LLE during mobility and required increased assist for all tranfers today. Pt continues to maintain right gaze preference with right neck rotation and positioned in midline with towel roll end of session. Will continue to follow.    Follow Up Recommendations  CIR;Supervision/Assistance - 24 hour     Equipment Recommendations  Wheelchair cushion (measurements PT);Wheelchair (measurements PT);Hospital bed    Recommendations for Other Services       Precautions / Restrictions Precautions Precautions: Fall Precaution Comments: Cortrak    Mobility  Bed Mobility Overal bed mobility: Needs Assistance Bed Mobility: Rolling;Sidelying to Sit Rolling: Max assist Sidelying to sit: Max assist       General bed mobility comments: max assist to rotate trunk and pelvis and elevate trunk with bringing legs to EOB  Transfers Overall transfer level: Needs assistance   Transfers: Sit to/from Stand;Stand Pivot Transfers Sit to  Stand: Mod assist;+2 physical assistance;+2 safety/equipment Stand pivot transfers: +2 physical assistance;Max assist       General transfer comment: pt with LLE off the floor with standing and pt placing LLE posterior to RLE despite cues. Stood x 2 with use of belt and pad with max multimodal cueing and assist for balance and rise. Max assist to pivot to control balance and turn  Ambulation/Gait             General Gait Details: unable   Stairs            Wheelchair Mobility    Modified Rankin (Stroke Patients Only)       Balance Overall balance assessment: Needs assistance Sitting-balance support: No upper extremity supported Sitting balance-Leahy Scale: Poor Sitting balance - Comments: EOB with min assist for midline posture x 4 min     Standing balance-Leahy Scale: Poor                              Cognition Arousal/Alertness: Awake/alert Behavior During Therapy: Flat affect Overall Cognitive Status: Difficult to assess Area of Impairment: Following commands                   Current Attention Level: Sustained   Following Commands: Follows one step commands inconsistently Safety/Judgement: Decreased awareness of safety;Decreased awareness of deficits   Problem Solving: Slow processing;Decreased initiation;Difficulty sequencing;Requires verbal cues;Requires tactile cues General Comments: pt moaning throughout session without any word production to questions or commands. Pt attempted to pick of RUE to use comb and brush tongue but could not reach object however spontaneously put right hand on head when hair brushed      Exercises  General Comments        Pertinent Vitals/Pain Pain Assessment: (CPOT= 3)    Home Living                      Prior Function            PT Goals (current goals can now be found in the care plan section) Progress towards PT goals: Not progressing toward goals - comment(pt with limited  command following and interraction today)    Frequency    Min 4X/week      PT Plan Current plan remains appropriate    Co-evaluation              AM-PAC PT "6 Clicks" Daily Activity  Outcome Measure  Difficulty turning over in bed (including adjusting bedclothes, sheets and blankets)?: Unable Difficulty moving from lying on back to sitting on the side of the bed? : Unable Difficulty sitting down on and standing up from a chair with arms (e.g., wheelchair, bedside commode, etc,.)?: Unable Help needed moving to and from a bed to chair (including a wheelchair)?: Total Help needed walking in hospital room?: Total Help needed climbing 3-5 steps with a railing? : Total 6 Click Score: 6    End of Session Equipment Utilized During Treatment: Gait belt Activity Tolerance: Patient tolerated treatment well Patient left: in chair;with call bell/phone within reach;with chair alarm set;with nursing/sitter in room Nurse Communication: Mobility status;Other (comment)(lateral scoot to return to bed) PT Visit Diagnosis: Unsteadiness on feet (R26.81);Other symptoms and signs involving the nervous system (R29.898)     Time: [id number] PT Time Calculation (min) (ACUTE ONLY): 25 min  Charges:  $Therapeutic Activity: 23-37 mins                    G Codes:       Delaney MeigsMaija Tabor Naleah Kofoed, PT [phone]    Jasmon Mattice B Arthurine Oleary 11-Jul-2017, 2:03 PM

## 2017-10-05 NOTE — Interval H&P Note (Deleted)
History and Physical Interval Note:  10/04/2017 1:14 PM  Ashlee Snow  has presented today for surgery, with the diagnosis of sepsis, stroke  The various methods of treatment have been discussed with the patient and family. After consideration of risks, benefits and other options for treatment, the patient has consented to  Procedure(s): TRANSESOPHAGEAL ECHOCARDIOGRAM (TEE) (N/A) as a surgical intervention .  The patient's history has been reviewed, patient examined, no change in status, stable for surgery.  I have reviewed the patient's chart and labs.  Questions were answered to the patient's satisfaction.     [name]

## 2017-10-05 NOTE — Progress Notes (Signed)
Lab called for sodium of 168 and chloride greater than 130. MD on the unit and was verbally notified.

## 2017-10-05 NOTE — Progress Notes (Signed)
STROKE TEAM PROGRESS NOTE  Admission History:  Ashlee Snow is an 61 y.o. female presenting from OSH with large subacute right cerebellar ischemic infarction with mass effect and pyelonephritis.   She presented to the emergency department for evaluation of confusion, right flank pain, and nausea with vomiting. Patient had reportedly been complaining of right flank pain for a couple weeks now, was otherwise well until the afternoon of 09/28/2017 when her pain increased fairly acutely and she developed nausea with nonbloody vomiting. She did not get out of bed the following day due to her illness and became quite confused by that evening.She had refused to go to the emergency department, but with her condition persisting today, her husband overrode her decision and she was taken to the ED.  [facility] [place]:Upon arrival to the ED, patient is found to befebrile to 40.3 degree C, saturating well on room air, tachypneic to 30, tachycardic to 150, and with stable blood pressure. EKG features a sinus tachycardia with rate 141 and nonspecific T wave abnormality in the lateral leads. Chest x-ray is negative for acute cardiopulmonary disease. CT abdomen and pelvis is notable for right-sided hydronephrosis and proximal right hydroureter without definite calculus, raising concern for stricture/scar. Lactic acid was elevated to 11.9 the patient was treated with 2 L of lactated Ringer's and 2 g of IV Rocephin in the ED. A CT head was obtained at the OSH revealing a large subacute right cerebellar infarction with mass effect.   SUBJECTIVE (INTERVAL HISTORY)  Husband is at the bedside. Patient is found laying in bed, .she is more interactive today.  She remains confused and follows barely leave few simple commands. She is working with physical therapy at the bedside. Serum sodium remains elevated despite free water  OBJECTIVE Lab Results: CBC:  Recent Labs  Lab 10/02/17 0616 10/03/17 0716  10/04/17 0500  WBC 5.3 6.0 8.5  HGB 10.2* 11.6* 12.2  HCT 30.7* 35.6* 36.8  MCV 87.5 88.8 89.1  PLT 85* 96* 128*   BMP: Recent Labs  Lab 10/01/17 0317  10/01/17 0333  10/02/17 0616  10/02/17 1730 10/03/17 0017 10/03/17 0716 10/03/17 1017 10/03/17 1329 10/04/17 0500 10/04/17 1520 10-09-17 0523  NA  --   --  136   < > 151*  151*   < > 156* 158* 163*  --  164* 165*  --  168*  K  --    < > 3.4*  --  2.8*  --  3.0*  --   --  2.4*  --  2.7*  --  3.2*  CL  --   --  107  --  121*  --   --   --   --   --   --  >130*  --  >130*  CO2  --   --  17*  --  18*  --   --   --   --   --   --  19*  --  19*  GLUCOSE  --   --  153*  --  148*  --   --   --   --   --   --  124*  --  128*  BUN  --   --  24*  --  20  --   --   --   --   --   --  27*  --  34*  CREATININE  --   --  1.57*  --  1.30*  --   --   --   --   --   --  1.64*  --  2.01*  CALCIUM  --    < > 8.1*  --  7.5*  --   --   --   --   --   --  8.3*  --  7.9*  MG 1.3*  --   --   --  2.2  --   --   --   --   --   --  2.1 2.2 2.2  PHOS  --   --   --   --   --   --   --   --   --   --   --   --  3.5 4.0   < > = values in this interval not displayed.   Liver Function Tests:  Recent Labs  Lab 10/01/17 0333  AST 183*  ALT 72*  ALKPHOS 50  BILITOT 0.6  PROT 6.1*  ALBUMIN 2.7*   Recent Labs  Lab 10/01/17 0317  AMMONIA 36*   Cardiac Enzymes:  Recent Labs  Lab 10/01/17 0317 10/01/17 0420 10/01/17 1730  TROPONINI 1.04* 1.07* 0.58*   Coagulation Studies:  No results for input(s): APTT, INR in the last 72 hours. PHYSICAL EXAM Temp:  [99 F (37.2 C)-101.9 F (38.8 C)] 101.9 F (38.8 C) (12/13 1246) Pulse Rate:  [80-105] 89 (12/13 1246) Resp:  [18-28] 25 (12/13 1246) BP: (103-166)/(56-117) 143/56 (12/13 1246) SpO2:  [89 %-100 %] 98 % (12/13 1246) Weight:  [141 lb (64 kg)-141 lb 12.1 oz (64.3 kg)] 141 lb (64 kg) (12/13 1246) General - Well nourished, well developed,  Respiratory - Tachypneic. . Cardiovascular - Regular  rate and rhythm   Neurologic Examination: Mental Status: Awake. Impaired attention. Speech output is minimal and hypophonic, dysarthric, fragmentary and barely intelligible. Able to say "water". Not oriented.  Abulic. Fatigued-appearing. Does not fixate well on visual stimuli.  Cranial Nerves: II:  No blink to threat on the left. Intact blink to threat on the right. Right pupil 4 mm and reactive. Left pupil 6 mm and sluggishly reactive.  III,IV, VI: Saccadic dysmetria right greater than left but smooth pursuits bilaterally. Crosses midline bilaterally with conjugate EOM, but will not gaze fully to left or right. No nystagmus.  V,VII: No definite facial droop. Reacts to tactile stimulation bilaterally.  VIII: Hearing intact to voice IX,X: Hypophonic speech.   XI: Head at midline XII: midline tongue extension  Motor/Sensory: RUE: Biceps, triceps, deltoid and grip 2-3/5 LUE: Biceps, triceps, deltoid and grip 2-3/5 RLE:   Withdraws from noxious with 3-4/5 strength.  LLE:   Withdraws from noxious with 2/5 strength.  Deep Tendon Reflexes:  Brisk, low-amplitude 3+ reflexes throughout. Toes equivocal bilaterally.  Cerebellar: unable to perform  Gait: not tested  IMAGING: I have personally reviewed the radiological images below and agree with the radiology interpretations. Ct Head Wo Contrast Result Date: 10/01/2017 IMPRESSION: No significant change since yesterday. Acute infarction affecting the right cerebellum with swelling. Mass-effect upon the fourth ventricle but no change in size the lateral or third ventricles at this time. No hemorrhage. 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 Electronically Signed   By: Paulina FusiMark  Shogry M.D.   On: 10/01/2017 07:45   MRI/MRA Brain Wo Contrast Result Date: 10/01/2017 IMPRESSION: 1. Acute/subacute inferior right cerebellum and vermis infarct with associated hemorrhage. 2. Additional scattered punctate foci of acute nonhemorrhagic infarct  involving the right PCA territory with 1 lesion involving the right MCA territory along the right middle frontal gyrus. 3. Multiple foci of  remote encephalomalacia involving the inferior posterior right temporal lobe, lateral right temporal lobe, and high right parietal lobe. 4. 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 The right A1 and likely M1 segments fill via a patent anterior communicating artery. 5. Fenestration of the vertebrobasilar junction without significant basilar artery stenosis. 6. Marked attenuation of PCA branch vessels, worse on the right. Moderate proximal right PCA stenosis. 7. Fetal type left posterior cerebral artery.   Echocardiogram:  Study Conclusions - Left ventricle: The cavity size was normal. Wall thickness was   increased in a pattern of mild LVH. Systolic function was normal.   The estimated ejection fraction was in the range of 60% to 65%.   Wall motion was normal; there were no regional wall motion   abnormalities. Doppler parameters are consistent with abnormal   left ventricular relaxation (grade 1 diastolic dysfunction). - Mitral valve: There was mild regurgitation.  B/L Carotid U/S:                                                Right ECA and ICA waveforms are very similar. Unable to interrogate stenosis.  40-59% left ICA stenosis. Bilateral vertebral artery flow is antegrade.   CT Head:         10/02/17                                                   1. No significant change in the appearance of acute to subacute, hemorrhagic right inferior cerebellar infarct with slight positive mass effect on the adjacent fourth ventricle. No dilatation the third or lateral ventricles. 2. Chronic right parietal, posteromedial temporal and occipital lobe infarcts as well as right periventricular white matter tract lacunar infarct.   EEG:   This normal EEG is recorded in the waking state                                                                  _____________________________________________________________________ ASSESSMENT: Ashlee Snow is a 61 y.o. female with PMH of prior hypertension, coronary artery disease, hypothyroidism, chronic pain, depression, and peripheral arterial disease status post aorto-iliac bypass in June.   Acute, large Right cerebellar stroke with hemorrhage and mass effect Scattered punctate foci of acute nonhemorrhagic infarct -Right PCA territory Multiple foci of remote encephalomalacia - inferior posterior right temporal lobe, lateral right temporal lobe, and high right parietal lobe High-grade stenosis or occlusion of the cavernous right internal carotid artery.  Moderate proximal right PCA stenosis  Suspected Etiology:  Likely large vessel disease with high-grade terminal right ICA stenosis/occlusion and intracranial atherosclerosis  Resultant Symptoms:  Stroke Risk Factors: hyperlipidemia, hypertension and CAD, PVD Other Stroke Risk Factors: Advanced age, Cigarette smoker, Migraines     PLAN  10/11/2017: DC 3% Na 162-  today HOLD ASA for now, due to thrombocytopenia and risk of hemorrhagic conversion. TEE pending requested by ID for endocarditis Neurosurgery consulted - no need for decomp surgery at this  time. Appreciate recs Frequent neuro checks Telemetry monitoring Ongoing aggressive stroke risk factor management Follow up with [facility] in 6 weeks  Encephalopathy Likely multifactorial Will check EEG to r/o seizure activity  INTRACRANIAL Atherosclerosis &Stenosis: DAPT to be considered at discharge once patient improves  DYSPHAGIA: NPO until passes SLP swallow evaluation  MULTIPLE MEDICAL ISSUES Management per CCM team  HYPERTENSION: Stable, some elevated B/P's noted overnight Permissive hypertension (OK if <220/120) for 24-48 hours post stroke and then gradually normalized within 5-7 days. Nicardipine drip, Labetolol PRN-Avoid SBP less than  130 Long term BP goal normotensive. May slowly restart home B/P medications after 48 hours Home Meds: Metoprolol, Lisinopril  HYPERLIPIDEMIA:    Component Value Date/Time   CHOL 147 10/01/2017 0420   TRIG 335 (H) 10/01/2017 0420   HDL 34 (L) 10/01/2017 0420   CHOLHDL 4.3 10/01/2017 0420   VLDL 67 (H) 10/01/2017 0420   LDLCALC 46 10/01/2017 0420  Home Meds:  Pravachol 20 mg LDL  goal < 70 HOLD Pravachol 20 mg daily for now, until LFT's improve Continue statin at discharge, if LFT's improve  DIABETES: Lab Results  Component Value Date   HGBA1C 6.3 (H) 10/01/2017  HgbA1c goal < 7.0 Currently JX:[id number] Continue CBG monitoring and SSI DM education when applicable   TOBACCO ABUSE Current smoker Smoking cessation counseling when applicable Nicotine patch PRN  Other Active Problems: Principal Problem:   Sepsis (HCC) Active Problems:   Stroke (cerebrum) (HCC)   AKI (acute kidney injury) (HCC)   Obstructive pyelonephritis   Thrombocytopenia (HCC)   Hypokalemia   Lactic acidosis   Encephalopathy acute   MRSA bacteremia   Aspiration into airway  Hospital day # 5  VTE prophylaxis: SCD's  Diet : Diet NPO time specified   Prior Home Stroke Medications: aspirin 81 mg daily and Pravachol 20 mg  Discharge Stroke Meds: Please discharge patient on TBD   Disposition: 01-Home or Self Care Therapy Recs:  CIR Follow Recs:  Follow-up Information    Micki Riley, MD. Schedule an appointment as soon as possible for a visit in 6 week(s).   Specialties:  Neurology, Radiology Contact information: [address] [place] [address] [phone]          Kirstie Peri, MD- PCP follow up in 1-2 weeks after discharge  FAMILY UPDATES: Husband at bedside  TEAM UPDATES: Nelda Bucks, MD    I have personally examined this patient, reviewed notes, independently viewed imaging studies, participated in medical decision making and plan of care.ROS completed  by me personally and pertinent positives fully documented  I have made any additions or clarifications directly to the above note.  . Patient has presented with multiple posterior circulation time right hemispheric infarcts likely due to significant intracranial and extracranial atherosclerosis. She also has significant history of peripheral vascular disease and has aortic stent grafting with possible infection. Check transesophageal echocardiogram for endocarditis as per ID. Long discussion with her husband and Dr. Wallace Cullens at the bedside and answered questions. This patient is critically ill and at significant risk of neurological worsening, death and care requires constant monitoring of vital signs, hemodynamics,respiratory and cardiac monitoring, extensive review of multiple databases, frequent neurological assessment, discussion with family, other specialists and medical decision making of high complexity.I have made any additions or clarifications directly to the above note.This critical care time does not reflect procedure time, or teaching time or supervisory time of PA/NP/Med Resident etc but could involve care discussion time.  I spent 30 minutes of neurocritical care time  in the care of  this patient.     Delia HeadyPramod Jiro Kiester, MD Medical Director [facility] Pager: [phone] 09/23/2017 1:48 PM  To contact Stroke Continuity provider, please refer to [email or web address]. After hours, contact General Neurology

## 2017-10-05 NOTE — Progress Notes (Signed)
[facility]  Date of Admission:  10/10/2017     Total days of antibiotics 6  Zosyn Day 2 Nafcillin Day 2 days (Stopped 12/12) Levofloxacin 2 days ( Stopped 12/12) Meropenem 3 days (Stopped 12/10) Vancomycin 2 days (Stopped 12.09)           ASSESSMENT/PLAN  Right Cerebellar Stroke with Mass Effect - Off of hypertonic saline and remains hypernatremic. Neuro status appears stable with little change from previous and continues to moan at times. Continue with rehabilitation and stroke factor risk management per Stroke Team and CCM.   MSSA Bacteremia - Febrile overnight with no current fever. Appears to be tolerating Zosyn with no adverse side effects. Source of infection remains in question. Awaiting TEE to rule out endocarditis. May require additional imaging if TEE is negative. Repeat blood cultures with NGTD. Continue current dosage of Zosyn. Will likely require 2-6 weeks of antibiotic therapy pending TEE results or determination of source.   Principal Problem:   Sepsis (HCC) Active Problems:   Stroke (cerebrum) (HCC)   AKI (acute kidney injury) (HCC)   Obstructive pyelonephritis   Thrombocytopenia (HCC)   Hypokalemia   Lactic acidosis   Encephalopathy acute   MRSA bacteremia   Aspiration into airway   .  stroke: mapping our early stages of recovery book   Does not apply Once  . amLODipine  10 mg Per Tube Daily  . aspirin  300 mg Rectal Daily  . chlorhexidine  15 mL Mouth Rinse BID  . Chlorhexidine Gluconate Cloth  6 each Topical Daily  . feeding supplement (PRO-STAT SUGAR FREE 64)  30 mL Per Tube Daily  . free water  200 mL Per Tube Q6H  . insulin aspart  0-9 Units Subcutaneous Q4H  . ipratropium-albuterol  3 mL Nebulization BID  . levothyroxine  37.5 mcg Intravenous Daily  . mouth rinse  15 mL Mouth Rinse q12n4p  . pantoprazole (PROTONIX) IV  40 mg Intravenous Q24H  . potassium chloride  20 mEq Oral Daily  . sodium chloride flush  10-40  mL Intracatheter Q12H  . thiamine injection  100 mg Intravenous Daily    SUBJECTIVE:  Febrile overnight with T max of 101.1. Levofloxacin and Naficillin changed to Zosyn.   Allergies  Allergen Reactions  . Ativan [Lorazepam] Other (See Comments)    Patient's husband said related to being oversedated not true allergy     Review of Systems: Review of Systems  Unable to perform ROS: Acuity of condition      OBJECTIVE: Vitals:   10/02/2017 0500 10/20/2017 0600 10/06/2017 0700 09/24/2017 0800  BP: 121/68 131/85 138/72 (!) 145/85  Pulse: 86 84 80   Resp: 20 20 (!) 22 (!) 23  Temp:      TempSrc:      SpO2: 99% 100% 100%   Weight: 141 lb 12.1 oz (64.3 kg)     Height:       Body mass index is 24.33 kg/m.  Physical Exam  Constitutional: She is well-developed, well-nourished, and in no distress. She appears lethargic. She has a sickly appearance. No distress.  Cardiovascular: Normal rate, regular rhythm and intact distal pulses. Exam reveals no gallop and no friction rub.  No murmur heard. Pulmonary/Chest: Effort normal and breath sounds normal. No respiratory distress. She has no wheezes. She has no rales. She exhibits no tenderness.  Abdominal: Soft. Bowel sounds are normal. She exhibits distension. There is no tenderness.  Neurological: She appears lethargic.  Skin: Skin is warm and dry.  PICC line is clean without signs of infection.     Lab Results Lab Results  Component Value Date   WBC 8.5 10/04/2017   HGB 12.2 10/04/2017   HCT 36.8 10/04/2017   MCV 89.1 10/04/2017   PLT 128 (L) 10/04/2017    Lab Results  Component Value Date   CREATININE 2.01 (H) 10/04/2017   BUN 34 (H) 09/28/2017   NA 168 (HH) 10/23/2017   K 3.2 (L) 10/08/2017   CL >130 (HH) 09/24/2017   CO2 19 (L) 10/15/2017    Lab Results  Component Value Date   ALT 72 (H) 10/01/2017   AST 183 (H) 10/01/2017   ALKPHOS 50 10/01/2017   BILITOT 0.6 10/01/2017     Microbiology: Recent Results (from the  past 240 hour(s))  MRSA PCR Screening     Status: None   Collection Time: 10/04/2017  7:52 PM  Result Value Ref Range Status   MRSA by PCR NEGATIVE NEGATIVE Final    Comment:        The GeneXpert MRSA Assay (FDA approved for NASAL specimens only), is one component of a comprehensive MRSA colonization surveillance program. It is not intended to diagnose MRSA infection nor to guide or monitor treatment for MRSA infections.   Culture, blood (x 2)     Status: Abnormal   Collection Time: 10/13/2017  8:35 PM  Result Value Ref Range Status   Specimen Description BLOOD LEFT ANTECUBITAL  Final   Special Requests IN PEDIATRIC BOTTLE Blood Culture adequate volume  Final   Culture  Setup Time   Final    GRAM POSITIVE COCCI IN CLUSTERS IN PEDIATRIC BOTTLE CRITICAL VALUE NOTED.  VALUE IS CONSISTENT WITH PREVIOUSLY REPORTED AND CALLED VALUE.    Culture (A)  Final    STAPHYLOCOCCUS AUREUS SUSCEPTIBILITIES PERFORMED ON PREVIOUS CULTURE WITHIN THE LAST 5 DAYS.    Report Status 10/04/2017 FINAL  Final  Culture, blood (x 2)     Status: Abnormal   Collection Time: 10/11/2017  8:40 PM  Result Value Ref Range Status   Specimen Description BLOOD LEFT ANTECUBITAL  Final   Special Requests IN PEDIATRIC BOTTLE Blood Culture adequate volume  Final   Culture  Setup Time   Final    GRAM POSITIVE COCCI IN CLUSTERS IN PEDIATRIC BOTTLE CRITICAL RESULT CALLED TO, READ BACK BY AND VERIFIED WITH: B MANCHERIL,PHARMD AT 0825 10/02/17 BY L BENFIELD    Culture STAPHYLOCOCCUS AUREUS (A)  Final   Report Status 10/04/2017 FINAL  Final   Organism ID, Bacteria STAPHYLOCOCCUS AUREUS  Final      Susceptibility   Staphylococcus aureus - MIC*    CIPROFLOXACIN <=0.5 SENSITIVE Sensitive     ERYTHROMYCIN >=8 RESISTANT Resistant     GENTAMICIN <=0.5 SENSITIVE Sensitive     OXACILLIN <=0.25 SENSITIVE Sensitive     TETRACYCLINE <=1 SENSITIVE Sensitive     VANCOMYCIN <=0.5 SENSITIVE Sensitive     TRIMETH/SULFA <=10 SENSITIVE  Sensitive     CLINDAMYCIN RESISTANT Resistant     RIFAMPIN <=0.5 SENSITIVE Sensitive     Inducible Clindamycin POSITIVE Resistant     * STAPHYLOCOCCUS AUREUS  Blood Culture ID Panel (Reflexed)     Status: Abnormal   Collection Time: 09/29/2017  8:40 PM  Result Value Ref Range Status   Enterococcus species NOT DETECTED NOT DETECTED Final   Listeria monocytogenes NOT DETECTED NOT DETECTED Final   Staphylococcus species DETECTED (A) NOT DETECTED Final    Comment:  CRITICAL RESULT CALLED TO, READ BACK BY AND VERIFIED WITH: B MANCHERIL,PHARMD AT 0825 10/02/17 BY L BENFIELD    Staphylococcus aureus DETECTED (A) NOT DETECTED Final    Comment: Methicillin (oxacillin) susceptible Staphylococcus aureus (MSSA). Preferred therapy is anti staphylococcal beta lactam antibiotic (Cefazolin or Nafcillin), unless clinically contraindicated. CRITICAL RESULT CALLED TO, READ BACK BY AND VERIFIED WITH: B MANCHERIL,PHARMD AT 0825 10/02/17 BY L BENFIELD    Methicillin resistance NOT DETECTED NOT DETECTED Final   Streptococcus species NOT DETECTED NOT DETECTED Final   Streptococcus agalactiae NOT DETECTED NOT DETECTED Final   Streptococcus pneumoniae NOT DETECTED NOT DETECTED Final   Streptococcus pyogenes NOT DETECTED NOT DETECTED Final   Acinetobacter baumannii NOT DETECTED NOT DETECTED Final   Enterobacteriaceae species NOT DETECTED NOT DETECTED Final   Enterobacter cloacae complex NOT DETECTED NOT DETECTED Final   Escherichia coli NOT DETECTED NOT DETECTED Final   Klebsiella oxytoca NOT DETECTED NOT DETECTED Final   Klebsiella pneumoniae NOT DETECTED NOT DETECTED Final   Proteus species NOT DETECTED NOT DETECTED Final   Serratia marcescens NOT DETECTED NOT DETECTED Final   Haemophilus influenzae NOT DETECTED NOT DETECTED Final   Neisseria meningitidis NOT DETECTED NOT DETECTED Final   Pseudomonas aeruginosa NOT DETECTED NOT DETECTED Final   Candida albicans NOT DETECTED NOT DETECTED Final   Candida  glabrata NOT DETECTED NOT DETECTED Final   Candida krusei NOT DETECTED NOT DETECTED Final   Candida parapsilosis NOT DETECTED NOT DETECTED Final   Candida tropicalis NOT DETECTED NOT DETECTED Final  Culture, blood (routine x 2)     Status: None (Preliminary result)   Collection Time: 10/03/17  9:40 AM  Result Value Ref Range Status   Specimen Description BLOOD LEFT HAND  Final   Special Requests IN PEDIATRIC BOTTLE Blood Culture adequate volume  Final   Culture NO GROWTH 1 DAY  Final   Report Status PENDING  Incomplete  Culture, blood (routine x 2)     Status: None (Preliminary result)   Collection Time: 10/03/17  9:50 AM  Result Value Ref Range Status   Specimen Description BLOOD LEFT HAND  Final   Special Requests IN PEDIATRIC BOTTLE Blood Culture adequate volume  Final   Culture NO GROWTH 1 DAY  Final   Report Status PENDING  Incomplete     Marcos EkeGreg Rahaf Carbonell, NP [facility] [phone] Pager  10/15/2017  9:26 AM

## 2017-10-05 NOTE — Progress Notes (Signed)
Patient came to Endo for a tee today 12/13. Patient appears to be very lethargic and non- communicative for us. Dr. Elease HashimotoNahser notified and after reviewing the labs and speaking with Dr. Ninetta LightsHatcher she has been cancelled.

## 2017-10-05 NOTE — Progress Notes (Signed)
PULMONARY / CRITICAL CARE MEDICINE   Name: Ashlee Snow MRN: [id number] DOB: 15-Jul-1956    ADMISSION DATE:  10/14/2017   CHIEF COMPLAINT: Fever and nausea  HISTORY OF PRESENT ILLNESS:   This is a 61 year old with a past medical history of fibromyalgia, hypertension, and hypothyroidism who for several days prior to admission had apparently been suffering from nausea and vomiting.  She subsequently had fevers and was evaluated at an outside hospital where she was found to have left hydronephrosis.  A ureteral stent was placed and she was treated for urosepsis with antibiotics and generous fluids.  She has grown MSSA from her blood.  We have no urine culture.  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   Her oxygen requirements increased substantially on 12/10 and Levaquin  was added to nafcillin to cover for presumed aspiration.  Zosyn was subsequently substituted for both. Her oxygen requirements have improved, but her mental status has not. She is constantly moaning, but not interactive.   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  PAST SURGICAL HISTORY: She  has a past surgical history that includes Cholecystectomy (01/22/1997); Abdominal hysterectomy; Ovarian cyst surgery; aortogram w/ stenting (2007, 2011, Aug. 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  Allergies   Allergen Reactions  . Ativan [Lorazepam] Other (See Comments)    Patient's husband said related to being oversedated not true allergy    No current facility-administered medications on file prior to encounter.    Current Outpatient Medications on File Prior to Encounter  Medication Sig  . BLACK COHOSH PO Take 3 capsules by mouth daily.   . ergocalciferol (VITAMIN D2) 50000 units capsule Take 50,000 Units by mouth once a week. Monday  . HM ASPIRIN 81 MG chewable tablet Chew 81 mg by mouth 2 (two) times daily.   Marland Kitchen. levothyroxine (SYNTHROID, LEVOTHROID) 75 MCG tablet Take 75 mcg by mouth daily before breakfast.  . LINZESS 145 MCG CAPS capsule Take 145 mcg by mouth daily.  Marland Kitchen. lisinopril (PRINIVIL,ZESTRIL) 20 MG tablet Take 20 mg by mouth every morning.   Marland Kitchen. LYRICA 150 MG capsule Take 150 mg by mouth 2 (two) times daily.  . methocarbamol (ROBAXIN) 750 MG tablet Take 750 mg by mouth 3 (three) times daily.   . metoprolol succinate (TOPROL-XL) 25 MG 24 hr tablet Take 25 mg by mouth every morning.  Marland Kitchen. oxyCODONE-acetaminophen (PERCOCET) 10-325 MG tablet Take 1 tablet by mouth every 6 (six) hours as needed for pain. (Patient taking differently: Take 1 tablet by mouth 4 (four) times daily. )  . pravastatin (PRAVACHOL) 20 MG tablet Take 20 mg by mouth every morning.   . promethazine (PHENERGAN) 25 MG tablet Take 1 tablet by mouth every 6 (six) hours as needed.  . venlafaxine XR (EFFEXOR-XR) 75 MG 24 hr capsule Take 150 mg every morning by mouth.     FAMILY HISTORY:  Her indicated that her mother is deceased. She indicated that her father is deceased. She indicated that her sister is deceased. She indicated that the status of  her neg hx is unknown.   SOCIAL HISTORY: She  reports that she has been smoking e-cigarettes.  She started smoking about 42 years ago. She has been smoking about 1.00 pack per day. she has never used smokeless tobacco. She reports that she does not drink alcohol or use  drugs.  REVIEW OF SYSTEMS:   Unobtainable  SUBJECTIVE:  Unobtainable  VITAL SIGNS: BP 131/85   Pulse 84   Temp 99.3 F (37.4 C) (Oral)   Resp 20   Ht 5\' 4"  (1.626 m)   Wt 141 lb 12.1 oz (64.3 kg)   SpO2 100%   BMI 24.33 kg/m       INTAKE / OUTPUT: I/O last 3 completed shifts: In: 1325.3 [I.V.:30; NG/GT:945.3; IV Piggyback:350] Out: 1700 [Urine:1700]  PHYSICAL EXAMINATION: General: She is constantly moaning but not in overt distress.     Neuro: Less phonation today, only moaning.  Pupils are equal today.  She does not have exaggerated nystagmus.  The face is symmetric.  Moves all fours on request. Cardiovascular: S1 and S2 are regular without murmur rub or gallop. Lungs: Respirations are unlabored.  There are far fewer rhonchi on the left.   Air movement is symmetrical. Abdomen: Abdomen is soft without organomegaly masses tenderness guarding or rebound.  LABS:  BMET Recent Labs  Lab 10/02/17 0616  10/03/17 1017 10/03/17 1329 10/04/17 0500 09/24/2017 0523  NA 151*  151*   < >  --  164* 165* 168*  K 2.8*   < > 2.4*  --  2.7* 3.2*  CL 121*  --   --   --  >130* >130*  CO2 18*  --   --   --  19* 19*  BUN 20  --   --   --  27* 34*  CREATININE 1.30*  --   --   --  1.64* 2.01*  GLUCOSE 148*  --   --   --  124* 128*   < > = values in this interval not displayed.    Electrolytes Recent Labs  Lab 10/02/17 0616 10/04/17 0500 10/04/17 1520 10/04/2017 0523  CALCIUM 7.5* 8.3*  --  7.9*  MG 2.2 2.1 2.2 2.2  PHOS  --   --  3.5 4.0    CBC Recent Labs  Lab 10/02/17 0616 10/03/17 0716 10/04/17 0500  WBC 5.3 6.0 8.5  HGB 10.2* 11.6* 12.2  HCT 30.7* 35.6* 36.8  PLT 85* 96* 128*    Coag's Recent Labs  Lab 06/12/2017 2104  APTT 33  INR 1.08    Sepsis Markers Recent Labs  Lab 10/01/17 0420 10/02/17 0839 10/02/17 1137 10/03/17 0017 10/03/17 1017 10/04/17 0500  LATICACIDVEN 3.1* 1.6 1.4  --   --   --   PROCALCITON  --   --   --  16.53 12.94 6.44     ABG No results for input(s): PHART, PCO2ART, PO2ART in the last 168 hours.  Liver Enzymes Recent Labs  Lab 10/01/17 0333  AST 183*  ALT 72*  ALKPHOS 50  BILITOT 0.6  ALBUMIN 2.7*    Cardiac Enzymes Recent Labs  Lab 10/01/17 0317 10/01/17 0420 10/01/17 1730  TROPONINI 1.04* 1.07* 0.58*    Glucose Recent Labs  Lab 10/04/17 0819 10/04/17 1150 10/04/17 1624 10/04/17 2034 10/04/17 2347 10/16/2017 0342  GLUCAP 109* 118* 103* 144* 137* 149*    Imaging No results found.     ANTIBIOTICS: Levaquin and nafcillin   DISCUSSION: This is a 61 year old with a  history of hypertension, fibromyalgia, peripheral vascular disease, and prior CVA who was treated for a UTI and hydronephrosis with placement of a left ureteral stent.  She had persistent difficulties with nausea and vomiting and a head CT has shown a right cerebellar infarct.  She has grown MSSA from her blood.  ASSESSMENT / PLAN:  PULMONARY A: Story status seems somewhat improved.  Oxygen requirements are down and exam is improved.  We will be continuing Zosyn for now as well as inhaled bronchodilators.  Treating presumed aspiration.   CARDIOVASCULAR A: She was switched to Norvasc and as needed labetalol for blood pressure control last night out of respect for her declining renal function.      INFECTIOUS A: Urinary tract infection, and subsequently likely aspiration.  She was switched to Zosyn to cover both MSSA and the blood, aspiration, and potential urinary tract infection.   I am a little concerned that MSSA is a unusual organism for urinary tract infection and we did not isolate that organism from the urine.  Also concerned that she has had bypass surgery in the past and likely has a Gore-Tex graft in place.  She may require protracted therapy for her staph bacteremia, [place] the results of a TEE today.  The TEE however may get some insight into the etiology of her stroke.  I will be calling [facility] rocking him  later today to see if they ever isolated anything from her urine.  Perhaps the ureteral stone and hydroureter were incidental findings not related to her primary pathology.  NEUROLOGIC A: Status remains poor.  In addition to her fixed structural insult, she is profoundly hypernatremic and infected.  They are going to continue her on Zosyn for now, and we are far enough out from her cerebellar stroke to begin some relatively hypotonic IV fluid to correct her hypernatremia.  We will be checking a TSH in the morning to ensure that hypothyroidism is not also contributing to her poor mental status.   I am also somewhat concerned that she has a pancytopenia and although the history does not report alcoholism I have added thiamine.  Declining renal function. Likely some component of dehydration. ACE inhibitor D/C'd yesterday. Slowing introducing hypotonic IVF which soul be safe this far out from infarct.   Penny Pia, MD Pulmonary and Critical Care Medicine [facility] Pager: [phone]  October 07, 2017, 7:36 AM

## 2017-10-05 NOTE — Progress Notes (Signed)
  Speech Language Pathology Treatment: Dysphagia;Cognitive-Linquistic  Patient Details Name: Ashlee Snow MRN: [id number] DOB: 11/21/1955 Today's Date: 09/29/2017 Time: [id number] SLP Time Calculation (min) (ACUTE ONLY): 25 min  Assessment / Plan / Recommendation Clinical Impression  Pt awake but moments of brief focused attention interspersed with staring, moaning and unresponsiveness. Pt did not verbally communicate but demonstrated intermittent understanding via facial expression and ability to follow one step commands. Persistent wet vocal quality noted indicating poor management of pharyngeal secretions. Despite RN efforts pts oral mucosa is severely dry due to open mouth breathing/moaning. After being given 2 ice chips with adequate attention and oral manipulation but weak swallow response SLP was able to further remove dry oral secretions and suction standing pharyngeal secretions. Advised RN to provide a couple pieces of ice to swallow after oral care to encourage hydration of oral mucosa. Will follow for continued readiness.    HPI HPI: This is a 61 year old with a past medical history of fibromyalgia, hypertension, and hypothyroidism who for several days prior to admission had apparently been suffering from  Confusion, fevers, N/V, right flank pain,. Found to have Severe sepsis secondary to pyelonephritis with ureteral obstruction and MRI: Acute/subacute inferior right cerebellum and vermis infarct with associated hemorrhage. Additional scattered punctate foci of acute nonhemorrhagic infarct involving the right PCA territory with 1 lesion involving the right MCA territory along the right middle frontal gyrus. Multiple foci of remote encephalomalacia involving the inferior posterior right temporal lobe, lateral right temporal lobe, and high right parietal lobe      SLP Plan  Continue with current plan of care       Recommendations  Diet recommendations: NPO Medication Administration:  Via alternative means Supervision: Full supervision/cueing for compensatory strategies                Oral Care Recommendations: Oral care prior to ice chip/H20 Follow up Recommendations: Inpatient Rehab SLP Visit Diagnosis: Dysarthria and anarthria (R47.1);Attention and concentration deficit Attention and concentration deficit following: Cerebral infarction Plan: Continue with current plan of care       GO                Pasquale Matters, Ashlee Snow 10/13/2017, 2:20 PM

## 2017-10-05 NOTE — H&P (View-Only) (Signed)
PULMONARY / CRITICAL CARE MEDICINE   Name: Ashlee Snow MRN: [id number] DOB: 15-Jul-1956    ADMISSION DATE:  10/14/2017   CHIEF COMPLAINT: Fever and nausea  HISTORY OF PRESENT ILLNESS:   This is a 61 year old with a past medical history of fibromyalgia, hypertension, and hypothyroidism who for several days prior to admission had apparently been suffering from nausea and vomiting.  She subsequently had fevers and was evaluated at an outside hospital where she was found to have left hydronephrosis.  A ureteral stent was placed and she was treated for urosepsis with antibiotics and generous fluids.  She has grown MSSA from her blood.  We have no urine culture.  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   Her oxygen requirements increased substantially on 12/10 and Levaquin  was added to nafcillin to cover for presumed aspiration.  Zosyn was subsequently substituted for both. Her oxygen requirements have improved, but her mental status has not. She is constantly moaning, but not interactive.   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  PAST SURGICAL HISTORY: She  has a past surgical history that includes Cholecystectomy (01/22/1997); Abdominal hysterectomy; Ovarian cyst surgery; aortogram w/ stenting (2007, 2011, Aug. 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  Allergies   Allergen Reactions  . Ativan [Lorazepam] Other (See Comments)    Patient's husband said related to being oversedated not true allergy    No current facility-administered medications on file prior to encounter.    Current Outpatient Medications on File Prior to Encounter  Medication Sig  . BLACK COHOSH PO Take 3 capsules by mouth daily.   . ergocalciferol (VITAMIN D2) 50000 units capsule Take 50,000 Units by mouth once a week. Monday  . HM ASPIRIN 81 MG chewable tablet Chew 81 mg by mouth 2 (two) times daily.   Marland Kitchen. levothyroxine (SYNTHROID, LEVOTHROID) 75 MCG tablet Take 75 mcg by mouth daily before breakfast.  . LINZESS 145 MCG CAPS capsule Take 145 mcg by mouth daily.  Marland Kitchen. lisinopril (PRINIVIL,ZESTRIL) 20 MG tablet Take 20 mg by mouth every morning.   Marland Kitchen. LYRICA 150 MG capsule Take 150 mg by mouth 2 (two) times daily.  . methocarbamol (ROBAXIN) 750 MG tablet Take 750 mg by mouth 3 (three) times daily.   . metoprolol succinate (TOPROL-XL) 25 MG 24 hr tablet Take 25 mg by mouth every morning.  Marland Kitchen. oxyCODONE-acetaminophen (PERCOCET) 10-325 MG tablet Take 1 tablet by mouth every 6 (six) hours as needed for pain. (Patient taking differently: Take 1 tablet by mouth 4 (four) times daily. )  . pravastatin (PRAVACHOL) 20 MG tablet Take 20 mg by mouth every morning.   . promethazine (PHENERGAN) 25 MG tablet Take 1 tablet by mouth every 6 (six) hours as needed.  . venlafaxine XR (EFFEXOR-XR) 75 MG 24 hr capsule Take 150 mg every morning by mouth.     FAMILY HISTORY:  Her indicated that her mother is deceased. She indicated that her father is deceased. She indicated that her sister is deceased. She indicated that the status of  her neg hx is unknown.   SOCIAL HISTORY: She  reports that she has been smoking e-cigarettes.  She started smoking about 42 years ago. She has been smoking about 1.00 pack per day. she has never used smokeless tobacco. She reports that she does not drink alcohol or use  drugs.  REVIEW OF SYSTEMS:   Unobtainable  SUBJECTIVE:  Unobtainable  VITAL SIGNS: BP 131/85   Pulse 84   Temp 99.3 F (37.4 C) (Oral)   Resp 20   Ht 5\' 4"  (1.626 m)   Wt 141 lb 12.1 oz (64.3 kg)   SpO2 100%   BMI 24.33 kg/m       INTAKE / OUTPUT: I/O last 3 completed shifts: In: 1325.3 [I.V.:30; NG/GT:945.3; IV Piggyback:350] Out: 1700 [Urine:1700]  PHYSICAL EXAMINATION: General: She is constantly moaning but not in overt distress.     Neuro: Less phonation today, only moaning.  Pupils are equal today.  She does not have exaggerated nystagmus.  The face is symmetric.  Moves all fours on request. Cardiovascular: S1 and S2 are regular without murmur rub or gallop. Lungs: Respirations are unlabored.  There are far fewer rhonchi on the left.   Air movement is symmetrical. Abdomen: Abdomen is soft without organomegaly masses tenderness guarding or rebound.  LABS:  BMET Recent Labs  Lab 10/02/17 0616  10/03/17 1017 10/03/17 1329 10/04/17 0500 09/24/2017 0523  NA 151*  151*   < >  --  164* 165* 168*  K 2.8*   < > 2.4*  --  2.7* 3.2*  CL 121*  --   --   --  >130* >130*  CO2 18*  --   --   --  19* 19*  BUN 20  --   --   --  27* 34*  CREATININE 1.30*  --   --   --  1.64* 2.01*  GLUCOSE 148*  --   --   --  124* 128*   < > = values in this interval not displayed.    Electrolytes Recent Labs  Lab 10/02/17 0616 10/04/17 0500 10/04/17 1520 10/04/2017 0523  CALCIUM 7.5* 8.3*  --  7.9*  MG 2.2 2.1 2.2 2.2  PHOS  --   --  3.5 4.0    CBC Recent Labs  Lab 10/02/17 0616 10/03/17 0716 10/04/17 0500  WBC 5.3 6.0 8.5  HGB 10.2* 11.6* 12.2  HCT 30.7* 35.6* 36.8  PLT 85* 96* 128*    Coag's Recent Labs  Lab 06/12/2017 2104  APTT 33  INR 1.08    Sepsis Markers Recent Labs  Lab 10/01/17 0420 10/02/17 0839 10/02/17 1137 10/03/17 0017 10/03/17 1017 10/04/17 0500  LATICACIDVEN 3.1* 1.6 1.4  --   --   --   PROCALCITON  --   --   --  16.53 12.94 6.44     ABG No results for input(s): PHART, PCO2ART, PO2ART in the last 168 hours.  Liver Enzymes Recent Labs  Lab 10/01/17 0333  AST 183*  ALT 72*  ALKPHOS 50  BILITOT 0.6  ALBUMIN 2.7*    Cardiac Enzymes Recent Labs  Lab 10/01/17 0317 10/01/17 0420 10/01/17 1730  TROPONINI 1.04* 1.07* 0.58*    Glucose Recent Labs  Lab 10/04/17 0819 10/04/17 1150 10/04/17 1624 10/04/17 2034 10/04/17 2347 10/16/2017 0342  GLUCAP 109* 118* 103* 144* 137* 149*    Imaging No results found.     ANTIBIOTICS: Levaquin and nafcillin   DISCUSSION: This is a 61 year old with a  history of hypertension, fibromyalgia, peripheral vascular disease, and prior CVA who was treated for a UTI and hydronephrosis with placement of a left ureteral stent.  She had persistent difficulties with nausea and vomiting and a head CT has shown a right cerebellar infarct.  She has grown MSSA from her blood.  ASSESSMENT / PLAN:  PULMONARY A: Story status seems somewhat improved.  Oxygen requirements are down and exam is improved.  We will be continuing Zosyn for now as well as inhaled bronchodilators.  Treating presumed aspiration.   CARDIOVASCULAR A: She was switched to Norvasc and as needed labetalol for blood pressure control last night out of respect for her declining renal function.      INFECTIOUS A: Urinary tract infection, and subsequently likely aspiration.  She was switched to Zosyn to cover both MSSA and the blood, aspiration, and potential urinary tract infection.   I am a little concerned that MSSA is a unusual organism for urinary tract infection and we did not isolate that organism from the urine.  Also concerned that she has had bypass surgery in the past and likely has a Gore-Tex graft in place.  She may require protracted therapy for her staph bacteremia, [place] the results of a TEE today.  The TEE however may get some insight into the etiology of her stroke.  I will be calling [facility] rocking him  later today to see if they ever isolated anything from her urine.  Perhaps the ureteral stone and hydroureter were incidental findings not related to her primary pathology.  NEUROLOGIC A: Status remains poor.  In addition to her fixed structural insult, she is profoundly hypernatremic and infected.  They are going to continue her on Zosyn for now, and we are far enough out from her cerebellar stroke to begin some relatively hypotonic IV fluid to correct her hypernatremia.  We will be checking a TSH in the morning to ensure that hypothyroidism is not also contributing to her poor mental status.   I am also somewhat concerned that she has a pancytopenia and although the history does not report alcoholism I have added thiamine.  Declining renal function. Likely some component of dehydration. ACE inhibitor D/C'd yesterday. Slowing introducing hypotonic IVF which soul be safe this far out from infarct.   Penny Pia, MD Pulmonary and Critical Care Medicine [facility] Pager: [phone]  October 07, 2017, 7:36 AM

## 2017-10-06 ENCOUNTER — Inpatient Hospital Stay (HOSPITAL_COMMUNITY): Payer: BLUE CROSS/BLUE SHIELD

## 2017-10-06 ENCOUNTER — Other Ambulatory Visit (HOSPITAL_COMMUNITY): Payer: BLUE CROSS/BLUE SHIELD

## 2017-10-06 LAB — BASIC METABOLIC PANEL
BUN: 23 mg/dL — ABNORMAL HIGH (ref 6–20)
BUN: 20 mg/dL (ref 6–20)
CALCIUM: 7 mg/dL — AB (ref 8.9–10.3)
CO2: 18 mmol/L — AB (ref 22–32)
CO2: 19 mmol/L — ABNORMAL LOW (ref 22–32)
CREATININE: 1.43 mg/dL — AB (ref 0.44–1.00)
Calcium: 7.5 mg/dL — ABNORMAL LOW (ref 8.9–10.3)
Chloride: >130 mmol/L (ref 101–111)
Chloride: >130 mmol/L (ref 101–111)
Creatinine, Ser: 1.36 mg/dL — ABNORMAL HIGH (ref 0.44–1.00)
GFR calc Af Amer: 45 mL/min — ABNORMAL LOW (ref >60)
GFR calc Af Amer: 48 mL/min — ABNORMAL LOW (ref >60)
GFR calc non Af Amer: 39 mL/min — ABNORMAL LOW (ref >60)
GFR calc non Af Amer: 42 mL/min — ABNORMAL LOW (ref >60)
Glucose, Bld: 563 mg/dL (ref 65–99)
Glucose, Bld: 118 mg/dL — ABNORMAL HIGH (ref 65–99)
Potassium: 5.2 mmol/L — ABNORMAL HIGH (ref 3.5–5.1)
Potassium: 3.1 mmol/L — ABNORMAL LOW (ref 3.5–5.1)
Sodium: 157 mmol/L — ABNORMAL HIGH (ref 135–145)
Sodium: 158 mmol/L — ABNORMAL HIGH (ref 135–145)

## 2017-10-06 LAB — CBC WITH DIFFERENTIAL/PLATELET
BASOS PCT: 0 %
Basophils Absolute: 0 10*3/uL (ref 0.0–0.1)
EOS PCT: 1 %
Eosinophils Absolute: 0 10*3/uL (ref 0.0–0.7)
HEMATOCRIT: 29.8 % — AB (ref 36.0–46.0)
Hemoglobin: 9.5 g/dL — ABNORMAL LOW (ref 12.0–15.0)
LYMPHS PCT: 22 %
Lymphs Abs: 1.9 10*3/uL (ref 0.7–4.0)
MCH: 29.4 pg (ref 26.0–34.0)
MCHC: 31.9 g/dL (ref 30.0–36.0)
MCV: 92.3 fL (ref 78.0–100.0)
MONO ABS: 0.5 10*3/uL (ref 0.1–1.0)
MONOS PCT: 6 %
NEUTROS ABS: 6.3 10*3/uL (ref 1.7–7.7)
Neutrophils Relative %: 71 %
Platelets: 135 10*3/uL — ABNORMAL LOW (ref 150–400)
RBC: 3.23 MIL/uL — ABNORMAL LOW (ref 3.87–5.11)
RDW: 18.3 % — AB (ref 11.5–15.5)
WBC: 8.7 10*3/uL (ref 4.0–10.5)

## 2017-10-06 LAB — POCT I-STAT 3, ART BLOOD GAS (G3+)
Acid-base deficit: 7 mmol/L — ABNORMAL HIGH (ref 0.0–2.0)
Acid-base deficit: 7 mmol/L — ABNORMAL HIGH (ref 0.0–2.0)
Bicarbonate: 14.6 mmol/L — ABNORMAL LOW (ref 20.0–28.0)
Bicarbonate: 18 mmol/L — ABNORMAL LOW (ref 20.0–28.0)
O2 Saturation: 94 %
O2 Saturation: 100 %
PCO2 ART: 18.6 mmHg — AB (ref 32.0–48.0)
PO2 ART: 61 mmHg — AB (ref 83.0–108.0)
Patient temperature: 98.6
Patient temperature: 100.8
TCO2: 15 mmol/L — ABNORMAL LOW (ref 22–32)
TCO2: 19 mmol/L — ABNORMAL LOW (ref 22–32)
pCO2 arterial: 32.3 mmHg (ref 32.0–48.0)
pH, Arterial: 7.503 — ABNORMAL HIGH (ref 7.350–7.450)
pH, Arterial: 7.36 (ref 7.350–7.450)
pO2, Arterial: 371 mmHg — ABNORMAL HIGH (ref 83.0–108.0)

## 2017-10-06 LAB — URINALYSIS, ROUTINE W REFLEX MICROSCOPIC
Specific Gravity, Urine: 1.02 (ref 1.005–1.030)
pH: 5 (ref 5.0–8.0)

## 2017-10-06 LAB — GLUCOSE, CAPILLARY
GLUCOSE-CAPILLARY: 146 mg/dL — AB (ref 65–99)
Glucose-Capillary: 145 mg/dL — ABNORMAL HIGH (ref 65–99)
Glucose-Capillary: 157 mg/dL — ABNORMAL HIGH (ref 65–99)
Glucose-Capillary: 213 mg/dL — ABNORMAL HIGH (ref 65–99)
Glucose-Capillary: 126 mg/dL — ABNORMAL HIGH (ref 65–99)
Glucose-Capillary: 115 mg/dL — ABNORMAL HIGH (ref 65–99)
Glucose-Capillary: 89 mg/dL (ref 65–99)

## 2017-10-06 LAB — C DIFFICILE QUICK SCREEN W PCR REFLEX
C Diff antigen: NEGATIVE
C Diff interpretation: NOT DETECTED
C Diff toxin: NEGATIVE

## 2017-10-06 LAB — TSH: TSH: 8.319 u[IU]/mL — ABNORMAL HIGH (ref 0.350–4.500)

## 2017-10-06 LAB — URINALYSIS, MICROSCOPIC (REFLEX): SQUAMOUS EPITHELIAL / LPF: NONE SEEN

## 2017-10-06 LAB — MAGNESIUM
MAGNESIUM: 2.1 mg/dL (ref 1.7–2.4)
Magnesium: 2.1 mg/dL (ref 1.7–2.4)

## 2017-10-06 LAB — TRIGLYCERIDES: Triglycerides: 208 mg/dL — ABNORMAL HIGH

## 2017-10-06 LAB — PROCALCITONIN: PROCALCITONIN: 1.24 ng/mL

## 2017-10-06 MED ORDER — FREE WATER
200.0000 mL | Freq: Three times a day (TID) | Status: AC
  Administered 2017-10-06 – 2017-10-08 (×6): 200 mL

## 2017-10-06 MED ORDER — LEVOTHYROXINE SODIUM 100 MCG IV SOLR
50.0000 ug | Freq: Every day | INTRAVENOUS | Status: DC
  Administered 2017-10-07 – 2017-10-15 (×9): 50 ug via INTRAVENOUS
  Administered 2017-10-16: 12:00:00 via INTRAVENOUS
  Administered 2017-10-17 – 2017-10-22 (×6): 50 ug via INTRAVENOUS
  Filled 2017-10-06 (×16): qty 5

## 2017-10-06 MED ORDER — MIDAZOLAM HCL 2 MG/2ML IJ SOLN
1.0000 mg | Freq: Once | INTRAMUSCULAR | Status: AC
  Administered 2017-10-06: 1 mg via INTRAVENOUS

## 2017-10-06 MED ORDER — CHLORHEXIDINE GLUCONATE 0.12% ORAL RINSE (MEDLINE KIT)
15.0000 mL | Freq: Two times a day (BID) | OROMUCOSAL | Status: DC
  Administered 2017-10-06 – 2017-10-07 (×2): 15 mL via OROMUCOSAL

## 2017-10-06 MED ORDER — MIDAZOLAM HCL 2 MG/2ML IJ SOLN
INTRAMUSCULAR | Status: AC
  Administered 2017-10-06: 1 mg via INTRAVENOUS
  Filled 2017-10-06: qty 2

## 2017-10-06 MED ORDER — ROCURONIUM BROMIDE 50 MG/5ML IV SOLN
40.0000 mg | Freq: Once | INTRAVENOUS | Status: AC
  Administered 2017-10-06: 40 mg via INTRAVENOUS

## 2017-10-06 MED ORDER — ASPIRIN 325 MG PO TABS
325.0000 mg | ORAL_TABLET | Freq: Every day | ORAL | Status: DC
  Administered 2017-10-06 – 2017-10-14 (×9): 325 mg via ORAL
  Filled 2017-10-06 (×9): qty 1

## 2017-10-06 MED ORDER — FENTANYL CITRATE (PF) 100 MCG/2ML IJ SOLN
INTRAMUSCULAR | Status: AC
  Administered 2017-10-06: 100 ug via INTRAVENOUS
  Filled 2017-10-06: qty 2

## 2017-10-06 MED ORDER — ORAL CARE MOUTH RINSE
15.0000 mL | OROMUCOSAL | Status: DC
Start: 2017-10-06 — End: 2017-10-07
  Administered 2017-10-06 – 2017-10-07 (×8): 15 mL via OROMUCOSAL

## 2017-10-06 MED ORDER — ETOMIDATE 2 MG/ML IV SOLN
20.0000 mg | Freq: Once | INTRAVENOUS | Status: AC
  Administered 2017-10-06: 20 mg via INTRAVENOUS

## 2017-10-06 MED ORDER — VANCOMYCIN HCL IN DEXTROSE 1-5 GM/200ML-% IV SOLN
1000.0000 mg | INTRAVENOUS | Status: DC
  Administered 2017-10-07 – 2017-10-08 (×2): 1000 mg via INTRAVENOUS
  Filled 2017-10-06 (×3): qty 200

## 2017-10-06 MED ORDER — VANCOMYCIN HCL 10 G IV SOLR
1250.0000 mg | Freq: Once | INTRAVENOUS | Status: AC
  Administered 2017-10-06: 1250 mg via INTRAVENOUS
  Filled 2017-10-06: qty 1250

## 2017-10-06 MED ORDER — SODIUM CHLORIDE 0.9 % IV BOLUS (SEPSIS)
500.0000 mL | Freq: Once | INTRAVENOUS | Status: AC
  Administered 2017-10-06: 500 mL via INTRAVENOUS

## 2017-10-06 MED ORDER — PROPOFOL 1000 MG/100ML IV EMUL
5.0000 ug/kg/min | INTRAVENOUS | Status: DC
  Administered 2017-10-06 – 2017-10-07 (×3): 20 ug/kg/min via INTRAVENOUS
  Administered 2017-10-08 (×2): 25 ug/kg/min via INTRAVENOUS
  Administered 2017-10-09 (×3): 30 ug/kg/min via INTRAVENOUS
  Filled 2017-10-06 (×9): qty 100

## 2017-10-06 MED ORDER — POTASSIUM CHLORIDE 20 MEQ/15ML (10%) PO SOLN
40.0000 meq | Freq: Once | ORAL | Status: AC
  Administered 2017-10-06: 40 meq
  Filled 2017-10-06: qty 30

## 2017-10-06 MED ORDER — OSMOLITE 1.2 CAL PO LIQD
1000.0000 mL | ORAL | Status: DC
  Administered 2017-10-06: 1000 mL
  Filled 2017-10-06: qty 1000

## 2017-10-06 MED ORDER — FENTANYL CITRATE (PF) 100 MCG/2ML IJ SOLN
100.0000 ug | Freq: Once | INTRAMUSCULAR | Status: AC
  Administered 2017-10-06: 100 ug via INTRAVENOUS

## 2017-10-06 NOTE — Progress Notes (Signed)
eLink Physician-Brief Progress Note Patient Name: Ashlee ParaDebra A Snow DOB: 10/08/1956 Ashlee Snow: [id number]   Date of Service  10/06/2017  HPI/Events of Note  Hypernatremia Hypokalemia  eICU Interventions  Free water via OGT Potassium replaced     Intervention Category Intermediate Interventions: Electrolyte abnormality - evaluation and management  Nastasia Kage 10/06/2017, 10:46 PM

## 2017-10-06 NOTE — Progress Notes (Signed)
PULMONARY / CRITICAL CARE MEDICINE   Name: Ashlee Snow MRN: [id number] DOB: 09/16/1956    ADMISSION DATE:  10/19/2017   CHIEF COMPLAINT: Fever and nausea  HISTORY OF PRESENT ILLNESS:   This is a 61 year old with a past medical history of fibromyalgia, hypertension, and hypothyroidism who for several days prior to admission had apparently been suffering from nausea and vomiting.  She subsequently had fevers and was evaluated at an outside hospital where she was found to have left hydronephrosis.  A ureteral stent was placed and she was treated for urosepsis with antibiotics and generous fluids.  She has grown MSSA from her blood.  [facility] sample that they had for culture was contaminated, so we have no culture of her urine on presentation..  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   Her oxygen requirements increased substantially on 12/10 and Levaquin  was added to nafcillin to cover for presumed aspiration.  Zosyn was subsequently substituted for both. Her oxygen requirements have improved, but her mental status has not.  Today she is febrile.  She is having liquid stool.  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  PAST SURGICAL HISTORY: She  has a past surgical history that includes Cholecystectomy (01/22/1997); Abdominal hysterectomy; Ovarian cyst surgery; aortogram w/ stenting (2007, 2011, Aug. 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  Allergies  Allergen Reactions  . Ativan [Lorazepam] Other (See Comments)    Patient's husband said related to being oversedated not true allergy    No current facility-administered medications on file prior to encounter.    Current Outpatient Medications on File Prior to Encounter  Medication Sig  . BLACK COHOSH PO Take 3 capsules by mouth daily.   . ergocalciferol (VITAMIN D2) 50000 units capsule Take 50,000 Units by mouth once a week. Monday  . HM ASPIRIN 81 MG chewable tablet Chew 81 mg by mouth 2 (two) times daily.   Marland Kitchen. levothyroxine (SYNTHROID, LEVOTHROID) 75 MCG tablet Take 75 mcg by mouth daily before breakfast.  . LINZESS 145 MCG CAPS capsule Take 145 mcg by mouth daily.  Marland Kitchen. lisinopril (PRINIVIL,ZESTRIL) 20 MG tablet Take 20 mg by mouth every morning.   Marland Kitchen. LYRICA 150 MG capsule Take 150 mg by mouth 2 (two) times daily.  . methocarbamol (ROBAXIN) 750 MG tablet Take 750 mg by mouth 3 (three) times daily.   . metoprolol succinate (TOPROL-XL) 25 MG 24 hr tablet Take 25 mg by mouth every morning.  Marland Kitchen. oxyCODONE-acetaminophen (PERCOCET) 10-325 MG tablet Take 1 tablet by mouth every 6 (six) hours as needed for pain. (Patient taking differently: Take 1 tablet by mouth 4 (four) times daily. )  . pravastatin (PRAVACHOL) 20 MG tablet Take 20 mg by mouth every morning.   . promethazine (PHENERGAN) 25 MG tablet Take 1 tablet by mouth every 6 (six) hours as needed.  . venlafaxine XR (EFFEXOR-XR) 75 MG 24 hr capsule Take 150 mg every morning by mouth.     FAMILY HISTORY:  Her indicated that her  mother is deceased. She indicated that her father is deceased. She indicated that her sister is deceased. She indicated that the status of her neg hx is unknown.   SOCIAL HISTORY: She  reports that she has been smoking e-cigarettes.  She started smoking about 42 years ago. She has been smoking about 1.00  pack per day. she has never used smokeless tobacco. She reports that she does not drink alcohol or use drugs.  REVIEW OF SYSTEMS:   Unobtainable  SUBJECTIVE:  Unobtainable  VITAL SIGNS: BP (!) 174/76   Pulse 96   Temp (!) 100.9 F (38.3 C) (Axillary) Comment: tylenol given  Resp (!) 30   Ht 5\' 4"  (1.626 m)   Wt 141 lb (64 kg)   SpO2 100%   BMI 24.20 kg/m       INTAKE / OUTPUT: I/O last 3 completed shifts: In: 4992.8 [I.V.:2270.3; NG/GT:2572.5; IV Piggyback:150] Out: 1308 [MVHQI:[id number] [Urine:1485; Stool:500]  PHYSICAL EXAMINATION: General: She is constantly moaning but not in overt distress.     Neuro: She moans almost constantly.  She does follow some simple instructions.  She is moving all fours.  Her face is symmetric, pupils are equal, she may have some protrusion of the tongue to the right. Cardiovascular: S1 and S2 are regular without murmur rub or gallop. Lungs: Respirations are unlabored.  There are lots of scattered rhonchi bilaterally.    Air movement is symmetrical. Abdomen: Abdomen is soft without organomegaly masses tenderness guarding or rebound.  LABS:  BMET Recent Labs  Lab 10/04/17 0500 09/29/2017 0523 10/06/17 0621  NA 165* 168* 157*  K 2.7* 3.2* 5.2*  CL >130* >130* >130*  CO2 19* 19* 18*  BUN 27* 34* 23*  CREATININE 1.64* 2.01* 1.43*  GLUCOSE 124* 128* 563*    Electrolytes Recent Labs  Lab 10/04/17 0500 10/04/17 1520 09/25/2017 0523 10/08/2017 1709 10/06/17 0621  CALCIUM 8.3*  --  7.9*  --  7.0*  MG 2.1 2.2 2.2 2.2 2.1  PHOS  --  3.5 4.0 2.8  --     CBC Recent Labs  Lab 10/03/17 0716 10/04/17 0500 10/06/17 0621  WBC 6.0 8.5 8.7  HGB 11.6* 12.2 9.5*  HCT 35.6* 36.8 29.8*  PLT 96* 128* 135*    Coag's Recent Labs  Lab 10/18/2017 2104  APTT 33  INR 1.08    Sepsis Markers Recent Labs  Lab 10/01/17 0420 10/02/17 0839 10/02/17 1137  10/03/17 1017 10/04/17 0500 10/12/2017 0523  LATICACIDVEN 3.1* 1.6 1.4  --   --   --   --    PROCALCITON  --   --   --    < > 12.94 6.44 <0.10   < > = values in this interval not displayed.    ABG Recent Labs  Lab 10/06/17 0909  PHART 7.503*  PCO2ART 18.6*  PO2ART 61.0*    Liver Enzymes Recent Labs  Lab 10/01/17 0333  AST 183*  ALT 72*  ALKPHOS 50  BILITOT 0.6  ALBUMIN 2.7*    Cardiac Enzymes Recent Labs  Lab 10/01/17 0317 10/01/17 0420 10/01/17 1730  TROPONINI 1.04* 1.07* 0.58*    Glucose Recent Labs  Lab 10/01/2017 1119 10/11/2017 1600 10/17/2017 2002 10/22/2017 2323 10/06/17 0326 10/06/17 0839  GLUCAP 149* 186* 152* 117* 145* 157*    Imaging [place] Renal  Result Date: 10/10/2017 CLINICAL DATA:  Decreased urinary output. The patient has a stent in the right kidney with due to history of right-sided hydronephrosis. EXAM: RENAL / URINARY TRACT  ULTRASOUND COMPLETE COMPARISON:  Fluoro spot radiograph of September 30, 2017 revealing the right ureteral stent also CT scan the abdomen pelvis of August 30, 2017 FINDINGS: Right Kidney: Length: 10.6 cm none. There is no hydronephrosis. The renal cortical echotexture remains lower than that of the liver. The stent is faintly visible. Left Kidney: Length: 9.4 cm. The cortical echotexture on the left is similar to that on the right. There is no focal mass nor hydronephrosis. Bladder: A Foley catheter is present within the bladder. The bladder portion of the stent is not clearly evident. IMPRESSION: Relief of the hydronephrosis on the right due to stent placement. No left-sided hydronephrosis. Normal renal cortical echotexture. A Foley catheter balloon is visible within the urinary bladder. Electronically Signed   By: David  [place] M.D.   On: 10/20/2017 17:02       ANTIBIOTICS: Levaquin and nafcillin   DISCUSSION: This is a 61 year old with a history of hypertension, fibromyalgia, peripheral vascular disease, and prior CVA who was treated for a UTI and hydronephrosis with placement of a left ureteral stent.  She had  persistent difficulties with nausea and vomiting and a head CT has shown a right cerebellar infarct.  She has grown MSSA from her blood.  Today she is hyperventilating and febrile.  ASSESSMENT / PLAN:  PULMONARY A: A chest x-ray is pending to determine whether or not there is an infiltrate accounting for her hyperventilation and fever.  Should the source of her fever proved to be an infiltrate I will intubate the patient in part in order to obtain an adequate microbiological sample in part to prevent further aspiration. CARDIOVASCULAR A: She was switched to Norvasc and as needed labetalol for blood pressure control last night out of respect for her declining renal function.      INFECTIOUS A: Urinary tract infection, and subsequently likely aspiration.  She was switched to Zosyn to cover both MSSA and the blood, aspiration, and potential urinary tract infection.   I am a little concerned that MSSA is a unusual organism for urinary tract infection and we did not isolate that organism from the urine.  Also concerned that she has had bypass surgery in the past and likely has a Gore-Tex graft in place.  She may require protracted therapy for her staph bacteremia.  A TEE to evaluate for potential MSSA endocarditis accounting for her bacteremia and stroke is still pending.  She has no fever today with legitimate concerns about urinary tract infection, aspiration, or even C. difficile.  If she has a new infiltrate I will be intubating the patient today in order to obtain better samples and prevent further aspiration until her mental status improves. NEUROLOGIC A: Status remains poor.  In addition to her fixed structural insult, she is profoundly hypernatremic and febrile .  Her hypernatremia is being corrected at an appropriate rate and we are reevaluating her fever as noted.  Declining renal function.  Improving with mild hydration and discontinuation of her ACE inhibitor.    Penny Pia, MD Pulmonary and  Critical Care Medicine [facility] Pager: [phone]  10/06/2017, 10:08 AM

## 2017-10-06 NOTE — Procedures (Signed)
Endotracheal intubation  Indication: Respiratory extremis  The patient was intubated for respiratory extremis.  The patient's husband was informed and a timeout was performed identifying the patient.  Because of her posterior fossa infarct heavy sedation was used for the intubation.  She was initially treated with 100 mcg of fentanyl and 1 mg of Versed followed by 20 mg of etomidate and 40 mg of rocuronium.  The patient was bagged and at no point had a saturation below 94%.  There were a great deal of secretions in the upper airway however the cords were easily visualized and a 7.5 endotracheal tube placed to 21 cm at the lip without difficulty.  There was positive CO2 and symmetric air movement.  Chest x-ray to confirm tube placement is pending.

## 2017-10-06 NOTE — Progress Notes (Signed)
Pharmacy Antibiotic Note  Ashlee Snow is a 61 y.o. female admitted on 10/11/2017 with urosepsis and R cerebellar infarction.  Now with MSSA bacteremia and possible aspiration pneumonia. Pt was switched from nafcillin to zosyn/vacnomycin to treat pneumonia, then will be transitioned back to nafcillin for the remainder of treatment.   Plan: -Vancomycin 1250 mg IV x1 then 1g/24h -Zosyn 3.375 g IV q8h -Monitor renal fx, cultures, VT at Css    Height: 5\' 4"  (162.6 cm) Weight: 141 lb (64 kg) IBW/kg (Calculated) : 54.7  Temp (24hrs), Avg:100.6 F (38.1 C), Min:98.9 F (37.2 C), Max:102.4 F (39.1 C)  Recent Labs  Lab 10/16/2017 2035 10/01/17 0317 10/01/17 0333 10/01/17 0420 10/02/17 0616 10/02/17 0839 10/02/17 1137 10/03/17 0716 10/04/17 0500 10/21/2017 0523 10/06/17 0621  WBC 7.0  --  6.5  --  5.3  --   --  6.0 8.5  --  8.7  CREATININE  --   --  1.57*  --  1.30*  --   --   --  1.64* 2.01* 1.43*  LATICACIDVEN 6.5* 3.3*  --  3.1*  --  1.6 1.4  --   --   --   --     Estimated Creatinine Clearance: 36.6 mL/min (A) (by C-G formula based on SCr of 1.43 mg/dL (H)).    Allergies  Allergen Reactions  . Ativan [Lorazepam] Other (See Comments)    Patient's husband said related to being oversedated not true allergy    Antimicrobials this admission: Vancomycin 12/8 >> 12/10 Meropenem 12/8 >> 12/10 Nafcillin 12/10 >> 12/12 Levaquin 12/10 >> 12/12 Zosyn 12/12 >> Vancomycin 12/14 >>  Dose adjustments this admission: N/A  Microbiology results: 12/8 BCx: MSSA 12/8 UCx: canceled 12/8 MRSA PCR: negative 12/11 blood cx: ngtd 12/14 cdiff: neg    Baldemar FridayMasters, Ashlin Kreps M 10/06/2017 12:40 PM

## 2017-10-06 NOTE — Consult Note (Signed)
Physical Medicine and Rehabilitation Consult Reason for Consult: Decreased functional mobility Referring Physician: Critical care   HPI: Ashlee Snow is a 61 y.o. right handed female with history of hypertension, CAD, tobacco abuse, chronic pain with fibromyalgia, peripheral vascular disease status post aortobifemoral bypass in June. Per chart review patient lives with spouse independent prior to admission. Presented August 20, 2017 with altered mental status, right flank pain and nausea and vomiting. Presented to the ED with low-grade fever, tachycardic at 150, EKG sinus tachycardia rate 141 nonspecific T-wave abnormality in lateral leads. Chest x-ray negative. CT abdomen and pelvis notable for right-sided hydronephrosis and proximal right hydroureter without definite calculus raising concern for stricture. CT of the head decreased attenuation throughout the posterior mid right cerebellum likely representing acute infarction. Noted hypokalemia 3.1, creatinine 1.91, WBC 13,700. Urine study large bacteria positive nitrite. Lactic acid elevated 11.9. Placed on broad-spectrum antibiotics for suspect sepsis. Initially urology consult did for hydronephrosis underwent right ureteral stent placement 10/01/2017 per Dr. Retta Dionesahlstedt. 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 Additional scattered punctate foci of acute nonhemorrhagic infarct right PCA territory. Multiple foci of remote encephalomalacia involving the inferior posterior right temporal lobe, lateral right temporal lobe in high right parietal lobe. MRA with high-grade stenosis or occlusion of the cavernous right internal carotid artery. EEG negative for seizure. Neurosurgery Dr. Wynetta Emeryram follow-up did not feel any surgical intervention needed. Placed on 3% saline. Infectious disease consulted for MSSA bacteremia. Echocardiogram with ejection fraction 65% grade 1 diastolic  dysfunction. Attempts at TEE 10/18/2017 patient with sodium level CLXVIII altered mental status with TEE canceled and plan to reschedule. Neurology follow-up currently on aspirin for CVA prophylaxis. Patient is NPO with alternative means of nutritional support. Physical therapy evaluation completed and ongoing with recommendations of physical medicine rehabilitation consult.   Review of Systems  Unable to perform ROS: Acuity of condition   Past Medical History:  Diagnosis Date  . Asthma   . Bronchitis   . DDD (degenerative disc disease)   . Depression   . Dyspnea    on exertion  . Fibromyalgia   . GERD (gastroesophageal reflux disease)    hx. of   . History of depression   . Hypertension   . Hypothyroidism   . Leg pain   . Migraine   . Peripheral arterial disease (HCC)   . Peripheral vascular disease (HCC)   . Sciatica   . 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 Thyroid disease   . 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 Ulcer    Past Surgical History:  Procedure Laterality Date  . ABDOMINAL HYSTERECTOMY    . AORTA - BILATERAL FEMORAL ARTERY BYPASS GRAFT Bilateral 04/14/2017   Procedure: AORTA BIFEMORAL BYPASS GRAFT WITH HEMASHIELD GOLD BIFURCATED 14MMX7 MM GRAFT;  Surgeon: Nada LibmanBrabham, Vance W, MD;  Location: [facility] OR;  Service: Vascular;  Laterality: Bilateral;  . aortogram w/ stenting  2007, 2011, Aug. 2012  . BACK SURGERY  2016   lumbar surgery  . CHOLECYSTECTOMY  01/22/1997   Gall Bladder  . COLON SURGERY  1982   exploratory surgery  . COLONOSCOPY WITH ESOPHAGOGASTRODUODENOSCOPY (EGD)  2010   Dr. Jena Gaussourk: EGD with small hiatal hernia, small bowel biopsy negative, colonoscopy with hyperplastic polyp   . MANDIBLE FRACTURE SURGERY     TMJ surgery - right side  . OVARIAN CYST SURGERY    . TUBAL LIGATION  Family History  Problem Relation Age of Onset  . Heart disease Mother   . Cancer Mother        lung  . Arthritis Mother   .  Diabetes Mother   . Stroke Mother   . Hypertension Mother   . Heart disease Father   . Arthritis Father   . Heart failure Father   . Hypertension Father   . Stroke Father   . Peripheral vascular disease Father   . Heart disease Sister   . Arthritis Sister   . Kidney disease Sister   . Stroke Sister   . Fibromyalgia Sister   . Colon cancer Neg Hx    Social History:  reports that she has been smoking e-cigarettes.  She started smoking about 42 years ago. She has been smoking about 1.00 pack per day. she has never used smokeless tobacco. She reports that she does not drink alcohol or use drugs. Allergies:  Allergies  Allergen Reactions  . Ativan [Lorazepam] Other (See Comments)    Patient's husband said related to being oversedated not true allergy   Medications Prior to Admission  Medication Sig Dispense Refill  . BLACK COHOSH PO Take 3 capsules by mouth daily.     . ergocalciferol (VITAMIN D2) 50000 units capsule Take 50,000 Units by mouth once a week. Monday    . HM ASPIRIN 81 MG chewable tablet Chew 81 mg by mouth 2 (two) times daily.   1  . levothyroxine (SYNTHROID, LEVOTHROID) 75 MCG tablet Take 75 mcg by mouth daily before breakfast.    . LINZESS 145 MCG CAPS capsule Take 145 mcg by mouth daily.  0  . lisinopril (PRINIVIL,ZESTRIL) 20 MG tablet Take 20 mg by mouth every morning.     Marland Kitchen LYRICA 150 MG capsule Take 150 mg by mouth 2 (two) times daily.  1  . methocarbamol (ROBAXIN) 750 MG tablet Take 750 mg by mouth 3 (three) times daily.     . metoprolol succinate (TOPROL-XL) 25 MG 24 hr tablet Take 25 mg by mouth every morning.    Marland Kitchen oxyCODONE-acetaminophen (PERCOCET) 10-325 MG tablet Take 1 tablet by mouth every 6 (six) hours as needed for pain. (Patient taking differently: Take 1 tablet by mouth 4 (four) times daily. ) 30 tablet 0  . pravastatin (PRAVACHOL) 20 MG tablet Take 20 mg by mouth every morning.     . promethazine (PHENERGAN) 25 MG tablet Take 1 tablet by mouth every 6  (six) hours as needed.  2  . venlafaxine XR (EFFEXOR-XR) 75 MG 24 hr capsule Take 150 mg every morning by mouth.   1    Home: Home Living Family/patient expects to be discharged to:: Inpatient rehab Available Help at Discharge: Family, Available 24 hours/day Type of Home: House Additional Comments: pt was at home with spouse, indep without AD. was driving, performing ADLs, amb without AD, cooking, cleaning  Functional History: Prior Function Level of Independence: Independent Comments: Husband reports she stayed in bed/slept 80% of day due to pain and pain meds for back; but she was able to totally take care of herself from a basic ADL standpoint and get her own food Functional Status:  Mobility: Bed Mobility Overal bed mobility: Needs Assistance Bed Mobility: Rolling, Sidelying to Sit Rolling: Max assist Sidelying to sit: Max assist Sit to supine: Total assist, +2 for physical assistance Sit to sidelying: +2 for physical assistance, Max assist General bed mobility comments: max assist to rotate trunk and pelvis and elevate trunk  with bringing legs to EOB Transfers Overall transfer level: Needs assistance Equipment used: (2 person lift with gait belt and bed pad) Transfers: Sit to/from Stand, Stand Pivot Transfers Sit to Stand: Mod assist, +2 physical assistance, +2 safety/equipment Stand pivot transfers: +2 physical assistance, Max assist General transfer comment: pt with LLE off the floor with standing and pt placing LLE posterior to RLE despite cues. Stood x 2 with use of belt and pad with max multimodal cueing and assist for balance and rise. Max assist to pivot to control balance and turn Ambulation/Gait General Gait Details: unable    ADL: ADL Overall ADL's : Needs assistance/impaired Grooming: Maximal assistance, Sitting, Cueing for sequencing, Cueing for safety, Oral care Grooming Details (indicate cue type and reason): Pt performing oral care with supported sitting in  recliner. Pt requiring Max A to intitiation hold of mouth swab and bring hand to mouth. Providing support at elbow and hand over hand to grasp. Once initaitioned pt performing back and forth movements to brush tongue and teeth. Pt requiring tactile and verbal cues to attend to L side and wash L side of mouth. Pt needing Max cues to terminate task Lower Body Dressing: Total assistance Lower Body Dressing Details (indicate cue type and reason): total A to manage socks Functional mobility during ADLs: +2 for physical assistance, Maximal assistance, Cueing for sequencing, Cueing for safety General ADL Comments: Pt performing oral care while supported in sitting. Focused ADLs on attention, problem solving, and attending to L side of visual field. Pt requiring Max cues to attend to L side. Pt requiring Max A +2 to stand pivot to EOB and return to supine. Pt with significant R eye gaze and head turn preferance  Cognition: Cognition Overall Cognitive Status: Difficult to assess Arousal/Alertness: Lethargic Orientation Level: Oriented to person, Oriented to place Attention: Focused Focused Attention: Impaired Focused Attention Impairment: Verbal basic, Functional basic Cognition Arousal/Alertness: Awake/alert Behavior During Therapy: Flat affect Overall Cognitive Status: Difficult to assess Area of Impairment: Following commands Current Attention Level: Sustained Following Commands: Follows one step commands inconsistently Safety/Judgement: Decreased awareness of safety, Decreased awareness of deficits Awareness: Intellectual Problem Solving: Slow processing, Decreased initiation, Difficulty sequencing, Requires verbal cues, Requires tactile cues General Comments: pt moaning throughout session without any word production to questions or commands. Pt attempted to pick of RUE to use comb and brush tongue but could not reach object however spontaneously put right hand on head when hair brushed  Blood  pressure (!) 151/71, pulse 96, temperature 99.6 F (37.6 C), temperature source Oral, resp. rate (!) 28, height 5\' 4"  (1.626 m), weight 64 kg (141 lb), SpO2 100 %. Physical Exam  HENT:  Head: Normocephalic.  Eyes:  Pupils sluggish but reactive to light. Nasogastric tube in place  Neck: Normal range of motion. Neck supple. No thyromegaly present.  Cardiovascular:  Cardiac rate controlled  Respiratory:  Minimal inspiratory effort clear to auscultation.  GI: Soft. Bowel sounds are normal. She exhibits no distension.  Neurological:  Patient lethargic and difficult to arouse. She remained nonverbal. She did not follow commands during exam. Withdraws to deep stimuli.  Patient moves all 4 limbs.  Right hand is shaking along with leg  Skin: Skin is warm and dry.    Results for orders placed or performed during the hospital encounter of 10/06/2017 (from the past 24 hour(s))  Glucose, capillary     Status: None   Collection Time: 16-Oct-2017  7:43 AM  Result Value Ref Range   Glucose-Capillary  96 65 - 99 mg/dL  Glucose, capillary     Status: Abnormal   Collection Time: 18-Jun-2017 11:19 AM  Result Value Ref Range   Glucose-Capillary 149 (H) 65 - 99 mg/dL  Glucose, capillary     Status: Abnormal   Collection Time: 18-Jun-2017  4:00 PM  Result Value Ref Range   Glucose-Capillary 186 (H) 65 - 99 mg/dL  Magnesium     Status: None   Collection Time: 18-Jun-2017  5:09 PM  Result Value Ref Range   Magnesium 2.2 1.7 - 2.4 mg/dL  Phosphorus     Status: None   Collection Time: 18-Jun-2017  5:09 PM  Result Value Ref Range   Phosphorus 2.8 2.5 - 4.6 mg/dL  Glucose, capillary     Status: Abnormal   Collection Time: 18-Jun-2017  8:02 PM  Result Value Ref Range   Glucose-Capillary 152 (H) 65 - 99 mg/dL   Comment 1 Notify RN    Comment 2 Document in Chart   Glucose, capillary     Status: Abnormal   Collection Time: 18-Jun-2017 11:23 PM  Result Value Ref Range   Glucose-Capillary 117 (H) 65 - 99 mg/dL   Comment 1  Notify RN    Comment 2 Document in Chart   Glucose, capillary     Status: Abnormal   Collection Time: 10/06/17  3:26 AM  Result Value Ref Range   Glucose-Capillary 145 (H) 65 - 99 mg/dL   Comment 1 Notify RN    Comment 2 Document in Chart    [place] Renal  Result Date: 2017-08-16 CLINICAL DATA:  Decreased urinary output. The patient has a stent in the right kidney with due to history of right-sided hydronephrosis. EXAM: RENAL / URINARY TRACT ULTRASOUND COMPLETE COMPARISON:  Fluoro spot radiograph of September 30, 2017 revealing the right ureteral stent also CT scan the abdomen pelvis of August 30, 2017 FINDINGS: Right Kidney: Length: 10.6 cm none. There is no hydronephrosis. The renal cortical echotexture remains lower than that of the liver. The stent is faintly visible. Left Kidney: Length: 9.4 cm. The cortical echotexture on the left is similar to that on the right. There is no focal mass nor hydronephrosis. Bladder: A Foley catheter is present within the bladder. The bladder portion of the stent is not clearly evident. IMPRESSION: Relief of the hydronephrosis on the right due to stent placement. No left-sided hydronephrosis. Normal renal cortical echotexture. A Foley catheter balloon is visible within the urinary bladder. Electronically Signed   By: David  SwazilandJordan M.D.   On: 2017-08-16 17:02    Assessment/Plan: Diagnosis: Right cerebellar infarct/hemorrhage 1. Does the need for close, 24 hr/day medical supervision in concert with the patient's rehab needs make it unreasonable for this patient to be served in a less intensive setting? Potentially 2. Co-Morbidities requiring supervision/potential complications: Acute kidney injury, septicemia, encephalopathy 3. Due to bladder management, bowel management, safety, skin/wound care, disease management, medication administration, pain management and patient education, does the patient require 24 hr/day rehab nursing? Potentially 4. Does the patient  require coordinated care of a physician, rehab nurse, PT (1-2 hrs/day, 5 days/week), OT (1-2 hrs/day, 5 days/week) and SLP (1-2 hrs/day, 5 days/week) to address physical and functional deficits in the context of the above medical diagnosis(es)? Potentially Addressing deficits in the following areas: balance, endurance, locomotion, strength, transferring, bowel/bladder control, bathing, dressing, feeding, grooming, toileting, cognition, speech, swallowing and psychosocial support 5. Can the patient actively participate in an intensive therapy program of at least 3 hrs of  therapy per day at least 5 days per week? Potentially 6. The potential for patient to make measurable gains while on inpatient rehab is good and fair 7. Anticipated functional outcomes upon discharge from inpatient rehab are min assist and mod assist  with PT, min assist and mod assist with OT, supervision, min assist and mod assist with SLP. 8. Estimated rehab length of stay to reach the above functional goals is: Potentially 20-27 days 9. Anticipated D/C setting: Home 10. Anticipated post D/C treatments: HH therapy and Outpatient therapy 11. Overall Rehab/Functional Prognosis: good  RECOMMENDATIONS: This patient's condition is appropriate for continued rehabilitative care in the following setting: See below Patient has agreed to participate in recommended program. N/A Note that insurance prior authorization may be required for reimbursement for recommended care.  Comment: Patient will need to demonstrate increased capacity to participate in therapy.  We will follow along for neurological and functional progress  Ranelle Oyster, MD, [facility] Physical Medicine & Rehabilitation 10/06/2017    Mcarthur Rossetti Angiulli, PA-C 10/06/2017

## 2017-10-06 NOTE — Progress Notes (Addendum)
INFECTIOUS DISEASE PROGRESS NOTE  ID: Ashlee Snow is a 61 y.o. female with  Principal Problem:   Sepsis (HCC) Active Problems:   Stroke (cerebrum) (HCC)   AKI (acute kidney injury) (HCC)   Obstructive pyelonephritis   Thrombocytopenia (HCC)   Hypokalemia   Lactic acidosis   Encephalopathy acute   MRSA bacteremia   Aspiration into airway  Subjective: No response  Abtx:  Anti-infectives (From admission, onward)   Start     Dose/Rate Route Frequency Ordered Stop   10/04/17 1200  piperacillin-tazobactam (ZOSYN) IVPB 3.375 g     3.375 g 12.5 mL/hr over 240 Minutes Intravenous Every 8 hours 10/04/17 1118     10/03/17 2000  levofloxacin (LEVAQUIN) IVPB 500 mg  Status:  Discontinued     500 mg 100 mL/hr over 60 Minutes Intravenous Every 24 hours 10/03/17 1039 10/04/17 1128   10/02/17 2000  Levofloxacin (LEVAQUIN) IVPB 250 mg  Status:  Discontinued     250 mg 50 mL/hr over 60 Minutes Intravenous Every 24 hours 10/02/17 1818 10/03/17 1039   10/02/17 1600  nafcillin 2 g in dextrose 5 % 100 mL IVPB  Status:  Discontinued     2 g 200 mL/hr over 30 Minutes Intravenous Every 4 hours 10/02/17 1409 10/04/17 0955   10/01/17 2000  vancomycin (VANCOCIN) IVPB 750 mg/150 ml premix  Status:  Discontinued     750 mg 150 mL/hr over 60 Minutes Intravenous Every 24 hours 10/12/2017 2221 10/02/17 1214   10/01/17 0900  meropenem (MERREM) 1 g in sodium chloride 0.9 % 100 mL IVPB  Status:  Discontinued     1 g 200 mL/hr over 30 Minutes Intravenous Every 12 hours 10/11/2017 2221 10/02/17 1214   10/06/2017 2030  vancomycin (VANCOCIN) 1,250 mg in sodium chloride 0.9 % 250 mL IVPB     1,250 mg 166.7 mL/hr over 90 Minutes Intravenous  Once 10/10/2017 1957 10/23/2017 2214   10/14/2017 2030  meropenem (MERREM) 1 g in sodium chloride 0.9 % 100 mL IVPB     1 g 200 mL/hr over 30 Minutes Intravenous  Once 10/10/2017 1957 10/04/2017 2216      Medications:  Scheduled: .  stroke: mapping our early stages of recovery  book   Does not apply Once  . amLODipine  10 mg Per Tube Daily  . aspirin  325 mg Oral Daily  . chlorhexidine  15 mL Mouth Rinse BID  . Chlorhexidine Gluconate Cloth  6 each Topical Daily  . feeding supplement (PRO-STAT SUGAR FREE 64)  30 mL Per Tube Daily  . insulin aspart  0-9 Units Subcutaneous Q4H  . ipratropium-albuterol  3 mL Nebulization BID  . levothyroxine  37.5 mcg Intravenous Daily  . mouth rinse  15 mL Mouth Rinse q12n4p  . pantoprazole (PROTONIX) IV  40 mg Intravenous Q24H  . potassium chloride  20 mEq Oral Daily  . sodium chloride flush  10-40 mL Intracatheter Q12H  . thiamine injection  100 mg Intravenous Daily    Objective: Vital signs in last 24 hours: Temp:  [98.9 F (37.2 C)-102.4 F (39.1 C)] 100.9 F (38.3 C) (12/14 0800) Pulse Rate:  [82-101] 96 (12/14 0800) Resp:  [17-30] 30 (12/14 0900) BP: (109-174)/(28-126) 174/76 (12/14 0900) SpO2:  [95 %-100 %] 100 % (12/14 0800) Weight:  [64 kg (141 lb)] 64 kg (141 lb) (12/13 1246)   General appearance: moderate distress Resp: tachypnea Cardio: tachycardia GI: normal findings: bowel sounds normal and soft, non-tender Extremities: edema none  and RUE PIC: no d/c or tenderness or erythema.   Lab Results Recent Labs    10/04/17 0500 10/21/2017 0523 10/06/17 0621  WBC 8.5  --  8.7  HGB 12.2  --  9.5*  HCT 36.8  --  29.8*  NA 165* 168* 157*  K 2.7* 3.2* 5.2*  CL >130* >130* >130*  CO2 19* 19* 18*  BUN 27* 34* 23*  CREATININE 1.64* 2.01* 1.43*   Liver Panel No results for input(s): PROT, ALBUMIN, AST, ALT, ALKPHOS, BILITOT, BILIDIR, IBILI in the last 72 hours. Sedimentation Rate No results for input(s): ESRSEDRATE in the last 72 hours. C-Reactive Protein No results for input(s): CRP in the last 72 hours.  Microbiology: Recent Results (from the past 240 hour(s))  MRSA PCR Screening     Status: None   Collection Time: 09/27/2017  7:52 PM  Result Value Ref Range Status   MRSA by PCR NEGATIVE NEGATIVE Final     Comment:        The GeneXpert MRSA Assay (FDA approved for NASAL specimens only), is one component of a comprehensive MRSA colonization surveillance program. It is not intended to diagnose MRSA infection nor to guide or monitor treatment for MRSA infections.   Culture, blood (x 2)     Status: Abnormal   Collection Time: 10/17/2017  8:35 PM  Result Value Ref Range Status   Specimen Description BLOOD LEFT ANTECUBITAL  Final   Special Requests IN PEDIATRIC BOTTLE Blood Culture adequate volume  Final   Culture  Setup Time   Final    GRAM POSITIVE COCCI IN CLUSTERS IN PEDIATRIC BOTTLE CRITICAL VALUE NOTED.  VALUE IS CONSISTENT WITH PREVIOUSLY REPORTED AND CALLED VALUE.    Culture (A)  Final    STAPHYLOCOCCUS AUREUS SUSCEPTIBILITIES PERFORMED ON PREVIOUS CULTURE WITHIN THE LAST 5 DAYS.    Report Status 10/04/2017 FINAL  Final  Culture, blood (x 2)     Status: Abnormal   Collection Time: 10/09/2017  8:40 PM  Result Value Ref Range Status   Specimen Description BLOOD LEFT ANTECUBITAL  Final   Special Requests IN PEDIATRIC BOTTLE Blood Culture adequate volume  Final   Culture  Setup Time   Final    GRAM POSITIVE COCCI IN CLUSTERS IN PEDIATRIC BOTTLE CRITICAL RESULT CALLED TO, READ BACK BY AND VERIFIED WITH: B MANCHERIL,PHARMD AT 0825 10/02/17 BY L BENFIELD    Culture STAPHYLOCOCCUS AUREUS (A)  Final   Report Status 10/04/2017 FINAL  Final   Organism ID, Bacteria STAPHYLOCOCCUS AUREUS  Final      Susceptibility   Staphylococcus aureus - MIC*    CIPROFLOXACIN <=0.5 SENSITIVE Sensitive     ERYTHROMYCIN >=8 RESISTANT Resistant     GENTAMICIN <=0.5 SENSITIVE Sensitive     OXACILLIN <=0.25 SENSITIVE Sensitive     TETRACYCLINE <=1 SENSITIVE Sensitive     VANCOMYCIN <=0.5 SENSITIVE Sensitive     TRIMETH/SULFA <=10 SENSITIVE Sensitive     CLINDAMYCIN RESISTANT Resistant     RIFAMPIN <=0.5 SENSITIVE Sensitive     Inducible Clindamycin POSITIVE Resistant     * STAPHYLOCOCCUS AUREUS    Blood Culture ID Panel (Reflexed)     Status: Abnormal   Collection Time: 10/14/2017  8:40 PM  Result Value Ref Range Status   Enterococcus species NOT DETECTED NOT DETECTED Final   Listeria monocytogenes NOT DETECTED NOT DETECTED Final   Staphylococcus species DETECTED (A) NOT DETECTED Final    Comment: CRITICAL RESULT CALLED TO, READ BACK BY AND VERIFIED WITH: B  MANCHERIL,PHARMD AT 0825 10/02/17 BY L BENFIELD    Staphylococcus aureus DETECTED (A) NOT DETECTED Final    Comment: Methicillin (oxacillin) susceptible Staphylococcus aureus (MSSA). Preferred therapy is anti staphylococcal beta lactam antibiotic (Cefazolin or Nafcillin), unless clinically contraindicated. CRITICAL RESULT CALLED TO, READ BACK BY AND VERIFIED WITH: B MANCHERIL,PHARMD AT 0825 10/02/17 BY L BENFIELD    Methicillin resistance NOT DETECTED NOT DETECTED Final   Streptococcus species NOT DETECTED NOT DETECTED Final   Streptococcus agalactiae NOT DETECTED NOT DETECTED Final   Streptococcus pneumoniae NOT DETECTED NOT DETECTED Final   Streptococcus pyogenes NOT DETECTED NOT DETECTED Final   Acinetobacter baumannii NOT DETECTED NOT DETECTED Final   Enterobacteriaceae species NOT DETECTED NOT DETECTED Final   Enterobacter cloacae complex NOT DETECTED NOT DETECTED Final   Escherichia coli NOT DETECTED NOT DETECTED Final   Klebsiella oxytoca NOT DETECTED NOT DETECTED Final   Klebsiella pneumoniae NOT DETECTED NOT DETECTED Final   Proteus species NOT DETECTED NOT DETECTED Final   Serratia marcescens NOT DETECTED NOT DETECTED Final   Haemophilus influenzae NOT DETECTED NOT DETECTED Final   Neisseria meningitidis NOT DETECTED NOT DETECTED Final   Pseudomonas aeruginosa NOT DETECTED NOT DETECTED Final   Candida albicans NOT DETECTED NOT DETECTED Final   Candida glabrata NOT DETECTED NOT DETECTED Final   Candida krusei NOT DETECTED NOT DETECTED Final   Candida parapsilosis NOT DETECTED NOT DETECTED Final   Candida  tropicalis NOT DETECTED NOT DETECTED Final  Culture, blood (routine x 2)     Status: None (Preliminary result)   Collection Time: 10/03/17  9:40 AM  Result Value Ref Range Status   Specimen Description BLOOD LEFT HAND  Final   Special Requests IN PEDIATRIC BOTTLE Blood Culture adequate volume  Final   Culture NO GROWTH 2 DAYS  Final   Report Status PENDING  Incomplete  Culture, blood (routine x 2)     Status: None (Preliminary result)   Collection Time: 10/03/17  9:50 AM  Result Value Ref Range Status   Specimen Description BLOOD LEFT HAND  Final   Special Requests IN PEDIATRIC BOTTLE Blood Culture adequate volume  Final   Culture NO GROWTH 2 DAYS  Final   Report Status PENDING  Incomplete    Studies/Results: [facility]  Result Date: 09/28/2017 CLINICAL DATA:  Decreased urinary output. The patient has a stent in the right kidney with due to history of right-sided hydronephrosis. EXAM: RENAL / URINARY TRACT ULTRASOUND COMPLETE COMPARISON:  Fluoro spot radiograph of September 30, 2017 revealing the right ureteral stent also CT scan the abdomen pelvis of August 30, 2017 FINDINGS: Right Kidney: Length: 10.6 cm none. There is no hydronephrosis. The renal cortical echotexture remains lower than that of the liver. The stent is faintly visible. Left Kidney: Length: 9.4 cm. The cortical echotexture on the left is similar to that on the right. There is no focal mass nor hydronephrosis. Bladder: A Foley catheter is present within the bladder. The bladder portion of the stent is not clearly evident. IMPRESSION: Relief of the hydronephrosis on the right due to stent placement. No left-sided hydronephrosis. Normal renal cortical echotexture. A Foley catheter balloon is visible within the urinary bladder. Electronically Signed   By: David  SwazilandJordan M.D.   On: 10/04/2017 17:02     Assessment/Plan: MSSA bacteremia L ureteral stent aspiration pneumonia R cerebellar/cerebral CVA  Total days of antibiotics:  7  Zosyn day 3/5  Would continue zosyn to 12-18 then switch back to nafcillin Await her C  diff Possible intubation due to respiratory failure. Bal then Will add vanco TEE when able query if she has central fever?         Johny Sax MD, FACP Infectious Diseases (pager) [phone] [email or web address] 10/06/2017, 10:59 AM  LOS: 6 days

## 2017-10-06 NOTE — Progress Notes (Signed)
Rehab admissions - I am following for potential acute inpatient rehab admission.  Please see rehab MD consult done today by Dr. Riley KillSwartz.  I will follow along for progress and participation with therapies.  Call me for questions.  #[phone]

## 2017-10-06 NOTE — Progress Notes (Signed)
Order for Respiratory Sputum unable to get sputum at this time. Will keep attempting unable we have enough to send down to lab.

## 2017-10-06 NOTE — Progress Notes (Signed)
STROKE TEAM PROGRESS NOTE  Admission History:  Ashlee Snow is an 61 y.o. female presenting from OSH with large subacute right cerebellar ischemic infarction with mass effect and pyelonephritis.   She presented to the emergency department for evaluation of confusion, right flank pain, and nausea with vomiting. Patient had reportedly been complaining of right flank pain for a couple weeks now, was otherwise well until the afternoon of 09/28/2017 when her pain increased fairly acutely and she developed nausea with nonbloody vomiting. She did not get out of bed the following day due to her illness and became quite confused by that evening.She had refused to go to the emergency department, but with her condition persisting today, her husband overrode her decision and she was taken to the ED.  [facility] [place]:Upon arrival to the ED, patient is found to befebrile to 40.3 degree C, saturating well on room air, tachypneic to 30, tachycardic to 150, and with stable blood pressure. EKG features a sinus tachycardia with rate 141 and nonspecific T wave abnormality in the lateral leads. Chest x-ray is negative for acute cardiopulmonary disease. CT abdomen and pelvis is notable for right-sided hydronephrosis and proximal right hydroureter without definite calculus, raising concern for stricture/scar. Lactic acid was elevated to 11.9 the patient was treated with 2 L of lactated Ringer's and 2 g of IV Rocephin in the ED. A CT head was obtained at the OSH revealing a large subacute right cerebellar infarction with mass effect.   SUBJECTIVE (INTERVAL HISTORY)  Husband is not at the bedside. Patient is found sitting up in bed, .she    She remains confused and follows barely  few simple commands. She is working to breath and unable to cough out secretions.e. Serum sodium now down to 158 with 1/2 NS infusion/ TEE refused by cardiology due to poor general condition  OBJECTIVE Lab Results: CBC:  Recent Labs   Lab 10/03/17 0716 10/04/17 0500 10/06/17 0621  WBC 6.0 8.5 8.7  HGB 11.6* 12.2 9.5*  HCT 35.6* 36.8 29.8*  MCV 88.8 89.1 92.3  PLT 96* 128* 135*   BMP: Recent Labs  Lab 10/01/17 0333  10/02/17 0616  10/02/17 1730  10/03/17 0716 10/03/17 1017 10/03/17 1329 10/04/17 0500 10/04/17 1520 10/20/2017 0523 10/21/2017 1709 10/06/17 0621  NA 136   < > 151*  151*   < > 156*   < > 163*  --  164* 165*  --  168*  --  157*  K 3.4*  --  2.8*  --  3.0*  --   --  2.4*  --  2.7*  --  3.2*  --  5.2*  CL 107  --  121*  --   --   --   --   --   --  >130*  --  >130*  --  >130*  CO2 17*  --  18*  --   --   --   --   --   --  19*  --  19*  --  18*  GLUCOSE 153*  --  148*  --   --   --   --   --   --  124*  --  128*  --  563*  BUN 24*  --  20  --   --   --   --   --   --  27*  --  34*  --  23*  CREATININE 1.57*  --  1.30*  --   --   --   --   --   --  1.64*  --  2.01*  --  1.43*  CALCIUM 8.1*  --  7.5*  --   --   --   --   --   --  8.3*  --  7.9*  --  7.0*  MG  --   --  2.2  --   --   --   --   --   --  2.1 2.2 2.2 2.2 2.1  PHOS  --   --   --   --   --   --   --   --   --   --  3.5 4.0 2.8  --    < > = values in this interval not displayed.   Liver Function Tests:  Recent Labs  Lab 10/01/17 0333  AST 183*  ALT 72*  ALKPHOS 50  BILITOT 0.6  PROT 6.1*  ALBUMIN 2.7*   Recent Labs  Lab 10/01/17 0317  AMMONIA 36*   Cardiac Enzymes:  Recent Labs  Lab 10/01/17 0317 10/01/17 0420 10/01/17 1730  TROPONINI 1.04* 1.07* 0.58*   Coagulation Studies:  No results for input(s): APTT, INR in the last 72 hours. PHYSICAL EXAM Temp:  [98.9 F (37.2 C)-102.4 F (39.1 C)] 99.2 F (37.3 C) (12/14 1600) Pulse Rate:  [82-103] 88 (12/14 1700) Resp:  [17-32] 32 (12/14 1800) BP: (109-174)/(28-126) 127/114 (12/14 1800) SpO2:  [94 %-100 %] 100 % (12/14 1700) FiO2 (%):  [40 %-100 %] 40 % (12/14 1523) General - Well nourished, well developed,  Respiratory - Tachypneic. . Cardiovascular - Regular rate  and rhythm   Neurologic Examination: Mental Status: Awake. Impaired attention. Speech output is minimal and hypophonic, dysarthric, fragmentary and barely intelligible. Able to say "water". Not oriented.  Abulic. Fatigued-appearing. Does not fixate well on visual stimuli.  Cranial Nerves: II:  No blink to threat on the left. Intact blink to threat on the right. Right pupil 4 mm and reactive. Left pupil 6 mm and sluggishly reactive.  III,IV, VI: Saccadic dysmetria right greater than left but smooth pursuits bilaterally. Crosses midline bilaterally with conjugate EOM, but will not gaze fully to left or right. No nystagmus.  V,VII: No definite facial droop. Reacts to tactile stimulation bilaterally.  VIII: Hearing intact to voice IX,X: Hypophonic speech.   XI: Head at midline XII: midline tongue extension  Motor/Sensory: RUE: Biceps, triceps, deltoid and grip 2-3/5 LUE: Biceps, triceps, deltoid and grip 2-3/5 RLE:   Withdraws from noxious with 3-4/5 strength.  LLE:   Withdraws from noxious with 2/5 strength.  Deep Tendon Reflexes:  Brisk, low-amplitude 3+ reflexes throughout. Toes equivocal bilaterally.  Cerebellar: unable to perform  Gait: not tested  IMAGING: I have personally reviewed the radiological images below and agree with the radiology interpretations. Ct Head Wo Contrast Result Date: 10/01/2017 IMPRESSION: No significant change since yesterday. Acute infarction affecting the right cerebellum with swelling. Mass-effect upon the fourth ventricle but no change in size the lateral or third ventricles at this time. No hemorrhage. 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 Electronically Signed   By: Paulina Fusi M.D.   On: 10/01/2017 07:45   MRI/MRA Brain Wo Contrast Result Date: 10/01/2017 IMPRESSION: 1. Acute/subacute inferior right cerebellum and vermis infarct with associated hemorrhage. 2. Additional scattered punctate foci of acute nonhemorrhagic infarct  involving the right PCA territory with 1 lesion involving the right MCA territory along the right middle frontal gyrus. 3. Multiple foci of remote encephalomalacia involving the inferior posterior right temporal lobe, lateral  right temporal lobe, and high right parietal lobe. 4. 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 The right A1 and likely M1 segments fill via a patent anterior communicating artery. 5. Fenestration of the vertebrobasilar junction without significant basilar artery stenosis. 6. Marked attenuation of PCA branch vessels, worse on the right. Moderate proximal right PCA stenosis. 7. Fetal type left posterior cerebral artery.   Echocardiogram:  Study Conclusions - Left ventricle: The cavity size was normal. Wall thickness was   increased in a pattern of mild LVH. Systolic function was normal.   The estimated ejection fraction was in the range of 60% to 65%.   Wall motion was normal; there were no regional wall motion   abnormalities. Doppler parameters are consistent with abnormal   left ventricular relaxation (grade 1 diastolic dysfunction). - Mitral valve: There was mild regurgitation.  B/L Carotid U/S:                                                Right ECA and ICA waveforms are very similar. Unable to interrogate stenosis.  40-59% left ICA stenosis. Bilateral vertebral artery flow is antegrade.   CT Head:         10/02/17                                                   1. No significant change in the appearance of acute to subacute, hemorrhagic right inferior cerebellar infarct with slight positive mass effect on the adjacent fourth ventricle. No dilatation the third or lateral ventricles. 2. Chronic right parietal, posteromedial temporal and occipital lobe infarcts as well as right periventricular white matter tract lacunar infarct.   EEG:   This normal EEG is recorded in the waking state                                                                  _____________________________________________________________________ ASSESSMENT: Ashlee Snow is a 62 y.o. female with PMH of prior hypertension, coronary artery disease, hypothyroidism, chronic pain, depression, and peripheral arterial disease status post aorto-iliac bypass in June.   Acute, large Right cerebellar stroke with hemorrhage and mass effect Scattered punctate foci of acute nonhemorrhagic infarct -Right PCA territory Multiple foci of remote encephalomalacia - inferior posterior right temporal lobe, lateral right temporal lobe, and high right parietal lobe High-grade stenosis or occlusion of the cavernous right internal carotid artery.  Moderate proximal right PCA stenosis  Suspected Etiology:  Likely large vessel disease with high-grade terminal right ICA stenosis/occlusion and intracranial atherosclerosis  Resultant Symptoms:  Stroke Risk Factors: hyperlipidemia, hypertension and CAD, PVD Other Stroke Risk Factors: Advanced age, Cigarette smoker, Migraines     PLAN  10/06/2017: DC 3% Na 162-  today HOLD ASA for now, due to thrombocytopenia and risk of hemorrhagic conversion. TEE pending requested by ID for endocarditis Neurosurgery consulted - no need for decomp surgery at this time. Appreciate recs Frequent neuro checks Telemetry monitoring Ongoing aggressive  stroke risk factor management Follow up with [facility] in 6 weeks  Encephalopathy Likely multifactorial    INTRACRANIAL Atherosclerosis &Stenosis: DAPT to be considered at discharge once patient improves  DYSPHAGIA: NPO until passes SLP swallow evaluation  MULTIPLE MEDICAL ISSUES Management per CCM team  HYPERTENSION: Stable, some elevated B/P's noted overnight Permissive hypertension (OK if <220/120) for 24-48 hours post stroke and then gradually normalized within 5-7 days. Nicardipine drip, Labetolol PRN-Avoid SBP less than 130 Long term BP goal  normotensive. May slowly restart home B/P medications after 48 hours Home Meds: Metoprolol, Lisinopril  HYPERLIPIDEMIA:    Component Value Date/Time   CHOL 147 10/01/2017 0420   TRIG 208 (H) 10/06/2017 1643   HDL 34 (L) 10/01/2017 0420   CHOLHDL 4.3 10/01/2017 0420   VLDL 67 (H) 10/01/2017 0420   LDLCALC 46 10/01/2017 0420  Home Meds:  Pravachol 20 mg LDL  goal < 70 HOLD Pravachol 20 mg daily for now, until LFT's improve Continue statin at discharge, if LFT's improve  DIABETES: Lab Results  Component Value Date   HGBA1C 6.3 (H) 10/01/2017  HgbA1c goal < 7.0 Currently JX:[id number] Continue CBG monitoring and SSI DM education when applicable   TOBACCO ABUSE Current smoker Smoking cessation counseling when applicable Nicotine patch PRN  Other Active Problems: Principal Problem:   Sepsis (HCC) Active Problems:   Stroke (cerebrum) (HCC)   AKI (acute kidney injury) (HCC)   Obstructive pyelonephritis   Thrombocytopenia (HCC)   Hypokalemia   Lactic acidosis   Encephalopathy acute   MRSA bacteremia   Aspiration into airway  Hospital day # 6  VTE prophylaxis: SCD's  Diet : Diet NPO time specified   Prior Home Stroke Medications: aspirin 81 mg daily and Pravachol 20 mg  Discharge Stroke Meds: Please discharge patient on TBD   Disposition: 01-Home or Self Care Therapy Recs:  CIR Follow Recs:  Follow-up Information    Micki Riley, MD. Schedule an appointment as soon as possible for a visit in 6 week(s).   Specialties:  Neurology, Radiology Contact information: [address] [place] [address] [phone]          Kirstie Peri, MD- PCP follow up in 1-2 weeks after discharge  FAMILY UPDATES: Husband at bedside  TEAM UPDATES: Coralyn Helling, MD    I have personally examined this patient, reviewed notes, independently viewed imaging studies, participated in medical decision making and plan of care.ROS completed by me personally and pertinent  positives fully documented  I have made any additions or clarifications directly to the above note.  . Patient has presented with multiple posterior circulation time right hemispheric infarcts likely due to significant intracranial and extracranial atherosclerosis. She also has significant history of peripheral vascular disease and has aortic stent grafting with possible infection. Cardiology refused to do transesophageal echocardiogram for endocarditis   Long discussion with her husband and Dr. Wallace Cullens at the bedside and answered questions. This patient is critically ill and at significant risk of neurological worsening, death and care requires constant monitoring of vital signs, hemodynamics,respiratory and cardiac monitoring, extensive review of multiple databases, frequent neurological assessment, discussion with family, other specialists and medical decision making of high complexity.I have made any additions or clarifications directly to the above note.This critical care time does not reflect procedure time, or teaching time or supervisory time of PA/NP/Med Resident etc but could involve care discussion time.  I spent 30 minutes of neurocritical care time  in the care of  this patient.     Ashlee HeadyPramod Sethi, MD Medical Director [facility] Pager: [phone] 10/06/2017 6:36 PM  To contact Stroke Continuity provider, please refer to [email or web address]. After hours, contact General Neurology

## 2017-10-07 DIAGNOSIS — I63411 Cerebral infarction due to embolism of right middle cerebral artery: Secondary | ICD-10-CM

## 2017-10-07 DIAGNOSIS — R509 Fever, unspecified: Secondary | ICD-10-CM

## 2017-10-07 DIAGNOSIS — I63111 Cerebral infarction due to embolism of right vertebral artery: Secondary | ICD-10-CM

## 2017-10-07 LAB — BASIC METABOLIC PANEL
ANION GAP: 24 — AB (ref 5–15)
BUN: 18 mg/dL (ref 6–20)
CHLORIDE: 115 mmol/L — AB (ref 101–111)
CO2: 16 mmol/L — ABNORMAL LOW (ref 22–32)
Calcium: 6.7 mg/dL — ABNORMAL LOW (ref 8.9–10.3)
Creatinine, Ser: 1.73 mg/dL — ABNORMAL HIGH (ref 0.44–1.00)
GFR calc non Af Amer: 31 mL/min — ABNORMAL LOW (ref >60)
GFR, EST AFRICAN AMERICAN: 36 mL/min — AB (ref >60)
Glucose, Bld: 233 mg/dL — ABNORMAL HIGH (ref 65–99)
POTASSIUM: 3.1 mmol/L — AB (ref 3.5–5.1)
SODIUM: 155 mmol/L — AB (ref 135–145)

## 2017-10-07 LAB — GLUCOSE, CAPILLARY
GLUCOSE-CAPILLARY: 174 mg/dL — AB (ref 65–99)
GLUCOSE-CAPILLARY: 177 mg/dL — AB (ref 65–99)
GLUCOSE-CAPILLARY: 185 mg/dL — AB (ref 65–99)
GLUCOSE-CAPILLARY: 195 mg/dL — AB (ref 65–99)
GLUCOSE-CAPILLARY: 203 mg/dL — AB (ref 65–99)
Glucose-Capillary: 116 mg/dL — ABNORMAL HIGH (ref 65–99)

## 2017-10-07 LAB — CBC
HEMATOCRIT: 28.6 % — AB (ref 36.0–46.0)
Hemoglobin: 9 g/dL — ABNORMAL LOW (ref 12.0–15.0)
MCH: 29.3 pg (ref 26.0–34.0)
MCHC: 31.5 g/dL (ref 30.0–36.0)
MCV: 93.2 fL (ref 78.0–100.0)
Platelets: 131 10*3/uL — ABNORMAL LOW (ref 150–400)
RBC: 3.07 MIL/uL — AB (ref 3.87–5.11)
RDW: 17.9 % — ABNORMAL HIGH (ref 11.5–15.5)
WBC: 9.7 10*3/uL (ref 4.0–10.5)

## 2017-10-07 LAB — URINE CULTURE
CULTURE: NO GROWTH
SPECIAL REQUESTS: NORMAL

## 2017-10-07 LAB — PROCALCITONIN: Procalcitonin: 0.52 ng/mL

## 2017-10-07 MED ORDER — CHLORHEXIDINE GLUCONATE 0.12% ORAL RINSE (MEDLINE KIT)
15.0000 mL | Freq: Two times a day (BID) | OROMUCOSAL | Status: DC
  Administered 2017-10-07 – 2017-10-15 (×17): 15 mL via OROMUCOSAL

## 2017-10-07 MED ORDER — ORAL CARE MOUTH RINSE
15.0000 mL | OROMUCOSAL | Status: DC
  Administered 2017-10-07 – 2017-10-11 (×42): 15 mL via OROMUCOSAL

## 2017-10-07 MED ORDER — VITAL AF 1.2 CAL PO LIQD
1000.0000 mL | ORAL | Status: DC
  Administered 2017-10-07 – 2017-10-18 (×13): 1000 mL
  Filled 2017-10-07 (×10): qty 1000

## 2017-10-07 MED ORDER — INSULIN ASPART 100 UNIT/ML ~~LOC~~ SOLN
0.0000 [IU] | SUBCUTANEOUS | Status: DC
  Administered 2017-10-07: 5 [IU] via SUBCUTANEOUS
  Administered 2017-10-08: 3 [IU] via SUBCUTANEOUS
  Administered 2017-10-08 (×4): 5 [IU] via SUBCUTANEOUS
  Administered 2017-10-09: 3 [IU] via SUBCUTANEOUS
  Administered 2017-10-09 (×2): 2 [IU] via SUBCUTANEOUS
  Administered 2017-10-09: 5 [IU] via SUBCUTANEOUS
  Administered 2017-10-09 – 2017-10-10 (×2): 2 [IU] via SUBCUTANEOUS
  Administered 2017-10-10: 3 [IU] via SUBCUTANEOUS
  Administered 2017-10-10 (×2): 2 [IU] via SUBCUTANEOUS
  Administered 2017-10-10: 11 [IU] via SUBCUTANEOUS
  Administered 2017-10-10 – 2017-10-11 (×2): 3 [IU] via SUBCUTANEOUS
  Administered 2017-10-11: 2 [IU] via SUBCUTANEOUS
  Administered 2017-10-11 – 2017-10-12 (×7): 3 [IU] via SUBCUTANEOUS
  Administered 2017-10-12: 5 [IU] via SUBCUTANEOUS
  Administered 2017-10-12 – 2017-10-13 (×7): 3 [IU] via SUBCUTANEOUS
  Administered 2017-10-13: 5 [IU] via SUBCUTANEOUS
  Administered 2017-10-13: 3 [IU] via SUBCUTANEOUS
  Administered 2017-10-14: 2 [IU] via SUBCUTANEOUS
  Administered 2017-10-14: 3 [IU] via SUBCUTANEOUS
  Administered 2017-10-14 (×2): 2 [IU] via SUBCUTANEOUS
  Administered 2017-10-14: 3 [IU] via SUBCUTANEOUS
  Administered 2017-10-15: 5 [IU] via SUBCUTANEOUS
  Administered 2017-10-15 (×4): 3 [IU] via SUBCUTANEOUS
  Administered 2017-10-15: 2 [IU] via SUBCUTANEOUS
  Administered 2017-10-16 – 2017-10-18 (×13): 3 [IU] via SUBCUTANEOUS
  Administered 2017-10-18: 2 [IU] via SUBCUTANEOUS
  Administered 2017-10-18 – 2017-10-19 (×6): 3 [IU] via SUBCUTANEOUS
  Administered 2017-10-19: 2 [IU] via SUBCUTANEOUS
  Administered 2017-10-19 (×3): 3 [IU] via SUBCUTANEOUS
  Administered 2017-10-20: 2 [IU] via SUBCUTANEOUS
  Administered 2017-10-20: 3 [IU] via SUBCUTANEOUS
  Administered 2017-10-20: 2 [IU] via SUBCUTANEOUS
  Administered 2017-10-20 (×2): 3 [IU] via SUBCUTANEOUS
  Administered 2017-10-21: 2 [IU] via SUBCUTANEOUS
  Administered 2017-10-21: 3 [IU] via SUBCUTANEOUS
  Administered 2017-10-21 – 2017-10-22 (×4): 2 [IU] via SUBCUTANEOUS

## 2017-10-07 MED ORDER — FLORANEX PO PACK
1.0000 g | PACK | Freq: Every day | ORAL | Status: DC
  Administered 2017-10-07 – 2017-10-14 (×8): 1 g via ORAL
  Filled 2017-10-07 (×9): qty 1

## 2017-10-07 MED ORDER — HEPARIN SODIUM (PORCINE) 5000 UNIT/ML IJ SOLN
5000.0000 [IU] | Freq: Three times a day (TID) | INTRAMUSCULAR | Status: DC
  Administered 2017-10-07 – 2017-10-11 (×12): 5000 [IU] via SUBCUTANEOUS
  Filled 2017-10-07 (×12): qty 1

## 2017-10-07 MED ORDER — POTASSIUM CHLORIDE 20 MEQ/15ML (10%) PO SOLN
40.0000 meq | ORAL | Status: AC
  Administered 2017-10-07 (×2): 40 meq
  Filled 2017-10-07 (×2): qty 30

## 2017-10-07 NOTE — Progress Notes (Signed)
eLink Physician-Brief Progress Note Patient Name: Ashlee ParaDebra A Roots DOB: January 11, 1956 MRN: [id number]   Date of Service  10/07/2017  HPI/Events of Note  Hypokalemia  eICU Interventions  Potassium replaced     Intervention Category Intermediate Interventions: Electrolyte abnormality - evaluation and management  [name] 10/07/2017, 6:53 AM

## 2017-10-07 NOTE — Progress Notes (Signed)
CSW attempted to meet with patient and husband to discuss needs. Patient is currently intubated and asleep, her husband was not present at bedside. CSW placed call to Henreitta Leberimothy Oleson, patient's husband to discuss desire for POA. Mr. Kennedy BuckerGrant stated that he has briefly spoken to a lawyer that informed him he would need documentation proving patient's medical state and asked CSW "if this was true?" Mr. Kennedy BuckerGrant stated that he needed to obtain POA so that he could file taxes jointly. Mr. Kennedy BuckerGrant also stated that this patient is the legal guardian of their granddaughter. CSW informed Mr. Kennedy BuckerGrant that POA's were a legal process and CSW could not provide legal advice, but CSW could contact pastoral care to assist further with this process, Mr. Kennedy BuckerGrant in agreement.  CSW signing off, no further needs at this time.  Edwin Dadaarol Caley Volkert, MSW, LCSW-A Weekend Clinical Social Worker [phone]

## 2017-10-07 NOTE — Progress Notes (Signed)
Nutrition Follow-up  DOCUMENTATION CODES:  Not applicable  INTERVENTION:  Will switch to semi-elemental formula given concern over tolerance.   Vital Af 1.2 @ 60 cc/hr (1440 ml/day) via NGT to provide 1728 kcals (+203 from Diprivan), 108 gm protein, 1168 ml free water daily.  NUTRITION DIAGNOSIS:  Inadequate oral intake related to inability to eat as evidenced by NPO status.  -ongoing  GOAL:  Patient will meet greater than or equal to 90% of their needs   -not met in past 24-36 hrs  MONITOR:  Diet advancement, I & O's  REASON FOR ASSESSMENT:  Ventilator  ASSESSMENT:  Pt with PMH of fibromyalgia, HTN, hypothyroidism who was recent having N/V and treated for urosepsis. 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   Interval Hx: Intubated 12/14. She is severely hypernatremic. She is septic w/ increased fever, Tmax 103.1. Per MD, concern for endocarditis.  Head CT 12/14: New large R MCA infarct w/ Cytotoxic Edema R hemisphere   Pt seen due to being intubated midday on 12/14. Also on 12/14, her tube feeding had been decreased, from what can tell, due to concerns the tube feeding was causing loose stools.   Spoke with RN regarding this, she stated the patient was having diarrhea prior to the initiation of the tube feeding. Noted patient is on several ABx.   Abdomen is mildly firm.   Given patients heightened requirements given fever and worsening respiratory status, will change formula to semi-elemental to improve tolerance and to also increase protein. Monitor Stools.   Patient is currently intubated on ventilator support MV: 13.5 L/min Temp (24hrs), Avg:100.2 F (37.9 C), Min:98.3 F (36.8 C), Max:103.1 F (39.5 C)  Propofol: 7.7 ml/hr = 203 kcals/day  Meds: Prostat, Insulin, PPI, Thiamine, Propofol, IV abx, IVF Labs: Na: 155, K: 3.1, Glu:233, BUN/Creat:18/1.73  Recent Labs  Lab 10/04/17 1520 10/12/2017 0523 10/21/2017 1709 10/06/17 0621 10/06/17 2020  10/07/17 0500  NA  --  168*  --  157* 158* 155*  K  --  3.2*  --  5.2* 3.1* 3.1*  CL  --  >130*  --  >130* >130* 115*  CO2  --  19*  --  18* 19* 16*  BUN  --  34*  --  23* 20 18  CREATININE  --  2.01*  --  1.43* 1.36* 1.73*  CALCIUM  --  7.9*  --  7.0* 7.5* 6.7*  MG 2.2 2.2 2.2 2.1 2.1  --   PHOS 3.5 4.0 2.8  --   --   --   GLUCOSE  --  128*  --  563* 118* 233*   Diet Order:  Diet NPO time specified  EDUCATION NEEDS:  No education needs have been identified at this time  Skin:  Skin Assessment: Reviewed RN Assessment  Last BM:  12/8  Height:  Ht Readings from Last 1 Encounters:  10/06/17 5' 4" (1.626 m)   Weight:  Wt Readings from Last 1 Encounters:  10/07/17 149 lb 4 oz (67.7 kg)   Wt Readings from Last 10 Encounters:  10/07/17 149 lb 4 oz (67.7 kg)  09/11/17 141 lb (64 kg)  08/21/17 137 lb (62.1 kg)  05/17/17 141 lb (64 kg)  05/03/17 141 lb (64 kg)  04/24/17 144 lb (65.3 kg)  04/18/17 147 lb 14.9 oz (67.1 kg)  04/13/17 144 lb (65.3 kg)  04/12/17 144 lb (65.3 kg)  04/10/17 144 lb (65.3 kg)  Dry weight/dosing weight 139 lbs 12 oz.  (  63.52 kg)  Ideal Body Weight:  54.5 kg  BMI:  Body mass index using dosing weight is 24.0 kg/m.  Estimated Nutritional Needs:  Kcal:  1955 kcals ([facility] 2003 B) Protein:  95-108g Pro (1.5-1.7g/kg bw) Fluid:  >2 Liters  Nathan Franks RD, LDN, CNSC Clinical Nutrition Pager: [phone] 10/07/2017 2:34 PM  

## 2017-10-07 NOTE — Progress Notes (Addendum)
CRITICAL CARE PROGRESS NOTE    Mechanical Ventilation day # 2 post stroke day 7  Name: Ashlee Snow MRN:   [id number] DOB:   [birth year]         ADMISSION DATE:  10/18/2017 CHIEF COMPLAINT: Fever and nausea HISTORY OF PRESENT ILLNESS:   This is a 61 year old with a past medical history of fibromyalgia, hypertension, and hypothyroidism who for several days prior to admission had apparently been suffering from nausea and vomiting.  She subsequently had fevers and was evaluated at an outside hospital where she was found to have left hydronephrosis.  A ureteral stent was placed and she was treated for urosepsis with antibiotics and generous fluids.  She has grown MSSA from her blood.   [facility]   urine sample&  culture were contaminated, so we have no culture of her urine on presentation..  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   Her oxygen requirements increased substantially on 12/10 and Levaquin  was added to nafcillin to cover for presumed aspiration.  Zosyn was subsequently substituted for both. Her oxygen requirements have improved, but her mental status has not.  Today she is febrile.  She is having liquid stool.  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  PAST SURGICAL HISTORY: She  has a past surgical history that includes Cholecystectomy (01/22/1997); Abdominal hysterectomy; Ovarian cyst surgery; aortogram w/ stenting (2007, 2011, Aug. 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    Current Facility-Administered Medications:  .   stroke: mapping our early stages of recovery book, , Does not apply, Once, Opyd, Ilene Qua, MD .  acetaminophen (TYLENOL) tablet 650 mg, 650 mg, Oral, Q4H PRN **OR** acetaminophen (TYLENOL) solution 650 mg, 650 mg, Per Tube, Q4H PRN, 650 mg at 10/07/17 1307 **OR** acetaminophen (TYLENOL) suppository 650 mg, 650 mg, Rectal, Q4H PRN, Opyd, Ilene Qua, MD, 650 mg at 10/01/17 1705 .  albuterol (PROVENTIL) (2.5 MG/3ML) 0.083% nebulizer solution 2.5 mg, 2.5 mg, Nebulization, Q6H PRN, Sampson Goon, MD, 2.5 mg at 10/03/17 1950 .  amLODipine (NORVASC) tablet 10 mg, 10 mg, Per Tube, Daily, Sampson Goon, MD, 10 mg at 10/07/17 2202 .  aspirin tablet 325 mg, 325 mg, Oral, Daily, Garvin Fila, MD, 325 mg at 10/07/17 5427 .  chlorhexidine gluconate (MEDLINE KIT) (PERIDEX) 0.12 % solution 15 mL, 15 mL, Mouth Rinse, BID, Rosalin Hawking, MD .  Chlorhexidine Gluconate Cloth 2 % PADS 6 each, 6 each, Topical, Daily, Cherene Altes, MD, 6 each at 10/07/17 1500 .  dextrose 5 % and 0.45 % NaCl with KCl 20 mEq/L infusion, , Intravenous, Continuous, Sampson Goon, MD, Last Rate: 80 mL/hr at 10/07/17 1700 .  feeding supplement (VITAL AF 1.2 CAL) liquid 1,000 mL, 1,000 mL, Per Tube, Continuous, Sood, Vineet, MD, Last Rate: 60 mL/hr at 10/07/17 1727, 1,000 mL at 10/07/17 1727 .  free water 200 mL, 200 mL, Per Tube, Q8H, Deterding, Guadelupe Sabin, MD, 200 mL at 10/07/17 1500 .  heparin injection 5,000 Units, 5,000 Units, Subcutaneous, Q8H, Rosalin Hawking, MD, 5,000 Units at 10/07/17  1418 .  insulin aspart (novoLOG) injection 0-9 Units, 0-9 Units, Subcutaneous, Q4H, Opyd, Ilene Qua, MD, 2 Units at 10/07/17 1622 .  ipratropium-albuterol (DUONEB) 0.5-2.5 (3) MG/3ML nebulizer solution 3 mL, 3 mL, Nebulization, BID, Raylene Miyamoto, MD, 3 mL at 10/07/17 0744 .  labetalol  (NORMODYNE,TRANDATE) injection 5 mg, 5 mg, Intravenous, Q2H PRN, Sampson Goon, MD, 5 mg at 10/04/17 0920 .  lactobacillus (FLORANEX/LACTINEX) granules 1 g, 1 g, Oral, Daily, Sivakumar, Siva P, MD .  levothyroxine (SYNTHROID, LEVOTHROID) injection 50 mcg, 50 mcg, Intravenous, Daily, Sampson Goon, MD, 50 mcg at 10/07/17 0913 .  MEDLINE mouth rinse, 15 mL, Mouth Rinse, 10 times per day, Rosalin Hawking, MD, 15 mL at 10/07/17 1611 .  morphine 4 MG/ML injection 1 mg, 1 mg, Intravenous, Q3H PRN, Sampson Goon, MD, 1 mg at 10/06/17 [phone] .  pantoprazole (PROTONIX) injection 40 mg, 40 mg, Intravenous, Q24H, Deterding, Guadelupe Sabin, MD, 40 mg at 10/07/17 0423 .  piperacillin-tazobactam (ZOSYN) IVPB 3.375 g, 3.375 g, Intravenous, Q8H, Masters, Jake Church, RPH, Stopped at 10/07/17 1552 .  propofol (DIPRIVAN) 1000 MG/100ML infusion, 5-80 mcg/kg/min, Intravenous, Titrated, Sampson Goon, MD, Last Rate: 7.7 mL/hr at 10/07/17 1700, 20.052 mcg/kg/min at 10/07/17 1700 .  senna-docusate (Senokot-S) tablet 1 tablet, 1 tablet, Oral, QHS PRN, Opyd, [name] S, MD .  sodium chloride flush (NS) 0.9 % injection 10-40 mL, 10-40 mL, Intracatheter, Q12H, Cherene Altes, MD, 20 mL at 10/07/17 1000 .  sodium chloride flush (NS) 0.9 % injection 10-40 mL, 10-40 mL, Intracatheter, PRN, Cherene Altes, MD .  thiamine (B-1) injection 100 mg, 100 mg, Intravenous, Daily, Sampson Goon, MD, 100 mg at 10/07/17 1191 .  vancomycin (VANCOCIN) IVPB 1000 mg/200 mL premix, 1,000 mg, Intravenous, Q24H, Masters, Jake Church, RPH, Stopped at 10/07/17 1518     New Events  Febrile Diarrhea persists Awaiting TEE   PHYSICAL EXAM:   Sedated, comfortable on vent Blood pressure (!) 145/74, pulse 92, temperature 99.9 F (37.7 C), temperature source Axillary, resp. rate (!) 27, height _0  (1.626 m), weight 149 lb 4 oz (67.7 kg), SpO2 100 %. Eyes: Pupils equal,No pallor Neck: No neck swelling CVS:  -s1 s2 regular Resp:Breath sounds equal;  no rales Abdomen:abd-soft, non distended,    BS+ Extremities:No edema Neuro:sedated,tone  Normal Skin: no rash Integumentary: No clubbing     Ventilator  Fi02- 40%, Peep- 5, Rate- 20, Vt- 470m Pip- 20s  No AutoPEEP    LABS:  Results for GMARKESHA, HANNIG(MRN [id number] as of 10/07/2017 17:28  Ref. Range 10/06/2017 15:20  pH, Arterial Latest Ref Range: 7.350 - 7.450  7.360  pCO2 arterial Latest Ref Range: 32.0 - 48.0 mmHg 32.3  pO2, Arterial Latest Ref Range: 83.0 - 108.0 mmHg 371.0 (H)  TCO2 Latest Ref Range: 22 - 32 mmol/L 19 (L)   Results for GREITA, SHINDLER(MRN [id number] as of 10/07/2017 17:28  Ref. Range 10/07/2017 05:00  Sodium Latest Ref Range: 135 - 145 mmol/L 155 (H)  Potassium Latest Ref Range: 3.5 - 5.1 mmol/L 3.1 (L)  Chloride Latest Ref Range: 101 - 111 mmol/L 115 (H)  CO2 Latest Ref Range: 22 - 32 mmol/L 16 (L)  Glucose Latest Ref Range: 65 - 99 mg/dL 233 (H)  BUN Latest Ref Range: 6 - 20 mg/dL 18  Creatinine Latest Ref Range: 0.44 - 1.00 mg/dL 1.73 (H)  Calcium Latest Ref Range: 8.9 - 10.3 mg/dL 6.7 (L)  Anion gap Latest Ref  Range: 5 - 15  24 (H)    Results for QUANTISHA, MARSICANO (MRN [id number]) as of 10/07/2017 17:28  Ref. Range 10/07/2017 05:00  WBC Latest Ref Range: 4.0 - 10.5 K/uL 9.7  RBC Latest Ref Range: 3.87 - 5.11 MIL/uL 3.07 (L)  Hemoglobin Latest Ref Range: 12.0 - 15.0 g/dL 9.0 (L)  HCT Latest Ref Range: 36.0 - 46.0 % 28.6 (L)  MCV Latest Ref Range: 78.0 - 100.0 fL 93.2  MCH Latest Ref Range: 26.0 - 34.0 pg 29.3  MCHC Latest Ref Range: 30.0 - 36.0 g/dL 31.5  RDW Latest Ref Range: 11.5 - 15.5 % 17.9 (H)  Platelets Latest Ref Range: 150 - 400 K/uL 131 (L)    CHEST X-RAY:  12/14 RUE PICC, ETT, G tube- good position    left base atelectasis/ infiltrate   Ekg-12.9.18 sinus- no acute changes     12/9 echo - Left ventricle: The cavity size was normal. Wall thickness was   increased in a pattern of mild LVH. Systolic function was normal.   The  estimated ejection fraction was in the range of 60% to 65%.   Wall motion was normal; there were no regional wall motion   abnormalities. Doppler parameters are consistent with abnormal   left ventricular relaxation (grade 1 diastolic dysfunction). - Mitral valve: There was mild regurgitation.   Ct brain 12/14  1. New, large area of cytotoxic edema within the posterior right hemisphere, involving the posterior parietal and temporal lobes and the occipital lobe. This includes portions of the posterior right MCA territory and the PCA territory. 2. Unchanged cytotoxic edema of the right cerebellar hemisphere with mass effect on the fourth ventricle. 3. No herniation, hydrocephalus or hemorrhage.   12/9 MRI/MRA brain 1. Acute/subacute inferior right cerebellum and vermis infarct with associated hemorrhage. 2. Additional scattered punctate foci of acute nonhemorrhagic infarct involving the right PCA territory with 1 lesion involving the right MCA territory along the right middle frontal gyrus. 3. Multiple foci of remote encephalomalacia involving the inferior posterior right temporal lobe, lateral right temporal lobe, and high right parietal lobe. 4. 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 The right A1 and likely M1 segments fill via a patent anterior communicating artery. 5. Fenestration of the vertebrobasilar junction without significant basilar artery stenosis. 6. Marked attenuation of PCA branch vessels, worse on the right. Moderate proximal right PCA stenosis. 7. Fetal type left posterior cerebral artery.Assessment- Plan:     Assessment-Plan:  Acute, large Right cerebellar stroke with hemorrhage and mass effect;  acute nonhemorrhagic infarct -Right PCA territory Multiple foci of remote encephalomalacia - inferior posterior right temporal lobe, lateral right temporal lobe, and high right parietal lobe; High-grade stenosis or occlusion  of the cavernous right internal carotid artery.   Moderate proximal right PCA stenosis Suspected Etiology:  Likely large vessel disease with high-grade terminal right ICA stenosis/occlusion and intracranial atherosclerosis  Stroke Risk Factors: hyperlipidemia, hypertension,CAD, PVD Other Stroke Risk Factors: Advanced age, Cigarette smoker, Migraines    Stroke (cerebrum)    AKI (acute kidney injury)    Obstructive pyelonephritis/Hydronephrosis (R)   Thrombocytopenia    Hypokalemia Hypernatremia   acute Encephalopathy   MSSA bacteremia   Aspiration   Acute Respiratory Failure secondary to aspiration and CVA.     Neurology Airway protected by ETT Monitor HD, glucose, Na On ASA now May consider DAPT on discharge if pt condition improves Permissive hypertension (OK if <220/120) for 24-48 hours post stroke and then  gradually normalized within 5-7 days. Long term BP goal 130-150 due to right ICA occlusion    Cardiology  BP management as above TEE planned   Pulmonary  Not a candidate for SBT at this time until sepsis and Neuro status improve F/u CXR, ABG  GI /nutrition  Tube feeds Lactobacillus for diarrhea CDT negative  Renal, fluids, electrolytes Monitor lytes  On hypotonic fluids    Hematology Monitor CBC On ASA,  Endocrinology  On synthroid  Infectious diseases On vanco +zosyn  Lines -Devices 12/14 rectal tube,ETT, 12/12  Gastric tube 12/9 PICC  12/8 Foley      Husband counseled this am at bedside     Goals of Care:   Full code     Thank you for letting me participate in the care of your patient.  ^^^^^^^^^^ I  Have personally spent    70  Minutes  In the care of this Patient providing Critical care Services; Time includes review of chart, labs, imaging, coordinating care with other physicians and healthcare team members. Also includes time for frequent reevaluation and additional treatment implementation due to change in clinical condiiton of  patient. Excludes time spent for Procedure and Teaching.   ^^^^^^^^^^  Note subject to typographical and grammatical errors;   Any formal questions or concerns about the content, text, or information contained within the body of this dictation should be directly addressed to the physician  for  clarification.   Evans Lance, MD Pulmonary and [place] Pager: [phone] CRITICAL CARE PROGRESS NOTE

## 2017-10-07 NOTE — Progress Notes (Signed)
STROKE TEAM PROGRESS NOTE   SUBJECTIVE (INTERVAL HISTORY) Pt husband at bedside. Her respiratory condition declined and was intubated yesterday for airway protection. Repeat CT head showed new right MCA large infarct which was not there 5 days ago. Concerning for endocarditis. TEE not able to do yesterday due to poor medical condition. Today still intubated, on propofol and Abx.    OBJECTIVE Lab Results: CBC:  Recent Labs  Lab 10/04/17 0500 10/06/17 0621 10/07/17 0500  WBC 8.5 8.7 9.7  HGB 12.2 9.5* 9.0*  HCT 36.8 29.8* 28.6*  MCV 89.1 92.3 93.2  PLT 128* 135* 131*   BMP: Recent Labs  Lab 10/04/17 0500 10/04/17 1520 December 13, 2016 0523 December 13, 2016 1709 10/06/17 0621 10/06/17 2020 10/07/17 0500  NA 165*  --  168*  --  157* 158* 155*  K 2.7*  --  3.2*  --  5.2* 3.1* 3.1*  CL >130*  --  >130*  --  >130* >130* 115*  CO2 19*  --  19*  --  18* 19* 16*  GLUCOSE 124*  --  128*  --  563* 118* 233*  BUN 27*  --  34*  --  23* 20 18  CREATININE 1.64*  --  2.01*  --  1.43* 1.36* 1.73*  CALCIUM 8.3*  --  7.9*  --  7.0* 7.5* 6.7*  MG 2.1 2.2 2.2 2.2 2.1 2.1  --   PHOS  --  3.5 4.0 2.8  --   --   --    Liver Function Tests:  Recent Labs  Lab 10/01/17 0333  AST 183*  ALT 72*  ALKPHOS 50  BILITOT 0.6  PROT 6.1*  ALBUMIN 2.7*   Recent Labs  Lab 10/01/17 0317  AMMONIA 36*   Cardiac Enzymes:  Recent Labs  Lab 10/01/17 0317 10/01/17 0420 10/01/17 1730  TROPONINI 1.04* 1.07* 0.58*   Coagulation Studies:  No results for input(s): APTT, INR in the last 72 hours. PHYSICAL EXAM Temp:  [98.3 F (36.8 C)-103.1 F (39.5 C)] 100.4 F (38 C) (12/15 0600) Pulse Rate:  [87-106] 90 (12/15 0830) Resp:  [20-32] 29 (12/15 0830) BP: (126-166)/(57-114) 144/84 (12/15 0830) SpO2:  [94 %-100 %] 100 % (12/15 0830) FiO2 (%):  [40 %-100 %] 40 % (12/15 0932) Weight:  [149 lb 4 oz (67.7 kg)] 149 lb 4 oz (67.7 kg) (12/15 0430)  General - Well nourished, well developed,  intubated.  Ophthalmologic - Fundi not visualized due to noncooperation.  Cardiovascular - Regular rate and rhythm.  Neuro - intubated on sedation, not following commands, eyes briefly open on pain stimulation. PERRL, not blinking to visual threat bilaterally, doll's eyes sluggish, left corneal weak, right corneal brisk, positive gag and cough. On pain stimulation, BLE withdraw against gravity, RUE 3-/5 and 0/5 LUE. DTR 1+ and not cooperative on babinski testing. Sensation, coordination and gait not tested.   IMAGING: I have personally reviewed the radiological images below and agree with the radiology interpretations.  Ct Head Wo Contrast 10/01/2017 IMPRESSION: No significant change since yesterday. Acute infarction affecting the right cerebellum with swelling. Mass-effect upon the fourth ventricle but no change in size the lateral or third ventricles at this time. No hemorrhage. 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 Electronically Signed   By: Paulina FusiMark  Shogry M.D.   On: 10/01/2017 07:45   MRI/MRA Brain Wo Contrast 10/01/2017 IMPRESSION: 1. Acute/subacute inferior right cerebellum and vermis infarct with associated hemorrhage. 2. Additional scattered punctate foci of acute nonhemorrhagic infarct involving the right PCA  territory with 1 lesion involving the right MCA territory along the right middle frontal gyrus. 3. Multiple foci of remote encephalomalacia involving the inferior posterior right temporal lobe, lateral right temporal lobe, and high right parietal lobe. 4. 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 The right A1 and likely M1 segments fill via a patent anterior communicating artery. 5. Fenestration of the vertebrobasilar junction without significant basilar artery stenosis. 6. Marked attenuation of PCA branch vessels, worse on the right. Moderate proximal right PCA stenosis. 7. Fetal type left posterior cerebral artery.    Echocardiogram:  - Left ventricle: The cavity size was normal. Wall thickness was   increased in a pattern of mild LVH. Systolic function was normal.   The estimated ejection fraction was in the range of 60% to 65%.   Wall motion was normal; there were no regional wall motion   abnormalities. Doppler parameters are consistent with abnormal   left ventricular relaxation (grade 1 diastolic dysfunction). - Mitral valve: There was mild regurgitation.  B/L Carotid U/S:                                                Right ECA and ICA waveforms are very similar. Unable to interrogate stenosis.  40-59% left ICA stenosis. Bilateral vertebral artery flow is antegrade.   CT Head: 10/02/17                                                   1. No significant change in the appearance of acute to subacute, hemorrhagic right inferior cerebellar infarct with slight positive mass effect on the adjacent fourth ventricle. No dilatation the third or lateral ventricles. 2. Chronic right parietal, posteromedial temporal and occipital lobe infarcts as well as right periventricular white matter tract lacunar infarct.   EEG 10/02/17:   This normal EEG is recorded in the waking state     Ct Head Wo Contrast 10/06/2017 IMPRESSION: 1. New, large area of cytotoxic edema within the posterior right hemisphere, involving the posterior parietal and temporal lobes and the occipital lobe. This includes portions of the posterior right MCA territory and the PCA territory. 2. Unchanged cytotoxic edema of the right cerebellar hemisphere with mass effect on the fourth ventricle. 3. No herniation, hydrocephalus or hemorrhage.                ASSESSMENT: Ms. Ashlee Snow is a 61 y.o. female with PMH of prior hypertension, coronary artery disease, hypothyroidism, chronic pain, depression, smoker and peripheral arterial disease status post aorto-iliac bypass in June admitted for fever, confusion, right flank pain. Found to have  right pyelonephritis and hydronephrosis. Also MRI showed right cerebellar large infarcts with right MCA scattered infarcts. B Cx showed MSSA. On Abx. However, repeat CT head on 10/06/17 showed new large right MCA infarcts. Concerning for endocarditis.   Acute, large Right cerebellar stroke with petechial hemorrhagic conversion as well as acute large right MCA and PCA infarct - concerning for endocarditis. Pt found to have High-grade stenosis or occlusion of the cavernous right internal carotid artery.    Resultant - intubated on sedation, left hemiparesis  CT head 10/02/17 right cerebellar large infarct  MRI head 10/01/17  right cerebellar large infarct and right MCA scattered small infarcts. Old right PCA, right caudate, right parietal high convexity  MRA head - right ICA cavernous high grade stenosis vs. Occlusion, b/l M1 stenosis, right PCA distal stenosis  CT head 10/06/17 - new large right MCA and PCA infarct with cytotoxic edema  Will consider CTA head and neck once Cre normalized  CUS - waveform consistent with right ICA distal stenosis vs. Occlusion  TTE EF 60-65%, no vegetation  TEE pending  Heparin subq for DVT prophylaxis  NPO for now  No antithrombotics PTA, now on ASA 325mg .  Therapy recommendation - pending  Deposition - pending  Sepsis with pyelonephritis and bacteremia - ? Endocarditis  Tmax 10/06/17 103.1  Admitted for pyelonephritis with hydronephrosis confirmed on CT  B Cx positive for MSSA  On zosyn and vanco  Due to recurrent stroke, concerning for endocarditis  TEE pending  Respiratory failure  Intubated 10/06/17  CCM on board  On sedation  AKI  Cre 1.64->2.01->1.36->1.73  On IVF  CCM on board  Continue to monitor  Intracranial atherosclerosis and stenosis:  MRA showed right ICA distal occlusion, b/l M1 stenosis, right PCA distal stenosis  Will consider CTA head and neck once Cre normalized  CUS consistent with right ICA distal  stenosis or occlusion  On ASA now  May consider DAPT on discharge if pt condition improves  HTN  Stable, some elevated B/P's noted overnight  Permissive hypertension (OK if <220/120) for 24-48 hours post stroke and then gradually normalized within 5-7 days.  Long term BP goal 130-150 due to right ICA occlusion  HYPERLIPIDEMIA  Home Meds:  Pravachol 20 mg  LDL 46,  goal < 70  HOLD Pravachol 20 mg daily for now, until LFT's improve  LFT repeat pending  TOBACCO ABUSE  Current smoker  Smoking cessation counseling when applicable  Nicotine patch PRN  Other Active Problems:  Hyperglycemia - on D5 and 1/2 NS  Hospital day # 7  This patient is critically ill due to recurrent stroke, sepsis, fever, bacteremia, AKI, intubated and at significant risk of neurological worsening, death form recurrent stroke, hemorrhagic conversion, septic shock, seizure, endocarditis, kidney failure. This patient's care requires constant monitoring of vital signs, hemodynamics, respiratory and cardiac monitoring, review of multiple databases, neurological assessment, discussion with family, other specialists and medical decision making of high complexity. I had long discussion with husband at bedside, updated pt current condition, treatment plan and potential prognosis. He expressed understanding and appreciation. I spent 40 minutes of neurocritical care time in the care of this patient.  Marvel Plan, MD PhD Stroke Neurology 10/07/2017 1:07 PM  To contact Stroke Continuity provider, please refer to [email or web address]. After hours, contact General Neurology

## 2017-10-08 ENCOUNTER — Inpatient Hospital Stay (HOSPITAL_COMMUNITY): Payer: BLUE CROSS/BLUE SHIELD

## 2017-10-08 LAB — CBC WITH DIFFERENTIAL/PLATELET
BASOS PCT: 0 %
Basophils Absolute: 0 10*3/uL (ref 0.0–0.1)
EOS ABS: 0.2 10*3/uL (ref 0.0–0.7)
Eosinophils Relative: 2 %
HCT: 28.3 % — ABNORMAL LOW (ref 36.0–46.0)
HEMOGLOBIN: 8.8 g/dL — AB (ref 12.0–15.0)
Lymphocytes Relative: 18 %
Lymphs Abs: 1.8 10*3/uL (ref 0.7–4.0)
MCH: 29 pg (ref 26.0–34.0)
MCHC: 31.1 g/dL (ref 30.0–36.0)
MCV: 93.4 fL (ref 78.0–100.0)
Monocytes Absolute: 0.5 10*3/uL (ref 0.1–1.0)
Monocytes Relative: 5 %
NEUTROS PCT: 75 %
Neutro Abs: 7.4 10*3/uL (ref 1.7–7.7)
Platelets: 145 10*3/uL — ABNORMAL LOW (ref 150–400)
RBC: 3.03 MIL/uL — AB (ref 3.87–5.11)
RDW: 17.8 % — ABNORMAL HIGH (ref 11.5–15.5)
WBC: 9.8 10*3/uL (ref 4.0–10.5)

## 2017-10-08 LAB — BLOOD GAS, ARTERIAL
Acid-base deficit: 8.9 mmol/L — ABNORMAL HIGH (ref 0.0–2.0)
BICARBONATE: 14.8 mmol/L — AB (ref 20.0–28.0)
Drawn by: 252031
FIO2: 40
LHR: 20 {breaths}/min
O2 SAT: 98.2 %
PATIENT TEMPERATURE: 98.6
PCO2 ART: 23.9 mmHg — AB (ref 32.0–48.0)
PEEP: 5 cmH2O
VT: 440 mL
pH, Arterial: 7.41 (ref 7.350–7.450)
pO2, Arterial: 124 mmHg — ABNORMAL HIGH (ref 83.0–108.0)

## 2017-10-08 LAB — COMPREHENSIVE METABOLIC PANEL
ALK PHOS: 64 U/L (ref 38–126)
ALT: 22 U/L (ref 14–54)
AST: 74 U/L — ABNORMAL HIGH (ref 15–41)
Albumin: 2.2 g/dL — ABNORMAL LOW (ref 3.5–5.0)
Anion gap: 9 (ref 5–15)
BUN: 18 mg/dL (ref 6–20)
CALCIUM: 7.7 mg/dL — AB (ref 8.9–10.3)
CO2: 16 mmol/L — ABNORMAL LOW (ref 22–32)
CREATININE: 1.23 mg/dL — AB (ref 0.44–1.00)
Chloride: 124 mmol/L — ABNORMAL HIGH (ref 101–111)
GFR, EST AFRICAN AMERICAN: 55 mL/min — AB (ref >60)
GFR, EST NON AFRICAN AMERICAN: 47 mL/min — AB (ref >60)
Glucose, Bld: 229 mg/dL — ABNORMAL HIGH (ref 65–99)
Potassium: 3.8 mmol/L (ref 3.5–5.1)
Sodium: 149 mmol/L — ABNORMAL HIGH (ref 135–145)
TOTAL PROTEIN: 5.3 g/dL — AB (ref 6.5–8.1)
Total Bilirubin: 0.7 mg/dL (ref 0.3–1.2)

## 2017-10-08 LAB — CULTURE, BLOOD (ROUTINE X 2)
CULTURE: NO GROWTH
CULTURE: NO GROWTH
Special Requests: ADEQUATE
Special Requests: ADEQUATE

## 2017-10-08 LAB — GLUCOSE, CAPILLARY
GLUCOSE-CAPILLARY: 226 mg/dL — AB (ref 65–99)
GLUCOSE-CAPILLARY: 188 mg/dL — AB (ref 65–99)
GLUCOSE-CAPILLARY: 223 mg/dL — AB (ref 65–99)
GLUCOSE-CAPILLARY: 220 mg/dL — AB (ref 65–99)
Glucose-Capillary: 228 mg/dL — ABNORMAL HIGH (ref 65–99)
Glucose-Capillary: 230 mg/dL — ABNORMAL HIGH (ref 65–99)

## 2017-10-08 LAB — T4, FREE: Free T4: 0.5 ng/dL — ABNORMAL LOW (ref 0.61–1.12)

## 2017-10-08 LAB — PROCALCITONIN: Procalcitonin: 0.72 ng/mL

## 2017-10-08 LAB — TSH: TSH: 20.986 u[IU]/mL — ABNORMAL HIGH (ref 0.350–4.500)

## 2017-10-08 MED ORDER — PRAVASTATIN SODIUM 20 MG PO TABS
20.0000 mg | ORAL_TABLET | ORAL | Status: DC
  Administered 2017-10-08 – 2017-10-14 (×7): 20 mg via ORAL
  Filled 2017-10-08 (×7): qty 1

## 2017-10-08 NOTE — Progress Notes (Addendum)
CRITICAL CARE PROGRESS NOTE   Mechanical Ventilation day # 3 post stroke day 8  Name:Ashlee Snow QTM:[id number] DOB:[birth year]  ADMISSION DATE:09/23/2017  CHIEF COMPLAINT:Fever and nausea HISTORY OF PRESENT ILLNESS: This is a 61 year old with a past medical history of fibromyalgia, hypertension, and hypothyroidism who for several days prior to admission had apparently been suffering from nausea and vomiting. She subsequently had fevers and was evaluated at an outside hospital where she was found to have left hydronephrosis. A ureteral stent was placed and she was treated for urosepsis with antibiotics and generous fluids. She has grown MSSA from her blood.  [facility]  urine sample & culture was contaminated,so we have no culture of her urine on presentation..CT scan of the head has shown a cerebellar infarction.  Her oxygen requirements increased substantially on 12/10 and Levaquin was added to nafcillin to cover for presumed aspiration. Zosyn was subsequently substituted for both. Her oxygen requirements have improved, but her mental status has not. Today she is febrile. She is having liquid stool.  PAST MEDICAL HISTORY : [name] a past medical history of Asthma, Bronchitis, DDD (degenerative disc disease), Depression, Dyspnea, Fibromyalgia, GERD (gastroesophageal reflux disease), History of depression, Hypertension, Hypothyroidism, Leg pain, Migraine, Peripheral arterial disease ([place]), Peripheral vascular disease ([place]), Sciatica, Stroke (cerebrum) ([place]) (10/03/2017), Thyroid disease, TMJ (temporomandibular joint disorder) (08/24/1991), and Ulcer.  PAST SURGICAL HISTORY: [name] a past surgical history that includes Cholecystectomy (01/22/1997); Abdominal hysterectomy; Ovarian cyst surgery; aortogram w/ stenting (2007, 2011, Aug. 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   Subjective: Tolerated CPAP trial. Fevers intermittently. Diarrhea persists Awaiting TEE    Current Facility-Administered Medications:  .   stroke: mapping our early stages of recovery book, , Does not apply, Once, Opyd, Ilene Qua, MD .  acetaminophen (TYLENOL) tablet 650 mg, 650 mg, Oral, Q4H PRN **OR** acetaminophen (TYLENOL) solution 650 mg, 650 mg, Per Tube, Q4H PRN, 650 mg at 10/08/17 0128 **OR** acetaminophen (TYLENOL) suppository 650 mg, 650 mg, Rectal, Q4H PRN, Opyd, Ilene Qua, MD, 650 mg at 10/01/17 1705 .  albuterol (PROVENTIL) (2.5 MG/3ML) 0.083% nebulizer solution 2.5 mg, 2.5 mg, Nebulization, Q6H PRN, Sampson Goon, MD, 2.5 mg at 10/03/17 1950 .  amLODipine (NORVASC) tablet 10 mg, 10 mg, Per Tube, Daily, Sampson Goon, MD, 10 mg at 10/08/17 0925 .  aspirin tablet 325 mg, 325 mg, Oral, Daily, Garvin Fila, MD, 325 mg at 10/08/17 0925 .  chlorhexidine gluconate (MEDLINE KIT) (PERIDEX) 0.12 % solution 15 mL, 15 mL, Mouth Rinse, BID, Rosalin Hawking, MD, 15 mL at 10/08/17 0900 .  Chlorhexidine Gluconate Cloth 2 % PADS 6 each, 6 each, Topical, Daily, Cherene Altes, MD, 6 each at 10/08/17 1500 .  dextrose 5 % and 0.45 % NaCl with KCl 20 mEq/L infusion, , Intravenous, Continuous, Sampson Goon, MD, Last Rate: 80 mL/hr at 10/08/17 1500 .  feeding supplement (VITAL AF 1.2 CAL) liquid 1,000 mL, 1,000 mL, Per Tube, Continuous, Sood, Vineet, MD, Last Rate: 60 mL/hr at 10/08/17 1500, 1,000 mL at 10/08/17 1500 .  heparin injection 5,000 Units, 5,000 Units, Subcutaneous, Q8H, Rosalin Hawking, MD, 5,000 Units at 10/08/17 1528 .  insulin aspart (novoLOG) injection 0-15 Units, 0-15 Units, Subcutaneous, Q4H, Deterding, Guadelupe Sabin, MD, 5 Units at 10/08/17 1700 .  ipratropium-albuterol (DUONEB) 0.5-2.5 (3) MG/3ML nebulizer solution 3 mL, 3 mL, Nebulization, BID, Raylene Miyamoto,  MD, 3 mL at  10/08/17 0734 .  labetalol (NORMODYNE,TRANDATE) injection 5 mg, 5 mg, Intravenous, Q2H PRN, Sampson Goon, MD, 5 mg at 10/04/17 0920 .  lactobacillus (FLORANEX/LACTINEX) granules 1 g, 1 g, Oral, Daily, Sunni Richardson P, MD, 1 g at 10/08/17 0925 .  levothyroxine (SYNTHROID, LEVOTHROID) injection 50 mcg, 50 mcg, Intravenous, Daily, Sampson Goon, MD, 50 mcg at 10/08/17 1694 .  MEDLINE mouth rinse, 15 mL, Mouth Rinse, 10 times per day, Rosalin Hawking, MD, 15 mL at 10/08/17 1700 .  morphine 4 MG/ML injection 1 mg, 1 mg, Intravenous, Q3H PRN, Sampson Goon, MD, 1 mg at 10/07/17 2123 .  pantoprazole (PROTONIX) injection 40 mg, 40 mg, Intravenous, Q24H, Deterding, Guadelupe Sabin, MD, 40 mg at 10/08/17 0427 .  piperacillin-tazobactam (ZOSYN) IVPB 3.375 g, 3.375 g, Intravenous, Q8H, Masters, Jake Church, [facility], Stopped at 10/08/17 1522 .  pravastatin (PRAVACHOL) tablet 20 mg, 20 mg, Oral, Meredith Leeds, MD, 20 mg at 10/08/17 1123 .  propofol (DIPRIVAN) 1000 MG/100ML infusion, 5-80 mcg/kg/min, Intravenous, Titrated, Sampson Goon, MD, Last Rate: 9.6 mL/hr at 10/08/17 1500, 25 mcg/kg/min at 10/08/17 1500 .  senna-docusate (Senokot-S) tablet 1 tablet, 1 tablet, Oral, QHS PRN, Opyd, [name] S, MD .  sodium chloride flush (NS) 0.9 % injection 10-40 mL, 10-40 mL, Intracatheter, Q12H, Cherene Altes, MD, 20 mL at 10/08/17 0926 .  sodium chloride flush (NS) 0.9 % injection 10-40 mL, 10-40 mL, Intracatheter, PRN, Cherene Altes, MD .  thiamine (B-1) injection 100 mg, 100 mg, Intravenous, Daily, Sampson Goon, MD, 100 mg at 10/08/17 5038 .  vancomycin (VANCOCIN) IVPB 1000 mg/200 mL premix, 1,000 mg, Intravenous, Q24H, Masters, Jake Church, RPH, Last Rate: 200 mL/hr at 10/08/17 1524, 1,000 mg at 10/08/17 1524    PHYSICAL EXAM:      Blood pressure (!) 142/66, pulse 90, temperature 98 F (36.7 C), temperature source Axillary, resp. rate (!) 26, height '5\' 4"'  (1.626 m), weight 142 lb 13.7 oz (64.8 kg), SpO2 100  %.   Eyes: Pupils equal,No pallor Neck: No neck swelling CVS:  -s1 s2 regular Resp:Breath sounds equal; no rales Abdomen:abd-soft, non distended,    BS+ Extremities:No edema Neuro:sedated,tone  Normal Skin: no rash Integumentary: No clubbing    Ventilator  Fi02- 40%, Peep- 5, Rate- 20, Vt- 430m Pip- 20s  No AutoPEEP   Results for GANVITHA, HUTMACHER(MRN [id number] as of 10/08/2017 18:12  Ref. Range 10/08/2017 04:52  Sodium Latest Ref Range: 135 - 145 mmol/L 149 (H)  Potassium Latest Ref Range: 3.5 - 5.1 mmol/L 3.8  Chloride Latest Ref Range: 101 - 111 mmol/L 124 (H)  CO2 Latest Ref Range: 22 - 32 mmol/L 16 (L)  Glucose Latest Ref Range: 65 - 99 mg/dL 229 (H)  BUN Latest Ref Range: 6 - 20 mg/dL 18  Creatinine Latest Ref Range: 0.44 - 1.00 mg/dL 1.23 (H)  Calcium Latest Ref Range: 8.9 - 10.3 mg/dL 7.7 (L)  Anion gap Latest Ref Range: 5 - 15  9  Alkaline Phosphatase Latest Ref Range: 38 - 126 U/L 64  Albumin Latest Ref Range: 3.5 - 5.0 g/dL 2.2 (L)  AST Latest Ref Range: 15 - 41 U/L 74 (H)  ALT Latest Ref Range: 14 - 54 U/L 22  Total Protein Latest Ref Range: 6.5 - 8.1 g/dL 5.3 (L)  Total Bilirubin Latest Ref Range: 0.3 - 1.2 mg/dL 0.7  GFR, Est Non African American Latest Ref Range: >60 mL/min 47 (L)  GFR, Est African American Latest Ref Range: >60 mL/min 55 (L)  Procalcitonin Latest Units: ng/mL 0.72  WBC Latest Ref Range: 4.0 - 10.5 K/uL 9.8  RBC Latest Ref Range: 3.87 - 5.11 MIL/uL 3.03 (L)  Hemoglobin Latest Ref Range: 12.0 - 15.0 g/dL 8.8 (L)  HCT Latest Ref Range: 36.0 - 46.0 % 28.3 (L)  MCV Latest Ref Range: 78.0 - 100.0 fL 93.4  MCH Latest Ref Range: 26.0 - 34.0 pg 29.0  MCHC Latest Ref Range: 30.0 - 36.0 g/dL 31.1  RDW Latest Ref Range: 11.5 - 15.5 % 17.8 (H)  Platelets Latest Ref Range: 150 - 400 K/uL 145 (L)  Neutrophils Latest Units: % 75  Lymphocytes Latest Units: % 18  Monocytes Relative Latest Units: % 5  Eosinophil Latest Units: % 2  Basophil Latest  Units: % 0  NEUT# Latest Ref Range: 1.7 - 7.7 K/uL 7.4  Lymphocyte # Latest Ref Range: 0.7 - 4.0 K/uL 1.8  Monocyte # Latest Ref Range: 0.1 - 1.0 K/uL 0.5  Eosinophils Absolute Latest Ref Range: 0.0 - 0.7 K/uL 0.2  Basophils Absolute Latest Ref Range: 0.0 - 0.1 K/uL 0.0  TSH Latest Ref Range: 0.350 - 4.500 uIU/mL 20.986 (H)  T4,Free(Direct) Latest Ref Range: 0.61 - 1.12 ng/dL 0.50 (L)   Results for GUNEET, DELPINO (MRN [id number]) as of 10/08/2017 18:12  Ref. Range 10/08/2017 03:27  pH, Arterial Latest Ref Range: 7.350 - 7.450  7.410  pCO2 arterial Latest Ref Range: 32.0 - 48.0 mmHg 23.9 (L)  pO2, Arterial Latest Ref Range: 83.0 - 108.0 mmHg 124 (H)  Acid-base deficit Latest Ref Range: 0.0 - 2.0 mmol/L 8.9 (H)  Bicarbonate Latest Ref Range: 20.0 - 28.0 mmol/L 14.8 (L)        CHEST X-RAY:  10/08/17 Endotracheal tube tip in good position between the level the clavicles and carina. Enteric feeding tube courses to the abdomen, tip not included. Right PICC line tip at the cavoatrial junction level. Improved lung volumes and left lung ventilation since 10/06/2017. Patchy and confluent residual left lung base opacity. No pneumothorax or pulmonary edema. The right lung is clear allowing for portable technique. No pleural effusion is evident. Paucity bowel gas in the upper abdomen. IMPRESSION: 1.  Stable lines and tubes. 2. Improved left lung ventilation since 10/06/2017. Nonspecific residual opacity at the left lung base  Ekg-12.9.18 sinus- no acute changes     12/9 echo - Left ventricle: The cavity size was normal. Wall thickness was increased in a pattern of mild LVH. Systolic function was normal. The estimated ejection fraction was in the range of 60% to 65%. Wall motion was normal; there were no regional wall motion abnormalities. Doppler parameters are consistent with abnormal left ventricular relaxation (grade 1 diastolic dysfunction). - Mitral valve:  There was mild regurgitation.   Ct brain 12/14  1. New, large area of cytotoxic edema within the posterior right hemisphere, involving the posterior parietal and temporal lobes and the occipital lobe. This includes portions of the posterior right MCA territory and the PCA territory. 2. Unchanged cytotoxic edema of the right cerebellar hemisphere with mass effect on the fourth ventricle. 3. No herniation, hydrocephalus or hemorrhage.   12/9 MRI/MRA brain 1. Acute/subacute inferior right cerebellum and vermis infarct with associated hemorrhage. 2. Additional scattered punctate foci of acute nonhemorrhagic infarct involving the right PCA territory with 1 lesion involving the right MCA territory along the right middle frontal gyrus. 3. Multiple foci of remote encephalomalacia involving the inferior posterior  right temporal lobe, lateral right temporal lobe, and high right parietal lobe. 4. 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 The right A1 and likely M1 segments fill via a patent anterior communicating artery. 5. Fenestration of the vertebrobasilar junction without significant basilar artery stenosis. 6. Marked attenuation of PCA branch vessels, worse on the right. Moderate proximal right PCA stenosis. 7. Fetal type left posterior cerebral artery.Assessment- Plan:     Assessment-Plan:  Acute, large Right cerebellar stroke with hemorrhage and mass effect;  acute nonhemorrhagic infarct -Right PCA territory Multiple foci of remote encephalomalacia - inferior posterior right temporal lobe, lateral right temporal lobe, and high right parietal lobe; High-grade stenosis or occlusion of the cavernous right internal carotid artery.  Moderate proximal right PCA stenosis Suspected Etiology: Likely large vessel disease with high-grade terminal right ICA stenosis/occlusion and intracranial atherosclerosis  Stroke Risk Factors:  hyperlipidemia, hypertension,CAD, PVD Other Stroke Risk Factors: Advanced age, Cigarette smoker, Migraines  AKI (acute kidney injury)   pyelonephritis/Hydronephrosis (R) Hypernatremia acute Encephalopathy MSSA bacteremia Aspiration   Acute Respiratory Failure secondary to aspiration and CVA.     Neurology Airway protected by ETT Monitor HD, glucose, Na On ASA now May consider DAPT on discharge if pt condition improves Permissive hypertension (OK if <220/120) for 24-48 hours post stroke and then gradually normalized within 5-7 days. Long term BP goal130-150 due to right ICA occlusion    Cardiology  BP management as above TEE planned   Pulmonary   SBTas tolerated; resume AC mode for protected airway for TEE. Await Neuro recovery before extubation. F/u CXR, ABG  GI /nutrition  Tube feeds Lactobacillus for diarrhea CDT negative  Renal, fluids, electrolytes Monitor lytes  On hypotonic fluids    Hematology Monitor CBC On ASA,  Endocrinology  On synthroid  Infectious diseases On vanco +zosyn  Lines -Devices 12/14 rectal tube,ETT, 12/12  Gastric tube 12/9 PICC  12/8 Foley     Husband counseled    Goals of Care:   Full code     Thank you for letting me participate in the care of your patient.  ^^^^^^^^^^ I  Have personally spent  55  Minutes  In the care of this Patient providing Critical care Services; Time includes review of chart, labs, imaging, coordinating care with other physicians and healthcare team members. Also includes time for frequent reevaluation and additional treatment implementation due to change in clinical condiiton of patient. Excludes time spent for Procedure and Teaching.   ^^^^^^^^^^  Note subject to typographical and grammatical errors;   Any formal questions or concerns about the content, text, or information contained within the body of this dictation should be directly addressed to the physician   for  clarification.   Evans Lance, MD Pulmonary and [place] Pager: [phone]

## 2017-10-08 NOTE — Progress Notes (Signed)
STROKE TEAM PROGRESS NOTE   SUBJECTIVE (INTERVAL HISTORY) Pt RN at bedside. Pt still intubated on low dose propofol but able to open eyes on voice, moving RUE and RLE spontaneously but still not following commands. Intermittent high grade fever. Has diarrhea but C diff neg. Cre improved.    OBJECTIVE Lab Results: CBC:  Recent Labs  Lab 10/06/17 0621 10/07/17 0500 10/08/17 0452  WBC 8.7 9.7 9.8  HGB 9.5* 9.0* 8.8*  HCT 29.8* 28.6* 28.3*  MCV 92.3 93.2 93.4  PLT 135* 131* 145*   BMP: Recent Labs  Lab 10/04/17 1520 2017-10-27 0523 10/27/2017 1709 10/06/17 0621 10/06/17 2020 10/07/17 0500 10/08/17 0452  NA  --  168*  --  157* 158* 155* 149*  K  --  3.2*  --  5.2* 3.1* 3.1* 3.8  CL  --  >130*  --  >130* >130* 115* 124*  CO2  --  19*  --  18* 19* 16* 16*  GLUCOSE  --  128*  --  563* 118* 233* 229*  BUN  --  34*  --  23* 20 18 18   CREATININE  --  2.01*  --  1.43* 1.36* 1.73* 1.23*  CALCIUM  --  7.9*  --  7.0* 7.5* 6.7* 7.7*  MG 2.2 2.2 2.2 2.1 2.1  --   --   PHOS 3.5 4.0 2.8  --   --   --   --    Liver Function Tests:  Recent Labs  Lab 10/08/17 0452  AST 74*  ALT 22  ALKPHOS 64  BILITOT 0.7  PROT 5.3*  ALBUMIN 2.2*   No results for input(s): AMMONIA in the last 168 hours. Cardiac Enzymes:  Recent Labs  Lab 10/01/17 1730  TROPONINI 0.58*   Coagulation Studies:  No results for input(s): APTT, INR in the last 72 hours. PHYSICAL EXAM Temp:  [98.2 F (36.8 C)-103.1 F (39.5 C)] 98.2 F (36.8 C) (12/16 0400) Pulse Rate:  [89-116] 89 (12/16 0735) Resp:  [20-32] 28 (12/16 0735) BP: (125-158)/(67-89) 138/73 (12/16 0735) SpO2:  [87 %-100 %] 100 % (12/16 0735) FiO2 (%):  [40 %] 40 % (12/16 0743) Weight:  [142 lb 13.7 oz (64.8 kg)] 142 lb 13.7 oz (64.8 kg) (12/16 0500)  General - Well nourished, well developed, intubated.  Ophthalmologic - Fundi not visualized due to noncooperation.  Cardiovascular - Regular rate and rhythm.  Neuro - intubated on low dose  sedation, not following commands, eyes easily open on voice stimulation. PERRL, not blinking to visual threat bilaterally, doll's eyes sluggish, eyes downward gaze preference, positive corneal, gag and cough. On pain stimulation, RUE and RLE withdraw against gravity, on pain stimulation LUE 1/5 and 2/5 LLE. DTR 1+ and positive babinski bilaterally. Sensation, coordination and gait not tested.   IMAGING: I have personally reviewed the radiological images below and agree with the radiology interpretations.  Ct Head Wo Contrast 10/01/2017 IMPRESSION: No significant change since yesterday. Acute infarction affecting the right cerebellum with swelling. Mass-effect upon the fourth ventricle but no change in size the lateral or third ventricles at this time. No hemorrhage. 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 Electronically Signed   By: Paulina Fusi M.D.   On: 10/01/2017 07:45   MRI/MRA Brain Wo Contrast 10/01/2017 IMPRESSION: 1. Acute/subacute inferior right cerebellum and vermis infarct with associated hemorrhage. 2. Additional scattered punctate foci of acute nonhemorrhagic infarct involving the right PCA territory with 1 lesion involving the right MCA territory along the right middle frontal  gyrus. 3. Multiple foci of remote encephalomalacia involving the inferior posterior right temporal lobe, lateral right temporal lobe, and high right parietal lobe. 4. 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 The right A1 and likely M1 segments fill via a patent anterior communicating artery. 5. Fenestration of the vertebrobasilar junction without significant basilar artery stenosis. 6. Marked attenuation of PCA branch vessels, worse on the right. Moderate proximal right PCA stenosis. 7. Fetal type left posterior cerebral artery.   Echocardiogram:  - Left ventricle: The cavity size was normal. Wall thickness was   increased in a pattern of mild LVH.  Systolic function was normal.   The estimated ejection fraction was in the range of 60% to 65%.   Wall motion was normal; there were no regional wall motion   abnormalities. Doppler parameters are consistent with abnormal   left ventricular relaxation (grade 1 diastolic dysfunction). - Mitral valve: There was mild regurgitation.  B/L Carotid U/S:                                                Right ECA and ICA waveforms are very similar. Unable to interrogate stenosis.  40-59% left ICA stenosis. Bilateral vertebral artery flow is antegrade.   CT Head: 10/02/17                                                   1. No significant change in the appearance of acute to subacute, hemorrhagic right inferior cerebellar infarct with slight positive mass effect on the adjacent fourth ventricle. No dilatation the third or lateral ventricles. 2. Chronic right parietal, posteromedial temporal and occipital lobe infarcts as well as right periventricular white matter tract lacunar infarct.   EEG 10/02/17:   This normal EEG is recorded in the waking state     Ct Head Wo Contrast 10/06/2017 IMPRESSION: 1. New, large area of cytotoxic edema within the posterior right hemisphere, involving the posterior parietal and temporal lobes and the occipital lobe. This includes portions of the posterior right MCA territory and the PCA territory. 2. Unchanged cytotoxic edema of the right cerebellar hemisphere with mass effect on the fourth ventricle. 3. No herniation, hydrocephalus or hemorrhage.                ASSESSMENT: Ms. Leane ParaDebra A Haworth is a 61 y.o. female with PMH of prior hypertension, coronary artery disease, hypothyroidism, chronic pain, depression, smoker and peripheral arterial disease status post aorto-iliac bypass in June admitted for fever, confusion, right flank pain. Found to have right pyelonephritis and hydronephrosis. Also MRI showed right cerebellar large infarcts with right MCA scattered infarcts. B  Cx showed MSSA. On Abx. However, repeat CT head on 10/06/17 showed new large right MCA infarcts. Concerning for endocarditis.   Acute, large Right cerebellar stroke with petechial hemorrhagic conversion as well as acute large right MCA and PCA infarct - concerning for endocarditis. Pt also found to have High-grade stenosis or occlusion of the cavernous right internal carotid artery.    Resultant - intubated on sedation, left hemiparesis  CT head 10/02/17 right cerebellar large infarct  MRI head 10/01/17 right cerebellar large infarct and right MCA scattered small infarcts. Old right PCA,  right caudate, right parietal high convexity  MRA head - right ICA cavernous high grade stenosis vs. Occlusion, b/l M1 stenosis, right PCA distal stenosis  CT head 10/06/17 - new large right MCA and PCA infarct with cytotoxic edema  Will consider CTA head and neck once Cre normalized  CUS - waveform consistent with right ICA distal stenosis vs. Occlusion  TTE EF 60-65%, no vegetation  TEE pending  Heparin subq for DVT prophylaxis  NPO for now  No antithrombotics PTA, now on ASA 325mg .  Therapy recommendation - pending  Deposition - pending  Sepsis with pyelonephritis and bacteremia - ? Endocarditis  Intermittent high grade fever  Admitted for pyelonephritis with hydronephrosis confirmed on CT  B Cx positive for MSSA, repeat culture neg  Urine culture neg  Sputum culture pending  On zosyn and vanco  Due to recurrent stroke, concerning for endocarditis  TEE pending  Respiratory failure  Intubated 10/06/17  CCM on board  On low dose sedation  On weaning trial  AKI  Cre 1.64->2.01->1.36->1.73->1.23  On IVF  CCM on board  Continue to monitor  Consider CTA head and neck once Cre normalized  Intracranial atherosclerosis and stenosis:  MRA showed right ICA distal occlusion, b/l M1 stenosis, right PCA distal stenosis  Will consider CTA head and neck once Cre  normalized  CUS consistent with right ICA distal stenosis or occlusion  On ASA now  May consider DAPT on discharge if pt condition improves  HTN  Stable, some elevated B/P's noted overnight  BP goal 130-150 due to right ICA occlusion  HYPERLIPIDEMIA  Home Meds:  Pravachol 20 mg  LDL 46,  goal < 70  AST 183->74  ALT 72->22  Resume pravachol 20  TOBACCO ABUSE  Current smoker  Smoking cessation counseling when applicable  Nicotine patch PRN  Other Active Problems:  Hyperglycemia - on D5 and 1/2 NS  Hospital day # 8  This patient is critically ill due to recurrent stroke, sepsis, fever, bacteremia, AKI, intubated and at significant risk of neurological worsening, death form recurrent stroke, hemorrhagic conversion, septic shock, seizure, endocarditis, kidney failure. This patient's care requires constant monitoring of vital signs, hemodynamics, respiratory and cardiac monitoring, review of multiple databases, neurological assessment, discussion with family, other specialists and medical decision making of high complexity. I had long discussion with husband at bedside, updated pt current condition, treatment plan and potential prognosis. He expressed understanding and appreciation. I spent 35 minutes of neurocritical care time in the care of this patient.  Marvel PlanJindong Murl Zogg, MD PhD Stroke Neurology 10/08/2017 10:45 AM   To contact Stroke Continuity provider, please refer to [email or web address]. After hours, contact General Neurology

## 2017-10-09 ENCOUNTER — Inpatient Hospital Stay (HOSPITAL_COMMUNITY): Payer: BLUE CROSS/BLUE SHIELD

## 2017-10-09 ENCOUNTER — Encounter (HOSPITAL_COMMUNITY): Payer: Self-pay | Admitting: Radiology

## 2017-10-09 DIAGNOSIS — I63541 Cerebral infarction due to unspecified occlusion or stenosis of right cerebellar artery: Secondary | ICD-10-CM

## 2017-10-09 DIAGNOSIS — Z9911 Dependence on respirator [ventilator] status: Secondary | ICD-10-CM

## 2017-10-09 DIAGNOSIS — R197 Diarrhea, unspecified: Secondary | ICD-10-CM

## 2017-10-09 DIAGNOSIS — Z95828 Presence of other vascular implants and grafts: Secondary | ICD-10-CM

## 2017-10-09 DIAGNOSIS — J690 Pneumonitis due to inhalation of food and vomit: Secondary | ICD-10-CM

## 2017-10-09 DIAGNOSIS — R0600 Dyspnea, unspecified: Secondary | ICD-10-CM

## 2017-10-09 LAB — BASIC METABOLIC PANEL
ANION GAP: 7 (ref 5–15)
BUN: 13 mg/dL (ref 6–20)
CALCIUM: 7.8 mg/dL — AB (ref 8.9–10.3)
CHLORIDE: 119 mmol/L — AB (ref 101–111)
CO2: 16 mmol/L — AB (ref 22–32)
Creatinine, Ser: 1.05 mg/dL — ABNORMAL HIGH (ref 0.44–1.00)
GFR calc non Af Amer: 57 mL/min — ABNORMAL LOW (ref >60)
Glucose, Bld: 147 mg/dL — ABNORMAL HIGH (ref 65–99)
Potassium: 3.7 mmol/L (ref 3.5–5.1)
Sodium: 142 mmol/L (ref 135–145)

## 2017-10-09 LAB — CULTURE, RESPIRATORY W GRAM STAIN: Culture: NO GROWTH

## 2017-10-09 LAB — GLUCOSE, CAPILLARY
GLUCOSE-CAPILLARY: 172 mg/dL — AB (ref 65–99)
GLUCOSE-CAPILLARY: 142 mg/dL — AB (ref 65–99)
GLUCOSE-CAPILLARY: 111 mg/dL — AB (ref 65–99)
GLUCOSE-CAPILLARY: 142 mg/dL — AB (ref 65–99)
Glucose-Capillary: 121 mg/dL — ABNORMAL HIGH (ref 65–99)
Glucose-Capillary: 304 mg/dL — ABNORMAL HIGH (ref 65–99)

## 2017-10-09 LAB — CULTURE, RESPIRATORY: SPECIAL REQUESTS: NORMAL

## 2017-10-09 LAB — CBC
HEMATOCRIT: 25.9 % — AB (ref 36.0–46.0)
HEMOGLOBIN: 8.4 g/dL — AB (ref 12.0–15.0)
MCH: 29.8 pg (ref 26.0–34.0)
MCHC: 32.4 g/dL (ref 30.0–36.0)
MCV: 91.8 fL (ref 78.0–100.0)
Platelets: 173 10*3/uL (ref 150–400)
RBC: 2.82 MIL/uL — ABNORMAL LOW (ref 3.87–5.11)
RDW: 17.1 % — ABNORMAL HIGH (ref 11.5–15.5)
WBC: 9.6 10*3/uL (ref 4.0–10.5)

## 2017-10-09 LAB — TRIGLYCERIDES: TRIGLYCERIDES: 403 mg/dL — AB (ref <150)

## 2017-10-09 MED ORDER — IOPAMIDOL (ISOVUE-370) INJECTION 76%
INTRAVENOUS | Status: AC
  Administered 2017-10-09: 100 mL
  Filled 2017-10-09: qty 50

## 2017-10-09 MED ORDER — LOPERAMIDE HCL 1 MG/5ML PO LIQD
1.0000 mg | Freq: Once | ORAL | Status: AC
  Administered 2017-10-09: 1 mg
  Filled 2017-10-09: qty 5

## 2017-10-09 MED ORDER — AMLODIPINE BESYLATE 5 MG PO TABS: 5.0000 mg | ORAL_TABLET | Freq: Every day | ORAL | Status: DC

## 2017-10-09 NOTE — Progress Notes (Signed)
Rehab admissions - I am following for potential acute inpatient rehab admission once patient is medically ready.  Noted patient is on the vent at this time.  Will follow along for progress.  Call me for questions.  #[phone]

## 2017-10-09 NOTE — Progress Notes (Signed)
CRITICAL CARE PROGRESS NOTE   Mechanical Ventilation day #4 post stroke day9  Name:Ashlee Snow SEG:[id number] DOB:1956-06-14  ADMISSION DATE:10/01/2017  CHIEF COMPLAINT:Fever and nausea HISTORY OF PRESENT ILLNESS: This is a 61 year old with a past medical history of fibromyalgia, hypertension, and hypothyroidism who for several days prior to admission had apparently been suffering from nausea and vomiting. She subsequently had fevers and was evaluated at an outside hospital where she was found to have left hydronephrosis. A ureteral stent was placed and she was treated for urosepsis with antibiotics and generous fluids. She has grown MSSA from her blood.  [facility]  urine sample & culture was contaminated,so we have no culture of her urine on presentation..CT scan of the head has shown a cerebellar infarction.  Her oxygen requirements increased substantially on 12/10 and Levaquin was added to nafcillin to cover for presumed aspiration. Zosyn was subsequently substituted for both. Her oxygen requirements have improved, but her mental status has not. Today she is febrile. She is having liquid stool.  PAST MEDICAL HISTORY : Ashlee Snow a past medical history of Asthma, Bronchitis, DDD (degenerative disc disease), Depression, Dyspnea, Fibromyalgia, GERD (gastroesophageal reflux disease), History of depression, Hypertension, Hypothyroidism, Leg pain, Migraine, Peripheral arterial disease ([place]), Peripheral vascular disease ([place]), Sciatica, Stroke (cerebrum) ([place]) (10/02/2017), Thyroid disease, TMJ (temporomandibular joint disorder) (08/24/1991), and Ulcer.  PAST SURGICAL HISTORY: Ashlee Snow a past surgical history that includes Cholecystectomy (01/22/1997); Abdominal hysterectomy; Ovarian cyst surgery; aortogram w/ stenting (2007, 2011, Aug. 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   Subjective:  on CPAP trial. No fever Diarrhea persists Awaiting TEE today  Scheduled Meds: .  stroke: mapping our early stages of recovery book   Does not apply Once  . [START ON 10/10/2017] amLODipine  5 mg Per Tube Daily  . aspirin  325 mg Oral Daily  . chlorhexidine gluconate (MEDLINE KIT)  15 mL Mouth Rinse BID  . Chlorhexidine Gluconate Cloth  6 each Topical Daily  . heparin injection (subcutaneous)  5,000 Units Subcutaneous Q8H  . insulin aspart  0-15 Units Subcutaneous Q4H  . ipratropium-albuterol  3 mL Nebulization BID  . lactobacillus  1 g Oral Daily  . levothyroxine  50 mcg Intravenous Daily  . mouth rinse  15 mL Mouth Rinse 10 times per day  . pantoprazole (PROTONIX) IV  40 mg Intravenous Q24H  . pravastatin  20 mg Oral [id number]  . sodium chloride flush  10-40 mL Intracatheter Q12H  . thiamine injection  100 mg Intravenous Daily   Continuous Infusions: . dextrose 5 % and 0.45 % NaCl with KCl 20 mEq/L 80 mL/hr at 10/09/17 0019  . feeding supplement (VITAL AF 1.2 CAL) Stopped (10/09/17 0000)  . piperacillin-tazobactam (ZOSYN)  IV Stopped (10/09/17 0626)  . propofol (DIPRIVAN) infusion 30 mcg/kg/min (10/09/17 0010)  . vancomycin Stopped (10/08/17 1624)   PRN Meds:.acetaminophen **OR** acetaminophen (TYLENOL) oral liquid 160 mg/5 mL **OR** acetaminophen, albuterol, labetalol, morphine injection, senna-docusate, sodium chloride flush  PHYSICAL EXAM:     Vitals:   10/09/17 0956 10/09/17 1000  BP:  126/65  Pulse: 82 81  Resp:  (!) 28  Temp:    SpO2: 100% 100%    Eyes: Pupils equal,No pallor Neck: No neck swelling CVS: -s1 s2 regular Resp:Breath sounds equal; no rales Abdomen:abd-soft, nondistended, BS+ Extremities:No edema Neuro:sedated,tone Normal; follows commands. Skin: no rash Integumentary: No clubbing  Ventilator Fi02- 40%, Peep- 5,  Rate- 20, Vt- 465m Pip- 20s No AutoPEEP    Results for Ashlee, Snow(MRN [id number] as of 10/09/2017 10:34  Ref. Range 10/09/2017 05:00  Sodium Latest Ref Range: 135 - 145 mmol/L 142  Potassium Latest Ref Range: 3.5 - 5.1 mmol/L 3.7  Chloride Latest Ref Range: 101 - 111 mmol/L 119 (H)  CO2 Latest Ref Range: 22 - 32 mmol/L 16 (L)  Glucose Latest Ref Range: 65 - 99 mg/dL 147 (H)  BUN Latest Ref Range: 6 - 20 mg/dL 13  Creatinine Latest Ref Range: 0.44 - 1.00 mg/dL 1.05 (H)  Calcium Latest Ref Range: 8.9 - 10.3 mg/dL 7.8 (L)  Anion gap Latest Ref Range: 5 - 15  7  GFR, Est Non African American Latest Ref Range: >60 mL/min 57 (L)  GFR, Est African American Latest Ref Range: >60 mL/min >60  WBC Latest Ref Range: 4.0 - 10.5 K/uL 9.6  RBC Latest Ref Range: 3.87 - 5.11 MIL/uL 2.82 (L)  Hemoglobin Latest Ref Range: 12.0 - 15.0 g/dL 8.4 (L)  HCT Latest Ref Range: 36.0 - 46.0 % 25.9 (L)  MCV Latest Ref Range: 78.0 - 100.0 fL 91.8  MCH Latest Ref Range: 26.0 - 34.0 pg 29.8  MCHC Latest Ref Range: 30.0 - 36.0 g/dL 32.4  RDW Latest Ref Range: 11.5 - 15.5 % 17.1 (H)  Platelets Latest Ref Range: 150 - 400 K/uL 173    Results for Ashlee, Snow(MRN [id number] as of 10/09/2017 10:34  Ref. Range 10/08/2017 03:27  pH, Arterial Latest Ref Range: 7.350 - 7.450  7.410  pCO2 arterial Latest Ref Range: 32.0 - 48.0 mmHg 23.9 (L)  pO2, Arterial Latest Ref Range: 83.0 - 108.0 mmHg 124 (H)  Acid-base deficit Latest Ref Range: 0.0 - 2.0 mmol/L 8.9 (H)  Bicarbonate Latest Ref Range: 20.0 - 28.0 mmol/L 14.8 (L)    Results for Ashlee, Snow(MRN [id number] as of 10/09/2017 10:34  Ref. Range 10/09/2017 05:00  Sodium Latest Ref Range: 135 - 145 mmol/L 142  Potassium Latest Ref Range: 3.5 - 5.1 mmol/L 3.7  Chloride Latest Ref Range: 101 - 111 mmol/L 119 (H)  CO2 Latest Ref Range: 22 - 32 mmol/L 16 (L)  Glucose Latest Ref Range: 65 - 99 mg/dL 147 (H)  BUN Latest Ref Range: 6 - 20 mg/dL 13  Creatinine  Latest Ref Range: 0.44 - 1.00 mg/dL 1.05 (H)  Calcium Latest Ref Range: 8.9 - 10.3 mg/dL 7.8 (L)  Anion gap Latest Ref Range: 5 - 15  7       CHEST X-RAY:12/17 Endotracheal tube tip 2 cm above the carina. Right upper extremity PICC with tip at the SVC. Feeding tube which at least reaches the stomach. Bandlike atelectasis at the left base. No edema, effusion, or pneumothorax. Borderline heart size. Negative aortic and hilar Contours.  Ekg-12.9.18sinus- no acute changes     12/9 echo - Left ventricle: The cavity size was normal. Wall thickness was increased in a pattern of mild LVH. Systolic function was normal. The estimated ejection fraction was in the range of 60% to 65%. Wall motion was normal; there were no regional wall motion abnormalities. Doppler parameters are consistent with abnormal left ventricular relaxation (grade 1 diastolic dysfunction). - Mitral valve: There was mild regurgitation.   Ct brain 12/14 1. New, large area of cytotoxic edema within the posterior right hemisphere, involving the posterior parietal and temporal lobes and the occipital lobe. This includes portions of  the posterior right MCA territory and the PCA territory. 2. Unchanged cytotoxic edema of the right cerebellar hemisphere with mass effect on the fourth ventricle. 3. No herniation, hydrocephalus or hemorrhage.   12/9 MRI/MRA brain 1. Acute/subacute inferior right cerebellum and vermis infarct with associated hemorrhage. 2. Additional scattered punctate foci of acute nonhemorrhagic infarct involving the right PCA territory with 1 lesion involving the right MCA territory along the right middle frontal gyrus. 3. Multiple foci of remote encephalomalacia involving the inferior posterior right temporal lobe, lateral right temporal lobe, and high right parietal lobe. 4. 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 The right A1  and likely M1 segments fill via a patent anterior communicating artery. 5. Fenestration of the vertebrobasilar junction without significant basilar artery stenosis. 6. Marked attenuation of PCA branch vessels, worse on the right. Moderate proximal right PCA stenosis. 7. Fetal type left posterior cerebral artery.Assessment- Plan:     Assessment-Plan:  Acute, large Right cerebellar stroke with hemorrhage and mass effect; acute nonhemorrhagic infarct -Right PCA territory Multiple foci of remote encephalomalacia - inferior posterior right temporal lobe, lateral right temporal lobe, and high right parietal lobe; High-grade stenosis or occlusion of the cavernous right internal carotid artery.  Moderate proximal right PCA stenosis Suspected Etiology: Likely large vessel disease with high-grade terminal right ICA stenosis/occlusion and intracranial atherosclerosis  Stroke Risk Factors: hyperlipidemia, hypertension,CAD, PVD Other Stroke Risk Factors: Advanced age, Cigarette smoker, Migraines  AKI (acute kidney injury)   pyelonephritis/Hydronephrosis (R) acuteEncephalopathy MSSA bacteremia Aspiration Acute Respiratory Failure secondary to aspiration and CVA.   Neurology Airway protected by ETT Monitor HD, glucose, Na On ASA now May consider DAPT on discharge if pt condition improves Permissive hypertension (OK if <220/120) for 24-48 hours post stroke and then gradually normalized within 5-7 days. Long term BP goal130-150 due to right ICA occlusion   Cardiology BP management as above TEE planned   Pulmonary  SBTas tolerated; resume AC mode for protected airway for TEE. Await Neuro recovery before extubation. F/u CXR, ABG Extubate when TEE done and pt passes SBT   GI /nutrition Tube feeds Lactobacillus for diarrhea CDT negative Imodium prn  Renal, fluids, electrolytes Monitor lytes  On hypotonic fluids   Hematology Monitor  CBC On ASA,  Endocrinology On synthroid  Infectious diseases On vanco +zosyn  Lines -Devices 12/14 rectal tube,ETT, 12/12 Gastric tube 12/9 PICC  12/8 Foley   Husband counseled   Goals of Care:Full code     Thank you for letting me participate in the care of your patient.  ^^^^^^^^^^ I Have personally spent 50 Minutes In the care of this Patient providing Critical care Services; Time includes review of chart, labs, imaging, coordinating care with other physicians and healthcare team members. Also includes time for frequent reevaluation and additional treatment implementation due to change in clinical condiiton of patient. Excludes time spent for Procedure and Teaching.   ^^^^^^^^^^  Note subject to typographical and grammatical errors;  Any formal questions or concerns about the content, text, or information contained within the body of this dictation should be directly addressed to the physician for clarification.   Evans Lance, MD Pulmonary and [place] Pager: [phone]

## 2017-10-09 NOTE — Progress Notes (Signed)
CSW received phone call from pt's husband, Mr. Kennedy BuckerGrant. Pt's husband's phone number is [phone]. Mr. Kennedy BuckerGrant was calling to receive information about becoming his wife's power of attorney. CSW informed husband that she was not familiar with the process, but would attempt to find answers for him. Pt's husband stated he was told he could receive assistance through the hospital. Pt's husband has meet with an attorney. The attorney is requesting a letter stating the pt's condition and expected time her condition will last.   Plan: CSW will seek assistance from coworkers and leadership. CSW will call pt's husband back with any updates.   Montine CircleKelsy Macenzie Burford, Silverio LayLCSWA [facility]  [phone]

## 2017-10-09 NOTE — Progress Notes (Signed)
   10/09/17 1000  Clinical Encounter Type  Visited With Patient  Visit Type Follow-up  Referral From Nurse  Spiritual Encounters  Spiritual Needs Literature  Chaplain visited with PT who was sleeping.  PT is not able to sign an advanced directive at this time as they are intubated.  The PT will likely get extubated today and a new consult will be generated in the system.

## 2017-10-09 NOTE — Progress Notes (Signed)
STROKE TEAM PROGRESS NOTE   SUBJECTIVE (INTERVAL HISTORY) Pt RN and PT team at bedside. Pt still intubated on low dose propofol, no significant neuro change overnight. LUE and LLE movement has some improvement than yesterday. Cre normalized. Plan to extubate today and have TEE.    OBJECTIVE Lab Results: CBC:  Recent Labs  Lab 10/07/17 0500 10/08/17 0452 10/09/17 0500  WBC 9.7 9.8 9.6  HGB 9.0* 8.8* 8.4*  HCT 28.6* 28.3* 25.9*  MCV 93.2 93.4 91.8  PLT 131* 145* 173   BMP: Recent Labs  Lab 10/04/17 1520 10/23/2017 0523 10/13/2017 1709 10/06/17 0621 10/06/17 2020 10/07/17 0500 10/08/17 0452 10/09/17 0500  NA  --  168*  --  157* 158* 155* 149* 142  K  --  3.2*  --  5.2* 3.1* 3.1* 3.8 3.7  CL  --  >130*  --  >130* >130* 115* 124* 119*  CO2  --  19*  --  18* 19* 16* 16* 16*  GLUCOSE  --  128*  --  563* 118* 233* 229* 147*  BUN  --  34*  --  23* 20 18 18 13   CREATININE  --  2.01*  --  1.43* 1.36* 1.73* 1.23* 1.05*  CALCIUM  --  7.9*  --  7.0* 7.5* 6.7* 7.7* 7.8*  MG 2.2 2.2 2.2 2.1 2.1  --   --   --   PHOS 3.5 4.0 2.8  --   --   --   --   --    Liver Function Tests:  Recent Labs  Lab 10/08/17 0452  AST 74*  ALT 22  ALKPHOS 64  BILITOT 0.7  PROT 5.3*  ALBUMIN 2.2*   No results for input(s): AMMONIA in the last 168 hours. Cardiac Enzymes:  No results for input(s): CKTOTAL, CKMB, CKMBINDEX, TROPONINI in the last 168 hours. Coagulation Studies:  No results for input(s): APTT, INR in the last 72 hours. PHYSICAL EXAM Temp:  [98 F (36.7 C)-101 F (38.3 C)] 99.7 F (37.6 C) (12/17 0800) Pulse Rate:  [81-101] 82 (12/17 0956) Resp:  [24-32] 28 (12/17 0900) BP: (116-151)/(61-76) 138/69 (12/17 0900) SpO2:  [98 %-100 %] 100 % (12/17 0956) FiO2 (%):  [30 %-40 %] 30 % (12/17 0800) Weight:  [143 lb 1.3 oz (64.9 kg)] 143 lb 1.3 oz (64.9 kg) (12/17 0500)  General - Well nourished, well developed, intubated.  Ophthalmologic - Fundi not visualized due to  noncooperation.  Cardiovascular - Regular rate and rhythm.  Neuro - intubated on low dose propofol, not following commands, eyes easily open on voice stimulation. PERRL, not blinking to visual threat bilaterally, doll's eyes sluggish, eyes downward gaze, positive corneal, gag and cough. On pain stimulation, RUE and RLE withdraw against gravity, LUE with extension movement and 2/5 LLE withdraw. DTR 1+ and positive babinski bilaterally. Sensation, coordination and gait not tested.   IMAGING: I have personally reviewed the radiological images below and agree with the radiology interpretations.  Ct Head Wo Contrast 10/01/2017 IMPRESSION: No significant change since yesterday. Acute infarction affecting the right cerebellum with swelling. Mass-effect upon the fourth ventricle but no change in size the lateral or third ventricles at this time. No hemorrhage. 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 Electronically Signed   By: Paulina FusiMark  Shogry M.D.   On: 10/01/2017 07:45   MRI/MRA Brain Wo Contrast 10/01/2017 IMPRESSION: 1. Acute/subacute inferior right cerebellum and vermis infarct with associated hemorrhage. 2. Additional scattered punctate foci of acute nonhemorrhagic infarct involving the right  PCA territory with 1 lesion involving the right MCA territory along the right middle frontal gyrus. 3. Multiple foci of remote encephalomalacia involving the inferior posterior right temporal lobe, lateral right temporal lobe, and high right parietal lobe. 4. 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 The right A1 and likely M1 segments fill via a patent anterior communicating artery. 5. Fenestration of the vertebrobasilar junction without significant basilar artery stenosis. 6. Marked attenuation of PCA branch vessels, worse on the right. Moderate proximal right PCA stenosis. 7. Fetal type left posterior cerebral artery.   Echocardiogram:  - Left  ventricle: The cavity size was normal. Wall thickness was   increased in a pattern of mild LVH. Systolic function was normal.   The estimated ejection fraction was in the range of 60% to 65%.   Wall motion was normal; there were no regional wall motion   abnormalities. Doppler parameters are consistent with abnormal   left ventricular relaxation (grade 1 diastolic dysfunction). - Mitral valve: There was mild regurgitation.  B/L Carotid U/S:                                                Right ECA and ICA waveforms are very similar. Unable to interrogate stenosis.  40-59% left ICA stenosis. Bilateral vertebral artery flow is antegrade.   CT Head: 10/02/17                                                   1. No significant change in the appearance of acute to subacute, hemorrhagic right inferior cerebellar infarct with slight positive mass effect on the adjacent fourth ventricle. No dilatation the third or lateral ventricles. 2. Chronic right parietal, posteromedial temporal and occipital lobe infarcts as well as right periventricular white matter tract lacunar infarct.   EEG 10/02/17:   This normal EEG is recorded in the waking state     Ct Head Wo Contrast 10/06/2017 IMPRESSION: 1. New, large area of cytotoxic edema within the posterior right hemisphere, involving the posterior parietal and temporal lobes and the occipital lobe. This includes portions of the posterior right MCA territory and the PCA territory. 2. Unchanged cytotoxic edema of the right cerebellar hemisphere with mass effect on the fourth ventricle. 3. No herniation, hydrocephalus or hemorrhage.   TEE pending  CTA head and neck pending               ASSESSMENT: Ms. MARYAH MARINARO is a 61 y.o. female with PMH of prior hypertension, coronary artery disease, hypothyroidism, chronic pain, depression, smoker and peripheral arterial disease status post aorto-iliac bypass in June admitted for fever, confusion, right flank pain.  Found to have right pyelonephritis and hydronephrosis. Also MRI showed right cerebellar large infarcts with right MCA scattered infarcts. B Cx showed MSSA. On Abx. However, repeat CT head on 10/06/17 showed new large right MCA infarcts. Concerning for endocarditis.   Acute, large Right cerebellar stroke with petechial hemorrhagic conversion as well as acute large right MCA and PCA infarct - concerning for endocarditis. Pt also found to have High-grade stenosis or occlusion of the cavernous right internal carotid artery.    Resultant - intubated on sedation, left hemiparesis  CT head 10/02/17 right cerebellar large infarct  MRI head 10/01/17 right cerebellar large infarct and right MCA scattered small infarcts. Old right PCA, right caudate, right parietal high convexity  MRA head - right ICA cavernous high grade stenosis vs. Occlusion, b/l M1 stenosis, right PCA distal stenosis  CT head 10/06/17 - new large right MCA and PCA infarct with cytotoxic edema  CTA head and neck pending  CUS - waveform consistent with right ICA distal stenosis vs. Occlusion  TTE EF 60-65%, no vegetation  TEE pending  Heparin subq for DVT prophylaxis  NPO for now  No antithrombotics PTA, now on ASA 325mg .  Therapy recommendation - pending  Deposition - pending  Sepsis with pyelonephritis and bacteremia - ? Endocarditis  Intermittent high grade fever  Admitted for pyelonephritis with hydronephrosis confirmed on CT  B Cx positive for MSSA, repeat culture neg  Urine culture neg  Sputum culture neg  On zosyn and vanco  Due to recurrent stroke, concerning for endocarditis  TEE pending  Respiratory failure  Intubated 10/06/17  CCM on board  On low dose sedation  On weaning trial  Plan to extubate today  AKI  Cre 1.64->2.01->1.36->1.73->1.23->1.05  On IVF  CCM on board  Continue to monitor  Intracranial atherosclerosis and stenosis:  MRA showed right ICA distal occlusion, b/l  M1 stenosis, right PCA distal stenosis  CTA head and neck pending  CUS consistent with right ICA distal stenosis or occlusion  On ASA now  May consider DAPT on discharge if pt condition improves  HTN  Stable at 130s  Decrease amlodipine to 5mg  daily  BP goal 130-150 due to right ICA occlusion  HYPERLIPIDEMIA  Home Meds:  Pravachol 20 mg  LDL 46,  goal < 70  AST 183->74  ALT 72->22  Resumed pravachol 20  TOBACCO ABUSE  Current smoker  Smoking cessation counseling when applicable  Nicotine patch PRN  Other Active Problems:  Hypernatremia - resolved  Hyperglycemia - improved  Hospital day # 9  This patient is critically ill due to recurrent stroke, sepsis, fever, bacteremia, AKI, intubated and at significant risk of neurological worsening, death form recurrent stroke, hemorrhagic conversion, septic shock, seizure, endocarditis, kidney failure. This patient's care requires constant monitoring of vital signs, hemodynamics, respiratory and cardiac monitoring, review of multiple databases, neurological assessment, discussion with family, other specialists and medical decision making of high complexity. I had long discussion with husband at bedside, updated pt current condition, treatment plan and potential prognosis. He expressed understanding and appreciation. I spent 35 minutes of neurocritical care time in the care of this patient.  Marvel PlanJindong Chares Slaymaker, MD PhD Stroke Neurology 10/09/2017 10:06 AM   To contact Stroke Continuity provider, please refer to [email or web address]. After hours, contact General Neurology

## 2017-10-09 NOTE — Progress Notes (Signed)
Physical Therapy Treatment Patient Details Name: Ashlee ParaDebra A Carachure MRN: [id number] DOB: 1955/11/23 Today's Date: 10/09/2017    History of Present Illness This is a 61 year old with a past medical history of fibromyalgia, hypertension, and hypothyroidism . Found to have Severe sepsis secondary to pyelonephritis with ureteral obstruction and MRI: Acute/subacute inferior right cerebellum and vermis infarct with associated hemorrhage. Additional scattered punctate foci of acute nonhemorrhagic infarct involving the right PCA territory with 1 lesion involving the right MCA territory along the right middle frontal gyrus. Multiple foci of remote encephalomalacia involving the inferior posterior right temporal lobe, lateral right temporal lobe, and high right parietal lobe. Pt intubated 12/14    PT Comments    Pt intubated since last session and unable to follow commands. Pt spontaneously moving RUE toward head, therapist or rail but not on command. Pt with total assist for all mobility today and max assist for sitting balance. Pt with possible extubation today. Will continue to follow acutely to maximize function, activity tolerance and balance.    VSS on PRVC  Follow Up Recommendations  SNF;Supervision/Assistance - 24 hour     Equipment Recommendations  Wheelchair cushion (measurements PT);Wheelchair (measurements PT);Hospital bed    Recommendations for Other Services       Precautions / Restrictions Precautions Precautions: Fall Precaution Comments: Cortrak, ETT, vent, flexiseal    Mobility  Bed Mobility Overal bed mobility: Needs Assistance Bed Mobility: Supine to Sit;Sit to Supine     Supine to sit: Total assist;+2 for physical assistance Sit to supine: Total assist;+2 for physical assistance   General bed mobility comments: total assist to pivot to and from EOB with assist for trunk and bil LE. Pt with max assist for sitting balance EOB with bil UE on bed and pt using RUE  intermittently to grasp foot rail when placed on rail. tendency for right posterior lean  Transfers                 General transfer comment: unable to perform today  Ambulation/Gait                 Stairs            Wheelchair Mobility    Modified Rankin (Stroke Patients Only) Modified Rankin (Stroke Patients Only) Pre-Morbid Rankin Score: No significant disability Modified Rankin: Severe disability     Balance Overall balance assessment: Needs assistance   Sitting balance-Leahy Scale: Zero                                      Cognition Arousal/Alertness: Awake/alert Behavior During Therapy: Flat affect Overall Cognitive Status: Difficult to assess Area of Impairment: Following commands                   Current Attention Level: Focused           General Comments: pt with eyes open, downward gaze and not following commands throughout session. Pt able to move RUE but not to command      Exercises General Exercises - Lower Extremity Long Arc Quad: PROM;10 reps;Seated;Both    General Comments        Pertinent Vitals/Pain Pain Assessment: (CPOT= 0)    Home Living                      Prior Function  PT Goals (current goals can now be found in the care plan section) Progress towards PT goals: Not progressing toward goals - comment    Frequency    Min 3X/week      PT Plan Discharge plan needs to be updated;Frequency needs to be updated    Co-evaluation              AM-PAC PT "6 Clicks" Daily Activity  Outcome Measure  Difficulty turning over in bed (including adjusting bedclothes, sheets and blankets)?: Unable Difficulty moving from lying on back to sitting on the side of the bed? : Unable Difficulty sitting down on and standing up from a chair with arms (e.g., wheelchair, bedside commode, etc,.)?: Unable Help needed moving to and from a bed to chair (including a wheelchair)?:  Total Help needed walking in hospital room?: Total Help needed climbing 3-5 steps with a railing? : Total 6 Click Score: 6    End of Session   Activity Tolerance: Patient tolerated treatment well Patient left: in bed;with call bell/phone within reach;with bed alarm set Nurse Communication: Mobility status;Need for lift equipment PT Visit Diagnosis: Unsteadiness on feet (R26.81);Other symptoms and signs involving the nervous system (R29.898);Other abnormalities of gait and mobility (R26.89);Muscle weakness (generalized) (M62.81)     Time: [id number] PT Time Calculation (min) (ACUTE ONLY): 18 min  Charges:  $Therapeutic Activity: 8-22 mins                    G Codes:       Delaney MeigsMaija Tabor Mariah Harn, PT [phone]    Reynard Christoffersen B [place] 10/09/2017, 10:00 AM

## 2017-10-09 NOTE — Progress Notes (Signed)
[facility] Infect[facility] Disease  Date of Admission:  10/02/2017     Total days of antibiotics 10  Zoysn Day 6 Vancomycin Day 3          ASSESSMENT/PLAN   MSSA Bacteremia - Continues to have fever with questionable origin. No leukocytosis. Recommend removal of the PICC line as it may be seeded as it was inserted at a time she was positive for MSSA despite new clean culture. Concern for endocarditis as a source of bacteremia. Continues to await her TEE. Recommend completing 7 days course of Zosyn (complete on 12/18) and then restarting Nafcillin. Will require at least 6 weeks of antibiotic therapy.  Right Cerebellar Stroke - Secondary stroke noted on CT scan of the head on 12/14. 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 CT angiogram ordered.   Diarrhea - Likely antibiotic related. C. Diff negative. Additional treatment per primary.   Aspiration Pneumonia - Continues to have fevers. Respiratory culture on 12/14 negative. Continue Zosyn until 12/18.         [place] [facility]icrobial Management Team Staphylococcus aureus bacteremia   Staphylococcus aureus bacteremia (SAB) is associated with a high rate of complications and mortality.  Specific aspects of clinical management are critical to optimizing the outcome of patients with SAB.  Therefore, the [facility] Antimicrobial Management Team [facility]) has initiated an intervention aimed at improving the management of SAB at [facility].  To do so, Infectious Diseases physicians are providing an evidence-based consult for the management of all patients with SAB.     Yes No Comments  Perform follow-up blood cultures (even if the patient is afebrile) to ensure clearance of bacteremia _0  _1    Remove vascular catheter and obtain follow-up blood cultures after the removal of the catheter _2  _3  PICC removal order placed  Perform echocardiography to evaluate for endocarditis  (transthoracic ECHO is 40-50% sensitive, TEE is > 90% sensitive) _4  _5  TTE completed; TEE pending  Consult electrophysiologist to evaluate implanted cardiac device (pacemaker, ICD) _6  _7    Ensure source control _8  _9  Possible endocarditis; May need additional imaging if negative.  Investigate for "metastatic" sites of infection _10  _11  Pending,    Change antibiotic therapy to __________________ _12  _13  Nafcillin for MSSA; Stop Zosyn 12/18  Estimated duration of IV antibiotic therapy:   _14  _15  Pending TEE results but likely 4-6 weeks.    Principal Problem:   Sepsis ([place]) Active Problems:   Stroke (cerebrum) (HCC)   AKI (acute kidney injury) ([place])   Obstructive pyelonephritis   Thrombocytopenia (HCC)   Hypokalemia   Lactic acidosis   Encephalopathy acute   MRSA bacteremia   Aspiration into airway   Fever   .  stroke: mapping our early stages of recovery book   Does not apply Once  . [START ON 10/10/2017] amLODipine  5 mg Per Tube Daily  . aspirin  325 mg Oral Daily  . chlorhexidine gluconate (MEDLINE KIT)  15 mL Mouth Rinse BID  . Chlorhexidine Gluconate Cloth  6 each Topical Daily  . heparin injection (subcutaneous)  5,000 Units Subcutaneous Q8H  . insulin aspart  0-15 Units Subcutaneous Q4H  . ipratropium-albuterol  3 mL Nebulization BID  . lactobacillus  1 g Oral Daily  . levothyroxine  50 mcg Intravenous Daily  . mouth rinse  15 mL Mouth Rinse 10 times per day  . pantoprazole (PROTONIX) IV  40 mg Intravenous Q24H  . pravastatin  20 mg  Oral BH-q7a  . sodium chloride flush  10-40 mL Intracatheter Q12H  . thiamine injection  100 mg Intravenous Daily    SUBJECTIVE:  Continues to have fevers with a max temperature of 101 last evening. No leukocytosis. Was having liquid stool with C. Diff being negative. Currently receiving Vancomycin and Zoysn. CT scan on 12/13 showed new, large area of cytotoxic edema within the posterior right hemisphere involving the posterior parietal and  temporal lobe and the occipital lobe. She required intubation for airway protection over the weekend. Continues to have a PICC located in the right arm. Repeat blood cultures were negative. Repeat urine culture with no growth.  Respiratory cultures from 12/14 were negative. Currently sedated with propofol.   Allergies  Allergen Reactions  . Ativan [Lorazepam] Other (See Comments)    Patient's husband said related to being oversedated not true allergy     Review of Systems: Review of Systems  Unable to perform ROS: Intubated      OBJECTIVE: Vitals:   10/09/17 0800 10/09/17 0900 10/09/17 0956 10/09/17 1000  BP: 133/69 138/69  126/65  Pulse: 83 81 82 81  Resp: (!) 27 (!) 28  (!) 28  Temp: 99.7 F (37.6 C)     TempSrc: Axillary     SpO2: 100% 100% 100% 100%  Weight:      Height:       Body mass index is 24.56 kg/m.  Physical Exam  Constitutional: She is intubated.  Lying in bed; sedated  Cardiovascular: Normal rate, normal heart sounds and intact distal pulses. Exam reveals no gallop and no friction rub.  No murmur heard. Pulmonary/Chest: Effort normal. She is intubated. No respiratory distress. She has no wheezes. She has rales. She exhibits no tenderness.  Abdominal: Soft. Bowel sounds are normal. She exhibits no distension.  Skin: Skin is warm and dry.  PICC site clean without evidence of infection.     Lab Results Lab Results  Component Value Date   WBC 9.6 10/09/2017   HGB 8.4 (L) 10/09/2017   HCT 25.9 (L) 10/09/2017   MCV 91.8 10/09/2017   PLT 173 10/09/2017    Lab Results  Component Value Date   CREATININE 1.05 (H) 10/09/2017   BUN 13 10/09/2017   NA 142 10/09/2017   K 3.7 10/09/2017   CL 119 (H) 10/09/2017   CO2 16 (L) 10/09/2017    Lab Results  Component Value Date   ALT 22 10/08/2017   AST 74 (H) 10/08/2017   ALKPHOS 64 10/08/2017   BILITOT 0.7 10/08/2017     Microbiology: Recent Results (from the past 240 hour(s))  MRSA PCR Screening      Status: None   Collection Time: 10/12/2017  7:52 PM  Result Value Ref Range Status   MRSA by PCR NEGATIVE NEGATIVE Final    Comment:        The GeneXpert MRSA Assay (FDA approved for NASAL specimens only), is one component of a comprehensive MRSA colonization surveillance program. It is not intended to diagnose MRSA infection nor to guide or monitor treatment for MRSA infections.   Culture, blood (x 2)     Status: Abnormal   Collection Time: 09/25/2017  8:35 PM  Result Value Ref Range Status   Specimen Description BLOOD LEFT ANTECUBITAL  Final   Special Requests IN PEDIATRIC BOTTLE Blood Culture adequate volume  Final   Culture  Setup Time   Final    GRAM POSITIVE COCCI IN CLUSTERS IN PEDIATRIC BOTTLE CRITICAL VALUE  NOTED.  VALUE IS CONSISTENT WITH PREVIOUSLY REPORTED AND CALLED VALUE.    Culture (A)  Final    STAPHYLOCOCCUS AUREUS SUSCEPTIBILITIES PERFORMED ON PREVIOUS CULTURE WITHIN THE LAST 5 DAYS.    Report Status 10/04/2017 FINAL  Final  Culture, blood (x 2)     Status: Abnormal   Collection Time: 10/23/2017  8:40 PM  Result Value Ref Range Status   Specimen Description BLOOD LEFT ANTECUBITAL  Final   Special Requests IN PEDIATRIC BOTTLE Blood Culture adequate volume  Final   Culture  Setup Time   Final    GRAM POSITIVE COCCI IN CLUSTERS IN PEDIATRIC BOTTLE CRITICAL RESULT CALLED TO, READ BACK BY AND VERIFIED WITH: B MANCHERIL,PHARMD AT 0825 10/02/17 BY L BENFIELD    Culture STAPHYLOCOCCUS AUREUS (A)  Final   Report Status 10/04/2017 FINAL  Final   Organism ID, Bacteria STAPHYLOCOCCUS AUREUS  Final      Susceptibility   Staphylococcus aureus - MIC*    CIPROFLOXACIN <=0.5 SENSITIVE Sensitive     ERYTHROMYCIN >=8 RESISTANT Resistant     GENTAMICIN <=0.5 SENSITIVE Sensitive     OXACILLIN <=0.25 SENSITIVE Sensitive     TETRACYCLINE <=1 SENSITIVE Sensitive     VANCOMYCIN <=0.5 SENSITIVE Sensitive     TRIMETH/SULFA <=10 SENSITIVE Sensitive     CLINDAMYCIN RESISTANT  Resistant     RIFAMPIN <=0.5 SENSITIVE Sensitive     Inducible Clindamycin POSITIVE Resistant     * STAPHYLOCOCCUS AUREUS  Blood Culture ID Panel (Reflexed)     Status: Abnormal   Collection Time: 09/27/2017  8:40 PM  Result Value Ref Range Status   Enterococcus species NOT DETECTED NOT DETECTED Final   Listeria monocytogenes NOT DETECTED NOT DETECTED Final   Staphylococcus species DETECTED (A) NOT DETECTED Final    Comment: CRITICAL RESULT CALLED TO, READ BACK BY AND VERIFIED WITH: B MANCHERIL,PHARMD AT 0825 10/02/17 BY L BENFIELD    Staphylococcus aureus DETECTED (A) NOT DETECTED Final    Comment: Methicillin (oxacillin) susceptible Staphylococcus aureus (MSSA). Preferred therapy is anti staphylococcal beta lactam antibiotic (Cefazolin or Nafcillin), unless clinically contraindicated. CRITICAL RESULT CALLED TO, READ BACK BY AND VERIFIED WITH: B MANCHERIL,PHARMD AT 0825 10/02/17 BY L BENFIELD    Methicillin resistance NOT DETECTED NOT DETECTED Final   Streptococcus species NOT DETECTED NOT DETECTED Final   Streptococcus agalactiae NOT DETECTED NOT DETECTED Final   Streptococcus pneumoniae NOT DETECTED NOT DETECTED Final   Streptococcus pyogenes NOT DETECTED NOT DETECTED Final   Acinetobacter baumannii NOT DETECTED NOT DETECTED Final   Enterobacteriaceae species NOT DETECTED NOT DETECTED Final   Enterobacter cloacae complex NOT DETECTED NOT DETECTED Final   Escherichia coli NOT DETECTED NOT DETECTED Final   Klebsiella oxytoca NOT DETECTED NOT DETECTED Final   Klebsiella pneumoniae NOT DETECTED NOT DETECTED Final   Proteus species NOT DETECTED NOT DETECTED Final   Serratia marcescens NOT DETECTED NOT DETECTED Final   Haemophilus influenzae NOT DETECTED NOT DETECTED Final   Neisseria meningitidis NOT DETECTED NOT DETECTED Final   Pseudomonas aeruginosa NOT DETECTED NOT DETECTED Final   Candida albicans NOT DETECTED NOT DETECTED Final   Candida glabrata NOT DETECTED NOT DETECTED Final     Candida krusei NOT DETECTED NOT DETECTED Final   Candida parapsilosis NOT DETECTED NOT DETECTED Final   Candida tropicalis NOT DETECTED NOT DETECTED Final  Culture, blood (routine x 2)     Status: None   Collection Time: 10/03/17  9:40 AM  Result Value Ref Range Status   Specimen  Description BLOOD LEFT HAND  Final   Special Requests IN PEDIATRIC BOTTLE Blood Culture adequate volume  Final   Culture NO GROWTH 5 DAYS  Final   Report Status 10/08/2017 FINAL  Final  Culture, blood (routine x 2)     Status: None   Collection Time: 10/03/17  9:50 AM  Result Value Ref Range Status   Specimen Description BLOOD LEFT HAND  Final   Special Requests IN PEDIATRIC BOTTLE Blood Culture adequate volume  Final   Culture NO GROWTH 5 DAYS  Final   Report Status 10/08/2017 FINAL  Final  C difficile quick scan w PCR reflex     Status: None   Collection Time: 10/06/17 10:05 AM  Result Value Ref Range Status   C Diff antigen NEGATIVE NEGATIVE Final   C Diff toxin NEGATIVE NEGATIVE Final   C Diff interpretation No C. difficile detected.  Final  Culture, Urine     Status: None   Collection Time: 10/06/17 11:04 AM  Result Value Ref Range Status   Specimen Description URINE, CATHETERIZED  Final   Special Requests Normal  Final   Culture NO GROWTH  Final   Report Status 10/07/2017 FINAL  Final  Culture, respiratory (NON-Expectorated)     Status: None   Collection Time: 10/06/17  2:31 PM  Result Value Ref Range Status   Specimen Description TRACHEAL ASPIRATE  Final   Special Requests Normal  Final   Gram Stain   Final    MODERATE WBC PRESENT, PREDOMINANTLY MONONUCLEAR NO ORGANISMS SEEN    Culture NO GROWTH 2 DAYS  Final   Report Status 10/09/2017 FINAL  Final     Terri Piedra, NP [facility] [phone] Pager  10/09/2017  10:30 AM

## 2017-10-09 NOTE — Progress Notes (Signed)
SLP Cancellation Note  Patient Details Name: Ashlee Snow MRN: [id number] DOB: September 05, 1956   Cancelled treatment:       Reason Eval/Treat Not Completed: Medical issues which prohibited therapy. Pt now intubated. Please reorder when pt medically ready   [name], [name] 10/09/2017, 8:12 AM

## 2017-10-09 NOTE — Progress Notes (Addendum)
CSW spoke with supervisor and received clarification on Power of Attorney and Chief Financial Officer. Pt is not eligible for POA at this time because of being unable to sign the paperwork and make decisions. CSW spoke with Mr. Turvey, pt's husband, and explained that he would need to pursue legal guardianship. Mr. Raphael has met with a lawyer and the lawyer is requesting a letter from the doctor outlining the pt's condition. CSW will speak with the doctor to see if they feel comfortable providing that letter.   Plan: CSW will speak with pt's doctor and keep Mr. Boehning updated.  6:15 PM CSW spoke with pt's attending physician. Physician asked CSW to put together letter (stating medical condition). CSW will put together letter for attending to sign tomorrow. Letter from the physician outlining pt's health condition is being requested by the pt's husband. Pt's husband plans to provide letter to an attorney to fill for legal guardianship. CSW called pt's husband and informed pt husband that CSW and physician will be working on the letter (stating medical condition) and will hopefully have it ready for him tomorrow evening.    Wendelyn Breslow, [facility]  [phone]

## 2017-10-10 ENCOUNTER — Other Ambulatory Visit (HOSPITAL_COMMUNITY): Payer: BLUE CROSS/BLUE SHIELD

## 2017-10-10 DIAGNOSIS — Z9911 Dependence on respirator [ventilator] status: Secondary | ICD-10-CM

## 2017-10-10 DIAGNOSIS — Z888 Allergy status to other drugs, medicaments and biological substances status: Secondary | ICD-10-CM

## 2017-10-10 DIAGNOSIS — A4101 Sepsis due to Methicillin susceptible Staphylococcus aureus: Secondary | ICD-10-CM

## 2017-10-10 DIAGNOSIS — E781 Pure hyperglyceridemia: Secondary | ICD-10-CM

## 2017-10-10 DIAGNOSIS — R509 Fever, unspecified: Secondary | ICD-10-CM

## 2017-10-10 LAB — CBC
HCT: 26.5 % — ABNORMAL LOW (ref 36.0–46.0)
Hemoglobin: 8.4 g/dL — ABNORMAL LOW (ref 12.0–15.0)
MCH: 29.6 pg (ref 26.0–34.0)
MCHC: 31.7 g/dL (ref 30.0–36.0)
MCV: 93.3 fL (ref 78.0–100.0)
PLATELETS: 182 10*3/uL (ref 150–400)
RBC: 2.84 MIL/uL — AB (ref 3.87–5.11)
RDW: 17.1 % — AB (ref 11.5–15.5)
WBC: 9.4 10*3/uL (ref 4.0–10.5)

## 2017-10-10 LAB — BASIC METABOLIC PANEL
Anion gap: 10 (ref 5–15)
BUN: 20 mg/dL (ref 6–20)
CALCIUM: 7.9 mg/dL — AB (ref 8.9–10.3)
CO2: 13 mmol/L — ABNORMAL LOW (ref 22–32)
CREATININE: 1.13 mg/dL — AB (ref 0.44–1.00)
Chloride: 118 mmol/L — ABNORMAL HIGH (ref 101–111)
GFR calc Af Amer: >60 mL/min (ref >60)
GFR, EST NON AFRICAN AMERICAN: 52 mL/min — AB (ref >60)
GLUCOSE: 196 mg/dL — AB (ref 65–99)
POTASSIUM: 3.7 mmol/L (ref 3.5–5.1)
SODIUM: 141 mmol/L (ref 135–145)

## 2017-10-10 LAB — GLUCOSE, CAPILLARY
GLUCOSE-CAPILLARY: 140 mg/dL — AB (ref 65–99)
GLUCOSE-CAPILLARY: 141 mg/dL — AB (ref 65–99)
GLUCOSE-CAPILLARY: 154 mg/dL — AB (ref 65–99)
Glucose-Capillary: 176 mg/dL — ABNORMAL HIGH (ref 65–99)
Glucose-Capillary: 140 mg/dL — ABNORMAL HIGH (ref 65–99)
Glucose-Capillary: 196 mg/dL — ABNORMAL HIGH (ref 65–99)

## 2017-10-10 LAB — TRIGLYCERIDES: TRIGLYCERIDES: 275 mg/dL — AB (ref <150)

## 2017-10-10 MED ORDER — POTASSIUM CHLORIDE 20 MEQ PO PACK
40.0000 meq | PACK | Freq: Once | ORAL | Status: AC
  Administered 2017-10-10: 40 meq via ORAL
  Filled 2017-10-10: qty 2

## 2017-10-10 MED ORDER — FAMOTIDINE 40 MG/5ML PO SUSR
20.0000 mg | Freq: Two times a day (BID) | ORAL | Status: DC
  Administered 2017-10-10 – 2017-10-14 (×9): 20 mg via ORAL
  Filled 2017-10-10 (×10): qty 2.5

## 2017-10-10 MED ORDER — NAFCILLIN SODIUM 2 G IJ SOLR
2.0000 g | INTRAVENOUS | Status: DC
  Administered 2017-10-11 – 2017-10-22 (×67): 2 g via INTRAVENOUS
  Filled 2017-10-10 (×78): qty 2000

## 2017-10-10 MED ORDER — FENTANYL 2500MCG IN NS 250ML (10MCG/ML) PREMIX INFUSION
25.0000 ug/h | INTRAVENOUS | Status: DC
  Administered 2017-10-10: 50 ug/h via INTRAVENOUS
  Filled 2017-10-10: qty 250

## 2017-10-10 MED ORDER — FENTANYL CITRATE (PF) 100 MCG/2ML IJ SOLN: 50.0000 ug | Freq: Once | INTRAMUSCULAR | Status: DC

## 2017-10-10 MED ORDER — DEXMEDETOMIDINE HCL IN NACL 200 MCG/50ML IV SOLN
0.4000 ug/kg/h | INTRAVENOUS | Status: DC
  Administered 2017-10-10: 0.4 ug/kg/h via INTRAVENOUS
  Administered 2017-10-10: 0.6 ug/kg/h via INTRAVENOUS
  Filled 2017-10-10 (×2): qty 50

## 2017-10-10 NOTE — Progress Notes (Addendum)
CSW sent page to attending physician to review legal guardian paperwork for physician to sign.   CSW met with physician, Dr. Pati Gallo. Physician signed letter for legal guardianship. CSW will provide letter for legal guardianship to husband when he arrives this evening.   Wendelyn Breslow, [facility]  [phone]

## 2017-10-10 NOTE — Progress Notes (Signed)
Spoke with MD regarding hypotension from sedation and remaining tachypnic, orders for a fentanyl drip placed  .

## 2017-10-10 NOTE — Progress Notes (Signed)
Rehab admissions - Noted PT now recommending SNF.  Patient is intubated at this time.  I will sign off for acute inpatient rehab admission.  If condition improves dramatically and patient participates fully with therapies, then can re-consult for inpatient rehab.  Call me for questions.  #[phone]

## 2017-10-10 NOTE — Progress Notes (Signed)
STROKE TEAM PROGRESS NOTE   SUBJECTIVE (INTERVAL HISTORY) Pt RN at bedside. Pt is more awake alert and eyes open, following most of the simple commands, still has right gaze preference, on weaning trial.    OBJECTIVE Lab Results: CBC:  Recent Labs  Lab 10/08/17 0452 10/09/17 0500 10/10/17 0359  WBC 9.8 9.6 9.4  HGB 8.8* 8.4* 8.4*  HCT 28.3* 25.9* 26.5*  MCV 93.4 91.8 93.3  PLT 145* 173 182   BMP: Recent Labs  Lab 10/04/17 1520 01-Nov-2017 0523 11/01/2017 1709 10/06/17 0621 10/06/17 2020 10/07/17 0500 10/08/17 0452 10/09/17 0500 10/10/17 0359  NA  --  168*  --  157* 158* 155* 149* 142 141  K  --  3.2*  --  5.2* 3.1* 3.1* 3.8 3.7 3.7  CL  --  >130*  --  >130* >130* 115* 124* 119* 118*  CO2  --  19*  --  18* 19* 16* 16* 16* 13*  GLUCOSE  --  128*  --  563* 118* 233* 229* 147* 196*  BUN  --  34*  --  23* 20 18 18 13 20   CREATININE  --  2.01*  --  1.43* 1.36* 1.73* 1.23* 1.05* 1.13*  CALCIUM  --  7.9*  --  7.0* 7.5* 6.7* 7.7* 7.8* 7.9*  MG 2.2 2.2 2.2 2.1 2.1  --   --   --   --   PHOS 3.5 4.0 2.8  --   --   --   --   --   --    Liver Function Tests:  Recent Labs  Lab 10/08/17 0452  AST 74*  ALT 22  ALKPHOS 64  BILITOT 0.7  PROT 5.3*  ALBUMIN 2.2*   No results for input(s): AMMONIA in the last 168 hours. Cardiac Enzymes:  No results for input(s): CKTOTAL, CKMB, CKMBINDEX, TROPONINI in the last 168 hours. Coagulation Studies:  No results for input(s): APTT, INR in the last 72 hours. PHYSICAL EXAM Temp:  [98.3 F (36.8 C)-102.4 F (39.1 C)] 98.3 F (36.8 C) (12/18 1200) Pulse Rate:  [79-109] 97 (12/18 1121) Resp:  [25-33] 32 (12/18 1121) BP: (76-145)/(38-73) 124/63 (12/18 1121) SpO2:  [98 %-100 %] 100 % (12/18 1124) FiO2 (%):  [30 %] 30 % (12/18 1121)  General - Well nourished, well developed, intubated on precedex.  Ophthalmologic - Fundi not visualized due to noncooperation.  Cardiovascular - Regular rate and rhythm.  Neuro - intubated on precedex, eyes  spontaneously open, able to follow most simple commands. PERRL, not blinking to visual threat bilaterally, right eye gaze preference, not able to cross midline, positive corneal, gag and cough. RUE and RLE follows commands with against gravity movement, LUE 0/5 and LLE 2/5 withdraw on pain. DTR 1+ and positive babinski bilaterally. Sensation, coordination and gait not tested.   IMAGING: I have personally reviewed the radiological images below and agree with the radiology interpretations.  Ct Head Wo Contrast 10/01/2017 IMPRESSION: No significant change since yesterday. Acute infarction affecting the right cerebellum with swelling. Mass-effect upon the fourth ventricle but no change in size the lateral or third ventricles at this time. No hemorrhage. 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 Electronically Signed   By: Paulina Fusi M.D.   On: 10/01/2017 07:45   MRI/MRA Brain Wo Contrast 10/01/2017 IMPRESSION: 1. Acute/subacute inferior right cerebellum and vermis infarct with associated hemorrhage. 2. Additional scattered punctate foci of acute nonhemorrhagic infarct involving the right PCA territory with 1 lesion involving the right MCA  territory along the right middle frontal gyrus. 3. Multiple foci of remote encephalomalacia involving the inferior posterior right temporal lobe, lateral right temporal lobe, and high right parietal lobe. 4. 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 The right A1 and likely M1 segments fill via a patent anterior communicating artery. 5. Fenestration of the vertebrobasilar junction without significant basilar artery stenosis. 6. Marked attenuation of PCA branch vessels, worse on the right. Moderate proximal right PCA stenosis. 7. Fetal type left posterior cerebral artery.   Echocardiogram:  - Left ventricle: The cavity size was normal. Wall thickness was   increased in a pattern of mild LVH. Systolic function  was normal.   The estimated ejection fraction was in the range of 60% to 65%.   Wall motion was normal; there were no regional wall motion   abnormalities. Doppler parameters are consistent with abnormal   left ventricular relaxation (grade 1 diastolic dysfunction). - Mitral valve: There was mild regurgitation.  B/L Carotid U/S:                                                Right ECA and ICA waveforms are very similar. Unable to interrogate stenosis.  40-59% left ICA stenosis. Bilateral vertebral artery flow is antegrade.   CT Head: 10/02/17                                                   1. No significant change in the appearance of acute to subacute, hemorrhagic right inferior cerebellar infarct with slight positive mass effect on the adjacent fourth ventricle. No dilatation the third or lateral ventricles. 2. Chronic right parietal, posteromedial temporal and occipital lobe infarcts as well as right periventricular white matter tract lacunar infarct.   EEG 10/02/17:   This normal EEG is recorded in the waking state     Ct Head Wo Contrast 10/06/2017 IMPRESSION: 1. New, large area of cytotoxic edema within the posterior right hemisphere, involving the posterior parietal and temporal lobes and the occipital lobe. This includes portions of the posterior right MCA territory and the PCA territory. 2. Unchanged cytotoxic edema of the right cerebellar hemisphere with mass effect on the fourth ventricle. 3. No herniation, hydrocephalus or hemorrhage.   Ct Angio Head and neck W Or Wo Contrast 10/09/2017 IMPRESSION: 1. Occluded right cavernous ICA and occluded or severely stenotic right M1 segment. Hypertrophic lenticulostriate on the right with prompt filling of right MCA branches, a chronic appearance. The right cervical ICA is diffusely diminutive, presumably secondary. 2. Right P2 occlusion. 3. Moderate to advanced left paraclinoid ICA segment narrowing. 4. Poor visualization of the right  V1 segment due to artifact and/or atheromatous narrowing. 5. Known acute infarcts in the right posterior cerebrum and right cerebellum. No hydrocephalus, acute hemorrhage, or visible progression.                ASSESSMENT: Ms. Ashlee Snow is a 61 y.o. female with PMH of prior hypertension, coronary artery disease, hypothyroidism, chronic pain, depression, smoker and peripheral arterial disease status post aorto-iliac bypass in June admitted for fever, confusion, right flank pain. Found to have right pyelonephritis and hydronephrosis. Also MRI showed right cerebellar large infarcts  with right MCA scattered infarcts. B Cx showed MSSA. On Abx. However, repeat CT head on 10/06/17 showed new large right MCA infarcts. Concerning for endocarditis.   Acute, large Right cerebellar stroke with petechial hemorrhagic conversion as well as acute large right MCA and PCA infarct - concerning for endocarditis. Pt also found to have High-grade stenosis or occlusion of the cavernous right internal carotid artery.    Resultant - intubated on sedation, left hemiparesis  CT head 10/02/17 right cerebellar large infarct  MRI head 10/01/17 right cerebellar large infarct and right MCA scattered small infarcts. Old right PCA, right caudate, right parietal high convexity  MRA head - right ICA cavernous high grade stenosis vs. Occlusion, b/l M1 stenosis, right PCA distal stenosis  CT head 10/06/17 - new large right MCA and PCA infarct with cytotoxic edema  CTA head and neck - right ICA occlusion, right M1 high grade stenosis vs. Occlusion, right P2 occlusion and right V1 high grade stenosis, no mycotic aneurysms  CUS - waveform consistent with right ICA distal stenosis vs. Occlusion  TTE EF 60-65%, no vegetation  Consider TEE to rule out endocarditis once pt more medically stable   Heparin subq for DVT prophylaxis  NPO for now  No antithrombotics PTA, now on ASA 325mg .  Therapy recommendation -  pending  Deposition - pending  Sepsis with pyelonephritis and bacteremia - ? Endocarditis  Intermittent high grade fever with low BP  Admitted for pyelonephritis with hydronephrosis confirmed on CT  B Cx positive for MSSA, repeat culture neg  Urine culture neg  Sputum culture neg  On nafcillin  Due to recurrent stroke, concerning for endocarditis  Will consider TEE once pt more medically stable  Respiratory failure  Intubated 10/06/17  CCM on board  On low dose sedation  On weaning trial  Extubate once able  AKI  Cre 1.64->2.01->1.36->1.73->1.23->1.05->1.13  On IVF and TF  CCM on board  Continue to monitor  Intracranial atherosclerosis and stenosis:  MRA showed right ICA distal occlusion, b/l M1 stenosis, right PCA distal stenosis  CTA head and neck - right ICA occlusion, right M1 high grade stenosis vs. Occlusion, right P2 occlusion and right V1 high grade stenosis,  CUS consistent with right ICA distal stenosis or occlusion  On ASA now  May consider DAPT on discharge if pt condition improves  Will consider cerebral angiogram to further assess right ICA and MCA occlusion/stenosis  Avoid hypotension  HTN / hypotension  On the low side  D/c amlodipine  Resume TF and continue IVF  BP goal 130-150 due to right ICA occlusion  Avoid hypotension  HYPERLIPIDEMIA  Home Meds:  Pravachol 20 mg  LDL 46,  goal < 70  AST 183->74  ALT 72->22  Resumed pravachol 20  TOBACCO ABUSE  Current smoker  Smoking cessation counseling when applicable  Nicotine patch PRN  Other Active Problems:  Hypernatremia - resolved  Hyperglycemia - improved  Hospital day # 10  This patient is critically ill due to recurrent stroke, sepsis, fever, bacteremia, AKI, intubated and at significant risk of neurological worsening, death form recurrent stroke, hemorrhagic conversion, septic shock, seizure, endocarditis, kidney failure. This patient's care requires  constant monitoring of vital signs, hemodynamics, respiratory and cardiac monitoring, review of multiple databases, neurological assessment, discussion with family, other specialists and medical decision making of high complexity. I had long discussion with husband at bedside, updated pt current condition, treatment plan and potential prognosis. He expressed understanding and appreciation. I discussed also with Dr.  Skains over the phone regarding TEE. I spent 45 minutes of neurocritical care time in the care of this patient.  Marvel PlanJindong Meili Kleckley, MD PhD Stroke Neurology 10/10/2017 12:53 PM   To contact Stroke Continuity provider, please refer to [email or web address]. After hours, contact General Neurology

## 2017-10-10 NOTE — Progress Notes (Signed)
CSW provided pt's husband, Henreitta Leberimothy Sachdeva, with signed letter stating pt's condition for legal guardianship.   Montine CircleKelsy Yasuko Lapage, Silverio LayLCSWA [facility]  [phone]

## 2017-10-10 NOTE — Progress Notes (Signed)
Paged MD regarding sedation. Pt on propofol but triglycerides elevated and needing to increase propofol. Orders placed for precedex

## 2017-10-10 NOTE — Progress Notes (Signed)
[facility]  Date of Admission:  09/29/2017             ASSESSMENT/PLAN  MSSA Bacteremia - Continues to have fevers. PICC line has been removed. Awaiting TEE. End date to Zosyn placed and will restart Nafcillin. Repeat blood cultures today now that PICC is out with continued fevers. Will replace PICC once blood cultures and fevers are cleared.   Diarrhea - Continues to have loose stools with Flexiseal in place. C. Diff negative. Likely antibiotic related. Continue to monitor.  Cerebellar Stroke -  More alert today with right side gaze and following brief commands. Continues to be intubated for airway protection. Risk factor management and work up per Neurology and [place].    Principal Problem:   MSSA bacteremia Active Problems:   Sepsis ([place])   Stroke (cerebrum) (H[facility])   AKI (acute kidney injury) ([place])   Obstructive pyelonephritis   Thrombocytopenia (H[facility])   Hypokalemia   Lactic acidosis   Encephalopathy acute   Aspiration into airway   Fever   Dyspnea   .  stroke: mapping our early stages of recovery book   Does not apply Once  . aspirin  325 mg Oral Daily  . chlorhexidine gluconate (MEDLINE KIT)  15 mL Mouth Rinse BID  . famotidine  20 mg Oral BID  . fentaNYL (SUBLIMAZE) injection  50 mcg Intravenous Once  . heparin injection (subcutaneous)  5,000 Units Subcutaneous Q8H  . insulin aspart  0-15 Units Subcutaneous Q4H  . ipratropium-albuterol  3 mL Nebulization BID  . lactobacillus  1 g Oral Daily  . levothyroxine  50 mcg Intravenous Daily  . mouth rinse  15 mL Mouth Rinse 10 times per day  . pravastatin  20 mg Oral [id number]  . thiamine injection  100 mg Intravenous Daily    SUBJECTIVE:  Febrile overnight with T max of 102.4. Remains intubated. Sedation changed from Propofol to Precedex secondary to elevated triglycerides. Sedation currently off. Continues to have liquid stools with Flexiseal in place. PICC line removed 12/17 and  stopped vancomycin.   Allergies  Allergen Reactions  . Ativan [Lorazepam] Other (See Comments)    Patient's husband said related to being oversedated not true allergy     Review of Systems: Review of Systems  Unable to perform ROS: Intubated      OBJECTIVE: Vitals:   10/10/17 0717 10/10/17 0800 10/10/17 1121 10/10/17 1124  BP: (!) 100/53  124/63   Pulse: 83  97   Resp: (!) 27  (!) 32   Temp:  98.6 F (37 C)    TempSrc:  Axillary    SpO2: 98%  100% 100%  Weight:      Height:       Body mass index is 24.56 kg/m.  Physical Exam  Constitutional:  Lying in bed - eyes open with right sided gaze.   Cardiovascular: Regular rhythm and intact distal pulses. Tachycardia present. Exam reveals no gallop and no friction rub.  No murmur heard. Pulmonary/Chest: Effort normal and breath sounds normal. No respiratory distress. She has no wheezes. She has no rales. She exhibits no tenderness.  Neurological:  Eyes open. Pupils are equal and reactive. Able to follow commands on the right side. Right sided gaze.   Skin: Skin is warm and dry.    Lab Results Lab Results  Component Value Date   WBC 9.4 10/10/2017   HGB 8.4 (L) 10/10/2017   HCT 26.5 (L) 10/10/2017   MCV  93.3 10/10/2017   PLT 182 10/10/2017    Lab Results  Component Value Date   CREATININE 1.13 (H) 10/10/2017   BUN 20 10/10/2017   NA 141 10/10/2017   K 3.7 10/10/2017   CL 118 (H) 10/10/2017   CO2 13 (L) 10/10/2017    Lab Results  Component Value Date   ALT 22 10/08/2017   AST 74 (H) 10/08/2017   ALKPHOS 64 10/08/2017   BILITOT 0.7 10/08/2017     Microbiology: Recent Results (from the past 240 hour(s))  MRSA PCR Screening     Status: None   Collection Time: 09/25/2017  7:52 PM  Result Value Ref Range Status   MRSA by PCR NEGATIVE NEGATIVE Final    Comment:        The GeneXpert MRSA Assay (FDA approved for NASAL specimens only), is one component of a comprehensive MRSA colonization surveillance  program. It is not intended to diagnose MRSA infection nor to guide or monitor treatment for MRSA infections.   Culture, blood (x 2)     Status: Abnormal   Collection Time: 10/07/2017  8:35 PM  Result Value Ref Range Status   Specimen Description BLOOD LEFT ANTECUBITAL  Final   Special Requests IN PEDIATRIC BOTTLE Blood Culture adequate volume  Final   Culture  Setup Time   Final    GRAM POSITIVE COCCI IN CLUSTERS IN PEDIATRIC BOTTLE CRITICAL VALUE NOTED.  VALUE IS CONSISTENT WITH PREVIOUSLY REPORTED AND CALLED VALUE.    Culture (A)  Final    STAPHYLOCOCCUS AUREUS SUSCEPTIBILITIES PERFORMED ON PREVIOUS CULTURE WITHIN THE LAST 5 DAYS.    Report Status 10/04/2017 FINAL  Final  Culture, blood (x 2)     Status: Abnormal   Collection Time: 09/25/2017  8:40 PM  Result Value Ref Range Status   Specimen Description BLOOD LEFT ANTECUBITAL  Final   Special Requests IN PEDIATRIC BOTTLE Blood Culture adequate volume  Final   Culture  Setup Time   Final    GRAM POSITIVE COCCI IN CLUSTERS IN PEDIATRIC BOTTLE CRITICAL RESULT CALLED TO, READ BACK BY AND VERIFIED WITH: B MANCHERIL,PHARMD AT 0825 10/02/17 BY L BENFIELD    Culture STAPHYLOCOCCUS AUREUS (A)  Final   Report Status 10/04/2017 FINAL  Final   Organism ID, Bacteria STAPHYLOCOCCUS AUREUS  Final      Susceptibility   Staphylococcus aureus - MIC*    CIPROFLOXACIN <=0.5 SENSITIVE Sensitive     ERYTHROMYCIN >=8 RESISTANT Resistant     GENTAMICIN <=0.5 SENSITIVE Sensitive     OXACILLIN <=0.25 SENSITIVE Sensitive     TETRACYCLINE <=1 SENSITIVE Sensitive     VANCOMYCIN <=0.5 SENSITIVE Sensitive     TRIMETH/SULFA <=10 SENSITIVE Sensitive     CLINDAMYCIN RESISTANT Resistant     RIFAMPIN <=0.5 SENSITIVE Sensitive     Inducible Clindamycin POSITIVE Resistant     * STAPHYLOCOCCUS AUREUS  Blood Culture ID Panel (Reflexed)     Status: Abnormal   Collection Time: 10/19/2017  8:40 PM  Result Value Ref Range Status   Enterococcus species NOT  DETECTED NOT DETECTED Final   Listeria monocytogenes NOT DETECTED NOT DETECTED Final   Staphylococcus species DETECTED (A) NOT DETECTED Final    Comment: CRITICAL RESULT CALLED TO, READ BACK BY AND VERIFIED WITH: B MANCHERIL,PHARMD AT 0825 10/02/17 BY L BENFIELD    Staphylococcus aureus DETECTED (A) NOT DETECTED Final    Comment: Methicillin (oxacillin) susceptible Staphylococcus aureus (MSSA). Preferred therapy is anti staphylococcal beta lactam antibiotic (Cefazolin or Nafcillin), unless  clinically contraindicated. CRITICAL RESULT CALLED TO, READ BACK BY AND VERIFIED WITH: B MANCHERIL,PHARMD AT 0825 10/02/17 BY L BENFIELD    Methicillin resistance NOT DETECTED NOT DETECTED Final   Streptococcus species NOT DETECTED NOT DETECTED Final   Streptococcus agalactiae NOT DETECTED NOT DETECTED Final   Streptococcus pneumoniae NOT DETECTED NOT DETECTED Final   Streptococcus pyogenes NOT DETECTED NOT DETECTED Final   Acinetobacter baumannii NOT DETECTED NOT DETECTED Final   Enterobacteriaceae species NOT DETECTED NOT DETECTED Final   Enterobacter cloacae complex NOT DETECTED NOT DETECTED Final   Escherichia coli NOT DETECTED NOT DETECTED Final   Klebsiella oxytoca NOT DETECTED NOT DETECTED Final   Klebsiella pneumoniae NOT DETECTED NOT DETECTED Final   Proteus species NOT DETECTED NOT DETECTED Final   Serratia marcescens NOT DETECTED NOT DETECTED Final   Haemophilus influenzae NOT DETECTED NOT DETECTED Final   Neisseria meningitidis NOT DETECTED NOT DETECTED Final   Pseudomonas aeruginosa NOT DETECTED NOT DETECTED Final   Candida albicans NOT DETECTED NOT DETECTED Final   Candida glabrata NOT DETECTED NOT DETECTED Final   Candida krusei NOT DETECTED NOT DETECTED Final   Candida parapsilosis NOT DETECTED NOT DETECTED Final   Candida tropicalis NOT DETECTED NOT DETECTED Final  Culture, blood (routine x 2)     Status: None   Collection Time: 10/03/17  9:40 AM  Result Value Ref Range Status    Specimen Description BLOOD LEFT HAND  Final   Special Requests IN PEDIATRIC BOTTLE Blood Culture adequate volume  Final   Culture NO GROWTH 5 DAYS  Final   Report Status 10/08/2017 FINAL  Final  Culture, blood (routine x 2)     Status: None   Collection Time: 10/03/17  9:50 AM  Result Value Ref Range Status   Specimen Description BLOOD LEFT HAND  Final   Special Requests IN PEDIATRIC BOTTLE Blood Culture adequate volume  Final   Culture NO GROWTH 5 DAYS  Final   Report Status 10/08/2017 FINAL  Final  C difficile quick scan w PCR reflex     Status: None   Collection Time: 10/06/17 10:05 AM  Result Value Ref Range Status   C Diff antigen NEGATIVE NEGATIVE Final   C Diff toxin NEGATIVE NEGATIVE Final   C Diff interpretation No C. difficile detected.  Final  Culture, Urine     Status: None   Collection Time: 10/06/17 11:04 AM  Result Value Ref Range Status   Specimen Description URINE, CATHETERIZED  Final   Special Requests Normal  Final   Culture NO GROWTH  Final   Report Status 10/07/2017 FINAL  Final  Culture, respiratory (NON-Expectorated)     Status: None   Collection Time: 10/06/17  2:31 PM  Result Value Ref Range Status   Specimen Description TRACHEAL ASPIRATE  Final   Special Requests Normal  Final   Gram Stain   Final    MODERATE WBC PRESENT, PREDOMINANTLY MONONUCLEAR NO ORGANISMS SEEN    Culture NO GROWTH 2 DAYS  Final   Report Status 10/09/2017 FINAL  Final     Terri Piedra, NP [facility] [phone] Pager  10/10/2017  11:47 AM

## 2017-10-10 NOTE — Progress Notes (Signed)
eLink Physician-Brief Progress Note Patient Name: Ashlee ParaDebra A Maimone DOB: 1956-01-01 MRN: [id number]   Date of Service  10/10/2017  HPI/Events of Note  Triglyceride level = 403. Patient is on a Propofol IV infusion for sedation.   eICU Interventions  Will order: 1. D/C  Propofol IV infusion.  2. Precedex IV infusion. Titrate to RASS = 0 to -1.      Intervention Category Major Interventions: Other:  Sommer,Steven Dennard Nipugene 10/10/2017, 2:01 AM

## 2017-10-10 NOTE — Progress Notes (Addendum)
CRITICAL CARE PROGRESS NOTE     Mechanical Ventilation day #5 post stroke day10  Name:Ashlee Snow XIP:[id number] DOB:November 14, 1955  ADMISSION DATE:10/06/2017    Patient Summary:     CHIEF COMPLAINT:Fever and nausea HISTORY OF PRESENT ILLNESS: This is a 61 year old with a past medical history of fibromyalgia, hypertension, and hypothyroidism who for several days prior to admission had apparently been suffering from nausea and vomiting. She subsequently had fevers and was evaluated at an outside hospital where she was found to have left hydronephrosis. A ureteral stent was placed and she was treated for urosepsis with antibiotics and generous fluids. She has grown MSSA from her blood.  [facility] urine sample &culture was contaminated,so we have no culture of her urine on presentation..CT scan of the head has shown a cerebellar infarction.  Her oxygen requirements increased substantially on 12/10 and Levaquin was added to nafcillin to cover for presumed aspiration. Zosyn was subsequently substituted for both. Her oxygen requirements have improved, but her mental status has not. Today she is febrile. She is having liquid stool.   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  PAST SURGICAL HISTORY: [name] a past surgical history that includes Cholecystectomy (01/22/1997); Abdominal hysterectomy; Ovarian cyst surgery; aortogram w/ stenting (2007, 2011, Aug. 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    Subjective:  on CPAP trial this am Spiked  Fever 102 overnight Diarrhea persists   TEE  postponed to today Propofol due to high TGL/ on precedex. BP soft- MAP 65 Good urine outpt         Scheduled Meds: .  stroke: mapping our early stages of recovery book   Does not apply Once  . aspirin  325 mg Oral Daily  . chlorhexidine gluconate (MEDLINE KIT)  15 mL Mouth Rinse BID  . famotidine  20 mg Oral BID  . fentaNYL (SUBLIMAZE) injection  50 mcg Intravenous Once  . heparin injection (subcutaneous)  5,000 Units Subcutaneous Q8H  . insulin aspart  0-15 Units Subcutaneous Q4H  . ipratropium-albuterol  3 mL Nebulization BID  . lactobacillus  1 g Oral Daily  . levothyroxine  50 mcg Intravenous Daily  . mouth rinse  15 mL Mouth Rinse 10 times per day  . pravastatin  20 mg Oral BH-q7a  . thiamine injection  100 mg Intravenous Daily   Continuous Infusions: . dexmedetomidine (PRECEDEX) IV infusion Stopped (10/10/17 0704)  . dextrose 5 % and 0.45 % NaCl with KCl 20 mEq/L 50 mL/hr at 10/10/17 0600  . feeding supplement (VITAL AF 1.2 CAL) Stopped (10/10/17 0000)  . fentaNYL infusion INTRAVENOUS 50 mcg/hr (10/10/17 0605)  . piperacillin-tazobactam (ZOSYN)  IV Stopped (10/10/17 0535)   PRN Meds:.acetaminophen **OR** acetaminophen (TYLENOL) oral liquid 160 mg/5 mL **OR** acetaminophen, albuterol, morphine injection, senna-docusate   Vitals:   10/10/17 0717 10/10/17 0800  BP: (!) 100/53   Pulse: 83   Resp: (!) 27   Temp:  98.6 F (37 C)  SpO2: 98%       PHYSICAL EXAM:  Eyes: Pupils equal,No pallor Neck: No neck swelling CVS: -s1 s2 regular Resp:Breath sounds equal; few  rales/ no rhonchi Abdomen:abd-soft, nondistended, BS+ Extremities:No edema Neuro:,tone Normal; follows commands by tracking eyes.Limb movts not  purposeful. Skin: no rash Integumentary: No clubbing No subconjunctival hge, subcutaneous nodules/splinte hemorrhages in toes/fingers   Ventilator Fi02- 40%, Peep- 5, Rate- 20, Vt- 424m Pip- 20s No AutoPEEP Now on CPAP 5/ PS 5- Vt 4027mapprox. Ve - 8 lit   CBC    Component Value Date/Time   WBC 9.4 10/10/2017 0359   RBC 2.84 (L) 10/10/2017 0359   HGB 8.4 (L) 10/10/2017 0359   HCT 26.5 (L) 10/10/2017 0359   PLT 182 10/10/2017 0359   MCV 93.3 10/10/2017 0359   MCH 29.6 10/10/2017 0359   MCHC 31.7 10/10/2017 0359   RDW 17.1 (H) 10/10/2017 0359   LYMPHSABS 1.8 10/08/2017 0452   MONOABS 0.5 10/08/2017 0452   EOSABS 0.2 10/08/2017 0452   BASOSABS 0.0 10/08/2017 0452   BMP Latest Ref Rng & Units 10/10/2017 10/09/2017 10/08/2017  Glucose 65 - 99 mg/dL 196(H) 147(H) 229(H)  BUN 6 - 20 mg/dL '20 13 18  ' Creatinine 0.44 - 1.00 mg/dL 1.13(H) 1.05(H) 1.23(H)  Sodium 135 - 145 mmol/L 141 142 149(H)  Potassium 3.5 - 5.1 mmol/L 3.7 3.7 3.8  Chloride 101 - 111 mmol/L 118(H) 119(H) 124(H)  CO2 22 - 32 mmol/L 13(L) 16(L) 16(L)  Calcium 8.9 - 10.3 mg/dL 7.9(L) 7.8(L) 7.7(L)     Results for GRBEATRIS, BELENMRN [id number] of 10/09/2017 10:34  Ref. Range 10/08/2017 03:27  pH, Arterial Latest Ref Range: 7.350 - 7.450  7.410  pCO2 arterial Latest Ref Range: 32.0 - 48.0 mmHg 23.9 (L)  pO2, Arterial Latest Ref Range: 83.0 - 108.0 mmHg 124 (H)  Acid-base deficit Latest Ref Range: 0.0 - 2.0 mmol/L 8.9 (H)  Bicarbonate Latest Ref Range: 20.0 - 28.0 mmol/L      12/17 CT angio head, neck 1. Occluded right cavernous ICA and occluded or severely stenotic right M1 segment. Hypertrophic lenticulostriate on the right with prompt filling of right MCA branches, a chronic appearance. The right cervical ICA is diffusely diminutive, presumably secondary. 2. Right P2 occlusion. 3. Moderate to advanced left paraclinoid ICA segment narrowing. 4. Poor visualization of the right V1 segment due to  artifact and/or atheromatous narrowing. 5. Known acute infarcts in the right posterior cerebrum and right cerebellum. No hydrocephalus, acute hemorrhage, or visible progression.     CHEST X-RAY:12/17 Endotracheal tube tip 2 cm above the carina. Right upper extremity PICC with tip at the SVC. Feeding tube which at least reaches the stomach. Bandlike atelectasis at the left base. No edema, effusion, or pneumothorax. Borderline heart size. Negative aortic and hilar Contours.  Ekg-12.9.18sinus- no acute changes     12/9 echo - Left ventricle: The cavity size was normal. Wall thickness was increased in a pattern of mild LVH. Systolic function was normal. The estimated ejection fraction was in the range of 60% to 65%. Wall motion was normal; there were no regional wall motion abnormalities. Doppler parameters are consistent with abnormal left ventricular relaxation (grade 1 diastolic dysfunction). - Mitral valve: There was mild regurgitation.   Ct brain 12/14 1. New, large area of cytotoxic edema within the posterior right hemisphere, involving the posterior parietal and temporal lobes and the occipital lobe. This includes portions of the posterior right MCA territory and the PCA territory. 2. Unchanged cytotoxic edema of the right cerebellar hemisphere with mass effect on the fourth ventricle. 3. No herniation, hydrocephalus or hemorrhage.   12/9 MRI/MRA brain 1.  Acute/subacute inferior right cerebellum and vermis infarct with associated hemorrhage. 2. Additional scattered punctate foci of acute nonhemorrhagic infarct involving the right PCA territory with 1 lesion involving the right MCA territory along the right middle frontal gyrus. 3. Multiple foci of remote encephalomalacia involving the inferior posterior right temporal lobe, lateral right temporal lobe, and high right parietal lobe. 4. High-grade stenosis or occlusion of the cavernous  right internal carotid artery. The right A1 and likely M1 segments fill via a patent anterior communicating artery. 5. Fenestration of the vertebrobasilar junction without significant basilar artery stenosis. 6. Marked attenuation of PCA branch vessels, worse on the right. Moderate proximal right PCA stenosis. 7. Fetal type left posterior cerebral artery.Assessment- Plan:     Assessment-Plan:  Acute, large Right cerebellar stroke with hemorrhage and mass effect; acute nonhemorrhagic infarct -Right PCA territory Multiple foci of remote encephalomalacia - inferior posterior right temporal lobe, lateral right temporal lobe, and high right parietal lobe; High-grade stenosis or occlusion of the cavernous right internal carotid artery.  Moderate proximal right PCA stenosis Suspected Etiology: Likely large vessel disease with high-grade terminal right ICA stenosis/occlusion and intracranial atherosclerosis  Stroke Risk Factors: hyperlipidemia, hypertension,CAD, PVD Other Stroke Risk Factors: Advanced age, Cigarette smoker, Migraines  AKI (acute kidney injury)   pyelonephritis/Hydronephrosis (R) acuteEncephalopathy MSSA bacteremia Fevers ? endocarditis Aspiration Acute Respiratory Failure secondary to aspiration and CVA.   Neurology Airway protected by ETT Monitor HD, glucose, Na On ASA now May consider DAPT on discharge if pt condition improves Permissive hypertension (OK if <220/120) for 24-48 hours post stroke and then gradually normalized within 5-7 days. Long term BP goal130-150 due to right ICA occlusion   Cardiology Off propofol, amlodipine, labetalol due to low bp. TEE planned for today   Pulmonary SBTas tolerated; resume AC mode for protected airway for TEE. Await Neuro recovery before extubation. F/u CXR, ABG Extubate when TEE done and pt passes SBT   GI /nutrition Tube feeds Lactobacillus for diarrhea CDT  negative Imodium prn  Renal, fluids, electrolytes Monitor lytes  replete K  Hematology Monitor CBC On ASA,  Endocrinology On synthroid  Infectious diseases On  zosyn off vanco PICC line out 12/17 Rpt blood c/s  Plan new access for ng term antibx-  Inf Dis f/u   Lines -Devices 12/14 rectal tube,ETT, 12/12 Gastric tube 12/9 PICC  12/8 Foley   Husband counseled   Goals of Care:Full code     Thank you for letting me participate in the care of your patient.  ^^^^^^^^^^ I Have personally spent55Minutes In the care of this Patient providing Critical care Services; Time includes review of chart, labs, imaging, coordinating care with other physicians and healthcare team members. Also includes time for frequent reevaluation and additional treatment implementation due to change in clinical condiiton of patient. Excludes time spent for Procedure and Teaching.   ^^^^^^^^^^  Note subject to typographical and grammatical errors;  Any formal questions or concerns about the content, text, or information contained within the body of this dictation should be directly addressed to the physician for clarification.   Evans Lance, MD Pulmonary and [place] Pager: [phone]

## 2017-10-11 ENCOUNTER — Inpatient Hospital Stay (HOSPITAL_COMMUNITY): Payer: BLUE CROSS/BLUE SHIELD

## 2017-10-11 DIAGNOSIS — I824Z2 Acute embolism and thrombosis of unspecified deep veins of left distal lower extremity: Secondary | ICD-10-CM

## 2017-10-11 DIAGNOSIS — I639 Cerebral infarction, unspecified: Secondary | ICD-10-CM

## 2017-10-11 DIAGNOSIS — B9561 Methicillin susceptible Staphylococcus aureus infection as the cause of diseases classified elsewhere: Secondary | ICD-10-CM

## 2017-10-11 DIAGNOSIS — I82409 Acute embolism and thrombosis of unspecified deep veins of unspecified lower extremity: Secondary | ICD-10-CM

## 2017-10-11 DIAGNOSIS — I82431 Acute embolism and thrombosis of right popliteal vein: Secondary | ICD-10-CM

## 2017-10-11 LAB — GLUCOSE, CAPILLARY
GLUCOSE-CAPILLARY: 157 mg/dL — AB (ref 65–99)
GLUCOSE-CAPILLARY: 172 mg/dL — AB (ref 65–99)
Glucose-Capillary: 168 mg/dL — ABNORMAL HIGH (ref 65–99)
Glucose-Capillary: 175 mg/dL — ABNORMAL HIGH (ref 65–99)
Glucose-Capillary: 134 mg/dL — ABNORMAL HIGH (ref 65–99)
Glucose-Capillary: 155 mg/dL — ABNORMAL HIGH (ref 65–99)

## 2017-10-11 LAB — BLOOD GAS, ARTERIAL
ACID-BASE DEFICIT: 9.1 mmol/L — AB (ref 0.0–2.0)
ACID-BASE DEFICIT: 8.1 mmol/L — AB (ref 0.0–2.0)
BICARBONATE: 15 mmol/L — AB (ref 20.0–28.0)
Bicarbonate: 15.7 mmol/L — ABNORMAL LOW (ref 20.0–28.0)
DRAWN BY: 511911
DRAWN BY: 448981
FIO2: 30
FIO2: 30
LHR: 20 {breaths}/min
MECHVT: 330 mL
O2 SAT: 96.7 %
O2 SAT: 97.5 %
PCO2 ART: 25.9 mmHg — AB (ref 32.0–48.0)
PEEP/CPAP: 5 cmH2O
PEEP/CPAP: 5 cmH2O
PH ART: 7.372 (ref 7.350–7.450)
PO2 ART: 92.1 mmHg (ref 83.0–108.0)
Patient temperature: 100.4
Patient temperature: 98.6
Pressure support: 5 cmH2O
pCO2 arterial: 26.8 mmHg — ABNORMAL LOW (ref 32.0–48.0)
pH, Arterial: 7.401 (ref 7.350–7.450)
pO2, Arterial: 93 mmHg (ref 83.0–108.0)

## 2017-10-11 LAB — CBC
HEMATOCRIT: 26.2 % — AB (ref 36.0–46.0)
HEMOGLOBIN: 8.4 g/dL — AB (ref 12.0–15.0)
MCH: 29.7 pg (ref 26.0–34.0)
MCHC: 32.1 g/dL (ref 30.0–36.0)
MCV: 92.6 fL (ref 78.0–100.0)
Platelets: 281 10*3/uL (ref 150–400)
RBC: 2.83 MIL/uL — AB (ref 3.87–5.11)
RDW: 17.3 % — ABNORMAL HIGH (ref 11.5–15.5)
WBC: 10.1 10*3/uL (ref 4.0–10.5)

## 2017-10-11 LAB — BASIC METABOLIC PANEL
ANION GAP: 11 (ref 5–15)
BUN: 22 mg/dL — ABNORMAL HIGH (ref 6–20)
CHLORIDE: 116 mmol/L — AB (ref 101–111)
CO2: 13 mmol/L — AB (ref 22–32)
Calcium: 8.5 mg/dL — ABNORMAL LOW (ref 8.9–10.3)
Creatinine, Ser: 1.06 mg/dL — ABNORMAL HIGH (ref 0.44–1.00)
GFR calc Af Amer: >60 mL/min (ref >60)
GFR calc non Af Amer: 56 mL/min — ABNORMAL LOW (ref >60)
GLUCOSE: 167 mg/dL — AB (ref 65–99)
POTASSIUM: 3.9 mmol/L (ref 3.5–5.1)
Sodium: 140 mmol/L (ref 135–145)

## 2017-10-11 LAB — PHOSPHORUS: Phosphorus: 2.1 mg/dL — ABNORMAL LOW (ref 2.5–4.6)

## 2017-10-11 LAB — MAGNESIUM: MAGNESIUM: 2 mg/dL (ref 1.7–2.4)

## 2017-10-11 LAB — HEPARIN LEVEL (UNFRACTIONATED): HEPARIN UNFRACTIONATED: 0.68 [IU]/mL (ref 0.30–0.70)

## 2017-10-11 MED ORDER — LOPERAMIDE HCL 1 MG/5ML PO LIQD
1.0000 mg | ORAL | Status: DC | PRN
  Administered 2017-10-11 – 2017-10-14 (×4): 1 mg via ORAL
  Filled 2017-10-11 (×5): qty 5

## 2017-10-11 MED ORDER — HEPARIN (PORCINE) IN NACL 100-0.45 UNIT/ML-% IJ SOLN
800.0000 [IU]/h | INTRAMUSCULAR | Status: DC
  Administered 2017-10-11: 1050 [IU]/h via INTRAVENOUS
  Filled 2017-10-11: qty 250

## 2017-10-11 MED ORDER — HEPARIN BOLUS VIA INFUSION
4000.0000 [IU] | Freq: Once | INTRAVENOUS | Status: DC
  Administered 2017-10-11: 4000 [IU] via INTRAVENOUS
  Filled 2017-10-11: qty 4000

## 2017-10-11 MED ORDER — SODIUM BICARBONATE 650 MG PO TABS
650.0000 mg | ORAL_TABLET | Freq: Two times a day (BID) | ORAL | Status: DC
  Administered 2017-10-11 – 2017-10-12 (×3): 650 mg via ORAL
  Filled 2017-10-11 (×3): qty 1

## 2017-10-11 MED ORDER — FENTANYL CITRATE (PF) 100 MCG/2ML IJ SOLN
25.0000 ug | INTRAMUSCULAR | Status: DC | PRN
Start: 2017-10-11 — End: 2017-10-25
  Administered 2017-10-11 – 2017-10-20 (×16): 25 ug via INTRAVENOUS
  Filled 2017-10-11 (×18): qty 2

## 2017-10-11 NOTE — Progress Notes (Signed)
ANTICOAGULATION CONSULT NOTE - Initial Consult  Pharmacy Consult for Lovenox Indication: DVT  Allergies  Allergen Reactions  . Ativan [Lorazepam] Other (See Comments)    Patient's husband said related to being oversedated not true allergy    Patient Measurements: Height: 5\' 4"  (162.6 cm) Weight: 140 lb 14 oz (63.9 kg) IBW/kg (Calculated) : 54.7 Heparin Dosing Weight: 63.9 kg  Vital Signs: Temp: 99.1 F (37.3 C) (12/19 1200) Temp Source: Axillary (12/19 1200) BP: 101/60 (12/19 1200) Pulse Rate: 87 (12/19 1200)  Labs: Recent Labs    10/09/17 0500 10/10/17 0359 10/11/17 0240  HGB 8.4* 8.4* 8.4*  HCT 25.9* 26.5* 26.2*  PLT 173 182 281  CREATININE 1.05* 1.13* 1.06*    Estimated Creatinine Clearance: 49.3 mL/min (A) (by C-G formula based on SCr of 1.06 mg/dL (H)).   Medical History: Past Medical History:  Diagnosis Date  . Asthma   . Bronchitis   . DDD (degenerative disc disease)   . Depression   . Dyspnea    on exertion  . Fibromyalgia   . GERD (gastroesophageal reflux disease)    hx. of   . History of depression   . Hypertension   . Hypothyroidism   . Leg pain   . Migraine   . Peripheral arterial disease ([facility])   . Peripheral vascular disease ([facility])   . Sciatica   . Stroke (cerebrum) ([facility]) 09/23/2017  . Thyroid disease   . 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 Ulcer     Assessment: 61 yo female s/p R MCA and cerebellar stroke around 12/8 with mass effect and currently being treated for mssa bacteremia. Found to have L lower extremity DVT - to start heparin infusion. Neuro ok with anticoagulation - hold all boluses   Goal of Therapy:  Heparin goal: 0.3-0.4 units/ml Monitor platelets by anticoagulation protocol: Yes    Plan:  -Heparin infusion at 1050 units/hr -Daily HL, CBC -Level tonight -F/u plan for oral anticoagulation  Ashlee Snow, Ashlee HouseholderAlison M 10/11/2017,1:55 PM

## 2017-10-11 NOTE — Progress Notes (Signed)
PT Cancellation Note  Patient Details Name: Leane ParaDebra A Reicher MRN: [id number] DOB: [birth year]   Cancelled Treatment:    Reason Eval/Treat Not Completed: Medical issues which prohibited therapy. Pt with new DVT.    Angelina OkCary W Maycok 10/11/2017, 3:33 PM Fluor CorporationCary Latravious Levitt PT [phone]

## 2017-10-11 NOTE — Progress Notes (Signed)
ANTICOAGULATION CONSULT NOTE - Initial Consult  Pharmacy Consult for Heparin Indication: DVT (in the setting of recent CVA)  Allergies  Allergen Reactions  . Ativan [Lorazepam] Other (See Comments)    Patient's husband said related to being oversedated not true allergy    Patient Measurements: Height: 5\' 4"  (162.6 cm) Weight: 140 lb 14 oz (63.9 kg) IBW/kg (Calculated) : 54.7 Heparin Dosing Weight: 63.9 kg  Vital Signs: Temp: 97.8 F (36.6 C) (12/19 2000) Temp Source: Oral (12/19 2000) BP: 131/71 (12/19 2100) Pulse Rate: 85 (12/19 2000)  Labs: Recent Labs    10/09/17 0500 10/10/17 0359 10/11/17 0240 10/11/17 2046  HGB 8.4* 8.4* 8.4*  --   HCT 25.9* 26.5* 26.2*  --   PLT 173 182 281  --   HEPARINUNFRC  --   --   --  0.68  CREATININE 1.05* 1.13* 1.06*  --     Estimated Creatinine Clearance: 49.3 mL/min (A) (by C-G formula based on SCr of 1.06 mg/dL (H)).   Medical History: Past Medical History:  Diagnosis Date  . Asthma   . Bronchitis   . DDD (degenerative disc disease)   . Depression   . Dyspnea    on exertion  . Fibromyalgia   . GERD (gastroesophageal reflux disease)    hx. of   . History of depression   . Hypertension   . Hypothyroidism   . Leg pain   . Migraine   . Peripheral arterial disease ([facility])   . Peripheral vascular disease ([facility])   . Sciatica   . Stroke (cerebrum) ([facility]) 10/19/2017  . Thyroid disease   . 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 Ulcer     Assessment: 61 yo female s/p R MCA and cerebellar stroke around 12/8 with mass effect and currently being treated for mssa bacteremia. Found to have L lower extremity DVT - to start heparin infusion. Neuro ok with anticoagulation - hold all boluses  Initial heparin level above goal.  No bleeding noted.  Goal of Therapy:  Heparin goal: 0.3-0.4 units/ml Monitor platelets by anticoagulation protocol: Yes    Plan:  -Reduce Heparin infusion to 900  units/hr -Next level in 6 hours -Daily heparin level and CBC -F/u plan for oral anticoagulation  Toys 'R' UsKimberly Idania Desouza, Pharm.D., BCPS Clinical Pharmacist 10/11/2017 10:04 PM

## 2017-10-11 NOTE — Progress Notes (Signed)
STROKE TEAM PROGRESS NOTE   SUBJECTIVE (INTERVAL HISTORY) Pt RN at bedside. Eyes open, a little more lethargic this am and not following commands as good as yesterday, but largely neuro unchanged. TEE cancelled for now as cardiology did not think that will change management at this time. DVT at left peroneal veins, discussed with Dr. Sherren Kerns, will not need to intervene at this time.     OBJECTIVE Lab Results: CBC:  Recent Labs  Lab 10/09/17 0500 10/10/17 0359 10/11/17 0240  WBC 9.6 9.4 10.1  HGB 8.4* 8.4* 8.4*  HCT 25.9* 26.5* 26.2*  MCV 91.8 93.3 92.6  PLT 173 182 281   BMP: Recent Labs  Lab 10/04/17 1520  10/04/2017 0523 10/09/2017 1709 10/06/17 0621 10/06/17 2020 10/07/17 0500 10/08/17 0452 10/09/17 0500 10/10/17 0359 10/11/17 0240  NA  --    < > 168*  --  157* 158* 155* 149* 142 141 140  K  --    < > 3.2*  --  5.2* 3.1* 3.1* 3.8 3.7 3.7 3.9  CL  --    < > >130*  --  >130* >130* 115* 124* 119* 118* 116*  CO2  --    < > 19*  --  18* 19* 16* 16* 16* 13* 13*  GLUCOSE  --    < > 128*  --  563* 118* 233* 229* 147* 196* 167*  BUN  --    < > 34*  --  23* 20 18 18 13 20  22*  CREATININE  --    < > 2.01*  --  1.43* 1.36* 1.73* 1.23* 1.05* 1.13* 1.06*  CALCIUM  --    < > 7.9*  --  7.0* 7.5* 6.7* 7.7* 7.8* 7.9* 8.5*  MG 2.2  --  2.2 2.2 2.1 2.1  --   --   --   --  2.0  PHOS 3.5  --  4.0 2.8  --   --   --   --   --   --  2.1*   < > = values in this interval not displayed.   Liver Function Tests:  Recent Labs  Lab 10/08/17 0452  AST 74*  ALT 22  ALKPHOS 64  BILITOT 0.7  PROT 5.3*  ALBUMIN 2.2*   No results for input(s): AMMONIA in the last 168 hours. Cardiac Enzymes:  No results for input(s): CKTOTAL, CKMB, CKMBINDEX, TROPONINI in the last 168 hours. Coagulation Studies:  No results for input(s): APTT, INR in the last 72 hours. PHYSICAL EXAM Temp:  [98.3 F (36.8 C)-102 F (38.9 C)] 98.3 F (36.8 C) (12/19 0800) Pulse Rate:  [89-111] 89 (12/19 1153) Resp:  [21-35]  24 (12/19 1153) BP: (117-164)/(57-78) 152/76 (12/19 1153) SpO2:  [97 %-100 %] 99 % (12/19 1153) FiO2 (%):  [30 %] 30 % (12/19 1153) Weight:  [140 lb 14 oz (63.9 kg)] 140 lb 14 oz (63.9 kg) (12/19 0400)  General - Well nourished, well developed, intubated on fentanyl.  Ophthalmologic - Fundi not visualized due to noncooperation.  Cardiovascular - Regular rate and rhythm.  Neuro - intubated on fentanyl, eyes spontaneously open, not able to follow simple commands. PERRL, not blinking to visual threat on the left, but able to blink on the right, right eye gaze preference, not able to cross midline, positive corneal, gag and cough. RUE spontaneous movement against gravity movement, RLE 2/5 withdraw to pain, LUE 0/5 and LLE 2/5 withdraw on pain. DTR 1+ and positive babinski bilaterally. Sensation, coordination and  gait not tested.   IMAGING: I have personally reviewed the radiological images below and agree with the radiology interpretations.  Ct Head Wo Contrast 10/01/2017 IMPRESSION: No significant change since yesterday. Acute infarction affecting the right cerebellum with swelling. Mass-effect upon the fourth ventricle but no change in size the lateral or third ventricles at this time. No hemorrhage. 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 Electronically Signed   By: Paulina FusiMark  Shogry M.D.   On: 10/01/2017 07:45   MRI/MRA Brain Wo Contrast 10/01/2017 IMPRESSION: 1. Acute/subacute inferior right cerebellum and vermis infarct with associated hemorrhage. 2. Additional scattered punctate foci of acute nonhemorrhagic infarct involving the right PCA territory with 1 lesion involving the right MCA territory along the right middle frontal gyrus. 3. Multiple foci of remote encephalomalacia involving the inferior posterior right temporal lobe, lateral right temporal lobe, and high right parietal lobe. 4. 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 The  right A1 and likely M1 segments fill via a patent anterior communicating artery. 5. Fenestration of the vertebrobasilar junction without significant basilar artery stenosis. 6. Marked attenuation of PCA branch vessels, worse on the right. Moderate proximal right PCA stenosis. 7. Fetal type left posterior cerebral artery.   Echocardiogram:  - Left ventricle: The cavity size was normal. Wall thickness was   increased in a pattern of mild LVH. Systolic function was normal.   The estimated ejection fraction was in the range of 60% to 65%.   Wall motion was normal; there were no regional wall motion   abnormalities. Doppler parameters are consistent with abnormal   left ventricular relaxation (grade 1 diastolic dysfunction). - Mitral valve: There was mild regurgitation.  B/L Carotid U/S:                                                Right ECA and ICA waveforms are very similar. Unable to interrogate stenosis.  40-59% left ICA stenosis. Bilateral vertebral artery flow is antegrade.   CT Head: 10/02/17                                                    1. No significant change in the appearance of acute to subacute, hemorrhagic right inferior cerebellar infarct with slight positive mass effect on the adjacent fourth ventricle. No dilatation the third or lateral ventricles. 2. Chronic right parietal, posteromedial temporal and occipital lobe infarcts as well as right periventricular white matter tract lacunar infarct.   EEG 10/02/17:   This normal EEG is recorded in the waking state     Ct Head Wo Contrast 10/06/2017 IMPRESSION: 1. New, large area of cytotoxic edema within the posterior right hemisphere, involving the posterior parietal and temporal lobes and the occipital lobe. This includes portions of the posterior right MCA territory and the PCA territory. 2. Unchanged cytotoxic edema of the right cerebellar hemisphere with mass effect on the fourth ventricle. 3. No herniation, hydrocephalus  or hemorrhage.   Ct Angio Head and neck W Or Wo Contrast 10/09/2017 IMPRESSION: 1. Occluded right cavernous ICA and occluded or severely stenotic right M1 segment. Hypertrophic lenticulostriate on the right with prompt filling of right MCA branches, a chronic appearance. The right  cervical ICA is diffusely diminutive, presumably secondary. 2. Right P2 occlusion. 3. Moderate to advanced left paraclinoid ICA segment narrowing. 4. Poor visualization of the right V1 segment due to artifact and/or atheromatous narrowing. 5. Known acute infarcts in the right posterior cerebrum and right cerebellum. No hydrocephalus, acute hemorrhage, or visible progression.   LE venous doppler - Right leg, negative for deep and superficial vein thrombosis. Left leg, positive for acute deep vein thrombosis involving the peroneal veins               ASSESSMENT: Ms. [name] is a 61 y.o. female with PMH of prior hypertension, coronary artery disease, hypothyroidism, chronic pain, depression, smoker and peripheral arterial disease status post aorto-iliac bypass in June admitted for fever, confusion, right flank pain. Found to have right pyelonephritis and hydronephrosis. Also MRI showed right cerebellar large infarcts with right MCA scattered infarcts. B Cx showed MSSA. On Abx. However, repeat CT head on 10/06/17 showed new large right MCA infarcts. Concerning for endocarditis.   Acute, large Right cerebellar stroke with petechial hemorrhagic conversion as well as acute large right MCA and PCA infarct - concerning for endocarditis. Pt also found to have High-grade stenosis or occlusion of the cavernous right internal carotid artery.    Resultant - intubated on sedation, left hemiparesis  CT head 10/02/17 right cerebellar large infarct  MRI head 10/01/17 right cerebellar large infarct and right MCA scattered small infarcts. Old right PCA, right caudate, right parietal high convexity  MRA head - right ICA cavernous high  grade stenosis vs. Occlusion, b/l M1 stenosis, right PCA distal stenosis  CT head 10/06/17 - new large right MCA and PCA infarct with cytotoxic edema  CTA head and neck - right ICA occlusion, right M1 high grade stenosis vs. Occlusion, right P2 occlusion and right V1 high grade stenosis, no mycotic aneurysms  CUS - waveform consistent with right ICA distal stenosis vs. Occlusion  TTE EF 60-65%, no vegetation  Consider TEE to rule out endocarditis and also evaluate PFO once pt more medically stable   Heparin subq for DVT prophylaxis  NPO for now  No antithrombotics PTA, now on ASA 325mg .  Therapy recommendation - pending  Deposition - pending  Sepsis with pyelonephritis and bacteremia - ? Endocarditis  Intermittent high grade fever with low BP  Admitted for pyelonephritis with hydronephrosis confirmed on CT  B Cx positive for MSSA 10/03/17, repeat culture neg  Urine culture neg  Sputum culture neg  On nafcillin  Due to recurrent stroke, concerning for endocarditis  Will consider TEE once pt more medically stable  Respiratory failure  Intubated 10/06/17  CCM on board  On low dose sedation  On weaning trial  Extubate once able  Right LE acute DVT  LE venous doppler confirmed RLE DVT at peroneal vein  Discussed with Dr. Odessa FlemingSivakuma, not need to intervene at this time  Will continue to monitor, if DVT progression, then may consider AC  Will need TEE or TCD bubble study later to evaluate PFO  As right MCA and right cerebellar infarcts have been more than 7 days, AC can be considered, but recommend heparin level goal 0.3-0.4 without bolus  AKI  Cre 1.64->2.01->1.36->1.73->1.23->1.05->1.13->1.06  On IVF and TF  CCM on board  Continue to monitor  Intracranial atherosclerosis and stenosis:  MRA showed right ICA distal occlusion, b/l M1 stenosis, right PCA distal stenosis  CTA head and neck - right ICA occlusion, right M1 high grade stenosis vs.  Occlusion, right  P2 occlusion and right V1 high grade stenosis,  CUS consistent with right ICA distal stenosis or occlusion  On ASA now  May consider DAPT on discharge if pt condition improves  Will consider cerebral angiogram to further assess right ICA and MCA occlusion/stenosis  Avoid hypotension  HTN / hypotension  Stable now  D/c'ed amlodipine  Resume TF and continue IVF  BP goal 130-150 due to right ICA occlusion  Avoid hypotension  HYPERLIPIDEMIA  Home Meds:  Pravachol 20 mg  LDL 46,  goal < 70  AST 183->74  ALT 72->22  Resumed pravachol 20  TOBACCO ABUSE  Current smoker  Smoking cessation counseling when applicable  Nicotine patch PRN  Other Active Problems:  Hypernatremia - resolved  Hyperglycemia - improved  Hospital day # 11  This patient is critically ill due to recurrent stroke, sepsis, fever, bacteremia, AKI, intubated, DVT and at significant risk of neurological worsening, death form recurrent stroke, hemorrhagic conversion, septic shock, seizure, endocarditis, kidney failure. This patient's care requires constant monitoring of vital signs, hemodynamics, respiratory and cardiac monitoring, review of multiple databases, neurological assessment, discussion with family, other specialists and medical decision making of high complexity. I had long discussion with husband at bedside, updated pt current condition, treatment plan and potential prognosis. He expressed understanding and appreciation. I discussed also with Dr. Sherren Kerns over the phone. I spent 45 minutes of neurocritical care time in the care of this patient.  Marvel Plan, MD PhD Stroke Neurology 10/11/2017 11:57 AM   To contact Stroke Continuity provider, please refer to [email or web address]. After hours, contact General Neurology

## 2017-10-11 NOTE — Progress Notes (Signed)
*  PRELIMINARY RESULTS* Vascular Ultrasound Right upper extremity venous duplex has been completed.  Preliminary findings: acute deep vein thrombosis in the distal right subclavian, axillary, proximal brachial veins.  Superficial vein thrombosis throughout the right cephalic and basilic veins.  Somewhat difficult exam due to patient position and cooperation.  Preliminary results given to Dr. Sherren KernsSivakumar @ bedside, 16:00.  Chauncey FischerCharlotte C Dinah Lupa 10/11/2017, 3:59 PM

## 2017-10-11 NOTE — Progress Notes (Signed)
Preliminary results by tech - Venous Duplex Lower Ext. Completed. Right leg, negative for deep and superficial vein thrombosis. Left leg, positive for acute deep vein thrombosis involving the peroneal veins. Results given to patient's nurse, Orpha BurKaty. Marilynne Halstedita Madyn Ivins, BS, RDMS, RVT

## 2017-10-11 NOTE — Progress Notes (Signed)
OT Cancellation    10/11/17 1700  OT Visit Information  Last OT Received On 10/11/17  Reason Eval/Treat Not Completed Medical issues which prohibited therapy. Pt with new DVT. Will return as pt medically appropriate and as schedule allows. Thank you.   Alexandros Ewan MSOT, OTR/L Acute Rehab Pager: [phone] Office: [phone]

## 2017-10-11 NOTE — Progress Notes (Signed)
[facility] for Infectious Disease  Date of Admission:  10/14/2017             ASSESSMENT/PLAN  MSSA Bacteremia - Continues to have fevers. No leukocytosis. Restarted on Nafcillin and appears to be tolerating with no adverse side effects. Repeat blood cultures remain in process. Primary team ordered venous dopplers which were positive for DVT which could be a cause of her continued fevers. Continue Nafcillin. Will plan to treat for 6 weeks. Monitor cultures.  DVT - New DVT noted on venous dopplers in left peroneal vein. Treatment per primary team.   Cerebellar Stroke - Appears stable. Able to follow commands at times. Continue treatment per neurology.   Principal Problem:   MSSA bacteremia Active Problems:   Sepsis ([place])   Stroke (cerebrum) ([facility])   AKI (acute kidney injury) ([place])   Obstructive pyelonephritis   Thrombocytopenia ([facility])   Hypokalemia   Lactic acidosis   Encephalopathy acute   Aspiration into airway   Fever   Dyspnea   Respirator dependent ([place])   Staphylococcus aureus bacteremia with sepsis ([place])   .  stroke: mapping our early stages of recovery book   Does not apply Once  . aspirin  325 mg Oral Daily  . chlorhexidine gluconate (MEDLINE KIT)  15 mL Mouth Rinse BID  . famotidine  20 mg Oral BID  . fentaNYL (SUBLIMAZE) injection  50 mcg Intravenous Once  . heparin injection (subcutaneous)  5,000 Units Subcutaneous Q8H  . insulin aspart  0-15 Units Subcutaneous Q4H  . ipratropium-albuterol  3 mL Nebulization BID  . lactobacillus  1 g Oral Daily  . levothyroxine  50 mcg Intravenous Daily  . mouth rinse  15 mL Mouth Rinse 10 times per day  . pravastatin  20 mg Oral [id number]  . sodium bicarbonate  650 mg Oral BID  . thiamine injection  100 mg Intravenous Daily    SUBJECTIVE:  Febrile this morning at 102. No leukocytosis. No major events overnight. LE dopplers completed showing a DVT in the right peroneal vein. TEE cancelled at this time.   Allergies    Allergen Reactions  . Ativan [Lorazepam] Other (See Comments)    Patient's husband said related to being oversedated not true allergy     Review of Systems: Review of Systems  Unable to perform ROS: Intubated      OBJECTIVE: Vitals:   10/11/17 0800 10/11/17 0846 10/11/17 0900 10/11/17 1153  BP:  (!) 154/77 140/67 (!) 152/76  Pulse:  91 99 89  Resp:  (!) 25 (!) 21 (!) 24  Temp: 98.3 F (36.8 C)     TempSrc: Axillary     SpO2:  100% 100% 99%  Weight:      Height:       Body mass index is 24.18 kg/m.  Physical Exam  Constitutional:  Lying in bed with eyes open.   Cardiovascular: Normal rate, regular rhythm, normal heart sounds and intact distal pulses. Exam reveals no gallop.  No murmur heard. Pulmonary/Chest: Effort normal and breath sounds normal. No respiratory distress. She has no wheezes. She has no rales. She exhibits no tenderness.  Abdominal: Soft. Bowel sounds are normal. She exhibits no distension.  Neurological: She is alert.  Skin: Skin is warm and dry.    Lab Results Lab Results  Component Value Date   WBC 10.1 10/11/2017   HGB 8.4 (L) 10/11/2017   HCT 26.2 (L) 10/11/2017   MCV 92.6 10/11/2017   PLT 281 10/11/2017  Lab Results  Component Value Date   CREATININE 1.06 (H) 10/11/2017   BUN 22 (H) 10/11/2017   NA 140 10/11/2017   K 3.9 10/11/2017   CL 116 (H) 10/11/2017   CO2 13 (L) 10/11/2017    Lab Results  Component Value Date   ALT 22 10/08/2017   AST 74 (H) 10/08/2017   ALKPHOS 64 10/08/2017   BILITOT 0.7 10/08/2017     Microbiology: Recent Results (from the past 240 hour(s))  Culture, blood (routine x 2)     Status: None   Collection Time: 10/03/17  9:40 AM  Result Value Ref Range Status   Specimen Description BLOOD LEFT HAND  Final   Special Requests IN PEDIATRIC BOTTLE Blood Culture adequate volume  Final   Culture NO GROWTH 5 DAYS  Final   Report Status 10/08/2017 FINAL  Final  Culture, blood (routine x 2)     Status: None    Collection Time: 10/03/17  9:50 AM  Result Value Ref Range Status   Specimen Description BLOOD LEFT HAND  Final   Special Requests IN PEDIATRIC BOTTLE Blood Culture adequate volume  Final   Culture NO GROWTH 5 DAYS  Final   Report Status 10/08/2017 FINAL  Final  C difficile quick scan w PCR reflex     Status: None   Collection Time: 10/06/17 10:05 AM  Result Value Ref Range Status   C Diff antigen NEGATIVE NEGATIVE Final   C Diff toxin NEGATIVE NEGATIVE Final   C Diff interpretation No C. difficile detected.  Final  Culture, Urine     Status: None   Collection Time: 10/06/17 11:04 AM  Result Value Ref Range Status   Specimen Description URINE, CATHETERIZED  Final   Special Requests Normal  Final   Culture NO GROWTH  Final   Report Status 10/07/2017 FINAL  Final  Culture, respiratory (NON-Expectorated)     Status: None   Collection Time: 10/06/17  2:31 PM  Result Value Ref Range Status   Specimen Description TRACHEAL ASPIRATE  Final   Special Requests Normal  Final   Gram Stain   Final    MODERATE WBC PRESENT, PREDOMINANTLY MONONUCLEAR NO ORGANISMS SEEN    Culture NO GROWTH 2 DAYS  Final   Report Status 10/09/2017 FINAL  Final     Terri Piedra, NP [facility] Infect[facility] [phone] Pager  10/11/2017  12:10 PM

## 2017-10-11 NOTE — Progress Notes (Addendum)
CRITICAL CARE PROGRESS NOTE     Mechanical Ventilation day #6 post stroke day11  Name:Ashlee Snow WUX:[id number] DOB:08/14/1956  ADMISSION DATE:10/06/2017   CHIEF COMPLAINT:Fever and nausea HISTORY OF PRESENT ILLNESS: This is a 61 year old with a past medical history of fibromyalgia, hypertension, and hypothyroidism who for several days prior to admission had apparently been suffering from nausea and vomiting. She subsequently had fevers and was evaluated at an outside hospital where she was found to have left hydronephrosis. A ureteral stent was placed and she was treated for urosepsis with antibiotics and generous fluids. She has grown MSSA from her blood.  [facility] urine sample &culture was contaminated,so we have no culture of her urine on presentation..CT scan of the head has shown a cerebellar infarction.  Her oxygen requirements increased substantially on 12/10 and Levaquin was added to nafcillin to cover for presumed aspiration. Zosyn was subsequently substituted for both. Her oxygen requirements have improved, but her mental status has not. Today she is febrile. She is having liquid stool.   Subjective: Febrile overnight 102, improved this am.   Soft BP resolved.  Good UOP  Ongoing diarrhea       Scheduled Meds: .  stroke: mapping our early stages of recovery book   Does not apply Once  . aspirin  325 mg Oral Daily  . chlorhexidine gluconate (MEDLINE KIT)  15 mL Mouth Rinse BID  . famotidine  20 mg Oral BID  . fentaNYL (SUBLIMAZE) injection  50 mcg Intravenous Once  . heparin injection (subcutaneous)  5,000 Units Subcutaneous Q8H  . insulin aspart  0-15 Units Subcutaneous Q4H  . ipratropium-albuterol  3 mL Nebulization BID  . lactobacillus  1 g Oral Daily  . levothyroxine  50 mcg Intravenous Daily  . mouth rinse  15 mL Mouth Rinse 10 times per day  . pravastatin  20 mg Oral BH-q7a  . thiamine injection  100 mg  Intravenous Daily   Continuous Infusions: . dexmedetomidine (PRECEDEX) IV infusion Stopped (10/10/17 0704)  . dextrose 5 % and 0.45 % NaCl with KCl 20 mEq/L 50 mL/hr at 10/11/17 0600  . feeding supplement (VITAL AF 1.2 CAL) Stopped (10/11/17 0800)  . fentaNYL infusion INTRAVENOUS 50 mcg/hr (10/11/17 0800)  . nafcillin IV 2 g (10/11/17 0753)   PRN Meds:.acetaminophen **OR** acetaminophen (TYLENOL) oral liquid 160 mg/5 mL **OR** acetaminophen, albuterol, loperamide, morphine injection, senna-docusate   Vitals:   10/11/17 0846 10/11/17 0900  BP: (!) 154/77 140/67  Pulse: 91 99  Resp: (!) 25 (!) 21  Temp:    SpO2: 100% 100%      PHYSICAL EXAM:  General:  Thin, chronically ill-appearing female, no acute distress on vent HEENT: MM pink/moist Neuro: Awake, tracks, does not follow commands CV: s1s2 rrr, no m/r/g PULM: even/non-labored, lungs bilaterally diminished, few scattered rhonchi OZ:DGUY, non-tender, bsx4 active  Extremities: warm/dry, no significant edema  Skin: no rashes or lesions  Vent Mode: CPAP;PSV FiO2 (%):  [30 %] 30 % Set Rate:  [20 bmp] 20 bmp Vt Set:  [330 mL] 330 mL PEEP:  [5 cmH20] 5 cmH20 Pressure Support:  [5 cmH20] 5 cmH20   CBC    Component Value Date/Time   WBC 10.1 10/11/2017 0240   RBC 2.83 (L) 10/11/2017 0240   HGB 8.4 (L) 10/11/2017 0240   HCT 26.2 (L) 10/11/2017 0240   PLT 281 10/11/2017 0240   MCV 92.6 10/11/2017 0240   MCH 29.7 10/11/2017 0240   MCHC 32.1 10/11/2017 0240   RDW  17.3 (H) 10/11/2017 0240   LYMPHSABS 1.8 10/08/2017 0452   MONOABS 0.5 10/08/2017 0452   EOSABS 0.2 10/08/2017 0452   BASOSABS 0.0 10/08/2017 0452   BMP Latest Ref Rng & Units 10/11/2017 10/10/2017 10/09/2017  Glucose 65 - 99 mg/dL 167(H) 196(H) 147(H)  BUN 6 - 20 mg/dL 22(H) 20 13  Creatinine 0.44 - 1.00 mg/dL 1.06(H) 1.13(H) 1.05(H)  Sodium 135 - 145 mmol/L 140 141 142  Potassium 3.5 - 5.1 mmol/L 3.9 3.7 3.7  Chloride 101 - 111 mmol/L 116(H) 118(H)  119(H)  CO2 22 - 32 mmol/L 13(L) 13(L) 16(L)  Calcium 8.9 - 10.3 mg/dL 8.5(L) 7.9(L) 7.8(L)     Recent Labs  Lab 10/06/17 0909 10/06/17 1520 10/08/17 0327 10/11/17 0331  PHART 7.503* 7.360 7.410 7.372  PCO2ART 18.6* 32.3 23.9* 26.8*  PO2ART 61.0* 371.0* 124* 92.1  HCO3 14.6* 18.0* 14.8* 15.0*  TCO2 15* 19*  --   --   O2SAT 94.0 100.0 98.2 96.7    Dg Chest Port 1 View  Result Date: 10/11/2017 CLINICAL DATA:  Respirator dependent EXAM: PORTABLE CHEST 1 VIEW COMPARISON:  10/09/2017 FINDINGS: Endotracheal tube in good position. Feeding tube enters the stomach with the tip not visualized COPD. Left lower lobe atelectasis/ scarring unchanged. Apical scarring bilaterally unchanged Negative for heart failure IMPRESSION: Endotracheal tube remains in good position. Left lower lobe atelectasis/ infiltrate unchanged.  COPD. Electronically Signed   By: Franchot Gallo M.D.   On: 10/11/2017 07:41     12/17 CT angio head, neck 1. Occluded right cavernous ICA and occluded or severely stenotic right M1 segment. Hypertrophic lenticulostriate on the right with prompt filling of right MCA branches, a chronic appearance. The right cervical ICA is diffusely diminutive, presumably secondary. 2. Right P2 occlusion. 3. Moderate to advanced left paraclinoid ICA segment narrowing. 4. Poor visualization of the right V1 segment due to artifact and/or atheromatous narrowing. 5. Known acute infarcts in the right posterior cerebrum and right cerebellum. No hydrocephalus, acute hemorrhage, or visible progression.     12/9 echo - Left ventricle: The cavity size was normal. Wall thickness was increased in a pattern of mild LVH. Systolic function was normal. The estimated ejection fraction was in the range of 60% to 65%. Wall motion was normal; there were no regional wall motion abnormalities. Doppler parameters are consistent with abnormal left ventricular relaxation (grade 1 diastolic  dysfunction). - Mitral valve: There was mild regurgitation.   Ct brain 12/14 1. New, large area of cytotoxic edema within the posterior right hemisphere, involving the posterior parietal and temporal lobes and the occipital lobe. This includes portions of the posterior right MCA territory and the PCA territory. 2. Unchanged cytotoxic edema of the right cerebellar hemisphere with mass effect on the fourth ventricle. 3. No herniation, hydrocephalus or hemorrhage.   12/9 MRI/MRA brain 1. Acute/subacute inferior right cerebellum and vermis infarct with associated hemorrhage. 2. Additional scattered punctate foci of acute nonhemorrhagic infarct involving the right PCA territory with 1 lesion involving the right MCA territory along the right middle frontal gyrus. 3. Multiple foci of remote encephalomalacia involving the inferior posterior right temporal lobe, lateral right temporal lobe, and high right parietal lobe. 4. 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 The right A1 and likely M1 segments fill via a patent anterior communicating artery. 5. Fenestration of the vertebrobasilar junction without significant basilar artery stenosis. 6. Marked attenuation of PCA branch vessels, worse on the right. Moderate proximal right PCA stenosis. 7.  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; acute nonhemorrhagic infarct -Right PCA territory- Likely large vessel disease with high-grade terminal right ICA stenosis/occlusion and intracranial atherosclerosis  acuteEncephalopathy Stroke Risk Factors: hyperlipidemia, hypertension,CAD, PVD Other Stroke Risk Factors: Advanced age, Cigarette smoker, Migraines  PLAN -   ASA  Stroke following  Long-term BP goal 130-152 to right ICA occlusion Monitor mental status Daily wakeup assessment and  SBT TEE pending although sounds like may postpone indefinitely    AKI (acute kidney injury)   pyelonephritis/Hydronephrosis (R)  Monitor electrolytes,  replete potassium   ?Aspiration Acute Respiratory Failure secondary to aspiration and CVA.  Vent support - 8cc/kg  F/u CXR  F/u ABG Daily SBT Antibiotics as mentioned above Follow cultures Mental status barrier to extubation at this point    Hypertension Off propofol, amlodipine, labetalol due to low bp.   GI /nutrition Tube feeds Lactobacillus for diarrhea CDT negative Imodium prn  Renal, fluids, electrolytes Monitor lytes  replete K  Hematology Monitor CBC On ASA,  Endocrinology On synthroid  Fever  MSSA bacteremia Fevers ? Endocarditis vs?  Aspiration  Monitor fever curve Continue nafcillin     Trend cultures Follow-up chest x-ray Check  venous doppler - 4 limbs ?neuro fever  PICC line out 12/17 Follow repeat blood cultures  Plan new access for ng term antibx-  ID following  Plan 6 weeks abx    Lines -Devices 12/14 rectal tube ETT 12/14>>> 12/12 Gastric tube 12/9 PICC >>>12/17 12/8 Foley   No family at bedside 12/19  Goals of Care:Full code   Nickolas Madrid, NP 10/11/2017  10:01 AM Pager: [phone] or [phone]   ^^^^^^^^^^ I have seen and examined the patient. Agree with APP's Assessment and plan. Please see my comments as follows.   Awake, not very interactive,  Stable on vent; minimal ET secretions. Another fever spike 102F overnight. HD stable;  diarrhea persists. R gaze preference, No peripheral signs of endocarditis. No murmur/gallop,.  Few rales,  abd-benign. TEE cancelled per Cardiology /ID discussions;  committed to 6 wks of antimicrobials toRx  Endocarditis. cxr- left base atelectasis unchanged. No new infiltrate. Mechanical Ventilation day #5; post stroke day10; Ventilator Fi02- 40%, Peep- 5, Rate- 20, Vt- 41m; Pip-  20s No AutoPEEP. Rpt c/s negative so far. Of nafcillin per ID for MSSA bacteremia. Stroke Rx per Neurology. Fevers concerning. Would look for other causes as well.  Venous duplex ordered of all 4 limbs, though she has been on DVT prophylaxis. No evidence of sinusitis per recent cranial imaging. Pharmacy may review drugs for a cause of fever. Could be central fever following stroke. Plan to wean off vent as able to. Adding NaHCo3 as she has NAG metabolic acidosis likely  from diarrhea.    "Preliminary results by tech - Venous Duplex Lower Ext. Completed. Right leg, negative for deep and superficial vein thrombosis. Left leg, positive for acute deep vein thrombosis involving the peroneal veins. Results given to patient's nurse, KValetta Fuller Rita Sturdivant, BS, RDMS, RVT:  Right upper extremity venous duplex -  Preliminary findings: acute deep vein thrombosis in the distal right subclavian, axillary, proximal brachial veins.  Superficial vein thrombosis throughout the right cephalic and basilic veins.    --treat with full anticoagulation -IV heparin w/out bolus;  Pharmacy consulted. Monitor for Neuro changes for risk of hemorrhagic transformation;   D/w Neurology-Dr XErlinda Hongis ok with full AC---"As right MCA and right  cerebellar infarcts have been more than 7 days, AC can be considered, but recommend heparin level goal 0.3-0.4 without bolus"   Extubated this afternoon.  Monitor closely.  Has poor Peripheral iv access; has one working for now. Hopefully can delay another 24 hrs before another new CVC.  Plan for Goals of care conference with pt's husband. Prognosis for recovery??    Thank you for letting me participate in the care of your patient. ^^^^^^^^^^ I  Have personally spent    70  Minutes  In the care of this Patient providing Critical care Services; Time includes review of chart, labs, imaging, coordinating care with other physicians and healthcare team members. Also includes time for  frequent reevaluation and additional treatment implementation due to change in clinical condiiton of patient. Excludes time spent for Procedure and Teaching.  ^^^^^^^^^^ Note subject to typographical and grammatical errors;   Any formal questions or concerns about the content, text, or information contained within the body of this dictation should be directly addressed to the physician  for  clarification.  Evans Lance, MD [facility] Pager: [phone]

## 2017-10-11 NOTE — Procedures (Signed)
Extubation Procedure Note  Patient Details:   Name: Ashlee Snow DOB: [birth year] MRN: [id number]   Airway Documentation:     Evaluation  O2 sats: stable throughout Complications: No apparent complications Patient did tolerate procedure well. Bilateral Breath Sounds: Clear, Diminished   Yes   Pt extubated to 4L N/C. No stridor noted.  RN at bedside.  NIF -32.  Christophe LouisSteven D Sadia Belfiore 10/11/2017, 2:04 PM

## 2017-10-12 ENCOUNTER — Inpatient Hospital Stay (HOSPITAL_COMMUNITY): Payer: BLUE CROSS/BLUE SHIELD

## 2017-10-12 LAB — BASIC METABOLIC PANEL
Anion gap: 9 (ref 5–15)
BUN: 20 mg/dL (ref 6–20)
CO2: 17 mmol/L — ABNORMAL LOW (ref 22–32)
CREATININE: 0.81 mg/dL (ref 0.44–1.00)
Calcium: 8.8 mg/dL — ABNORMAL LOW (ref 8.9–10.3)
Chloride: 113 mmol/L — ABNORMAL HIGH (ref 101–111)
GFR calc Af Amer: >60 mL/min (ref >60)
GLUCOSE: 141 mg/dL — AB (ref 65–99)
Potassium: 3.5 mmol/L (ref 3.5–5.1)
Sodium: 139 mmol/L (ref 135–145)

## 2017-10-12 LAB — CBC
HCT: 29 % — ABNORMAL LOW (ref 36.0–46.0)
Hemoglobin: 9.6 g/dL — ABNORMAL LOW (ref 12.0–15.0)
MCH: 30.7 pg (ref 26.0–34.0)
MCHC: 33.1 g/dL (ref 30.0–36.0)
MCV: 92.7 fL (ref 78.0–100.0)
PLATELETS: 391 10*3/uL (ref 150–400)
RBC: 3.13 MIL/uL — ABNORMAL LOW (ref 3.87–5.11)
RDW: 18 % — AB (ref 11.5–15.5)
WBC: 10.7 10*3/uL — ABNORMAL HIGH (ref 4.0–10.5)

## 2017-10-12 LAB — GLUCOSE, CAPILLARY
Glucose-Capillary: 172 mg/dL — ABNORMAL HIGH (ref 65–99)
Glucose-Capillary: 177 mg/dL — ABNORMAL HIGH (ref 65–99)
Glucose-Capillary: 177 mg/dL — ABNORMAL HIGH (ref 65–99)
Glucose-Capillary: 211 mg/dL — ABNORMAL HIGH (ref 65–99)
Glucose-Capillary: 192 mg/dL — ABNORMAL HIGH (ref 65–99)
Glucose-Capillary: 163 mg/dL — ABNORMAL HIGH (ref 65–99)

## 2017-10-12 LAB — HEPARIN LEVEL (UNFRACTIONATED): Heparin Unfractionated: 0.46 IU/mL (ref 0.30–0.70)

## 2017-10-12 MED ORDER — ENOXAPARIN SODIUM 60 MG/0.6ML ~~LOC~~ SOLN
60.0000 mg | Freq: Two times a day (BID) | SUBCUTANEOUS | Status: DC
  Administered 2017-10-12 – 2017-10-18 (×14): 60 mg via SUBCUTANEOUS
  Filled 2017-10-12 (×15): qty 0.6

## 2017-10-12 MED ORDER — SODIUM BICARBONATE 650 MG PO TABS: 650.0000 mg | ORAL_TABLET | Freq: Three times a day (TID) | ORAL | Status: DC

## 2017-10-12 MED ORDER — POTASSIUM CHLORIDE CRYS ER 20 MEQ PO TBCR
20.0000 meq | EXTENDED_RELEASE_TABLET | Freq: Once | ORAL | Status: AC
  Administered 2017-10-12: 20 meq via ORAL
  Filled 2017-10-12: qty 1

## 2017-10-12 MED ORDER — SODIUM BICARBONATE 650 MG PO TABS
650.0000 mg | ORAL_TABLET | Freq: Two times a day (BID) | ORAL | Status: DC
  Administered 2017-10-12 – 2017-10-14 (×5): 650 mg via ORAL
  Filled 2017-10-12 (×5): qty 1

## 2017-10-12 NOTE — Progress Notes (Signed)
[facility]  Date of Admission:  10/07/2017            ASSESSMENT/PLAN  MSSA Bacteremia - Febrile overnight without leukocytosis. Fevers are likely related to her newly found DVTs in her right upper extremity. Blood cultures from 12/18 with no growth to date. Recommend continuing to monitor cultures and when definitely negative reconsider central line placement. Treat for 6 weeks with nafcillin for perceived endocarditis.  Right Cerebellar stroke - Neurological status appearing to wax and wane. Continues stroke reduction risk factors per neurology.   DVT - New DVTs noted in right upper extremity. May be a contributing factor to her continued fevers. Management per primary team.   We will be available as needed.    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  stroke: mapping our early stages of recovery book   Does not apply Once  . aspirin  325 mg Oral Daily  . chlorhexidine gluconate (MEDLINE KIT)  15 mL Mouth Rinse BID  . enoxaparin (LOVENOX) injection  60 mg Subcutaneous Q12H  . famotidine  20 mg Oral BID  . insulin aspart  0-15 Units Subcutaneous Q4H  . lactobacillus  1 g Oral Daily  . levothyroxine  50 mcg Intravenous Daily  . pravastatin  20 mg Oral BH-q7a  . sodium bicarbonate  650 mg Oral BID  . thiamine injection  100 mg Intravenous Daily    SUBJECTIVE:  Febrile in the last 24 hours with slightly increasing leukocytes. Continues on Naficillin. New DVTs noted in right upper extremity and started on heparin and changed to Lovenox.   Allergies  Allergen Reactions  . Ativan  [Lorazepam] Other (See Comments)    Patient's husband said related to being oversedated not true allergy     Review of Systems: Review of Systems  Unable to perform ROS: Medical condition      OBJECTIVE: Vitals:   10/12/17 0900 10/12/17 1000 10/12/17 1100 10/12/17 1200  BP: 123/80 (!) 157/84 (!) 155/68 (!) 154/62  Pulse: 97 98 94 88  Resp: '17 15 17 16  ' Temp:      TempSrc:      SpO2: 97% 100% 99% 95%  Weight:      Height:       Body mass index is 23.92 kg/m.  Physical Exam  Constitutional: She is well-developed, well-nourished, and in no distress. No distress.  Lying in bed;  Cardiovascular: Normal rate, regular rhythm, normal heart sounds and intact distal pulses. Exam reveals no gallop and no friction rub.  No murmur heard. Pulmonary/Chest: Effort normal and breath sounds normal. No respiratory distress. She has no wheezes. She has no rales. She exhibits no tenderness.  Breathing sounds with rhonchi. Concern for aspiration risk  Abdominal: Soft. She exhibits no distension.  Neurological:  Moving extremities but does not follow commands.  Skin: Skin is warm and dry.    Lab Results Lab Results  Component Value Date   WBC 10.7 (H) 10/12/2017   HGB 9.6 (L) 10/12/2017   HCT 29.0 (L) 10/12/2017   MCV 92.7 10/12/2017  PLT 391 10/12/2017    Lab Results  Component Value Date   CREATININE 0.81 10/12/2017   BUN 20 10/12/2017   NA 139 10/12/2017   K 3.5 10/12/2017   CL 113 (H) 10/12/2017   CO2 17 (L) 10/12/2017    Lab Results  Component Value Date   ALT 22 10/08/2017   AST 74 (H) 10/08/2017   ALKPHOS 64 10/08/2017   BILITOT 0.7 10/08/2017     Microbiology: Recent Results (from the past 240 hour(s))  Culture, blood (routine x 2)     Status: None   Collection Time: 10/03/17  9:40 AM  Result Value Ref Range Status   Specimen Description BLOOD LEFT HAND  Final   Special Requests IN PEDIATRIC BOTTLE Blood Culture adequate volume  Final   Culture NO GROWTH 5  DAYS  Final   Report Status 10/08/2017 FINAL  Final  Culture, blood (routine x 2)     Status: None   Collection Time: 10/03/17  9:50 AM  Result Value Ref Range Status   Specimen Description BLOOD LEFT HAND  Final   Special Requests IN PEDIATRIC BOTTLE Blood Culture adequate volume  Final   Culture NO GROWTH 5 DAYS  Final   Report Status 10/08/2017 FINAL  Final  C difficile quick scan w PCR reflex     Status: None   Collection Time: 10/06/17 10:05 AM  Result Value Ref Range Status   C Diff antigen NEGATIVE NEGATIVE Final   C Diff toxin NEGATIVE NEGATIVE Final   C Diff interpretation No C. difficile detected.  Final  Culture, Urine     Status: None   Collection Time: 10/06/17 11:04 AM  Result Value Ref Range Status   Specimen Description URINE, CATHETERIZED  Final   Special Requests Normal  Final   Culture NO GROWTH  Final   Report Status 10/07/2017 FINAL  Final  Culture, respiratory (NON-Expectorated)     Status: None   Collection Time: 10/06/17  2:31 PM  Result Value Ref Range Status   Specimen Description TRACHEAL ASPIRATE  Final   Special Requests Normal  Final   Gram Stain   Final    MODERATE WBC PRESENT, PREDOMINANTLY MONONUCLEAR NO ORGANISMS SEEN    Culture NO GROWTH 2 DAYS  Final   Report Status 10/09/2017 FINAL  Final  Culture, blood (routine x 2)     Status: None (Preliminary result)   Collection Time: 10/10/17 12:20 PM  Result Value Ref Range Status   Specimen Description BLOOD LEFT HAND  Final   Special Requests IN PEDIATRIC BOTTLE Blood Culture adequate volume  Final   Culture NO GROWTH 1 DAY  Final   Report Status PENDING  Incomplete  Culture, blood (routine x 2)     Status: None (Preliminary result)   Collection Time: 10/10/17 12:28 PM  Result Value Ref Range Status   Specimen Description BLOOD LEFT HAND  Final   Special Requests IN PEDIATRIC BOTTLE Blood Culture adequate volume  Final   Culture NO GROWTH 1 DAY  Final   Report Status PENDING  Incomplete      Terri Piedra, NP [facility] [phone] Pager  10/12/2017  12:19 PM

## 2017-10-12 NOTE — Progress Notes (Signed)
STROKE TEAM PROGRESS NOTE   SUBJECTIVE (INTERVAL HISTORY) Pt RN at bedside. Pt was extubated yesterday. Found to have RUE and RLE acute DVT. On heparin IV, will change to lovenox due to difficult IV.    OBJECTIVE Lab Results: CBC:  Recent Labs  Lab 10/10/17 0359 10/11/17 0240 10/12/17 0319  WBC 9.4 10.1 10.7*  HGB 8.4* 8.4* 9.6*  HCT 26.5* 26.2* 29.0*  MCV 93.3 92.6 92.7  PLT 182 281 391   BMP: Recent Labs  Lab 10/16/2017 1709  10/06/17 0621 10/06/17 2020  10/08/17 0452 10/09/17 0500 10/10/17 0359 10/11/17 0240 10/12/17 0319  NA  --    < > 157* 158*   < > 149* 142 141 140 139  K  --    < > 5.2* 3.1*   < > 3.8 3.7 3.7 3.9 3.5  CL  --    < > >130* >130*   < > 124* 119* 118* 116* 113*  CO2  --    < > 18* 19*   < > 16* 16* 13* 13* 17*  GLUCOSE  --    < > 563* 118*   < > 229* 147* 196* 167* 141*  BUN  --    < > 23* 20   < > 18 13 20  22* 20  CREATININE  --    < > 1.43* 1.36*   < > 1.23* 1.05* 1.13* 1.06* 0.81  CALCIUM  --    < > 7.0* 7.5*   < > 7.7* 7.8* 7.9* 8.5* 8.8*  MG 2.2  --  2.1 2.1  --   --   --   --  2.0  --   PHOS 2.8  --   --   --   --   --   --   --  2.1*  --    < > = values in this interval not displayed.   Liver Function Tests:  Recent Labs  Lab 10/08/17 0452  AST 74*  ALT 22  ALKPHOS 64  BILITOT 0.7  PROT 5.3*  ALBUMIN 2.2*   No results for input(s): AMMONIA in the last 168 hours. Cardiac Enzymes:  No results for input(s): CKTOTAL, CKMB, CKMBINDEX, TROPONINI in the last 168 hours. Coagulation Studies:  No results for input(s): APTT, INR in the last 72 hours. PHYSICAL EXAM Temp:  [97.8 F (36.6 C)-100.8 F (38.2 C)] 98.2 F (36.8 C) (12/20 0800) Pulse Rate:  [73-98] 94 (12/20 1100) Resp:  [9-31] 17 (12/20 1100) BP: (105-157)/(56-94) 155/68 (12/20 1100) SpO2:  [97 %-100 %] 99 % (12/20 1100) Weight:  [139 lb 5.3 oz (63.2 kg)] 139 lb 5.3 oz (63.2 kg) (12/20 0500)  General - Well nourished, well developed, not in acute distress  Ophthalmologic  - Fundi not visualized due to noncooperation.  Cardiovascular - Regular rate and rhythm.  Neuro - awake alert, eyes spontaneously open, orientated to place, name, age but not to time. Able to name and repeat but severe dysarthria. PERRL, not blinking to visual threat bilaterally, eyes moving both directions, left facial droop. RUE and RLE spontaneous movement against gravity movement, LUE 2/5 on pain with mild extension and LLE 3-/5 withdraw on pain. DTR 1+ and positive babinski bilaterally. Sensation, coordination not cooperative and gait not tested.   IMAGING: I have personally reviewed the radiological images below and agree with the radiology interpretations.  Ct Head Wo Contrast 10/01/2017 IMPRESSION: No significant change since yesterday. Acute infarction affecting the right cerebellum with swelling. Mass-effect upon  the fourth ventricle but no change in size the lateral or third ventricles at this time. No hemorrhage. 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 Electronically Signed   By: Paulina FusiMark  Shogry M.D.   On: 10/01/2017 07:45   MRI/MRA Brain Wo Contrast 10/01/2017 IMPRESSION: 1. Acute/subacute inferior right cerebellum and vermis infarct with associated hemorrhage. 2. Additional scattered punctate foci of acute nonhemorrhagic infarct involving the right PCA territory with 1 lesion involving the right MCA territory along the right middle frontal gyrus. 3. Multiple foci of remote encephalomalacia involving the inferior posterior right temporal lobe, lateral right temporal lobe, and high right parietal lobe. 4. 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 The right A1 and likely M1 segments fill via a patent anterior communicating artery. 5. Fenestration of the vertebrobasilar junction without significant basilar artery stenosis. 6. Marked attenuation of PCA branch vessels, worse on the right. Moderate proximal right PCA stenosis. 7. Fetal  type left posterior cerebral artery.   Echocardiogram:  - Left ventricle: The cavity size was normal. Wall thickness was   increased in a pattern of mild LVH. Systolic function was normal.   The estimated ejection fraction was in the range of 60% to 65%.   Wall motion was normal; there were no regional wall motion   abnormalities. Doppler parameters are consistent with abnormal   left ventricular relaxation (grade 1 diastolic dysfunction). - Mitral valve: There was mild regurgitation.  B/L Carotid U/S:                                                Right ECA and ICA waveforms are very similar. Unable to interrogate stenosis.  40-59% left ICA stenosis. Bilateral vertebral artery flow is antegrade.   CT Head: 10/02/17                                                    1. No significant change in the appearance of acute to subacute, hemorrhagic right inferior cerebellar infarct with slight positive mass effect on the adjacent fourth ventricle. No dilatation the third or lateral ventricles. 2. Chronic right parietal, posteromedial temporal and occipital lobe infarcts as well as right periventricular white matter tract lacunar infarct.   EEG 10/02/17:   This normal EEG is recorded in the waking state     Ct Head Wo Contrast 10/06/2017 IMPRESSION: 1. New, large area of cytotoxic edema within the posterior right hemisphere, involving the posterior parietal and temporal lobes and the occipital lobe. This includes portions of the posterior right MCA territory and the PCA territory. 2. Unchanged cytotoxic edema of the right cerebellar hemisphere with mass effect on the fourth ventricle. 3. No herniation, hydrocephalus or hemorrhage.   Ct Angio Head and neck W Or Wo Contrast 10/09/2017 IMPRESSION: 1. Occluded right cavernous ICA and occluded or severely stenotic right M1 segment. Hypertrophic lenticulostriate on the right with prompt filling of right MCA branches, a chronic appearance. The right  cervical ICA is diffusely diminutive, presumably secondary. 2. Right P2 occlusion. 3. Moderate to advanced left paraclinoid ICA segment narrowing. 4. Poor visualization of the right V1 segment due to artifact and/or atheromatous narrowing. 5. Known acute infarcts in the right posterior  cerebrum and right cerebellum. No hydrocephalus, acute hemorrhage, or visible progression.   LE venous doppler - Right leg, negative for deep and superficial vein thrombosis. Left leg, positive for acute deep vein thrombosis involving the peroneal veins  UE venous doppler - acute deep vein thrombosis in the distal right subclavian, axillary, proximal brachial veins.  Superficial vein thrombosis throughout the right cephalic and basilic veins.  Somewhat difficult exam due to patient position and cooperation                ASSESSMENT: Ms. Ashlee Snow is a 61 y.o. female with PMH of prior hypertension, coronary artery disease, hypothyroidism, chronic pain, depression, smoker and peripheral arterial disease status post aorto-iliac bypass in June admitted for fever, confusion, right flank pain. Found to have right pyelonephritis and hydronephrosis. Also MRI showed right cerebellar large infarcts with right MCA scattered infarcts. B Cx showed MSSA. On Abx. However, repeat CT head on 10/06/17 showed new large right MCA infarcts. Concerning for endocarditis.   Acute, large Right cerebellar stroke with petechial hemorrhagic conversion as well as acute large right MCA and PCA infarct - concerning for endocarditis, but also found to have High-grade stenosis or occlusion of the cavernous right internal carotid artery.    Resultant - intubated on sedation, left hemiparesis  CT head 10/02/17 right cerebellar large infarct  MRI head 10/01/17 right cerebellar large infarct and right MCA scattered small infarcts. Old right PCA, right caudate, right parietal high convexity  MRA head - right ICA cavernous high grade stenosis vs.  Occlusion, b/l M1 stenosis, right PCA distal stenosis  CT head 10/06/17 - new large right MCA and PCA infarct with cytotoxic edema  CTA head and neck - right ICA occlusion, right M1 high grade stenosis vs. Occlusion, right P2 occlusion and right V1 high grade stenosis, no mycotic aneurysms  CUS - waveform consistent with right ICA distal stenosis vs. Occlusion  TTE EF 60-65%, no vegetation  Consider TEE to rule out endocarditis and also evaluate PFO once pt more medically stable - ? Next Monday  Heparin subq for DVT prophylaxis  NPO for now  No antithrombotics PTA, now on ASA 325mg .  Therapy recommendation - pending  Deposition - pending  Sepsis with pyelonephritis and bacteremia - ? Endocarditis  Intermittent high grade fever with low BP  Admitted for pyelonephritis with hydronephrosis confirmed on CT  B Cx positive for MSSA 10/03/17, repeat culture neg  Urine culture neg  Sputum culture neg  On nafcillin  Due to recurrent stroke, concerning for endocarditis  Will consider TEE once pt more medically stable - ? Next Monday  Hematuria   Continued hematuria  H&H stable - Hb 8.4->9.6  Continue monitoring  UA 10/06/17 - RBC TNTC, WBC 6-30  CCM on board  Right LE and UE acute DVT  venous doppler confirmed RLE and RUE DVT  Discussed with Dr. Odessa Fleming, was on heparin IV but agree to change to lovenox due to IV access  Consider DOACs once medically stable and no planned procedure  Will need TEE or TCD bubble study later to evaluate PFO  AKI, resolved  Cre 1.64->2.01->1.36->1.73->1.23->1.05->1.13->1.06->0.81  On IVF and TF  CCM on board  Continue to monitor  Intracranial atherosclerosis and stenosis:  MRA showed right ICA distal occlusion, b/l M1 stenosis, right PCA distal stenosis  CTA head and neck - right ICA occlusion, right M1 high grade stenosis vs. Occlusion, right P2 occlusion and right V1 high grade stenosis,  CUS consistent with  right ICA  distal stenosis or occlusion  On ASA now  May consider DAPT on discharge if pt hematuria improves  Will consider cerebral angiogram to further assess right ICA and MCA occlusion/stenosis  Avoid hypotension  HTN / hypotension  Stable now  D/c'ed amlodipine  Resume TF and continue IVF  BP goal 130-150 due to right ICA occlusion  Avoid hypotension  HYPERLIPIDEMIA  Home Meds:  Pravachol 20 mg  LDL 46,  goal < 70  AST 183->74  ALT 72->22  Resumed pravachol 20  TOBACCO ABUSE  Current smoker  Smoking cessation counseling when applicable  Nicotine patch PRN  Other Active Problems:  Hypernatremia - resolved  Hyperglycemia - improved  Hospital day # 12  This patient is critically ill due to recurrent stroke, sepsis, fever, bacteremia, AKI, intubated, DVT and at significant risk of neurological worsening, death form recurrent stroke, hemorrhagic conversion, septic shock, seizure, endocarditis, kidney failure. This patient's care requires constant monitoring of vital signs, hemodynamics, respiratory and cardiac monitoring, review of multiple databases, neurological assessment, discussion with family, other specialists and medical decision making of high complexity. I had long discussion with husband at bedside, updated pt current condition, treatment plan and potential prognosis. He expressed understanding and appreciation. I discussed also with Dr. Sherren Kerns over the phone. I spent 35 minutes of neurocritical care time in the care of this patient.  Marvel Plan, MD PhD Stroke Neurology 10/12/2017 12:09 PM   To contact Stroke Continuity provider, please refer to [email or web address]. After hours, contact General Neurology

## 2017-10-12 NOTE — Progress Notes (Signed)
OT Cancellation Note  Patient Details Name: Ashlee Snow MRN: [id number] DOB: [birth year]   Cancelled Treatment:    Reason Eval/Treat Not Completed: Medical issues which prohibited therapy(Heparin stopped with new DVT)  Gaye AlkenBailey A Marquiz Sotelo M.S., OTR/L Pager: [phone]  10/12/2017, 11:10 AM

## 2017-10-12 NOTE — Progress Notes (Signed)
ANTICOAGULATION CONSULT NOTE  Pharmacy Consult for Lovenox Indication: DVT  Allergies  Allergen Reactions  . Ativan [Lorazepam] Other (See Comments)    Patient's husband said related to being oversedated not true allergy    Patient Measurements: Height: 5\' 4"  (162.6 cm) Weight: 139 lb 5.3 oz (63.2 kg) IBW/kg (Calculated) : 54.7 Heparin Dosing Weight: 63.9 kg  Vital Signs: Temp: 98.2 F (36.8 C) (12/20 0800) Temp Source: Oral (12/20 0800) BP: 157/84 (12/20 1000) Pulse Rate: 98 (12/20 1000)  Labs: Recent Labs    10/10/17 0359 10/11/17 0240 10/11/17 2046 10/12/17 0319  HGB 8.4* 8.4*  --  9.6*  HCT 26.5* 26.2*  --  29.0*  PLT 182 281  --  391  HEPARINUNFRC  --   --  0.68 0.46  CREATININE 1.13* 1.06*  --  0.81    Estimated Creatinine Clearance: 64.6 mL/min (by C-G formula based on SCr of 0.81 mg/dL).   Medical History: Past Medical History:  Diagnosis Date  . Asthma   . Bronchitis   . DDD (degenerative disc disease)   . Depression   . Dyspnea    on exertion  . Fibromyalgia   . GERD (gastroesophageal reflux disease)    hx. of   . History of depression   . Hypertension   . Hypothyroidism   . Leg pain   . Migraine   . Peripheral arterial disease (HCC)   . Peripheral vascular disease (HCC)   . Sciatica   . Stroke (cerebrum) (HCC) 09/27/2017  . Thyroid disease   . 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 Ulcer     Assessment: 61 yo female s/p R MCA and cerebellar stroke around 12/8 with mass effect and currently being treated for mssa bacteremia. Found to have L lower extremity DVT. Patient was initially started on heparin yesterday and will now be transitioned to Lovenox.   Goal of Therapy:  Anti-Xa 0.6-1 units/mL Monitor platelets by anticoagulation protocol: Yes    Plan:  -Stop heparin -Give Lovenox 1 hour after Lovenox has been turned off -Lovenox 60 mg [place] q12h -F/u plan for oral anticoagulation -Plan  discussed with RN   Iley Deignan, Darl HouseholderAlison M 10/12/2017,10:26 AM

## 2017-10-12 NOTE — Progress Notes (Signed)
Nutrition Follow-up  INTERVENTION:   Continue:  Vital AFf 1.2 @ 60 cc/hr (1440 ml/day) via Cortrak Provides 1728 kcals, 108 gm protein, 1168 ml free water daily  NUTRITION DIAGNOSIS:   Inadequate oral intake related to inability to eat as evidenced by NPO status. Ongoing.   GOAL:   Patient will meet greater than or equal to 90% of their needs Met.   MONITOR:   Diet advancement, I & O's  ASSESSMENT:   Pt with PMH of fibromyalgia, HTN, hypothyroidism who was recent having N/V and treated for urosepsis. 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  Pt discussed during ICU rounds and with RN.  12/19 pt extubated, Cortrak 12/12 Extensive DVT in RUE Diarrhea on loperamide On abx for presumed endocarditis Pt is positive 10 L since admission  Medications reviewed and include: lactinex, sodium bicarb, thiamine, synthroid Labs reviewed CBG's: [phone]  TF: Vital AF 1.2 @ 60 ml/hr (1728 kcal, 108 grams protein, and 1167 ml free water)   Diet Order:  Diet NPO time specified  EDUCATION NEEDS:   No education needs have been identified at this time  Skin:  Skin Assessment: (skin tear to L buttocks)  Last BM:  240 ml via flexiseal x 24 hrs  Height:   Ht Readings from Last 1 Encounters:  10/06/17 '5\' 4"'  (1.626 m)    Weight:   Wt Readings from Last 1 Encounters:  10/12/17 139 lb 5.3 oz (63.2 kg)    Ideal Body Weight:  54.5 kg  BMI:  Body mass index is 23.92 kg/m.  Estimated Nutritional Needs:   Kcal:  1600-1800  Protein:  75-95 grams  Fluid:  >2 Liters  Maylon Peppers RD, LDN, CNSC [phone] Pager [phone] After Hours Pager

## 2017-10-12 NOTE — Progress Notes (Signed)
PT Cancellation Note  Patient Details Name: Ashlee Snow A Hassinger MRN: [id number] DOB: [birth year]   Cancelled Treatment:    Reason Eval/Treat Not Completed: Medical issues which prohibited therapy(Heparin stopped with new DVT and per RN MD does not want pt to work with therapies today)   Enedina FinnerMaija B Amaro Mangold 10/12/2017, 10:11 AM  Delaney MeigsMaija Tabor Dody Smartt, PT [phone]

## 2017-10-12 NOTE — Progress Notes (Signed)
ANTICOAGULATION CONSULT NOTE - Follow Up Consult  Pharmacy Consult for heparin Indication: DVT in setting of CVA  Labs: Recent Labs    10/09/17 0500 10/10/17 0359 10/11/17 0240 10/11/17 2046 10/12/17 0319  HGB 8.4* 8.4* 8.4*  --  9.6*  HCT 25.9* 26.5* 26.2*  --  29.0*  PLT 173 182 281  --  391  HEPARINUNFRC  --   --   --  0.68 0.46  CREATININE 1.05* 1.13* 1.06*  --   --     Assessment: 61yo female remains slightly above low, tight goal after heparin rate change.  Goal of Therapy:  Heparin level 0.3-0.4 units/ml   Plan:  Will decrease heparin gtt slightly to 800 units/hr and check level in 6hr.  Vernard GamblesVeronda Kimberl Snow, PharmD, BCPS  10/12/2017,4:09 AM

## 2017-10-12 NOTE — Progress Notes (Signed)
CRITICAL CARE PROGRESS NOTE     Name:Ashlee Snow JOA:[id number] DOB:[birth year]  ADMISSION DATE:10/10/2017    post stroke day12  HISTORY  This is a 61 year old with a past medical history of fibromyalgia, hypertension, and hypothyroidism who for several days prior to admission had apparently been suffering from nausea and vomiting. She subsequently had fevers and was evaluated at an outside hospital where she was found to have left hydronephrosis. A ureteral stent was placed and she was treated for urosepsis with antibiotics and generous fluids. She has grown MSSA from her blood.  [facility] urine sample &culture was contaminated,so we have no culture of her urine on presentation..CT scan of the head has shown a cerebellar infarction.  Her oxygen requirements increased substantially on 12/10 and Levaquin was added to nafcillin to cover for presumed aspiration. Zosyn was subsequently substituted for both. Her oxygen requirements have improved, but her mental status has not. Today she is febrile. She is having liquid stool.   Subjective: Extubated 12/19 afternoon; no problems since. No fever. Extensive DVT RUE (PICC removed)- on IV heparin Has only one PIV access Diarrhea resolving- on loperamide Had upper airway secretions and coughing    Scheduled Meds: .  stroke: mapping our early stages of recovery book   Does not apply Once  . aspirin  325 mg Oral Daily  . chlorhexidine gluconate (MEDLINE KIT)  15 mL Mouth Rinse BID  . famotidine  20 mg Oral BID  . insulin aspart  0-15 Units Subcutaneous Q4H  . lactobacillus  1 g Oral Daily  . levothyroxine  50 mcg Intravenous Daily  . pravastatin  20 mg Oral BH-q7a  . sodium bicarbonate  650 mg Oral BID  . thiamine injection  100 mg Intravenous Daily   Continuous Infusions: . feeding supplement (VITAL AF 1.2 CAL) 1,000 mL (10/12/17 0800)  . nafcillin IV Stopped (10/12/17 0800)   PRN  Meds:.acetaminophen **OR** acetaminophen (TYLENOL) oral liquid 160 mg/5 mL **OR** acetaminophen, albuterol, fentaNYL (SUBLIMAZE) injection, loperamide, morphine injection, senna-docusate    Vitals:   10/12/17 0700 10/12/17 0800  BP: (!) 145/70 (!) 155/68  Pulse: 94 95  Resp: 19 14  Temp:    SpO2: 100% 97%     PHYSICAL EXAM:  General:  Thin, chronically ill-appearing female, no acute distress on vent HEENT: MM pink/moist Neuro: Awake, tracks, does not follow commands, R gaze preference, CV: s1s2 rrr, no m/r/g PULM: even/non-labored, lungs bilaterally diminished, few scattered rhonchi SW:FUXN, non-tender, bsx4 active  Extremities: warm/dry, no significant edema /tenderness Skin: no rashes or lesions  No peripheral signs of endocarditis.   CBC    Component Value Date/Time   WBC 10.7 (H) 10/12/2017 0319   RBC 3.13 (L) 10/12/2017 0319   HGB 9.6 (L) 10/12/2017 0319   HCT 29.0 (L) 10/12/2017 0319   PLT 391 10/12/2017 0319   MCV 92.7 10/12/2017 0319   MCH 30.7 10/12/2017 0319   MCHC 33.1 10/12/2017 0319   RDW 18.0 (H) 10/12/2017 0319   LYMPHSABS 1.8 10/08/2017 0452   MONOABS 0.5 10/08/2017 0452   EOSABS 0.2 10/08/2017 0452   BASOSABS 0.0 10/08/2017 0452   BMP Latest Ref Rng & Units 10/12/2017 10/11/2017 10/10/2017  Glucose 65 - 99 mg/dL 141(H) 167(H) 196(H)  BUN 6 - 20 mg/dL 20 22(H) 20  Creatinine 0.44 - 1.00 mg/dL 0.81 1.06(H) 1.13(H)  Sodium 135 - 145 mmol/L 139 140 141  Potassium 3.5 - 5.1 mmol/L 3.5 3.9 3.7  Chloride 101 - 111 mmol/L 113(H) 116(H)  118(H)  CO2 22 - 32 mmol/L 17(L) 13(L) 13(L)  Calcium 8.9 - 10.3 mg/dL 8.8(L) 8.5(L) 7.9(L)    12/17 CT angio head, neck 1. Occluded right cavernous ICA and occluded or severely stenotic right M1 segment. Hypertrophic lenticulostriate on the right with prompt filling of right MCA branches, a chronic appearance. The right cervical ICA is diffusely diminutive, presumably secondary. 2. Right P2 occlusion. 3.  Moderate to advanced left paraclinoid ICA segment narrowing. 4. Poor visualization of the right V1 segment due to artifact and/or atheromatous narrowing. 5. Known acute infarcts in the right posterior cerebrum and right cerebellum. No hydrocephalus, acute hemorrhage, or visible progression.   10-13-19 CXR Band of atelectasis at the left base, stable. Large lung volumes. Normal heart size. Interval tracheal extubation. A feeding tube at least reaches the stomach. No visible effusion or pneumothorax.   12/9 echo - Left ventricle: The cavity size was normal. Wall thickness was increased in a pattern of mild LVH. Systolic function was normal. The estimated ejection fraction was in the range of 60% to 65%. Wall motion was normal; there were no regional wall motion abnormalities. Doppler parameters are consistent with abnormal left ventricular relaxation (grade 1 diastolic dysfunction). - Mitral valve: There was mild regurgitation.   Ct brain 12/14 1. New, large area of cytotoxic edema within the posterior right hemisphere, involving the posterior parietal and temporal lobes and the occipital lobe. This includes portions of the posterior right MCA territory and the PCA territory. 2. Unchanged cytotoxic edema of the right cerebellar hemisphere with mass effect on the fourth ventricle. 3. No herniation, hydrocephalus or hemorrhage.   12/9 MRI/MRA brain 1. Acute/subacute inferior right cerebellum and vermis infarct with associated hemorrhage. 2. Additional scattered punctate foci of acute nonhemorrhagic infarct involving the right PCA territory with 1 lesion involving the right MCA territory along the right middle frontal gyrus. 3. Multiple foci of remote encephalomalacia involving the inferior posterior right temporal lobe, lateral right temporal lobe, and high right parietal lobe. 4. 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  The right A1 and likely M1 segments fill via a patent anterior communicating artery. 5. Fenestration of the vertebrobasilar junction without significant basilar artery stenosis. 6. Marked attenuation of PCA branch vessels, worse on the right. Moderate proximal right PCA stenosis. 7. Fetal type left posterior cerebral artery.Assessment- Plan:  12/19 Sono "Preliminary results by tech - Venous Duplex Lower Ext. Completed. Right leg, negative for deep and superficial vein thrombosis. Left leg, positive for acute deep vein thrombosis involving the peroneal veins.  Right upper extremity venous duplex- Preliminary findings: acute deep vein thrombosis in the distal right subclavian, axillary, proximal brachial veins. Superficial vein thrombosis throughout the right cephalic and basilic veins.      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 S/p extubation 12/19  - -DVT -RUE, LLE- sec to PICC RUE & immobility  PLAN -    Neurology_ ASA /  May consider DAPT on discharge if pt condition improves  Long-term BP goal 130-150 to right ICA occlusion Monitor mental statusLong term BP goal130-150 due  to right ICA occlusion Rehab if clinically stable   Cardiology- HD stable  Pulmonary Pulmonary toileting, CPT, Nebs GI /nutrition Tube feeds Lactobacillus for diarrhea CDT negative Imodium  prn  Renal, fluids, electrolytes Monitor lytes  Added NaHCo3 as she has NAG metabolic acidosis likely  from diarrhea.   Hematology Monitor CBC --treat with full anticoagulation -heparin>>lovenox change today Pharmacy consulted. Monitor for Neuro changes for risk of hemorrhagic transformation;   D/w Neurology-Dr Erlinda Hong is ok with full AC---"As right MCA and right cerebellar infarcts have been more than 7 days, AC can be considered, but recommend heparin level goal 0.3-0.4 without bolus"    Endocrinology On synthroid  ID MSSA bacteremia Fevers ? Endocarditis vs?  DVTs Monitor fever curve Continue nafcillin     Trend cultures Follow-up chest x-ray No evidence of sinusitis per recent cranial imaging. Pharmacy may review drugs for a cause of fever. Could be central fever following stroke. PICC line out 12/17 Follow repeat blood cultures  Plan new access for long term antibx- when c/s negative >48 hrs ID following  Plan 6 weeks abx    Lines -Devices 12/14 rectal tube ETT 12/14>>>12/19 12/12 Gastric tube 12/9 PICC >>>12/17 12/8 Foley   Goals of Care:Full code   Has poor Peripheral iv access; has one working for now. Hopefully can delay till rpt c/s negative  before  Placing a new CVC.    Plan for Goals of care conference with pt's husband. Prognosis for recovery??    Thank you for letting me participate in the care of your patient.    ^^^^^^^^^^ I  Have personally spent    65  Minutes  In the care of this Patient providing Critical care Services; Time includes review of chart, labs, imaging, coordinating care with other physicians and healthcare team members. Also includes time for frequent reevaluation and additional treatment implementation due to change in clinical condiiton of patient. Excludes time spent for Procedure and Teaching.  ^^^^^^^^^^ Note subject to typographical and grammatical errors;   Any formal questions or concerns about the  content, text, or information contained within the body of this dictation should be directly addressed to the physician  for  clarification.  Evans Lance, MD Pulmonary and [place] Pager: [phone]

## 2017-10-13 ENCOUNTER — Inpatient Hospital Stay (HOSPITAL_COMMUNITY): Payer: BLUE CROSS/BLUE SHIELD

## 2017-10-13 LAB — BASIC METABOLIC PANEL
Anion gap: 9 (ref 5–15)
BUN: 20 mg/dL (ref 6–20)
CALCIUM: 8.3 mg/dL — AB (ref 8.9–10.3)
CO2: 18 mmol/L — ABNORMAL LOW (ref 22–32)
Chloride: 112 mmol/L — ABNORMAL HIGH (ref 101–111)
Creatinine, Ser: 0.82 mg/dL (ref 0.44–1.00)
Glucose, Bld: 190 mg/dL — ABNORMAL HIGH (ref 65–99)
Potassium: 2.8 mmol/L — ABNORMAL LOW (ref 3.5–5.1)
SODIUM: 139 mmol/L (ref 135–145)

## 2017-10-13 LAB — CBC
HCT: 25.4 % — ABNORMAL LOW (ref 36.0–46.0)
Hemoglobin: 8.3 g/dL — ABNORMAL LOW (ref 12.0–15.0)
MCH: 29.9 pg (ref 26.0–34.0)
MCHC: 32.7 g/dL (ref 30.0–36.0)
MCV: 91.4 fL (ref 78.0–100.0)
PLATELETS: 476 10*3/uL — AB (ref 150–400)
RBC: 2.78 MIL/uL — ABNORMAL LOW (ref 3.87–5.11)
RDW: 18 % — AB (ref 11.5–15.5)
WBC: 8.9 10*3/uL (ref 4.0–10.5)

## 2017-10-13 LAB — GLUCOSE, CAPILLARY
GLUCOSE-CAPILLARY: 199 mg/dL — AB (ref 65–99)
GLUCOSE-CAPILLARY: 152 mg/dL — AB (ref 65–99)
Glucose-Capillary: 215 mg/dL — ABNORMAL HIGH (ref 65–99)
Glucose-Capillary: 195 mg/dL — ABNORMAL HIGH (ref 65–99)
Glucose-Capillary: 157 mg/dL — ABNORMAL HIGH (ref 65–99)
Glucose-Capillary: 189 mg/dL — ABNORMAL HIGH (ref 65–99)

## 2017-10-13 MED ORDER — INSULIN ASPART 100 UNIT/ML ~~LOC~~ SOLN
2.0000 [IU] | Freq: Four times a day (QID) | SUBCUTANEOUS | Status: DC
  Administered 2017-10-13 – 2017-10-22 (×36): 2 [IU] via SUBCUTANEOUS

## 2017-10-13 MED ORDER — VITAMIN B-1 100 MG PO TABS
100.0000 mg | ORAL_TABLET | Freq: Every day | ORAL | Status: DC
  Administered 2017-10-13 – 2017-10-14 (×2): 100 mg via ORAL
  Filled 2017-10-13 (×2): qty 1

## 2017-10-13 MED ORDER — POTASSIUM CHLORIDE CRYS ER 20 MEQ PO TBCR
20.0000 meq | EXTENDED_RELEASE_TABLET | Freq: Once | ORAL | Status: AC
Start: 2017-10-13 — End: 2017-10-13
  Administered 2017-10-13: 20 meq via ORAL
  Filled 2017-10-13: qty 1

## 2017-10-13 MED ORDER — POTASSIUM CHLORIDE CRYS ER 20 MEQ PO TBCR
40.0000 meq | EXTENDED_RELEASE_TABLET | Freq: Once | ORAL | Status: AC
  Administered 2017-10-13: 40 meq via ORAL
  Filled 2017-10-13: qty 2

## 2017-10-13 NOTE — Progress Notes (Signed)
Occupational Therapy Treatment Patient Details Name: Ashlee Snow A Vig MRN: [id number] DOB: July 26, 1956 Today's Date: 10/13/2017    History of present illness This is a 61 year old with a past medical history of fibromyalgia, HTN, and hypothyroidism . 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 Additional scattered punctate foci of acute nonhemorrhagic infarct involving the right PCA territory with 1 lesion involving the right MCA territory along the right middle frontal gyrus. Multiple foci of remote encephalomalacia involving the inferior posterior right temporal lobe, lateral right temporal lobe, and high right parietal lobe. Pt intubated 12/14 and extubated 12/19. On 12/20 found to have LLE + for acute DVT in the peroneal veins; RUE acute DVT in the distal right subclavian, axillary, proximal brachial veins.  Superficial vein thrombosis throughout the right cephalic and basilic veins.   OT comments  This 61 yo female admitted and found to have above presents to acute OT awake and alert with minimal command  following, no spontaneous AROM noted in LUE but will withdraw and say it hurts to nail bed pressure on left hand,  spontaneously moves all other extremities, head and eye turns to midline with verbal and tactile cues--but only once instance of past midline in todays session. Feel she will benefit from LUE resting hand splint for tone and decreased attention to RUE (2 hours on/2 hours off) and RN is working on getting this.   Follow Up Recommendations  CIR;Supervision/Assistance - 24 hour    Equipment Recommendations  Other (comment)(TBD at next venue)    Recommendations for Other Services Rehab consult    Precautions / Restrictions Precautions Precautions: Fall Precaution Comments: Cortrak, ETT, vent, flexiseal, foley, LLE DVT, RUE DVT Restrictions Weight  Bearing Restrictions: No       Mobility Bed Mobility Overal bed mobility: Needs Assistance Bed Mobility: Rolling Rolling: (total A to right, max A to left)                   ADL either performed or assessed with clinical judgement   ADL Overall ADL's : Needs assistance/impaired Eating/Feeding: NPO   Grooming: Wash/dry face;Bed level Grooming Details (indicate cue type and reason): Gave pt a warm washcloth in RUE and she did not take it to face; hand over hand to help her get to face--once there she did not attempt to wash face and with hand over hand she resisted. When given [place] she did put it in her mouth and move it around.                                      Vision Baseline Vision/History: Wears glasses Additional Comments: continues with right gaze preference with being able to get to midline when prompeted and one time past midline. No nystagumus noted today          Cognition Arousal/Alertness: Awake/alert Behavior During Therapy: Flat affect   Area of Impairment: Following commands;Safety/judgement;Problem solving                       Following Commands: Follows one step commands inconsistently;Follows one step commands with increased time Safety/Judgement: Decreased awareness of safety;Decreased awareness of deficits   Problem Solving: Slow processing;Decreased initiation;Difficulty sequencing;Requires verbal cues;Requires tactile cues          Exercises Other Exercises Other Exercises: Noted tone  in mild tone in LUE. Feel pt could benefit from resting hand splint from [employer]--2 hours on and 2 hours off (RN made aware and she is putting in order)           Pertinent Vitals/ Pain       Pain Assessment: Faces Faces Pain Scale: Hurts even more Pain Location: husband reports chronic low back pain. Pt did say "that hurts" when I did nail bed pressure to thumb and then pinky (as well as moving arm away from painful  stimuli) Pain Descriptors / Indicators: Grimacing Pain Intervention(s): Limited activity within patient's tolerance;Repositioned         Frequency  Min 3X/week        Progress Toward Goals  OT Goals(current goals can now be found in the care plan section)  Progress towards OT goals: Not progressing toward goals - comment     Plan Discharge plan remains appropriate       AM-PAC PT "6 Clicks" Daily Activity     Outcome Measure   Help from another person eating meals?: Total Help from another person taking care of personal grooming?: Total Help from another person toileting, which includes using toliet, bedpan, or urinal?: Total Help from another person bathing (including washing, rinsing, drying)?: Total Help from another person to put on and taking off regular upper body clothing?: Total Help from another person to put on and taking off regular lower body clothing?: Total 6 Click Score: 6    End of Session    OT Visit Diagnosis: Other abnormalities of gait and mobility (R26.89);Muscle weakness (generalized) (M62.81);Other symptoms and signs involving the nervous system (R29.898);Other symptoms and signs involving cognitive function;Cognitive communication deficit (R41.841);Hemiplegia and hemiparesis;Pain;Low vision, both eyes (H54.2) Symptoms and signs involving cognitive functions: Cerebral infarction Hemiplegia - Right/Left: Left Hemiplegia - dominant/non-dominant: Non-Dominant Hemiplegia - caused by: Cerebral infarction Pain - Right/Left: Right Pain - part of body: (back and LUE with nail bed pressure)   Activity Tolerance Patient tolerated treatment well   Patient Left in bed;with bed alarm set   Nurse Communication (would benefit from resting hand splint for LUE)        Time: 1610-9604 OT Time Calculation (min): 32 min  Charges: OT General Charges $OT Visit: 1 Visit OT Treatments $Self Care/Home Management : 8-22 mins $Therapeutic Activity: 8-22  mins  Ignacia Palma, OTR/L [phone] 10/13/2017

## 2017-10-13 NOTE — Progress Notes (Signed)
STROKE TEAM PROGRESS NOTE   SUBJECTIVE (INTERVAL HISTORY) Pt RN at bedside. Pt tolerating extubation well. Awake alert, still has right gaze preference but able to cross midline. RUE plegic but RLE at least 3-/5. Still severe dysarthria but able to repeat and tell me her age. Follows simple commands on the right. On TF will have speech come back.    OBJECTIVE Lab Results: CBC:  Recent Labs  Lab 10/11/17 0240 10/12/17 0319 10/13/17 0554  WBC 10.1 10.7* 8.9  HGB 8.4* 9.6* 8.3*  HCT 26.2* 29.0* 25.4*  MCV 92.6 92.7 91.4  PLT 281 391 476*   BMP: Recent Labs  Lab 10/06/17 2020  10/09/17 0500 10/10/17 0359 10/11/17 0240 10/12/17 0319 10/13/17 0554  NA 158*   < > 142 141 140 139 139  K 3.1*   < > 3.7 3.7 3.9 3.5 2.8*  CL >130*   < > 119* 118* 116* 113* 112*  CO2 19*   < > 16* 13* 13* 17* 18*  GLUCOSE 118*   < > 147* 196* 167* 141* 190*  BUN 20   < > 13 20 22* 20 20  CREATININE 1.36*   < > 1.05* 1.13* 1.06* 0.81 0.82  CALCIUM 7.5*   < > 7.8* 7.9* 8.5* 8.8* 8.3*  MG 2.1  --   --   --  2.0  --   --   PHOS  --   --   --   --  2.1*  --   --    < > = values in this interval not displayed.   Liver Function Tests:  Recent Labs  Lab 10/08/17 0452  AST 74*  ALT 22  ALKPHOS 64  BILITOT 0.7  PROT 5.3*  ALBUMIN 2.2*   No results for input(s): AMMONIA in the last 168 hours. Cardiac Enzymes:  No results for input(s): CKTOTAL, CKMB, CKMBINDEX, TROPONINI in the last 168 hours. Coagulation Studies:  No results for input(s): APTT, INR in the last 72 hours. PHYSICAL EXAM Temp:  [97.6 F (36.4 C)-100.1 F (37.8 C)] 100.1 F (37.8 C) (12/21 0800) Pulse Rate:  [88-102] 96 (12/21 0900) Resp:  [15-32] 19 (12/21 0900) BP: (125-159)/(56-110) 145/83 (12/21 0900) SpO2:  [95 %-100 %] 96 % (12/21 0900) Weight:  [133 lb 9.6 oz (60.6 kg)] 133 lb 9.6 oz (60.6 kg) (12/21 0500)  General - Well nourished, well developed, not in acute distress  Ophthalmologic - Fundi not visualized due to  noncooperation.  Cardiovascular - Regular rate and rhythm.  Neuro - awake alert, eyes spontaneously open, orientated to place and age but not to time. Able to repeat but severe dysarthria. PERRL, not blinking to visual threat on the left, eyes right gaze preference but able to cross midline, left facial droop. Did not follow commands with tongue protrusion. RUE and RLE spontaneous movement against gravity movement, LUE mild extension and LLE 3-/5 withdraw on pain. DTR 1+ and positive babinski bilaterally. Sensation, coordination not cooperative and gait not tested.   IMAGING: I have personally reviewed the radiological images below and agree with the radiology interpretations.  Ct Head Wo Contrast 10/01/2017 IMPRESSION: No significant change since yesterday. Acute infarction affecting the right cerebellum with swelling. Mass-effect upon the fourth ventricle but no change in size the lateral or third ventricles at this time. No hemorrhage. 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 Electronically Signed   By: Paulina Fusi M.D.   On: 10/01/2017 07:45   MRI/MRA Brain Wo Contrast 10/01/2017 IMPRESSION: 1.  Acute/subacute inferior right cerebellum and vermis infarct with associated hemorrhage. 2. Additional scattered punctate foci of acute nonhemorrhagic infarct involving the right PCA territory with 1 lesion involving the right MCA territory along the right middle frontal gyrus. 3. Multiple foci of remote encephalomalacia involving the inferior posterior right temporal lobe, lateral right temporal lobe, and high right parietal lobe. 4. 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 The right A1 and likely M1 segments fill via a patent anterior communicating artery. 5. Fenestration of the vertebrobasilar junction without significant basilar artery stenosis. 6. Marked attenuation of PCA branch vessels, worse on the right. Moderate proximal right PCA  stenosis. 7. Fetal type left posterior cerebral artery.   Echocardiogram:  - Left ventricle: The cavity size was normal. Wall thickness was   increased in a pattern of mild LVH. Systolic function was normal.   The estimated ejection fraction was in the range of 60% to 65%.   Wall motion was normal; there were no regional wall motion   abnormalities. Doppler parameters are consistent with abnormal   left ventricular relaxation (grade 1 diastolic dysfunction). - Mitral valve: There was mild regurgitation.  B/L Carotid U/S:                                                Right ECA and ICA waveforms are very similar. Unable to interrogate stenosis.  40-59% left ICA stenosis. Bilateral vertebral artery flow is antegrade.   CT Head: 10/02/17                                                    1. No significant change in the appearance of acute to subacute, hemorrhagic right inferior cerebellar infarct with slight positive mass effect on the adjacent fourth ventricle. No dilatation the third or lateral ventricles. 2. Chronic right parietal, posteromedial temporal and occipital lobe infarcts as well as right periventricular white matter tract lacunar infarct.   EEG 10/02/17:   This normal EEG is recorded in the waking state     Ct Head Wo Contrast 10/06/2017 IMPRESSION: 1. New, large area of cytotoxic edema within the posterior right hemisphere, involving the posterior parietal and temporal lobes and the occipital lobe. This includes portions of the posterior right MCA territory and the PCA territory. 2. Unchanged cytotoxic edema of the right cerebellar hemisphere with mass effect on the fourth ventricle. 3. No herniation, hydrocephalus or hemorrhage.   Ct Angio Head and neck W Or Wo Contrast 10/09/2017 IMPRESSION: 1. Occluded right cavernous ICA and occluded or severely stenotic right M1 segment. Hypertrophic lenticulostriate on the right with prompt filling of right MCA branches, a chronic  appearance. The right cervical ICA is diffusely diminutive, presumably secondary. 2. Right P2 occlusion. 3. Moderate to advanced left paraclinoid ICA segment narrowing. 4. Poor visualization of the right V1 segment due to artifact and/or atheromatous narrowing. 5. Known acute infarcts in the right posterior cerebrum and right cerebellum. No hydrocephalus, acute hemorrhage, or visible progression.   LE venous doppler - Right leg, negative for deep and superficial vein thrombosis. Left leg, positive for acute deep vein thrombosis involving the peroneal veins  UE venous doppler - acute deep vein thrombosis in  the distal right subclavian, axillary, proximal brachial veins.  Superficial vein thrombosis throughout the right cephalic and basilic veins.  Somewhat difficult exam due to patient position and cooperation                ASSESSMENT: Ms. Ashlee Snow is a 61 y.o. female with PMH of prior hypertension, coronary artery disease, hypothyroidism, chronic pain, depression, smoker and peripheral arterial disease status post aorto-iliac bypass in June admitted for fever, confusion, right flank pain. Found to have right pyelonephritis and hydronephrosis. Also MRI showed right cerebellar large infarcts with right MCA scattered infarcts. B Cx showed MSSA. On Abx. However, repeat CT head on 10/06/17 showed new large right MCA infarcts. Concerning for endocarditis.   Acute, large Right cerebellar stroke with petechial hemorrhagic conversion as well as acute large right MCA and PCA infarct - concerning for endocarditis, but also found to have High-grade stenosis or occlusion of the cavernous right internal carotid artery.    Resultant - intubated on sedation, left hemiparesis  CT head 10/02/17 right cerebellar large infarct  MRI head 10/01/17 right cerebellar large infarct and right MCA scattered small infarcts. Old right PCA, right caudate, right parietal high convexity  MRA head - right ICA cavernous high  grade stenosis vs. Occlusion, b/l M1 stenosis, right PCA distal stenosis  CT head 10/06/17 - new large right MCA and PCA infarct with cytotoxic edema  CTA head and neck - right ICA occlusion, right M1 high grade stenosis vs. Occlusion, right P2 occlusion and right V1 high grade stenosis, no mycotic aneurysms  CUS - waveform consistent with right ICA distal stenosis vs. Occlusion  TTE EF 60-65%, no vegetation  Consider TEE to rule out endocarditis and also evaluate PFO - will shoot for Next Monday  Heparin subq for DVT prophylaxis  NPO for now  No antithrombotics PTA, now on ASA 325mg .  Therapy recommendation - pending  Deposition - pending  Sepsis with pyelonephritis and bacteremia - ? Endocarditis  Intermittent high grade fever with low BP  Admitted for pyelonephritis with hydronephrosis confirmed on CT  B Cx positive for MSSA 10/03/17, repeat culture neg  Urine culture neg  Sputum culture neg  On nafcillin  Due to recurrent stroke, concerning for endocarditis   consider TEE - shoot for Next Monday  Hematuria   Continued hematuria  H&H stable - Hb 8.4->9.6  Continue monitoring  UA 10/06/17 - RBC TNTC, WBC 6-30  CCM on board  Right LE and UE acute DVT  venous doppler confirmed RLE and RUE DVT  Discussed with Dr. Odessa FlemingSivakuma, was on heparin IV but agree to change to lovenox due to IV access  Consider DOACs once medically stable and no planned procedure  Will need TEE or TCD bubble study later to evaluate PFO  AKI, resolved  Cre 1.64->2.01->1.36->1.73->1.23->1.05->1.13->1.06->0.81  On IVF and TF  CCM on board  Continue to monitor  Intracranial atherosclerosis and stenosis:  MRA showed right ICA distal occlusion, b/l M1 stenosis, right PCA distal stenosis  CTA head and neck - right ICA occlusion, right M1 high grade stenosis vs. Occlusion, right P2 occlusion and right V1 high grade stenosis,  CUS consistent with right ICA distal stenosis or  occlusion  On ASA now  May consider DAPT on discharge if pt hematuria improves  Will consider cerebral angiogram to further assess right ICA and MCA occlusion/stenosis once more stabilized  Avoid hypotension  HTN / hypotension  Stable now  D/c'ed amlodipine  Resume TF and continue  IVF  BP goal 130-150 due to right ICA occlusion  Avoid hypotension  HYPERLIPIDEMIA  Home Meds:  Pravachol 20 mg  LDL 46,  goal < 70  AST 183->74  ALT 72->22  Resumed pravachol 20  TOBACCO ABUSE  Current smoker  Smoking cessation counseling when applicable  Nicotine patch PRN  Other Active Problems:  Hypernatremia - resolved  Hyperglycemia - improved  Hospital day # 13  This patient is critically ill due to recurrent stroke, sepsis, fever, bacteremia, AKI, intubated, DVT and at significant risk of neurological worsening, death form recurrent stroke, hemorrhagic conversion, septic shock, seizure, endocarditis, kidney failure. This patient's care requires constant monitoring of vital signs, hemodynamics, respiratory and cardiac monitoring, review of multiple databases, neurological assessment, discussion with family, other specialists and medical decision making of high complexity. I had long discussion with husband at bedside, updated pt current condition, treatment plan and potential prognosis. He expressed understanding and appreciation. I discussed also with Dr. Sherren Kerns in the hallway. I spent 35 minutes of neurocritical care time in the care of this patient.  Marvel Plan, MD PhD Stroke Neurology 10/13/2017 9:49 AM   To contact Stroke Continuity provider, please refer to [email or web address]. After hours, contact General Neurology

## 2017-10-13 NOTE — Progress Notes (Signed)
Patient's O2 dropping into the 80's. Respiratory and MD at the bedside. Nonrebreather applied at this time. Pt has received Chest PT today around 1400. Monitoring closely. Monya Kozakiewicz, [place] ScrapeSarah E, RN

## 2017-10-13 NOTE — Progress Notes (Signed)
Inpatient Diabetes Program Recommendations  AACE/ADA: New Consensus Statement on Inpatient Glycemic Control (2015)  Target Ranges:  Prepandial:   less than 140 mg/dL      Peak postprandial:   less than 180 mg/dL (1-2 hours)      Critically ill patients:  140 - 180 mg/dL   Lab Results  Component Value Date   GLUCAP 215 (H) 10/13/2017   HGBA1C 6.3 (H) 10/01/2017    Review of Glycemic ControlResults for Ashlee Snow, Ashlee Snow (MRN [id number]) as of 10/13/2017 08:36  Ref. Range 10/12/2017 19:50 10/12/2017 23:26 10/13/2017 03:27 10/13/2017 07:24  Glucose-Capillary Latest Ref Range: 65 - 99 mg/dL 409192 (H) 811163 (H) 914199 (H) 215 (H)   Diabetes history: None noted Outpatient Diabetes medications: None Current orders for Inpatient glycemic control:  Novolog moderate q 4 hours Vital 60 cc/hr Inpatient Diabetes Program Recommendations:   Note that blood sugars>goal with tube feeds.  Consider adding Novolog tube feed coverage 2 units q 4 hours- (D/C once tube feeds are stopped).   Thanks,  Beryl MeagerJenny Caryl Fate, RN, BC-ADM Inpatient Diabetes Coordinator Pager [phone] (8a-5p)

## 2017-10-13 NOTE — Progress Notes (Signed)
Increased patient from 2 LPM to 3 LPM [facility] per desat 88% after CPT. Patient now 91%. RT will continue to monitor.

## 2017-10-13 NOTE — Progress Notes (Signed)
Orthopedic Tech Progress Note Patient Details:  Leane ParaDebra A Mcelhiney May 26, 1956 [id number]  Patient ID: Leane Paraebra A Marcou, female   DOB: May 26, 1956, 61 y.o.   MRN: [id number]   Saul FordyceJennifer C Tajuana Kniskern 10/13/2017, 2:04 PMCalled [employer] for left Resting hand splint.

## 2017-10-13 NOTE — Significant Event (Addendum)
  Called to bedside by RN .  Problem: Hypoxia 02 requirement 2lit >5 lit [place] this afternoon. Now  Sp02- 80-85%  Evaluation:     Clinical findings:  Vitals:p-100 bp-140/70 Awake, Weak cough Breath sounds diminished in left base, No  Rales/rhonchi     Assessment-Plan:  Likely retained secretions>atelectasis>VQ mismatch>Hypoxia  May be central hypoventilation due to CVA.  PE possible due to extensive DVT/ but less likely as pt is on [place] lovenox.   1. Obtain CXR 2. Increased to NRB mask 02 3. CPT and albuterol nebs to continue 4.  Continue ICU care; cancelled transfer to stepdown.   Sp02 improved to 97% after a good cough; weaned 02- Venturi mask.   Additional CCTime spent- 35 minutes Excludes procedure/teaching.    Caryl ComesSiva P Zechariah Bissonnette, MD Pulmonary and Critical Care Medicine [facility] Pager: [phone]    Chest xray done- no change in LLL discoid atelectasis; enteric tube>>Stomach. No new abnormality seen.  SPS 3.50pm 10-13-17

## 2017-10-13 NOTE — Evaluation (Signed)
Clinical/Bedside Swallow Evaluation Patient Details  Name: Leane ParaDebra A Swartzentruber MRN: [id number] Date of Birth: 1955/12/29  Today's Date: 10/13/2017 Time: SLP Start Time (ACUTE ONLY): 1115 SLP Stop Time (ACUTE ONLY): 1136 SLP Time Calculation (min) (ACUTE ONLY): 21 min  Past Medical History:  Past Medical History:  Diagnosis Date  . Asthma   . Bronchitis   . DDD (degenerative disc disease)   . Depression   . Dyspnea    on exertion  . Fibromyalgia   . GERD (gastroesophageal reflux disease)    hx. of   . History of depression   . Hypertension   . Hypothyroidism   . Leg pain   . Migraine   . Peripheral arterial disease (HCC)   . Peripheral vascular disease (HCC)   . Sciatica   . Stroke (cerebrum) (HCC) 10/19/2017  . Thyroid disease   . 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 Ulcer    Past Surgical History:  Past Surgical History:  Procedure Laterality Date  . ABDOMINAL HYSTERECTOMY    . AORTA - BILATERAL FEMORAL ARTERY BYPASS GRAFT Bilateral 04/14/2017   Procedure: AORTA BIFEMORAL BYPASS GRAFT WITH HEMASHIELD GOLD BIFURCATED 14MMX7 MM GRAFT;  Surgeon: Nada LibmanBrabham, Vance W, MD;  Location: [facility] OR;  Service: Vascular;  Laterality: Bilateral;  . aortogram w/ stenting  2007, 2011, Aug. 2012  . BACK SURGERY  2016   lumbar surgery  . CHOLECYSTECTOMY  01/22/1997   Gall Bladder  . COLON SURGERY  1982   exploratory surgery  . COLONOSCOPY WITH ESOPHAGOGASTRODUODENOSCOPY (EGD)  2010   Dr. Jena Gaussourk: EGD with small hiatal hernia, small bowel biopsy negative, colonoscopy with hyperplastic polyp   . MANDIBLE FRACTURE SURGERY     TMJ surgery - right side  . OVARIAN CYST SURGERY    . TUBAL LIGATION     HPI:  This is a 61 year old with a past medical history of fibromyalgia, hypertension, and hypothyroidism who for several days prior to admission had apparently been suffering from  Confusion, fevers, N/V, right flank pain,. Found to have Severe sepsis secondary  to pyelonephritis with ureteral obstruction and MRI: Acute/subacute inferior right cerebellum and vermis infarct with associated hemorrhage. Additional scattered punctate foci of acute nonhemorrhagic infarct involving the right PCA territory with 1 lesion involving the right MCA territory along the right middle frontal gyrus. Multiple foci of remote encephalomalacia involving the inferior posterior right temporal lobe, lateral right temporal lobe, and high right parietal lobe.  Pt intubated 12/14-12/19 due to increased oxygen requirement.  Repeat BSE ordered now that pt is extubated.    Assessment / Plan / Recommendation Clinical Impression   Pt presents with wet, congested cough prior to administration of POs which is indicative of ongoing issues managing her secretions.  Pt needs max assist to focus her attention to therapist as well as to accept ice chips and attend to bolus.  Her pharyngeal swallow subjectively appears weak and delayed; however,  no cough was evident after administration of ice chips which is concerning for sensory deficits post CVA and/or intubation given that pt's respirations and vocalizations were audibly wet throughout assessment.  Continue to recommend NPO with alternative means of nutrition and aggressive ST follow up while inpatient SLP Visit Diagnosis: Dysphagia, oropharyngeal phase (R13.12)    Aspiration Risk  Severe aspiration risk;Risk for inadequate nutrition/hydration    Diet Recommendation NPO;Alternative means - temporary   Medication Administration: Via alternative means    Other  Recommendations Oral Care Recommendations:  Oral care prior to ice chip/H20   Follow up Recommendations Inpatient Rehab      Frequency and Duration min 3x week          Prognosis Prognosis for Safe Diet Advancement: Good Barriers to Reach Goals: Cognitive deficits;Severity of deficits      Swallow Study   General HPI: This is a 61 year old with a past medical history of  fibromyalgia, hypertension, and hypothyroidism who for several days prior to admission had apparently been suffering from  Confusion, fevers, N/V, right flank pain,. Found to have Severe sepsis secondary to pyelonephritis with ureteral obstruction and MRI: Acute/subacute inferior right cerebellum and vermis infarct with associated hemorrhage. Additional scattered punctate foci of acute nonhemorrhagic infarct involving the right PCA territory with 1 lesion involving the right MCA territory along the right middle frontal gyrus. Multiple foci of remote encephalomalacia involving the inferior posterior right temporal lobe, lateral right temporal lobe, and high right parietal lobe Type of Study: Bedside Swallow Evaluation Previous Swallow Assessment: BSE, 12/10 rec NPO Diet Prior to this Study: NPO Temperature Spikes Noted: Yes Respiratory Status: Nasal cannula History of Recent Intubation: Yes Length of Intubations (days): 6 days Date extubated: 10/11/17 Behavior/Cognition: Alert;Lethargic/Drowsy;Distractible;Requires cueing Oral Care Completed by SLP: Yes Oral Cavity - Dentition: Adequate natural dentition Vision: Impaired for self-feeding Self-Feeding Abilities: Total assist Patient Positioning: Upright in bed Baseline Vocal Quality: Wet Volitional Cough: Cognitively unable to elicit(reflexive cough weak and congested) Volitional Swallow: Unable to elicit    Oral/Motor/Sensory Function Overall Oral Motor/Sensory Function: Within functional limits   Ice Chips Ice chips: Impaired Presentation: Spoon Pharyngeal Phase Impairments: Suspected delayed Swallow   Thin Liquid      Nectar Thick     Honey Thick     Puree     Solid   GO            Felesia Stahlecker, Joni ReiningNicole L 10/13/2017,11:47 AM

## 2017-10-13 NOTE — Progress Notes (Signed)
ANTICOAGULATION CONSULT NOTE  Pharmacy Consult for Lovenox Indication: DVT  Allergies  Allergen Reactions  . Ativan [Lorazepam] Other (See Comments)    Patient's husband said related to being oversedated not true allergy    Patient Measurements: Height: 5\' 4"  (162.6 cm) Weight: 133 lb 9.6 oz (60.6 kg) IBW/kg (Calculated) : 54.7 Heparin Dosing Weight: 63.9 kg  Vital Signs: Temp: 97.6 F (36.4 C) (12/21 0400) Temp Source: Oral (12/21 0400) BP: 157/80 (12/21 0700) Pulse Rate: 95 (12/21 0700)  Labs: Recent Labs    10/11/17 0240 10/11/17 2046 10/12/17 0319 10/13/17 0554  HGB 8.4*  --  9.6* 8.3*  HCT 26.2*  --  29.0* 25.4*  PLT 281  --  391 476*  HEPARINUNFRC  --  0.68 0.46  --   CREATININE 1.06*  --  0.81 0.82    Estimated Creatinine Clearance: 63.8 mL/min (by C-G formula based on SCr of 0.82 mg/dL).   Medical History: Past Medical History:  Diagnosis Date  . Asthma   . Bronchitis   . DDD (degenerative disc disease)   . Depression   . Dyspnea    on exertion  . Fibromyalgia   . GERD (gastroesophageal reflux disease)    hx. of   . History of depression   . Hypertension   . Hypothyroidism   . Leg pain   . Migraine   . Peripheral arterial disease (HCC)   . Peripheral vascular disease (HCC)   . Sciatica   . Stroke (cerebrum) (HCC) 09/28/2017  . Thyroid disease   . 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 Ulcer     Assessment: 61 yo female s/p R MCA and cerebellar stroke around 12/8 with mass effect and currently being treated for mssa bacteremia. Found to have L lower extremity DVT. Patient was initially started on heparin but was transitioned to Lovenox due to IV issues. H/H trending down, Plt up to 476. SCr stable at 0.82   Goal of Therapy:  Anti-Xa 0.6-1 units/mL Monitor platelets by anticoagulation protocol: Yes    Plan:  -Lovenox 60 mg (1 mg/kg)  q12h -F/u plan for oral anticoagulation   Vinnie LevelBenjamin  Rie Mcneil, PharmD., BCPS Clinical Pharmacist Pager [phone]

## 2017-10-13 NOTE — Progress Notes (Signed)
CRITICAL CARE PROGRESS NOTE   ADMISSION DATE:10/07/2017    post stroke day12  HISTORY  This is a 61 year old with a past medical history of fibromyalgia, hypertension, and hypothyroidism who for several days prior to admission had apparently been suffering from nausea and vomiting. She subsequently had fevers and was evaluated at an outside hospital where she was found to have left hydronephrosis. A ureteral stent was placed and she was treated for urosepsis with antibiotics and generous fluids. She has grown MSSA from her blood.  [facility] urine sample &culture was contaminated,so we have no culture of her urine on presentation..CT scan of the head has shown a cerebellar infarction.  Her oxygen requirements increased substantially on 12/10 and Levaquin was added to nafcillin to cover for presumed aspiration. Zosyn was subsequently substituted for both. Her oxygen requirements have improved, but her mental status has not. Today she is febrile. She is having liquid stool.   Subjective:  Extubated 12/19 afternoon; tolerating well. Had a low grade fever. Mild hematuria noted Extensive DVT RUE (PICC removed)- on IV heparin Has only one PIV access Diarrhea resolving- on loperamide More awake and responsive   Vitals:   10/13/17 0700 10/13/17 0800  BP: (!) 157/80 (!) 156/56  Pulse: 95   Resp: (!) 26   Temp:  100.1 F (37.8 C)  SpO2: 98%      Scheduled Meds: .  stroke: mapping our early stages of recovery book   Does not apply Once  . aspirin  325 mg Oral Daily  . chlorhexidine gluconate (MEDLINE KIT)  15 mL Mouth Rinse BID  . enoxaparin (LOVENOX) injection  60 mg Subcutaneous Q12H  . famotidine  20 mg Oral BID  . insulin aspart  0-15 Units Subcutaneous Q4H  . insulin aspart  2 Units Subcutaneous Q6H  . lactobacillus  1 g Oral Daily  . levothyroxine  50 mcg Intravenous Daily  . potassium chloride  20 mEq Oral Once  . pravastatin  20 mg Oral BH-q7a   . sodium bicarbonate  650 mg Oral BID  . thiamine  100 mg Oral Daily   Continuous Infusions: . feeding supplement (VITAL AF 1.2 CAL) 1,000 mL (10/13/17 0800)  . nafcillin IV Stopped (10/13/17 0739)   PRN Meds:.acetaminophen **OR** acetaminophen (TYLENOL) oral liquid 160 mg/5 mL **OR** acetaminophen, albuterol, fentaNYL (SUBLIMAZE) injection, loperamide, morphine injection    PHYSICAL EXAM:  General:Thin, chronicallyill-appearing female, no acute distress   HEENT: MM pink/moist Neuro:Awake, tracks, does not follow commands, R gaze preference, CV: s1s2 rrr, no m/r/g PULM: even/non-labored, lungs bilaterallydiminished, few scattered rhonchi WG:NFAO, non-tender, bsx4 active  Extremities: warm/dry,no significantedema /tenderness Skin: no rashes or lesions  No peripheral signs of endocarditis.   CBC Latest Ref Rng & Units 10/13/2017 10/12/2017 10/11/2017  WBC 4.0 - 10.5 K/uL 8.9 10.7(H) 10.1  Hemoglobin 12.0 - 15.0 g/dL 8.3(L) 9.6(L) 8.4(L)  Hematocrit 36.0 - 46.0 % 25.4(L) 29.0(L) 26.2(L)  Platelets 150 - 400 K/uL 476(H) 391 281     BMP Latest Ref Rng & Units 10/13/2017 10/12/2017 10/11/2017  Glucose 65 - 99 mg/dL 190(H) 141(H) 167(H)  BUN 6 - 20 mg/dL 20 20 22(H)  Creatinine 0.44 - 1.00 mg/dL 0.82 0.81 1.06(H)  Sodium 135 - 145 mmol/L 139 139 140  Potassium 3.5 - 5.1 mmol/L 2.8(L) 3.5 3.9  Chloride 101 - 111 mmol/L 112(H) 113(H) 116(H)  CO2 22 - 32 mmol/L 18(L) 17(L) 13(L)  Calcium 8.9 - 10.3 mg/dL 8.3(L) 8.8(L) 8.5(L)      12/17  CT angio head, neck 1. Occluded right cavernous ICA and occluded or severely stenotic right M1 segment. Hypertrophic lenticulostriate on the right with prompt filling of right MCA branches, a chronic appearance. The right cervical ICA is diffusely diminutive, presumably secondary. 2. Right P2 occlusion. 3. Moderate to advanced left paraclinoid ICA segment narrowing. 4. Poor visualization of the right V1 segment due to  artifact and/or atheromatous narrowing. 5. Known acute infarcts in the right posterior cerebrum and right cerebellum. No hydrocephalus, acute hemorrhage, or visible progression.   10-13-19 CXR Band of atelectasis at the left base, stable. Large lung volumes. Normal heart size. Interval tracheal extubation. A feeding tube at least reaches the stomach. No visible effusion or pneumothorax.   12/9 echo - Left ventricle: The cavity size was normal. Wall thickness was increased in a pattern of mild LVH. Systolic function was normal. The estimated ejection fraction was in the range of 60% to 65%. Wall motion was normal; there were no regional wall motion abnormalities. Doppler parameters are consistent with abnormal left ventricular relaxation (grade 1 diastolic dysfunction). - Mitral valve: There was mild regurgitation.   Ct brain 12/14 1. New, large area of cytotoxic edema within the posterior right hemisphere, involving the posterior parietal and temporal lobes and the occipital lobe. This includes portions of the posterior right MCA territory and the PCA territory. 2. Unchanged cytotoxic edema of the right cerebellar hemisphere with mass effect on the fourth ventricle. 3. No herniation, hydrocephalus or hemorrhage.   12/9 MRI/MRA brain 1. Acute/subacute inferior right cerebellum and vermis infarct with associated hemorrhage. 2. Additional scattered punctate foci of acute nonhemorrhagic infarct involving the right PCA territory with 1 lesion involving the right MCA territory along the right middle frontal gyrus. 3. Multiple foci of remote encephalomalacia involving the inferior posterior right temporal lobe, lateral right temporal lobe, and high right parietal lobe. 4. 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 The right A1 and likely M1 segments fill via a patent anterior communicating artery. 5. Fenestration of the  vertebrobasilar junction without significant basilar artery stenosis. 6. Marked attenuation of PCA branch vessels, worse on the right. Moderate proximal right PCA stenosis. 7. Fetal type left posterior cerebral artery.Assessment- Plan:  12/19 Sono  Right: Acute deep vein thrombosis in the distal right subclavian, axillary, brachial veins. Superficial vein thrombosis throughout the right cephalic and basilic veins.   Left: There is evidence of acute DVT in the Peroneal vein.    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 S/p extubation 12/19  - -DVT -RUE, LLE- sec to PICC RUE & immobility  PLAN -    Neurology_ ASA /  May consider DAPT on discharge if pt condition improves  Long-term BP goal 130-150 to right ICA occlusion Monitor mental status  Rehab when clinically stable   Cardiology- HD stable  Pulmonary Pulmonary toileting, CPT, Nebs  GI /nutrition Tube feeds Lactobacillus for diarrhea CDT negative Imodium prn  Renal, fluids, electrolytes Monitor lytes  Added NaHCo3 as she has NAG metabolic acidosis likely from diarrhea.  Hematology Monitor CBC --treat with full anticoagulation-heparin>>lovenox change  today Pharmacy consulted. Monitor for Neuro changes for risk of hemorrhagic transformation;  D/wNeurology-Dr Erlinda Hong is ok with full AC---"As right MCA and right  cerebellar infarcts have been more than 7 days, AC can be considered, but recommend heparin level goal 0.3-0.4 without bolus"   If hematuria gets worse, we may have to stop Lovenox.  IVC filter may only be partially protective. She would also need SVC filter if unable to anticoagulate.    Endocrinology On synthroid  ID MSSA bacteremia Fevers ? Endocarditisvs? DVTs Monitor fever curve Continue nafcillin  Trend cultures Follow-up chest x-ray periodically No evidence of sinusitis per recent cranial imaging.   PICC line out 12/17 Follow repeat blood cultures Plan new access for long term antibx-when c/s negative > 5 days per ID Reconsult ID PRN- f/u ID prior to discharge Plan 6 weeks abx   Lines -Devices 12/14 rectal tube ETT12/14>>>12/19 12/12 Gastric tube 12/9 PICC>>>12/17 12/8 Foley   Goals of Care:Full code  May be transferred to stepdown unit today. D/w Neurology.   PCCM signing off.   Please  Reconsult PCCM again if needed. Thank you.  Thank you for letting me participate in the care of your patient.    ^^^^^^^^^^ I Have personally spent 55 Minutes In the care of this Patient providing Critical care Services; Time includes review of chart, labs, imaging, coordinating care with other physicians and healthcare team members. Also includes time for frequent reevaluation and additional treatment implementation due to change in clinical condiiton of patient. Excludes time spent for Procedure and Teaching.  ^^^^^^^^^^ Note subject to typographical and grammatical errors;  Any formal questions or concerns about the content, text, or information contained within the body of this dictation should be directly addressed to the physician for clarification.  Evans Lance, MD Pulmonary and [place] Pager: [phone]

## 2017-10-14 LAB — GLUCOSE, CAPILLARY
GLUCOSE-CAPILLARY: 145 mg/dL — AB (ref 65–99)
Glucose-Capillary: 168 mg/dL — ABNORMAL HIGH (ref 65–99)
Glucose-Capillary: 161 mg/dL — ABNORMAL HIGH (ref 65–99)
Glucose-Capillary: 185 mg/dL — ABNORMAL HIGH (ref 65–99)
Glucose-Capillary: 148 mg/dL — ABNORMAL HIGH (ref 65–99)
Glucose-Capillary: 148 mg/dL — ABNORMAL HIGH (ref 65–99)

## 2017-10-14 LAB — CBC
HEMATOCRIT: 26.3 % — AB (ref 36.0–46.0)
HEMOGLOBIN: 8.7 g/dL — AB (ref 12.0–15.0)
MCH: 30.6 pg (ref 26.0–34.0)
MCHC: 33.1 g/dL (ref 30.0–36.0)
MCV: 92.6 fL (ref 78.0–100.0)
Platelets: 514 10*3/uL — ABNORMAL HIGH (ref 150–400)
RBC: 2.84 MIL/uL — AB (ref 3.87–5.11)
RDW: 18.7 % — ABNORMAL HIGH (ref 11.5–15.5)
WBC: 7.8 10*3/uL (ref 4.0–10.5)

## 2017-10-14 LAB — BASIC METABOLIC PANEL
ANION GAP: 10 (ref 5–15)
BUN: 20 mg/dL (ref 6–20)
CHLORIDE: 113 mmol/L — AB (ref 101–111)
CO2: 17 mmol/L — ABNORMAL LOW (ref 22–32)
Calcium: 8.2 mg/dL — ABNORMAL LOW (ref 8.9–10.3)
Creatinine, Ser: 0.78 mg/dL (ref 0.44–1.00)
GFR calc Af Amer: >60 mL/min (ref >60)
GFR calc non Af Amer: >60 mL/min (ref >60)
GLUCOSE: 182 mg/dL — AB (ref 65–99)
POTASSIUM: 3.3 mmol/L — AB (ref 3.5–5.1)
Sodium: 140 mmol/L (ref 135–145)

## 2017-10-14 MED ORDER — FREE WATER
100.0000 mL | Freq: Three times a day (TID) | Status: DC
  Administered 2017-10-14 – 2017-10-22 (×22): 100 mL

## 2017-10-14 MED ORDER — SODIUM BICARBONATE 650 MG PO TABS
650.0000 mg | ORAL_TABLET | Freq: Two times a day (BID) | ORAL | Status: DC
  Administered 2017-10-14 – 2017-10-22 (×16): 650 mg
  Filled 2017-10-14 (×16): qty 1

## 2017-10-14 MED ORDER — PRAVASTATIN SODIUM 20 MG PO TABS
20.0000 mg | ORAL_TABLET | ORAL | Status: DC
  Administered 2017-10-15 – 2017-10-22 (×8): 20 mg
  Filled 2017-10-14 (×9): qty 1

## 2017-10-14 MED ORDER — LOPERAMIDE HCL 1 MG/5ML PO LIQD
2.0000 mg | ORAL | Status: DC | PRN
  Administered 2017-10-18 – 2017-10-22 (×6): 2 mg
  Filled 2017-10-14 (×7): qty 10

## 2017-10-14 MED ORDER — FAMOTIDINE 40 MG/5ML PO SUSR
20.0000 mg | Freq: Two times a day (BID) | ORAL | Status: DC
  Administered 2017-10-14 – 2017-10-22 (×16): 20 mg
  Filled 2017-10-14 (×16): qty 2.5

## 2017-10-14 MED ORDER — POTASSIUM CHLORIDE 20 MEQ/15ML (10%) PO SOLN
40.0000 meq | Freq: Two times a day (BID) | ORAL | Status: DC
  Administered 2017-10-14: 40 meq
  Filled 2017-10-14: qty 30

## 2017-10-14 MED ORDER — FLORANEX PO PACK
1.0000 g | PACK | Freq: Every day | ORAL | Status: DC
  Administered 2017-10-15 – 2017-10-22 (×8): 1 g
  Filled 2017-10-14 (×8): qty 1

## 2017-10-14 MED ORDER — ASPIRIN 325 MG PO TABS
325.0000 mg | ORAL_TABLET | Freq: Every day | ORAL | Status: DC
  Administered 2017-10-15 – 2017-10-22 (×8): 325 mg
  Filled 2017-10-14 (×8): qty 1

## 2017-10-14 MED ORDER — VITAMIN B-1 100 MG PO TABS
100.0000 mg | ORAL_TABLET | Freq: Every day | ORAL | Status: DC
  Administered 2017-10-15 – 2017-10-22 (×8): 100 mg
  Filled 2017-10-14 (×8): qty 1

## 2017-10-14 MED ORDER — ALBUTEROL SULFATE (2.5 MG/3ML) 0.083% IN NEBU: 2.5000 mg | INHALATION_SOLUTION | RESPIRATORY_TRACT | Status: DC | PRN

## 2017-10-14 NOTE — Progress Notes (Signed)
[place] TEAM 1 - Stepdown/ICU TEAM  Ashlee Snow  KTG:[id number] DOB: 1956-09-04 DOA: 09/23/2017 PCP: Monico Blitz, MD    Brief Narrative:  61 y.o. female w/ history of HTN, CAD, hypothyroidism, chronic pain, depression, and PAD status post aortobifemoral bypass in June who presented to the ED for evaluation of confusion, right flank pain, and nausea with vomiting.  Patient had reportedly been complaining of right flank pain for a couple of weeks bit was otherwise well until the afternoon of 09/28/2017 when her pain increased fairly acutely and she developed nausea with nonbloody vomiting.  She did not get out of bed the following day due to her illness and became quite confused by that evening.  She had refused to go to the emergency department, but with her condition persisting her husband overrode her decision and she was taken to the ED.  [facility] ED:  Upon arrival to the ED, patient was found to be febrile to 40.3 degree C, tachypneic to 30, tachycardic to 150, and with stable blood pressure.  CT abdomen and pelvis was notable for R hydronephrosis and proximal right hydroureter without definite calculus, raising concern for stricture/scar.  CT head was notable for decreased attenuation throughout the posterior-mid right cerebellum, likely representing acute infarction.  Creatinine was 1.91, up from 0.85 June 2018.  Lactic acid was elevated to 11.9.  Critical care was consulted for admission but recommended a medical admission in the stepdown unit.  Urology was also consulted, as well as Neurology.   A ureteral stent was placed by Urology, and she was tx for urosepsis w/ abx and aggressive IVF.  Her O2 requirements increased signif on 12/10.    Significant Events: 12/8 admit to [facility] 12/9 emergent R ureteral stent placement  12/13 TEE aborted as pt not stable   12/14 intubated  12/19 extubated  Subjective: Resting comfortably in bed.  No respiratory distress or evidence of  uncontrolled pain.    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 Endocarditis Remains on abx as directed by ID service - to have eventual TEE  Acute hypoxic respiratory failure due to aspiration  sats improving on minimal O2 support - wean O2 as able   Encephalopathy  Appears to be slowly improving per chart review - follow   Hematuria  L LE and R UE acute DVT Continue full dose lovenox   AKI Resolved   HLD   Diarrhea   Hypokalemia  Supplement and follow - Mg is normal   Tobacco abuse   DVT prophylaxis: lovenox  Code Status: FULL CODE Family Communication: no family present at time of exam  Disposition Plan: SDU  Consultants:  Neurology  PCCM ID Urology Neurosurgery   Objective: Blood pressure 130/74, pulse 88, temperature 99.6 F (37.6 C), temperature source Oral, resp. rate (!) 21, height '5\' 4"'  (1.626 m), weight 60.1 kg (132 lb 7.9 oz), SpO2 99 %.  Intake/Output Summary (Last 24 hours) at 10/14/2017 1519 Last data filed at 10/14/2017 1500 Gross per 24 hour  Intake 2040 ml  Output 1645 ml  Net 395 ml   Filed Weights   10/12/17 0500 10/13/17 0500 10/14/17 0600  Weight: 63.2 kg (139 lb 5.3 oz) 60.6 kg (133 lb 9.6 oz) 60.1 kg (132 lb 7.9 oz)    Examination: General: No acute respiratory distress Lungs: Clear to auscultation bilaterally without wheezes or crackles Cardiovascular: Regular  rate and rhythm without murmur gallop or rub normal S1 and S2 Abdomen: Nontender, nondistended, soft, bowel sounds positive Extremities: No significant edema bilateral lower extremities  CBC: Recent Labs  Lab 10/08/17 0452  10/10/17 0359 10/11/17 0240 10/12/17 0319 10/13/17 0554 10/14/17 0514  WBC 9.8   < > 9.4 10.1 10.7* 8.9 7.8  NEUTROABS 7.4  --   --   --   --   --   --   HGB 8.8*   < > 8.4* 8.4* 9.6* 8.3* 8.7*  HCT 28.3*   < > 26.5* 26.2* 29.0* 25.4* 26.3*  MCV 93.4   < > 93.3 92.6 92.7 91.4 92.6  PLT 145*   < > 182 281 391 476* 514*   < > = values in this interval not displayed.   Basic Metabolic Panel: Recent Labs  Lab 10/10/17 0359 10/11/17 0240 10/12/17 0319 10/13/17 0554 10/14/17 0514  NA 141 140 139 139 140  K 3.7 3.9 3.5 2.8* 3.3*  CL 118* 116* 113* 112* 113*  CO2 13* 13* 17* 18* 17*  GLUCOSE 196* 167* 141* 190* 182*  BUN 20 22* '20 20 20  ' CREATININE 1.13* 1.06* 0.81 0.82 0.78  CALCIUM 7.9* 8.5* 8.8* 8.3* 8.2*  MG  --  2.0  --   --   --   PHOS  --  2.1*  --   --   --    GFR: Estimated Creatinine Clearance: 65.4 mL/min (by C-G formula based on SCr of 0.78 mg/dL).  Liver Function Tests: Recent Labs  Lab 10/08/17 0452  AST 74*  ALT 22  ALKPHOS 64  BILITOT 0.7  PROT 5.3*  ALBUMIN 2.2*   HbA1C: Hgb A1c MFr Bld  Date/Time Value Ref Range Status  10/01/2017 04:20 AM 6.3 (H) 4.8 - 5.6 % Final    Comment:    (NOTE) Pre diabetes:          5.7%-6.4% Diabetes:              >6.4% Glycemic control for   <7.0% adults with diabetes     CBG: Recent Labs  Lab 10/13/17 2332 10/14/17 0337 10/14/17 0732 10/14/17 1201 10/14/17 1208  GLUCAP 189* 145* 168* 161* 185*    Recent Results (from the past 240 hour(s))  C difficile quick scan w PCR reflex     Status: None   Collection Time: 10/06/17 10:05 AM  Result Value Ref Range Status   C Diff antigen NEGATIVE NEGATIVE Final   C Diff toxin NEGATIVE NEGATIVE Final   C Diff interpretation No C.  difficile detected.  Final  Culture, Urine     Status: None   Collection Time: 10/06/17 11:04 AM  Result Value Ref Range Status   Specimen Description URINE, CATHETERIZED  Final   Special Requests Normal  Final   Culture NO GROWTH  Final   Report Status 10/07/2017 FINAL  Final  Culture, respiratory (NON-Expectorated)     Status: None   Collection Time: 10/06/17  2:31 PM  Result Value Ref Range Status   Specimen Description TRACHEAL ASPIRATE  Final   Special Requests Normal  Final   Gram Stain   Final    MODERATE WBC PRESENT, PREDOMINANTLY MONONUCLEAR NO ORGANISMS SEEN    Culture NO GROWTH 2 DAYS  Final   Report Status 10/09/2017 FINAL  Final  Culture, blood (routine x 2)     Status: None (Preliminary result)   Collection Time: 10/10/17 12:20 PM  Result Value Ref Range Status   Specimen Description BLOOD LEFT HAND  Final   Special Requests IN PEDIATRIC BOTTLE Blood Culture adequate volume  Final   Culture NO GROWTH 4 DAYS  Final   Report Status PENDING  Incomplete  Culture, blood (routine x 2)     Status: None (Preliminary result)   Collection Time: 10/10/17 12:28 PM  Result Value Ref Range Status   Specimen Description BLOOD LEFT HAND  Final   Special Requests IN PEDIATRIC BOTTLE Blood Culture adequate volume  Final   Culture NO GROWTH 4 DAYS  Final   Report Status PENDING  Incomplete     Scheduled Meds: .  stroke: mapping our early stages of recovery book   Does not apply Once  . aspirin  325 mg Oral Daily  . chlorhexidine gluconate (MEDLINE KIT)  15 mL Mouth Rinse BID  . enoxaparin (LOVENOX) injection  60 mg Subcutaneous Q12H  . famotidine  20 mg Oral BID  . insulin aspart  0-15 Units Subcutaneous Q4H  . insulin aspart  2 Units Subcutaneous Q6H  . lactobacillus  1 g Oral Daily  . levothyroxine  50 mcg Intravenous Daily  . pravastatin  20 mg Oral BH-q7a  . sodium bicarbonate  650 mg Oral BID  . thiamine  100 mg Oral Daily     LOS: 14 days   Cherene Altes,  MD [facility] Office  [phone] Pager - Text Page per Shea Evans as per below:  On-Call/Text Page:      [email or web address]      password [email or web address]  If 7PM-7AM, please contact night-coverage [email or web address] Password [email or web address] 10/14/2017, 3:19 PM

## 2017-10-14 NOTE — Progress Notes (Signed)
STROKE TEAM PROGRESS NOTE   SUBJECTIVE (INTERVAL HISTORY) Pt RN at bedside. Pt tolerating extubation well. Awake alert, still has right gaze preference but able to cross midline. RUE plegic but RLE at least 3-/5. Still severe dysarthria but able to repeat and tell me her age. Follows simple commands on the right. No family is at bedside.    OBJECTIVE Lab Results: CBC:  Recent Labs  Lab 10/12/17 0319 10/13/17 0554 10/14/17 0514  WBC 10.7* 8.9 7.8  HGB 9.6* 8.3* 8.7*  HCT 29.0* 25.4* 26.3*  MCV 92.7 91.4 92.6  PLT 391 476* 514*   BMP: Recent Labs  Lab 10/10/17 0359 10/11/17 0240 10/12/17 0319 10/13/17 0554 10/14/17 0514  NA 141 140 139 139 140  K 3.7 3.9 3.5 2.8* 3.3*  CL 118* 116* 113* 112* 113*  CO2 13* 13* 17* 18* 17*  GLUCOSE 196* 167* 141* 190* 182*  BUN 20 22* 20 20 20   CREATININE 1.13* 1.06* 0.81 0.82 0.78  CALCIUM 7.9* 8.5* 8.8* 8.3* 8.2*  MG  --  2.0  --   --   --   PHOS  --  2.1*  --   --   --    Liver Function Tests:  Recent Labs  Lab 10/08/17 0452  AST 74*  ALT 22  ALKPHOS 64  BILITOT 0.7  PROT 5.3*  ALBUMIN 2.2*   No results for input(s): AMMONIA in the last 168 hours. Cardiac Enzymes:  No results for input(s): CKTOTAL, CKMB, CKMBINDEX, TROPONINI in the last 168 hours. Coagulation Studies:  No results for input(s): APTT, INR in the last 72 hours. PHYSICAL EXAM Temp:  [97.8 F (36.6 C)-100.7 F (38.2 C)] 98.2 F (36.8 C) (12/22 0400) Pulse Rate:  [87-101] 101 (12/22 1000) Resp:  [17-36] 22 (12/22 1000) BP: (122-163)/(37-94) 152/64 (12/22 1000) SpO2:  [85 %-100 %] 100 % (12/22 1000) FiO2 (%):  [30 %] 30 % (12/21 1532) Weight:  [132 lb 7.9 oz (60.1 kg)] 132 lb 7.9 oz (60.1 kg) (12/22 0600)   Vitals:   10/14/17 0600 10/14/17 0800 10/14/17 0900 10/14/17 1000  BP: (!) 161/70 (!) 149/69 (!) 143/64 (!) 152/64  Pulse: 98 96 97 (!) 101  Resp: (!) 22 (!) 28 (!) 29 (!) 22  Temp:      TempSrc:      SpO2: 100% 100% 100% 100%  Weight: 132 lb 7.9  oz (60.1 kg)     Height:         General - Well nourished, well developed, not in acute distress  Ophthalmologic - Fundi not visualized due to noncooperation.  Cardiovascular - Regular rate and rhythm.  Neuro - awake alert, eyes spontaneously open, orientated to place and age but not to time. Able to repeat but severe dysarthria. PERRL, not blinking to visual threat on the left, eyes right gaze preference but able to cross midline, left facial droop. Did not follow commands with tongue protrusion. RUE and RLE spontaneous movement against gravity movement, LUE mild extension and LLE 3-/5 withdraw on pain. DTR 1+ and positive babinski bilaterally. Sensation, coordination not cooperative and gait not tested.   IMAGING  I have personally reviewed the radiological images below and agree with the radiology interpretations.  Ct Head Wo Contrast 10/01/2017 IMPRESSION:  No significant change since yesterday. Acute infarction affecting the right cerebellum with swelling. Mass-effect upon the fourth ventricle but no change in size the lateral or third ventricles at this time. No hemorrhage. 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  MRI/MRA Brain Wo Contrast 10/01/2017 IMPRESSION:  1. Acute/subacute inferior right cerebellum and vermis infarct with associated hemorrhage.  2. Additional scattered punctate foci of acute nonhemorrhagic infarct involving the right PCA territory with 1 lesion involving the right MCA territory along the right middle frontal gyrus.  3. Multiple foci of remote encephalomalacia involving the inferior posterior right temporal lobe, lateral right temporal lobe, and high right parietal lobe.  4. 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 The right A1 and likely M1 segments fill via a patent anterior communicating artery.  5. Fenestration of the vertebrobasilar junction without significant basilar artery stenosis.   6. Marked attenuation of PCA branch vessels, worse on the right. Moderate proximal right PCA stenosis. 7. Fetal type left posterior cerebral artery.    Echocardiogram:  - Left ventricle: The cavity size was normal. Wall thickness was   increased in a pattern of mild LVH. Systolic function was normal.   The estimated ejection fraction was in the range of 60% to 65%.   Wall motion was normal; there were no regional wall motion   abnormalities. Doppler parameters are consistent with abnormal   left ventricular relaxation (grade 1 diastolic dysfunction). - Mitral valve: There was mild regurgitation.  B/L Carotid U/S:                                                Right ECA and ICA waveforms are very similar. Unable to interrogate stenosis.  40-59% left ICA stenosis. Bilateral vertebral artery flow is antegrade.   CT Head: 10/02/17                                                    1. No significant change in the appearance of acute to subacute, hemorrhagic right inferior cerebellar infarct with slight positive mass effect on the adjacent fourth ventricle. No dilatation the third or lateral ventricles. 2. Chronic right parietal, posteromedial temporal and occipital lobe infarcts as well as right periventricular white matter tract lacunar infarct.    EEG 10/02/17:   This normal EEG is recorded in the waking state      Ct Head Wo Contrast 10/06/2017 IMPRESSION:  1. New, large area of cytotoxic edema within the posterior right hemisphere, involving the posterior parietal and temporal lobes and the occipital lobe. This includes portions of the posterior right MCA territory and the PCA territory.  2. Unchanged cytotoxic edema of the right cerebellar hemisphere with mass effect on the fourth ventricle.  3. No herniation, hydrocephalus or hemorrhage.    Ct Angio Head and neck W Or Wo Contrast 10/09/2017 IMPRESSION:  1. Occluded right cavernous ICA and occluded or severely stenotic right  M1 segment. Hypertrophic lenticulostriate on the right with prompt filling of right MCA branches, a chronic appearance.   The right cervical ICA is diffusely diminutive, presumably secondary.  2. Right P2 occlusion.  3. Moderate to advanced left paraclinoid ICA segment narrowing.  4. Poor visualization of the right V1 segment due to artifact and/or atheromatous narrowing.  5. Known acute infarcts in the right posterior cerebrum and right cerebellum. No hydrocephalus, acute hemorrhage, or visible progression.   LE venous doppler -  Right leg, negative  for deep and superficial vein thrombosis.  Left leg, positive for acute deep vein thrombosis involving the peroneal veins   UE venous doppler -  Acute deep vein thrombosis in the distal right subclavian, axillary, proximal brachial veins. Superficial vein thrombosis throughout the right cephalic and basilic veins.   Somewhat difficult exam due to patient position and cooperation                ASSESSMENT: Ashlee Snow is a 61 y.o. female with PMH of prior hypertension, coronary artery disease, hypothyroidism, chronic pain, depression, smoker and peripheral arterial disease status post aorto-iliac bypass in June admitted for fever, confusion, right flank pain. Found to have right pyelonephritis and hydronephrosis. Also MRI showed right cerebellar large infarcts with right MCA scattered infarcts. B Cx showed MSSA. On Abx. However, repeat CT head on 10/06/17 showed new large right MCA infarcts. Concerning for endocarditis.    Acute, large Right cerebellar stroke with petechial hemorrhagic conversion as well as acute large right MCA and PCA infarct - concerning for endocarditis, but also found to have High-grade stenosis or occlusion of the cavernous right internal carotid artery.    Resultant - intubated on sedation, left hemiparesis -> now extubated 10/11/2017  CT head 10/02/17 right cerebellar large infarct  MRI head 10/01/17 right  cerebellar large infarct and right MCA scattered small infarcts. Old right PCA, right caudate, right parietal high convexity  MRA head - right ICA cavernous high grade stenosis vs. Occlusion, b/l M1 stenosis, right PCA distal stenosis  CT head 10/06/17 - new large right MCA and PCA infarct with cytotoxic edema  CTA head and neck - right ICA occlusion, right M1 high grade stenosis vs. Occlusion, right P2 occlusion and right V1 high grade stenosis, no mycotic aneurysms  CUS - waveform consistent with right ICA distal stenosis vs. Occlusion  EEG - 10/02/2017 - no abnormalities  TTE EF 60-65%, no vegetation  Consider TEE to rule out endocarditis and also evaluate PFO - will shoot for next Monday  Heparin subq for DVT prophylaxis  NPO for now  No antithrombotics PTA, now on ASA 325mg .  Therapy recommendation - CIR recommended  Deposition - pending  Sepsis with pyelonephritis and bacteremia - ? Endocarditis  Intermittent high grade fever with low BP  Admitted for pyelonephritis with hydronephrosis confirmed on CT  B Cx positive for MSSA 10/03/17, repeat culture neg  Urine culture neg  Sputum culture neg  On nafcillin  Due to recurrent stroke, concerning for endocarditis   Consider TEE - shoot for next Monday  Dr Elease Hashimoto saw pt 09/24/2017 for possible TEE  Hematuria   Continued hematuria  H&H stable - Hb 8.4->9.6  Continue monitoring  UA 10/06/17 - RBC TNTC, WBC 6-30  CCM on board  Right LE and UE acute DVT  Venous doppler confirmed RLE and RUE DVT -> full dose lovenox  Discussed with Dr. Odessa Fleming, was on heparin IV but agree to change to lovenox due to IV access  Consider DOACs once medically stable and no planned procedure  Will need TEE or TCD bubble study later to evaluate PFO  AKI, resolved  Cre 1.64->2.01->1.36->1.73->1.23->1.05->1.13->1.06->0.81->0.78  On IVF and TF  CCM on board   Continue to monitor  Intracranial atherosclerosis and  stenosis:  MRA showed right ICA distal occlusion, b/l M1 stenosis, right PCA distal stenosis  CTA head and neck - right ICA occlusion, right M1 high grade stenosis vs. Occlusion, right P2 occlusion and right V1 high grade stenosis,  CUS consistent with right ICA distal stenosis or occlusion  On ASA now  May consider DAPT on discharge if pt hematuria improves  Will consider cerebral angiogram to further assess right ICA and MCA occlusion/stenosis once more stabilized  Avoid hypotension  HTN / hypotension  Stable now  D/c'ed amlodipine  Resume TF and continue IVF  BP goal 130-150 due to right ICA occlusion  Avoid hypotension  HYPERLIPIDEMIA  Home Meds:  Pravachol 20 mg  LDL 46,  goal < 70  AST 183->74  ALT 72->22  Resumed pravachol 20  TOBACCO ABUSE  Current smoker  Smoking cessation counseling when applicable  Nicotine patch PRN  Other Active Problems:  Hypernatremia - resolved  Hyperglycemia - improved  Hypoxia -  Seen by CCM 10/13/2017 - pt to remain in unit for now (extubated 10/11/2017)  May not be ready for TEE Monday. Dr Elease HashimotoNahser saw pt 2017/10/07 - note deleted [facility]  Hospital day # 14  This patient is critically ill due to recurrent stroke, sepsis, fever, bacteremia, AKI, intubated, DVT and at significant risk of neurological worsening, death form recurrent stroke, hemorrhagic conversion, septic shock, seizure, endocarditis, kidney failure. This patient's care requires constant monitoring of vital signs, hemodynamics, respiratory and cardiac monitoring, review of multiple databases, neurological assessment, discussion with family, other specialists and medical decision making of high complexity.I spent 30 minutes of neurocritical care time in the care of this patient.    To contact Stroke Continuity provider, please refer to [email or web address]. After hours, contact General Neurology

## 2017-10-14 NOTE — Progress Notes (Signed)
SLP Cancellation Note  Patient Details Name: Ashlee Snow MRN: [id number] DOB: 08-22-1956   Cancelled treatment:       Reason Eval/Treat Not Completed: Fatigue/lethargy limiting ability to participate   Blenda MountsCouture, Tesneem Dufrane Laurice 10/14/2017, 11:23 AM

## 2017-10-15 ENCOUNTER — Inpatient Hospital Stay (HOSPITAL_COMMUNITY): Payer: BLUE CROSS/BLUE SHIELD

## 2017-10-15 DIAGNOSIS — I5032 Chronic diastolic (congestive) heart failure: Secondary | ICD-10-CM

## 2017-10-15 DIAGNOSIS — I82401 Acute embolism and thrombosis of unspecified deep veins of right lower extremity: Secondary | ICD-10-CM

## 2017-10-15 DIAGNOSIS — R319 Hematuria, unspecified: Secondary | ICD-10-CM

## 2017-10-15 DIAGNOSIS — N10 Acute tubulo-interstitial nephritis: Secondary | ICD-10-CM

## 2017-10-15 LAB — BLOOD GAS, ARTERIAL
ACID-BASE DEFICIT: 5.2 mmol/L — AB (ref 0.0–2.0)
Bicarbonate: 17.4 mmol/L — ABNORMAL LOW (ref 20.0–28.0)
Drawn by: 283381
FIO2: 100
O2 SAT: 82.4 %
PCO2 ART: 22.7 mmHg — AB (ref 32.0–48.0)
PH ART: 7.499 — AB (ref 7.350–7.450)
PO2 ART: 46.5 mmHg — AB (ref 83.0–108.0)
Patient temperature: 100

## 2017-10-15 LAB — POCT I-STAT 3, ART BLOOD GAS (G3+)
ACID-BASE DEFICIT: 7 mmol/L — AB (ref 0.0–2.0)
Acid-base deficit: 6 mmol/L — ABNORMAL HIGH (ref 0.0–2.0)
BICARBONATE: 16.5 mmol/L — AB (ref 20.0–28.0)
Bicarbonate: 17.5 mmol/L — ABNORMAL LOW (ref 20.0–28.0)
O2 SAT: 91 %
O2 Saturation: 98 %
PCO2 ART: 24.5 mmHg — AB (ref 32.0–48.0)
PCO2 ART: 25.7 mmHg — AB (ref 32.0–48.0)
PH ART: 7.441 (ref 7.350–7.450)
PO2 ART: 89 mmHg (ref 83.0–108.0)
PO2 ART: 58 mmHg — AB (ref 83.0–108.0)
Patient temperature: 97.1
Patient temperature: 98.6
TCO2: 17 mmol/L — ABNORMAL LOW (ref 22–32)
TCO2: 18 mmol/L — ABNORMAL LOW (ref 22–32)
pH, Arterial: 7.434 (ref 7.350–7.450)

## 2017-10-15 LAB — BASIC METABOLIC PANEL
ANION GAP: 8 (ref 5–15)
Anion gap: 8 (ref 5–15)
BUN: 19 mg/dL (ref 6–20)
BUN: 19 mg/dL (ref 6–20)
CALCIUM: 8.1 mg/dL — AB (ref 8.9–10.3)
CALCIUM: 8.3 mg/dL — AB (ref 8.9–10.3)
CHLORIDE: 115 mmol/L — AB (ref 101–111)
CHLORIDE: 118 mmol/L — AB (ref 101–111)
CO2: 18 mmol/L — ABNORMAL LOW (ref 22–32)
CO2: 17 mmol/L — AB (ref 22–32)
CREATININE: 0.78 mg/dL (ref 0.44–1.00)
CREATININE: 0.78 mg/dL (ref 0.44–1.00)
GFR calc Af Amer: >60 mL/min (ref >60)
GFR calc non Af Amer: >60 mL/min (ref >60)
GFR calc non Af Amer: >60 mL/min (ref >60)
GLUCOSE: 207 mg/dL — AB (ref 65–99)
Glucose, Bld: 185 mg/dL — ABNORMAL HIGH (ref 65–99)
Potassium: 3.4 mmol/L — ABNORMAL LOW (ref 3.5–5.1)
Potassium: 3.9 mmol/L (ref 3.5–5.1)
SODIUM: 141 mmol/L (ref 135–145)
Sodium: 143 mmol/L (ref 135–145)

## 2017-10-15 LAB — GLUCOSE, CAPILLARY
GLUCOSE-CAPILLARY: 135 mg/dL — AB (ref 65–99)
GLUCOSE-CAPILLARY: 159 mg/dL — AB (ref 65–99)
GLUCOSE-CAPILLARY: 175 mg/dL — AB (ref 65–99)
GLUCOSE-CAPILLARY: 183 mg/dL — AB (ref 65–99)
GLUCOSE-CAPILLARY: 213 mg/dL — AB (ref 65–99)
Glucose-Capillary: 175 mg/dL — ABNORMAL HIGH (ref 65–99)

## 2017-10-15 LAB — CBC
HCT: 25.7 % — ABNORMAL LOW (ref 36.0–46.0)
Hemoglobin: 8.2 g/dL — ABNORMAL LOW (ref 12.0–15.0)
MCH: 29.9 pg (ref 26.0–34.0)
MCHC: 31.9 g/dL (ref 30.0–36.0)
MCV: 93.8 fL (ref 78.0–100.0)
Platelets: 534 10*3/uL — ABNORMAL HIGH (ref 150–400)
RBC: 2.74 MIL/uL — AB (ref 3.87–5.11)
RDW: 19.3 % — ABNORMAL HIGH (ref 11.5–15.5)
WBC: 7.3 10*3/uL (ref 4.0–10.5)

## 2017-10-15 LAB — CULTURE, BLOOD (ROUTINE X 2)
Culture: NO GROWTH
Culture: NO GROWTH
SPECIAL REQUESTS: ADEQUATE
SPECIAL REQUESTS: ADEQUATE

## 2017-10-15 LAB — MAGNESIUM: Magnesium: 2.5 mg/dL — ABNORMAL HIGH (ref 1.7–2.4)

## 2017-10-15 LAB — D-DIMER, QUANTITATIVE (NOT AT ARMC): D DIMER QUANT: 3.72 ug{FEU}/mL — AB (ref 0.00–0.50)

## 2017-10-15 MED ORDER — FUROSEMIDE 10 MG/ML IJ SOLN
INTRAMUSCULAR | Status: AC
  Administered 2017-10-15: 40 mg via INTRAVENOUS
  Filled 2017-10-15: qty 4

## 2017-10-15 MED ORDER — FUROSEMIDE 10 MG/ML IJ SOLN
40.0000 mg | Freq: Once | INTRAMUSCULAR | Status: AC
  Administered 2017-10-15: 40 mg via INTRAVENOUS

## 2017-10-15 MED ORDER — ETOMIDATE 2 MG/ML IV SOLN
20.0000 mg | Freq: Once | INTRAVENOUS | Status: AC
  Administered 2017-10-15: 20 mg via INTRAVENOUS

## 2017-10-15 MED ORDER — LORAZEPAM 2 MG/ML IJ SOLN
0.2500 mg | Freq: Once | INTRAMUSCULAR | Status: AC
  Administered 2017-10-15: 0.25 mg via INTRAVENOUS
  Filled 2017-10-15: qty 1

## 2017-10-15 MED ORDER — MORPHINE SULFATE (PF) 2 MG/ML IV SOLN
2.0000 mg | Freq: Once | INTRAVENOUS | Status: AC
  Administered 2017-10-15: 2 mg via INTRAVENOUS

## 2017-10-15 MED ORDER — CHLORHEXIDINE GLUCONATE 0.12% ORAL RINSE (MEDLINE KIT)
15.0000 mL | Freq: Two times a day (BID) | OROMUCOSAL | Status: DC
  Administered 2017-10-16 – 2017-10-22 (×13): 15 mL via OROMUCOSAL

## 2017-10-15 MED ORDER — DEXMEDETOMIDINE HCL IN NACL 200 MCG/50ML IV SOLN
0.4000 ug/kg/h | INTRAVENOUS | Status: DC
  Administered 2017-10-15: 0.4 ug/kg/h via INTRAVENOUS
  Administered 2017-10-15 – 2017-10-16 (×2): 0.7 ug/kg/h via INTRAVENOUS
  Administered 2017-10-16 (×2): 0.6 ug/kg/h via INTRAVENOUS
  Administered 2017-10-16: 0.7 ug/kg/h via INTRAVENOUS
  Administered 2017-10-16: 0.6 ug/kg/h via INTRAVENOUS
  Administered 2017-10-17: 0.9 ug/kg/h via INTRAVENOUS
  Administered 2017-10-17: 1.2 ug/kg/h via INTRAVENOUS
  Administered 2017-10-17 (×4): 1 ug/kg/h via INTRAVENOUS
  Administered 2017-10-17: 0.9 ug/kg/h via INTRAVENOUS
  Administered 2017-10-18: 0.7 ug/kg/h via INTRAVENOUS
  Administered 2017-10-18: 1 ug/kg/h via INTRAVENOUS
  Administered 2017-10-18 (×2): 1.1 ug/kg/h via INTRAVENOUS
  Administered 2017-10-18: 0.9 ug/kg/h via INTRAVENOUS
  Administered 2017-10-18: 1 ug/kg/h via INTRAVENOUS
  Administered 2017-10-19: 0.6 ug/kg/h via INTRAVENOUS
  Administered 2017-10-19: 0.7 ug/kg/h via INTRAVENOUS
  Filled 2017-10-15 (×24): qty 50

## 2017-10-15 MED ORDER — ORAL CARE MOUTH RINSE
15.0000 mL | OROMUCOSAL | Status: DC
  Administered 2017-10-15 – 2017-10-22 (×65): 15 mL via OROMUCOSAL

## 2017-10-15 MED ORDER — MORPHINE SULFATE (PF) 2 MG/ML IV SOLN
INTRAVENOUS | Status: AC
  Administered 2017-10-15: 2 mg via INTRAVENOUS
  Filled 2017-10-15: qty 1

## 2017-10-15 MED ORDER — POTASSIUM CHLORIDE 20 MEQ/15ML (10%) PO SOLN
50.0000 meq | Freq: Once | ORAL | Status: AC
  Administered 2017-10-15: 50 meq
  Filled 2017-10-15: qty 45

## 2017-10-15 MED ORDER — FENTANYL CITRATE (PF) 100 MCG/2ML IJ SOLN
50.0000 ug | Freq: Once | INTRAMUSCULAR | Status: AC
  Administered 2017-10-15: 50 ug via INTRAVENOUS
  Filled 2017-10-15: qty 2

## 2017-10-15 NOTE — Progress Notes (Signed)
  Speech Language Pathology Treatment: Dysphagia  Patient Details Name: Ashlee Snow MRN: [id number] DOB: 26-Feb-1956 Today's Date: 10/15/2017 Time: [id number] SLP Time Calculation (min) (ACUTE ONLY): 10 min  Assessment / Plan / Recommendation Clinical Impression  Pt is alert today but needs Max cues to attend to SLP/PO trials. She has audible congestion at baseline and with Mod cueing will complete a volitional cough, although it is very weak. SLP provided ice chips to facilitate clearance of secretions with oral holding and delayed coughing noted. No swallow response was elicited. With Max additional cues for a more effortful cough she is ultimately able to expectorate a small amount of thick secretions. Recommend that she remain NPO with emphasis on thorough oral care.    HPI HPI: This is a 61 year old with a past medical history of fibromyalgia, hypertension, and hypothyroidism who for several days prior to admission had apparently been suffering from  Confusion, fevers, N/V, right flank pain,. Found to have Severe sepsis secondary to pyelonephritis with ureteral obstruction and MRI: Acute/subacute inferior right cerebellum and vermis infarct with associated hemorrhage. Additional scattered punctate foci of acute nonhemorrhagic infarct involving the right PCA territory with 1 lesion involving the right MCA territory along the right middle frontal gyrus. Multiple foci of remote encephalomalacia involving the inferior posterior right temporal lobe, lateral right temporal lobe, and high right parietal lobe      SLP Plan  Continue with current plan of care       Recommendations  Diet recommendations: NPO Medication Administration: Via alternative means                Oral Care Recommendations: Oral care QID Follow up Recommendations: Inpatient Rehab SLP Visit Diagnosis: Dysphagia, oropharyngeal phase (R13.12) Plan: Continue with current plan of care       GO                 Maxcine Hamaiewonsky, Legacie Dillingham 10/15/2017, 9:08 AM  Maxcine HamLaura Paiewonsky, M.A. CCC-SLP [phone]

## 2017-10-15 NOTE — Progress Notes (Signed)
Pt became more restless and agitated, temperature 100, resp. in the 40s, oxygen sat in the 80s on RA, Rapid paged by the RT, Dr. Joseph ArtWoods notified, he immediately came up to see the pt, new orders received and implemented. [facility],  report given to RN receiving the pt.

## 2017-10-15 NOTE — Procedures (Signed)
Indication: Hypoxemic respiratory failure  Procedure: Sedation with fentanyl 50 mcg and Versed 2 mg IV. Following sedation, the patient was intubated with a 7.5 cm ETT using the GlideScope 4. ZPosition was initially verified with the CO2 monitor and audible breath sounds. The patient tolerated the procedure well. A post-procedure CXR verified adequate positioning of the ETT with the tip ~ 3 cm from the carina.

## 2017-10-15 NOTE — Progress Notes (Signed)
Mr. Kennedy BuckerGrant (spouse) notified about pt current medical status and  [place] to [address]

## 2017-10-15 NOTE — Progress Notes (Signed)
PROGRESS NOTE    Ashlee Snow  ZOX:[id number] DOB: 1956-09-12 DOA: 10/20/2017 PCP: Monico Blitz, MD   Brief Narrative:  61 y.o. WF PMHx HTN, CAD, hypothyroidism, chronic pain, depression, and PAD status post aortobifemoral bypass in June   Presented to the ED for evaluation of confusion, right flank pain, and nausea with vomiting.  Patient had reportedly been complaining of right flank pain for a couple of weeks bit was otherwise well until the afternoon of 09/28/2017 when her pain increased fairly acutely and she developed nausea with nonbloody vomiting.  She did not get out of bed the following day due to her illness and became quite confused by that evening.  She had refused to go to the emergency department, but with her condition persisting her husband overrode her decision and she was taken to the ED.   [facility] ED:  Upon arrival to the ED, patient was found to be febrile to 40.3 degree C, tachypneic to 30, tachycardic to 150, and with stable blood pressure.  CT abdomen and pelvis was notable for R hydronephrosis and proximal right hydroureter without definite calculus, raising concern for stricture/scar.  CT head was notable for decreased attenuation throughout the posterior-mid right cerebellum, likely representing acute infarction.  Creatinine was 1.91, up from 0.85 June 2018.  Lactic acid was elevated to 11.9.  Critical care was consulted for admission but recommended a medical admission in the stepdown unit.  Urology was also consulted, as well as Neurology.    A ureteral stent was placed by Urology, and she was tx for urosepsis w/ abx and aggressive IVF.  Her O2 requirements increased signif on 12/10.      Subjective: 12/23 alert in obvious respiratory distress will nod yes and no to questions, follows some commands.   Assessment & Plan:   Principal Problem:   MSSA bacteremia Active Problems:   Sepsis ([place])   Stroke (cerebrum) (HCC)   AKI (acute kidney injury) ([place])  Obstructive pyelonephritis   Thrombocytopenia (HCC)   Hypokalemia   Lactic acidosis   Encephalopathy acute   Aspiration into airway   Fever   Dyspnea   Respirator dependent (HCC)   Staphylococcus aureus bacteremia with sepsis (HCC)   Deep venous thrombosis (HCC)  Acute large right cervical or stroke with petechial hemorrhage conversion/acute large right MCA and PCA infarct -Patient with multiple infarcts since admission -TEE on hold to R/O endocarditis until or stable R/O cardiac source for CVA -ASA 325 daily -Full dose Lovenox -Stroke team directing care -Likelihood of patient surviving hospitalization poor. 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 Patient has been intubated multiple times on this admission. If required reintubation would request tracheostomy prior to extubation.  -Titrate O2 to maintain his SPO2> 93%   Encephalopathy -Multifactorial multiple strokes, acute respiratory distress. Treat underlying issues  Hematuria -Unfortunately unable to stop anticoagulate secondary to multiple strokes, DVT. -Monitor hemoglobin level.  Recent Labs  Lab 10/10/17 [phone]  10/11/17 0240 10/12/17 0319 10/13/17 0554 10/14/17 0514 10/15/17 0155  HGB 8.4* 8.4* 9.6* 8.3* 8.7* 8.2*  -Stable continue monitor   Acute LLE and  RUE  DVT -See Doppler results below -Continue full dose Lovenox     Acute kidney injury Recent Labs  Lab 10/11/17 0240 10/12/17 0319 10/13/17 0554 10/14/17 0514 10/15/17 0155 10/15/17 1414  CREATININE 1.06* 0.81 0.82 0.78 0.78 0.78  -Resolved     HLD    Hypokalemia -Potassium goal> 4 -Potassium IV 50 mEq  Hypomagnesemia -Magnesium goal> 2   Tobacco abuse     DVT prophylaxis: Full dose Lovenox Code Status: Full Family Communication: None Disposition Plan: TBD   Consultants:  Neurology  PCCM ID Urology Neurosurgery    Procedures/Significant Events:  12/8 admit to [facility] 12/9 emergent RIGHT ureteral stent placement  12/9 Echocardiogram:Left ventricle: The cavity size was normal. Wall thickness was   increased in a pattern of mild LVH. Systolic function was normal.   The estimated ejection fraction was in the range of 60% to 65%.   Wall motion was normal; there were no regional wall motion   abnormalities. Doppler parameters are consistent with abnormal   left ventricular relaxation (grade 1 diastolic dysfunction). - Mitral valve: There was mild regurgitation. 12/13 TEE aborted as pt not stable   12/14 intubated  12/19 extubated 12/19 Doppler RIGHT upper extremity:Right positive Acute deep vein thrombosis in the distal right subclavian, axillary, brachial. SVT throughout the right cephalic and basilic veins.  12/19 Bilateral lower extremity Doppler: Left: Positive Acute DVT in the Peroneal vein.      I have personally reviewed and interpreted all radiology studies and my findings are as above.  VENTILATOR SETTINGS:    Cultures   Antimicrobials: Anti-infectives (From admission, onward)   Start     Stop   10/11/17 0400  nafcillin 2 g in dextrose 5 % 100 mL IVPB         10/07/17 1400  vancomycin (VANCOCIN) IVPB 1000 mg/200 mL premix  Status:  Discontinued     10/09/17 1043   10/06/17 1200  vancomycin (VANCOCIN) 1,250 mg in sodium chloride 0.9 % 250  mL IVPB     10/06/17 1434   10/04/17 1200  piperacillin-tazobactam (ZOSYN) IVPB 3.375 g     10/11/17 0000   10/03/17 2000  levofloxacin (LEVAQUIN) IVPB 500 mg  Status:  Discontinued     10/04/17 1128   10/02/17 2000  Levofloxacin (LEVAQUIN) IVPB 250 mg  Status:  Discontinued     10/03/17 1039   10/02/17 1600  nafcillin 2 g in dextrose 5 % 100 mL IVPB  Status:  Discontinued     10/04/17 0955   10/01/17 2000  vancomycin (VANCOCIN) IVPB 750 mg/150 ml premix  Status:  Discontinued     10/02/17 1214   10/01/17 0900  meropenem (MERREM) 1 g in sodium chloride 0.9 % 100 mL IVPB  Status:  Discontinued     10/02/17 1214   10/22/2017 2030  vancomycin (VANCOCIN) 1,250 mg in sodium chloride 0.9 % 250 mL IVPB     09/29/2017 2214   10/16/2017 2030  meropenem (MERREM) 1 g in sodium chloride 0.9 % 100 mL IVPB     10/20/2017 2216       Devices    LINES / TUBES:      Continuous Infusions: . feeding supplement (VITAL AF 1.2 CAL) 1,000 mL (10/14/17 2052)  . nafcillin IV Stopped (10/15/17 0431)     Objective: Vitals:  10/15/17 0012 10/15/17 0335 10/15/17 0612 10/15/17 0744  BP: 129/67 136/72  (!) 142/62  Pulse: 99 98 100 96  Resp: (!) 23 (!) 26 (!) 27 (!) 24  Temp: 99.9 F (37.7 C) 99.1 F (37.3 C)  98.8 F (37.1 C)  TempSrc: Oral Axillary  Oral  SpO2: 98% 99% 99% 99%  Weight:  136 lb 7.4 oz (61.9 kg)    Height:        Intake/Output Summary (Last 24 hours) at 10/15/2017 8563 Last data filed at 10/15/2017 0744 Gross per 24 hour  Intake 4580 ml  Output 1385 ml  Net 3195 ml   Filed Weights   10/13/17 0500 10/14/17 0600 10/15/17 0335  Weight: 133 lb 9.6 oz (60.6 kg) 132 lb 7.9 oz (60.1 kg) 136 lb 7.4 oz (61.9 kg)    Examination:  General: Alert, nods yes and no to questions follow some commands, positive acute respiratory distress Eyes: negative scleral hemorrhage, negative anisocoria, positive sluggish reactive right pupil  Lungs: tachypnea, diffuse rhonchi, negative wheezes or  crackles Cardiovascular: Tachycardic, Regular rhythm without murmur gallop or rub normal S1 and S2 Abdomen: negative abdominal pain, nondistended, positive soft, bowel sounds, no rebound, no ascites, no appreciable mass Extremities: No significant cyanosis, clubbing, or edema bilateral lower extremities Skin: Negative rashes, lesions, ulcers Psychiatric:  Unable to evaluate secondary to respiratory distress  Central nervous system:  Patient spontaneously moving all 4 extremities appears to be weaker on the LUE/LLE unable to fully evaluate secondary to respiratory distress. Follow some commands  .     Data Reviewed: Care during the described time interval was provided by me .  I have reviewed this patient's available data, including medical history, events of note, physical examination, and all test results as part of my evaluation.   CBC: Recent Labs  Lab 10/11/17 0240 10/12/17 0319 10/13/17 0554 10/14/17 0514 10/15/17 0155  WBC 10.1 10.7* 8.9 7.8 7.3  HGB 8.4* 9.6* 8.3* 8.7* 8.2*  HCT 26.2* 29.0* 25.4* 26.3* 25.7*  MCV 92.6 92.7 91.4 92.6 93.8  PLT 281 391 476* 514* 149*   Basic Metabolic Panel: Recent Labs  Lab 10/11/17 0240 10/12/17 0319 10/13/17 0554 10/14/17 0514 10/15/17 0155  NA 140 139 139 140 141  K 3.9 3.5 2.8* 3.3* 3.4*  CL 116* 113* 112* 113* 115*  CO2 13* 17* 18* 17* 18*  GLUCOSE 167* 141* 190* 182* 185*  BUN 22* _0 CREATININE 1.06* 0.81 0.82 0.78 0.78  CALCIUM 8.5* 8.8* 8.3* 8.2* 8.1*  MG 2.0  --   --   --   --   PHOS 2.1*  --   --   --   --    GFR: Estimated Creatinine Clearance: 65.4 mL/min (by C-G formula based on SCr of 0.78 mg/dL). Liver Function Tests: No results for input(s): AST, ALT, ALKPHOS, BILITOT, PROT, ALBUMIN in the last 168 hours. No results for input(s): LIPASE, AMYLASE in the last 168 hours. No results for input(s): AMMONIA in the last 168 hours. Coagulation Profile: No results for input(s): INR, PROTIME in the last 168  hours. Cardiac Enzymes: No results for input(s): CKTOTAL, CKMB, CKMBINDEX, TROPONINI in the last 168 hours. BNP (last 3 results) No results for input(s): PROBNP in the last 8760 hours. HbA1C: No results for input(s): HGBA1C in the last 72 hours. CBG: Recent Labs  Lab 10/14/17 1527 10/14/17 1955 10/15/17 0011 10/15/17 0337 10/15/17 0615  GLUCAP 148* 148* 183* 159* 175*   Lipid Profile:  No results for input(s): CHOL, HDL, LDLCALC, TRIG, CHOLHDL, LDLDIRECT in the last 72 hours. Thyroid Function Tests: No results for input(s): TSH, T4TOTAL, FREET4, T3FREE, THYROIDAB in the last 72 hours. Anemia Panel: No results for input(s): VITAMINB12, FOLATE, FERRITIN, TIBC, IRON, RETICCTPCT in the last 72 hours. Urine analysis:    Component Value Date/Time   COLORURINE RED (A) 10/06/2017 1010   APPEARANCEUR CLOUDY (A) 10/06/2017 1010   LABSPEC 1.020 10/06/2017 1010   PHURINE 5.0 10/06/2017 1010   GLUCOSEU (A) 10/06/2017 1010    TEST NOT REPORTED DUE TO COLOR INTERFERENCE OF URINE PIGMENT   HGBUR (A) 10/06/2017 1010    TEST NOT REPORTED DUE TO COLOR INTERFERENCE OF URINE PIGMENT   BILIRUBINUR (A) 10/06/2017 1010    TEST NOT REPORTED DUE TO COLOR INTERFERENCE OF URINE PIGMENT   KETONESUR (A) 10/06/2017 1010    TEST NOT REPORTED DUE TO COLOR INTERFERENCE OF URINE PIGMENT   PROTEINUR (A) 10/06/2017 1010    TEST NOT REPORTED DUE TO COLOR INTERFERENCE OF URINE PIGMENT   UROBILINOGEN 0.2 01/01/2010 0831   NITRITE (A) 10/06/2017 1010    TEST NOT REPORTED DUE TO COLOR INTERFERENCE OF URINE PIGMENT   LEUKOCYTESUR (A) 10/06/2017 1010    TEST NOT REPORTED DUE TO COLOR INTERFERENCE OF URINE PIGMENT   Sepsis Labs: _0 (procalcitonin:4,lacticidven:4)  ) Recent Results (from the past 240 hour(s))  C difficile quick scan w PCR reflex     Status: None   Collection Time: 10/06/17 10:05 AM  Result Value Ref Range Status   C Diff antigen NEGATIVE NEGATIVE Final   C Diff toxin NEGATIVE NEGATIVE  Final   C Diff interpretation No C. difficile detected.  Final  Culture, Urine     Status: None   Collection Time: 10/06/17 11:04 AM  Result Value Ref Range Status   Specimen Description URINE, CATHETERIZED  Final   Special Requests Normal  Final   Culture NO GROWTH  Final   Report Status 10/07/2017 FINAL  Final  Culture, respiratory (NON-Expectorated)     Status: None   Collection Time: 10/06/17  2:31 PM  Result Value Ref Range Status   Specimen Description TRACHEAL ASPIRATE  Final   Special Requests Normal  Final   Gram Stain   Final    MODERATE WBC PRESENT, PREDOMINANTLY MONONUCLEAR NO ORGANISMS SEEN    Culture NO GROWTH 2 DAYS  Final   Report Status 10/09/2017 FINAL  Final  Culture, blood (routine x 2)     Status: None (Preliminary result)   Collection Time: 10/10/17 12:20 PM  Result Value Ref Range Status   Specimen Description BLOOD LEFT HAND  Final   Special Requests IN PEDIATRIC BOTTLE Blood Culture adequate volume  Final   Culture NO GROWTH 4 DAYS  Final   Report Status PENDING  Incomplete  Culture, blood (routine x 2)     Status: None (Preliminary result)   Collection Time: 10/10/17 12:28 PM  Result Value Ref Range Status   Specimen Description BLOOD LEFT HAND  Final   Special Requests IN PEDIATRIC BOTTLE Blood Culture adequate volume  Final   Culture NO GROWTH 4 DAYS  Final   Report Status PENDING  Incomplete         Radiology Studies: Dg Chest Port 1 View  Result Date: 10/13/2017 CLINICAL DATA:  Hypoxia. EXAM: PORTABLE CHEST 1 VIEW COMPARISON:  10/12/2017. FINDINGS: Normal sized heart. Linear density at the left lung base without significant change. Clear right lung. The lungs remain hyperexpanded  with mild diffuse peribronchial thickening. Feeding tube extending into the stomach. Unremarkable bones. IMPRESSION: 1. Stable left lower lobe atelectasis. 2. Stable changes of COPD and chronic bronchitis. Electronically Signed   By: Claudie Revering M.D.   On: 10/13/2017  15:52        Scheduled Meds: . aspirin  325 mg Per Tube Daily  . chlorhexidine gluconate (MEDLINE KIT)  15 mL Mouth Rinse BID  . enoxaparin (LOVENOX) injection  60 mg Subcutaneous Q12H  . famotidine  20 mg Per Tube BID  . free water  100 mL Per Tube Q8H  . insulin aspart  0-15 Units Subcutaneous Q4H  . insulin aspart  2 Units Subcutaneous Q6H  . lactobacillus  1 g Per Tube Daily  . levothyroxine  50 mcg Intravenous Daily  . LORazepam  0.25 mg Intravenous Once  . potassium chloride  40 mEq Per Tube BID  . pravastatin  20 mg Per Tube [id number]  . sodium bicarbonate  650 mg Per Tube BID  . thiamine  100 mg Per Tube Daily   Continuous Infusions: . feeding supplement (VITAL AF 1.2 CAL) 1,000 mL (10/14/17 2052)  . nafcillin IV Stopped (10/15/17 0431)     LOS: 15 days    Time spent: 40 minutes    , Geraldo Docker, MD [facility] Pager [phone]   If 7PM-7AM, please contact night-coverage [email or web address] Password [email or web address] 10/15/2017, 9:04 AM

## 2017-10-15 NOTE — Progress Notes (Signed)
STROKE TEAM PROGRESS NOTE   SUBJECTIVE (INTERVAL HISTORY) No one is at bedside. Pt tolerating extubation well. Awake alert, still has right gaze preference but able to cross midline. RUE plegic but RLE at least 3-/5. Still severe dysarthria   Follows simple commands on the right.     OBJECTIVE Lab Results: CBC:  Recent Labs  Lab 10/13/17 0554 10/14/17 0514 10/15/17 0155  WBC 8.9 7.8 7.3  HGB 8.3* 8.7* 8.2*  HCT 25.4* 26.3* 25.7*  MCV 91.4 92.6 93.8  PLT 476* 514* 534*   BMP: Recent Labs  Lab 10/11/17 0240 10/12/17 0319 10/13/17 0554 10/14/17 0514 10/15/17 0155  NA 140 139 139 140 141  K 3.9 3.5 2.8* 3.3* 3.4*  CL 116* 113* 112* 113* 115*  CO2 13* 17* 18* 17* 18*  GLUCOSE 167* 141* 190* 182* 185*  BUN 22* 20 20 20 19   CREATININE 1.06* 0.81 0.82 0.78 0.78  CALCIUM 8.5* 8.8* 8.3* 8.2* 8.1*  MG 2.0  --   --   --   --   PHOS 2.1*  --   --   --   --    Liver Function Tests:  No results for input(s): AST, ALT, ALKPHOS, BILITOT, PROT, ALBUMIN in the last 168 hours. No results for input(s): AMMONIA in the last 168 hours. Cardiac Enzymes:  No results for input(s): CKTOTAL, CKMB, CKMBINDEX, TROPONINI in the last 168 hours. Coagulation Studies:  No results for input(s): APTT, INR in the last 72 hours. PHYSICAL EXAM Temp:  [98.8 F (37.1 C)-102.5 F (39.2 C)] 102.5 F (39.2 C) (12/23 1213) Pulse Rate:  [88-103] 103 (12/23 1213) Resp:  [19-28] 25 (12/23 1213) BP: (120-151)/(62-86) 120/86 (12/23 1213) SpO2:  [96 %-100 %] 98 % (12/23 1213) Weight:  [136 lb 7.4 oz (61.9 kg)] 136 lb 7.4 oz (61.9 kg) (12/23 0335)   Vitals:   10/15/17 0335 10/15/17 0612 10/15/17 0744 10/15/17 1213  BP: 136/72  (!) 142/62 120/86  Pulse: 98 100 96 (!) 103  Resp: (!) 26 (!) 27 (!) 24 (!) 25  Temp: 99.1 F (37.3 C)  98.8 F (37.1 C) (!) 102.5 F (39.2 C)  TempSrc: Axillary  Oral Axillary  SpO2: 99% 99% 99% 98%  Weight: 136 lb 7.4 oz (61.9 kg)     Height:         General - middle-age  Caucasian lady, not in acute distress  Ophthalmologic - Fundi not visualized due to noncooperation.  Cardiovascular - Regular rate and rhythm.  Neuro - awake alert, eyes spontaneously open, follows only simple commands. Able to repeat but severe dysarthria. PERRL, not blinking to visual threat on the left, eyes right gaze preference but able to cross midline, left facial droop. Did   follow commands with tongue protrusion. RUE and RLE spontaneous movement against gravity movement, LUE mild extension and LLE 3-/5 withdraw on pain. DTR 1+ and positive babinski bilaterally. Sensation, coordination not cooperative and gait not tested.   IMAGING  I have personally reviewed the radiological images below and agree with the radiology interpretations.  Ct Head Wo Contrast 10/01/2017 IMPRESSION:  No significant change since yesterday. Acute infarction affecting the right cerebellum with swelling. Mass-effect upon the fourth ventricle but no change in size the lateral or third ventricles at this time. No hemorrhage. 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     MRI/MRA Brain Wo Contrast 10/01/2017 IMPRESSION:  1. Acute/subacute inferior right cerebellum and vermis infarct with associated hemorrhage.  2. Additional scattered punctate foci  of acute nonhemorrhagic infarct involving the right PCA territory with 1 lesion involving the right MCA territory along the right middle frontal gyrus.  3. Multiple foci of remote encephalomalacia involving the inferior posterior right temporal lobe, lateral right temporal lobe, and high right parietal lobe.  4. 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 The right A1 and likely M1 segments fill via a patent anterior communicating artery.  5. Fenestration of the vertebrobasilar junction without significant basilar artery stenosis.  6. Marked attenuation of PCA branch vessels, worse on the right. Moderate  proximal right PCA stenosis. 7. Fetal type left posterior cerebral artery.    Echocardiogram:  - Left ventricle: The cavity size was normal. Wall thickness was   increased in a pattern of mild LVH. Systolic function was normal.   The estimated ejection fraction was in the range of 60% to 65%.   Wall motion was normal; there were no regional wall motion   abnormalities. Doppler parameters are consistent with abnormal   left ventricular relaxation (grade 1 diastolic dysfunction). - Mitral valve: There was mild regurgitation.  B/L Carotid U/S:                                                Right ECA and ICA waveforms are very similar. Unable to interrogate stenosis.  40-59% left ICA stenosis. Bilateral vertebral artery flow is antegrade.   CT Head: 10/02/17                                                    1. No significant change in the appearance of acute to subacute, hemorrhagic right inferior cerebellar infarct with slight positive mass effect on the adjacent fourth ventricle. No dilatation the third or lateral ventricles. 2. Chronic right parietal, posteromedial temporal and occipital lobe infarcts as well as right periventricular white matter tract lacunar infarct.    EEG 10/02/17:   This normal EEG is recorded in the waking state      Ct Head Wo Contrast 10/06/2017 IMPRESSION:  1. New, large area of cytotoxic edema within the posterior right hemisphere, involving the posterior parietal and temporal lobes and the occipital lobe. This includes portions of the posterior right MCA territory and the PCA territory.  2. Unchanged cytotoxic edema of the right cerebellar hemisphere with mass effect on the fourth ventricle.  3. No herniation, hydrocephalus or hemorrhage.    Ct Angio Head and neck W Or Wo Contrast 10/09/2017 IMPRESSION:  1. Occluded right cavernous ICA and occluded or severely stenotic right M1 segment. Hypertrophic lenticulostriate on the right with prompt filling  of right MCA branches, a chronic appearance.   The right cervical ICA is diffusely diminutive, presumably secondary.  2. Right P2 occlusion.  3. Moderate to advanced left paraclinoid ICA segment narrowing.  4. Poor visualization of the right V1 segment due to artifact and/or atheromatous narrowing.  5. Known acute infarcts in the right posterior cerebrum and right cerebellum. No hydrocephalus, acute hemorrhage, or visible progression.   LE venous doppler -  Right leg, negative for deep and superficial vein thrombosis.  Left leg, positive for acute deep vein thrombosis involving the peroneal veins   UE venous doppler -  Acute deep vein thrombosis in the distal right subclavian, axillary, proximal brachial veins. Superficial vein thrombosis throughout the right cephalic and basilic veins.   Somewhat difficult exam due to patient position and cooperation                ASSESSMENT: Ashlee Snow is a 61 y.o. female with PMH of prior hypertension, coronary artery disease, hypothyroidism, chronic pain, depression, smoker and peripheral arterial disease status post aorto-iliac bypass in June admitted for fever, confusion, right flank pain. Found to have right pyelonephritis and hydronephrosis. Also MRI showed right cerebellar large infarcts with right MCA scattered infarcts. B Cx showed MSSA. On Abx. However, repeat CT head on 10/06/17 showed new large right MCA infarcts. Concerning for endocarditis.    Acute, large Right cerebellar stroke with petechial hemorrhagic conversion as well as acute large right MCA and PCA infarct - concerning for endocarditis, but also found to have High-grade stenosis or occlusion of the cavernous right internal carotid artery.    Resultant - intubated on sedation, left hemiparesis -> now extubated 10/11/2017  CT head 10/02/17 right cerebellar large infarct  MRI head 10/01/17 right cerebellar large infarct and right MCA scattered small infarcts. Old right PCA,  right caudate, right parietal high convexity  MRA head - right ICA cavernous high grade stenosis vs. Occlusion, b/l M1 stenosis, right PCA distal stenosis  CT head 10/06/17 - new large right MCA and PCA infarct with cytotoxic edema  CTA head and neck - right ICA occlusion, right M1 high grade stenosis vs. Occlusion, right P2 occlusion and right V1 high grade stenosis, no mycotic aneurysms  CUS - waveform consistent with right ICA distal stenosis vs. Occlusion  EEG - 10/02/2017 - no abnormalities  TTE EF 60-65%, no vegetation  Consider TEE to rule out endocarditis and also evaluate PFO -cardiology initially turned down request for TEE will shoot for next Monday  Heparin subq for DVT prophylaxis  NPO for now  No antithrombotics PTA, now on ASA 325mg .  Therapy recommendation - CIR recommended  Deposition - pending  Sepsis with pyelonephritis and bacteremia - ? Endocarditis  Intermittent high grade fever with low BP  Admitted for pyelonephritis with hydronephrosis confirmed on CT  B Cx positive for MSSA 10/03/17, repeat culture neg  Urine culture neg  Sputum culture neg  On nafcillin  Due to recurrent stroke, concerning for endocarditis   Consider TEE - shoot for next Monday  Dr Elease Hashimoto saw pt 10/12/2017 for possible TEE  Hematuria   Continued hematuria  H&H stable - Hb 8.4->9.6  Continue monitoring  UA 10/06/17 - RBC TNTC, WBC 6-30  CCM on board  Right LE and UE acute DVT  Venous doppler confirmed RLE and RUE DVT -> full dose lovenox  Discussed with Dr. Odessa Fleming, was on heparin IV but agree to change to lovenox due to IV access  Consider DOACs once medically stable and no planned procedure  Will need TEE or TCD bubble study later to evaluate PFO  AKI, resolved  Cre 1.64->2.01->1.36->1.73->1.23->1.05->1.13->1.06->0.81->0.78  On IVF and TF  CCM on board   Continue to monitor  Intracranial atherosclerosis and stenosis:  MRA showed right ICA  distal occlusion, b/l M1 stenosis, right PCA distal stenosis  CTA head and neck - right ICA occlusion, right M1 high grade stenosis vs. Occlusion, right P2 occlusion and right V1 high grade stenosis,  CUS consistent with right ICA distal stenosis or occlusion  On ASA now  May consider DAPT on discharge  if pt hematuria improves  Will consider cerebral angiogram to further assess right ICA and MCA occlusion/stenosis once more stabilized  Avoid hypotension  HTN / hypotension  Stable now  D/c'ed amlodipine  Resume TF and continue IVF  BP goal 130-150 due to right ICA occlusion  Avoid hypotension  HYPERLIPIDEMIA  Home Meds:  Pravachol 20 mg  LDL 46,  goal < 70  AST 183->74  ALT 72->22  Resumed pravachol 20  TOBACCO ABUSE  Current smoker  Smoking cessation counseling when applicable  Nicotine patch PRN  Other Active Problems:  Hypernatremia - resolved  Hyperglycemia - improved  Hypoxia -  Seen by CCM 10/13/2017 - pt to remain in unit for now (extubated 10/11/2017)  May not be ready for TEE Monday. Dr Elease HashimotoNahser saw pt 10/07/2017 - [facility]  Hospital day # 15  I have personally examined this patient, reviewed notes, independently viewed imaging studies, participated in medical decision making and plan of care.ROS completed by me personally and pertinent positives fully documented  I have made any additions or clarifications directly to the above note.  Greater than 50% time during this 25 minute visit was and on counseling and coordination of care about her embolic strokes and endocarditis  Delia HeadyPramod Krimson Massmann, MD Medical Director [facility] Pager: [phone] 10/15/2017 1:13 PM     To contact Stroke Continuity provider, please refer to [email or web address]. After hours, contact General Neurology

## 2017-10-15 NOTE — Progress Notes (Addendum)
PULMONARY / CRITICAL CARE MEDICINE   Name: Ashlee Snow MRN: [id number] DOB: Aug 28, 1956    ADMISSION DATE:  09/28/2017 CONSULTATION DATE:  10/11/2017  CHIEF COMPLAINT:  Acute hypoxemic respiratory failure  HISTORY OF PRESENT ILLNESS:   This [name] [facility] her admission for fevers, confusion, N&V, and right flank pain.She has a past medical history of fibromyalgia, hypertension, and hypothyroidism who for several days prior to admission had apparently been suffering from nausea and vomiting.  She subsequently had fevers and was evaluated at an outside hospital where she was found to have left hydronephrosis.  A ureteral stent was placed and she was treated for urosepsis with antibiotics and generous fluids.  She grew MSSA from her blood. She was seen by PCCM as her oxygen requirements increased substantially on 12/10 and Levaquin  was added to nafcillin to cover for presumed aspiration. PCXR showed increasing infiltrate in the LLL  Zosyn was subsequently substituted for both.   Here hospital course was complicated by acute/subacute inferior right cerebellum and vermis infarct with associated hemorrhage. 2. Additional scattered punctate foci of acute nonhemorrhagic infarct involving the right PCA territory with 1 lesion involving the right MCA territory along the right middle frontal gyrus. 3. Multiple foci of remote encephalomalacia involving the inferior posterior right temporal lobe, lateral right temporal lobe, and high right parietal lobe. 4. 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 intubation, from which she was extubated on 12/19.  This afternoon the patient became acutely hypoxemic with respiratory distress necessitating transfer to the ICU. ABGs on NRM were PO2 46.5, PCO2 22.7 and pH 7.499.       PAST MEDICAL HISTORY :  She  has a past medical history of Asthma, Bronchitis,  DDD (degenerative disc disease), Depression, Dyspnea, Fibromyalgia, GERD (gastroesophageal reflux disease), History of depression, Hypertension, Hypothyroidism, Leg pain, Migraine, Peripheral arterial disease (HCC), Peripheral vascular disease (HCC), Sciatica, Stroke (cerebrum) (HCC) (10/13/2017), Thyroid disease, TMJ (temporomandibular joint disorder) (08/24/1991), and Ulcer.  PAST SURGICAL HISTORY: She  has a past surgical history that includes Cholecystectomy (01/22/1997); Abdominal hysterectomy; Ovarian cyst surgery; aortogram w/ stenting (2007, 2011, Aug. 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  Allergies  Allergen Reactions  . Ativan [Lorazepam] Other (See Comments)    Patient's husband said related to being oversedated not true allergy    No current facility-administered medications on file prior to encounter.    Current Outpatient Medications on File Prior to Encounter  Medication Sig  . BLACK COHOSH PO Take 3 capsules by mouth daily.   . ergocalciferol (VITAMIN D2) 50000 units capsule Take 50,000 Units by mouth once a week. Monday  . HM ASPIRIN 81 MG chewable tablet Chew 81 mg by mouth 2 (two) times daily.   Marland Kitchen. levothyroxine (SYNTHROID, LEVOTHROID) 75 MCG tablet Take 75 mcg by mouth daily before breakfast.  . LINZESS 145 MCG CAPS capsule Take 145 mcg by mouth daily.  Marland Kitchen. lisinopril (PRINIVIL,ZESTRIL) 20 MG tablet Take 20 mg by mouth every morning.   Marland Kitchen. LYRICA 150 MG capsule Take 150 mg by mouth 2 (two) times daily.  . methocarbamol (ROBAXIN) 750 MG tablet Take 750 mg by mouth 3 (three) times daily.   . metoprolol succinate (TOPROL-XL) 25 MG 24 hr tablet Take 25 mg by mouth every morning.  .Marland Kitchen  oxyCODONE-acetaminophen (PERCOCET) 10-325 MG tablet Take 1 tablet by mouth every 6 (six) hours as needed for pain.  (Patient taking differently: Take 1 tablet by mouth 4 (four) times daily. )  . pravastatin (PRAVACHOL) 20 MG tablet Take 20 mg by mouth every morning.   . promethazine (PHENERGAN) 25 MG tablet Take 1 tablet by mouth every 6 (six) hours as needed.  . venlafaxine XR (EFFEXOR-XR) 75 MG 24 hr capsule Take 150 mg every morning by mouth.     FAMILY HISTORY:  Her indicated that her mother is deceased. She indicated that her father is deceased. She indicated that her sister is deceased. She indicated that the status of her neg hx is unknown.   SOCIAL HISTORY: She  reports that she has been smoking e-cigarettes.  She started smoking about 42 years ago. She has been smoking about 1.00 pack per day. she has never used smokeless tobacco. She reports that she does not drink alcohol or use drugs.  REVIEW OF SYSTEMS:   Per HPI  SUBJECTIVE:  Acute respiratory distress  VITAL SIGNS: BP 129/82 (BP Location: Left Arm)   Pulse 98   Temp 99 F (37.2 C) (Axillary)   Resp (!) 31   Ht 5\' 4"  (1.626 m)   Wt 61.9 kg (136 lb 7.4 oz)   SpO2 98%   BMI 23.42 kg/m   HEMODYNAMICS:    VENTILATOR SETTINGS:    INTAKE / OUTPUT: I/O last 3 completed shifts: In: 1610 [RU/EA:[id number] [NG/GT:4495; IV Piggyback:925] Out: 2130 [Urine:2030; Stool:100]  PHYSICAL EXAMINATION: General:  Thin W F mod. Respiratory distress Neuro:  L hemiparesis HEENT:  [place]/AT, PRRL dry OP Cardiovascular:  RRR, no murmurs Lungs:  Scattered rhonchi Abdomen:  Supple, no guarding, no organomegaly Musculoskeletal:  No inflamed joints Skin:  No C,C,E  LABS:  BMET Recent Labs  Lab 10/14/17 0514 10/15/17 0155 10/15/17 1414  NA 140 141 143  K 3.3* 3.4* 3.9  CL 113* 115* 118*  CO2 17* 18* 17*  BUN 20 19 19   CREATININE 0.78 0.78 0.78  GLUCOSE 182* 185* 207*    Electrolytes Recent Labs  Lab 10/11/17 0240  10/14/17 0514 10/15/17 0155 10/15/17 1414  CALCIUM 8.5*   < > 8.2* 8.1* 8.3*  MG 2.0  --   --   --  2.5*  PHOS 2.1*  --   --   --    --    < > = values in this interval not displayed.    CBC Recent Labs  Lab 10/13/17 0554 10/14/17 0514 10/15/17 0155  WBC 8.9 7.8 7.3  HGB 8.3* 8.7* 8.2*  HCT 25.4* 26.3* 25.7*  PLT 476* 514* 534*    Coag's No results for input(s): APTT, INR in the last 168 hours.  Sepsis Markers No results for input(s): LATICACIDVEN, PROCALCITON, O2SATVEN in the last 168 hours.  ABG Recent Labs  Lab 10/15/17 1420 10/15/17 1515 10/15/17 1754  PHART 7.499* 7.434 7.441  PCO2ART 22.7* 24.5* 25.7*  PO2ART 46.5* 89.0 58.0*    Liver Enzymes No results for input(s): AST, ALT, ALKPHOS, BILITOT, ALBUMIN in the last 168 hours.  Cardiac Enzymes No results for input(s): TROPONINI, PROBNP in the last 168 hours.  Glucose Recent Labs  Lab 10/14/17 1955 10/15/17 0011 10/15/17 0337 10/15/17 0615 10/15/17 1221 10/15/17 1626  GLUCAP 148* 183* 159* 175* 175* 135*    Imaging Dg Chest Port 1 View  Result Date: 10/15/2017 CLINICAL DATA:  Respiratory distress EXAM: PORTABLE CHEST 1 VIEW COMPARISON:  10/13/2017 FINDINGS:  Airspace opacity in the left lower lobe is stable and has been present since 10/02/2017, but was not present on exams prior to that including the chest CT from 10/02/17. Possibilities include atelectasis and pneumonia. Atherosclerotic calcification of the aortic arch. The right lung appears clear.  Heart size within normal limits. A feeding tube is noted entering the stomach. IMPRESSION: 1. Left lower lobe airspace opacity has been present since 10/02/2017 but not before that time. There is some associated left hemidiaphragmatic elevation and accordingly the airspace opacities probably from atelectasis, less likely pneumonia. 2. Feeding tube enters the stomach. 3.  Aortic Atherosclerosis (ICD10-I70.0). Electronically Signed   By: Gaylyn Rong M.D.   On: 10/15/2017 14:50     STUDIES:  PCXR - residual infiltrate vs atelectasis LLL  CULTURES: None  pending  ANTIBIOTICS: Nafcillin  LINES/TUBES: ETT, FT  DISCUSSION: 61 y.o. F with CVA, recent HAP with new hypoxemic respiratory failure, DDx include infection. Although on "adequate" anticoagulation, PE is a possibility with unchanged CXR. Will rePatient was electively intubated due to impending respiratory extremis.  ASSESSMENT / PLAN:  PULMONARY A: Hypoxemic respiratory failure P:   Will obtain CTA though have low-mod suspicion for PE. Cont mech. ventilatory support  CARDIOVASCULAR A:  Stable P:  Observe  RENAL A:   Normal indices P:   Follow  GASTROINTESTINAL A:   TF on hold P:   On famotidine per FT  HEMATOLOGIC A:   [place]/Azure anemia P:  Follow  INFECTIOUS A:   S/p HAP P:   Repeat cultures  ENDOCRINE A:   Modest hyperglycemia   P:   On SSi  NEUROLOGIC A:   CVA P:   RASS goal: -1 Per neurol  Critical care time; 60 min   Pulmonary and Critical Care Medicine [facility] Pager: [phone]  10/15/2017, 6:42 PM  Addendum: Would obtain CTA if D-Dimer is positve.

## 2017-10-15 NOTE — Progress Notes (Signed)
CCM notified of patients increase WOB and lower saturations on NRB. Will continue to monitor. Dicie BeamFrazier, Shyrl Obi RN BSN.

## 2017-10-16 LAB — BLOOD GAS, ARTERIAL
Acid-base deficit: 6.1 mmol/L — ABNORMAL HIGH (ref 0.0–2.0)
Bicarbonate: 17.6 mmol/L — ABNORMAL LOW (ref 20.0–28.0)
DRAWN BY: 50222
FIO2: 60
O2 SAT: 99.1 %
PEEP: 5 cmH2O
PH ART: 7.414 (ref 7.350–7.450)
Patient temperature: 98.6
RATE: 14 resp/min
VT: 440 mL
pCO2 arterial: 28.1 mmHg — ABNORMAL LOW (ref 32.0–48.0)
pO2, Arterial: 144 mmHg — ABNORMAL HIGH (ref 83.0–108.0)

## 2017-10-16 LAB — BASIC METABOLIC PANEL
ANION GAP: 10 (ref 5–15)
BUN: 30 mg/dL — ABNORMAL HIGH (ref 6–20)
CHLORIDE: 116 mmol/L — AB (ref 101–111)
CO2: 17 mmol/L — AB (ref 22–32)
Calcium: 7.8 mg/dL — ABNORMAL LOW (ref 8.9–10.3)
Creatinine, Ser: 1 mg/dL (ref 0.44–1.00)
GFR calc Af Amer: >60 mL/min (ref >60)
GFR calc non Af Amer: >60 mL/min (ref >60)
GLUCOSE: 169 mg/dL — AB (ref 65–99)
POTASSIUM: 3.3 mmol/L — AB (ref 3.5–5.1)
Sodium: 143 mmol/L (ref 135–145)

## 2017-10-16 LAB — GLUCOSE, CAPILLARY
GLUCOSE-CAPILLARY: 152 mg/dL — AB (ref 65–99)
GLUCOSE-CAPILLARY: 168 mg/dL — AB (ref 65–99)
GLUCOSE-CAPILLARY: 175 mg/dL — AB (ref 65–99)
GLUCOSE-CAPILLARY: 191 mg/dL — AB (ref 65–99)
Glucose-Capillary: 155 mg/dL — ABNORMAL HIGH (ref 65–99)
Glucose-Capillary: 163 mg/dL — ABNORMAL HIGH (ref 65–99)
Glucose-Capillary: 178 mg/dL — ABNORMAL HIGH (ref 65–99)
Glucose-Capillary: 94 mg/dL (ref 65–99)

## 2017-10-16 LAB — CBC
HCT: 25.3 % — ABNORMAL LOW (ref 36.0–46.0)
HEMOGLOBIN: 7.7 g/dL — AB (ref 12.0–15.0)
MCH: 29.6 pg (ref 26.0–34.0)
MCHC: 30.4 g/dL (ref 30.0–36.0)
MCV: 97.3 fL (ref 78.0–100.0)
Platelets: 499 10*3/uL — ABNORMAL HIGH (ref 150–400)
RBC: 2.6 MIL/uL — AB (ref 3.87–5.11)
RDW: 20.5 % — ABNORMAL HIGH (ref 11.5–15.5)
WBC: 6.1 10*3/uL (ref 4.0–10.5)

## 2017-10-16 LAB — MAGNESIUM: Magnesium: 2.3 mg/dL (ref 1.7–2.4)

## 2017-10-16 MED ORDER — SODIUM CHLORIDE 0.9 % IV SOLN
INTRAVENOUS | Status: DC
  Administered 2017-10-16 (×3): via INTRAVENOUS

## 2017-10-16 MED ORDER — SODIUM CHLORIDE 0.9 % IV BOLUS (SEPSIS)
500.0000 mL | Freq: Once | INTRAVENOUS | Status: AC
  Administered 2017-10-16: 500 mL via INTRAVENOUS

## 2017-10-16 MED ORDER — POTASSIUM CHLORIDE 20 MEQ/15ML (10%) PO SOLN
20.0000 meq | ORAL | Status: AC
  Administered 2017-10-16 (×2): 20 meq
  Filled 2017-10-16 (×2): qty 15

## 2017-10-16 MED ORDER — LACTATED RINGERS IV SOLN
INTRAVENOUS | Status: DC
  Administered 2017-10-16 – 2017-10-21 (×6): via INTRAVENOUS

## 2017-10-16 NOTE — Progress Notes (Signed)
[facility] FOR AM LAB REPLACEMENT ONLY  The patient does apply for the [facility] Electrolyte [facility] Protocol based on the criteria listed below:   1. Is GFR >/= 40 ml/min? Yes.    Patient's GFR today is >60 2. Is urine output >/= 0.5 ml/kg/hr for the last 6 hours? Yes.   Patient's UOP is 0.5 ml/kg/hr 3. Is BUN < 60 mg/dL? Yes.    Patient's BUN today is 30 4. Abnormal electrolyte(s):K+3.3 5. Ordered repletion with: Protocol 6. If a panic level lab has been reported, has the CCM MD in charge been notified? No..   Physician:  Rosalva FerronWert  Parrish Bonn Hilliard 10/16/2017 5:19 AM

## 2017-10-16 NOTE — Progress Notes (Signed)
PULMONARY / CRITICAL CARE MEDICINE   Name: Ashlee Snow MRN: [id number] DOB: 11-Oct-1956    ADMISSION DATE:  10/01/2017 CONSULTATION DATE:  10/02/2017  CHIEF COMPLAINT:  Acute hypoxemic respiratory failure  HISTORY OF PRESENT ILLNESS:   This [name] [facility] following her admission for fevers, confusion, N&V, and right flank pain.She has a past medical history of fibromyalgia, hypertension, and hypothyroidism who for several days prior to admission had apparently been suffering from nausea and vomiting.  She subsequently had fevers and was evaluated at an outside hospital where she was found to have left hydronephrosis.  A ureteral stent was placed and she was treated for urosepsis with antibiotics and generous fluids.  She grew MSSA from her blood. She was seen by PCCM as her oxygen requirements increased substantially on 12/10 and Levaquin  was added to nafcillin to cover for presumed aspiration. PCXR showed increasing infiltrate in the LLL  Zosyn was subsequently substituted for both.   Here hospital course was complicated by acute/subacute inferior right cerebellum and vermis infarct with associated hemorrhage. 2. Additional scattered punctate foci of acute nonhemorrhagic infarct involving the right PCA territory with 1 lesion involving the right MCA territory along the right middle frontal gyrus. 3. Multiple foci of remote encephalomalacia involving the inferior posterior right temporal lobe, lateral right temporal lobe, and high right parietal lobe. 4. 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 intubation, from which she was extubated on 12/19.  On 12/23 the patient became acutely hypoxemic with respiratory distress necessitating transfer to the ICU. ABGs on NRM were PO2 46.5, PCO2 22.7 and pH 7.499.    SUBJECTIVE:  Acute respiratory distress  VITAL SIGNS: BP (!) 101/59   Pulse 78   Temp  99.2 F (37.3 C) (Axillary)   Resp 20   Ht 5\' 4"  (1.626 m)   Wt 136 lb 11 oz (62 kg)   SpO2 100%   BMI 23.46 kg/m   HEMODYNAMICS:    VENTILATOR SETTINGS: Vent Mode: PRVC FiO2 (%):  [40 %-100 %] 40 % Set Rate:  [14 bmp-20 bmp] 20 bmp Vt Set:  [440 mL-500 mL] 500 mL PEEP:  [5 cmH20] 5 cmH20 Plateau Pressure:  [16 cmH20-19 cmH20] 16 cmH20  INTAKE / OUTPUT: I/O last 3 completed shifts: In: 7791.7 [I.V.:971.7; NG/GT:5895; IV Piggyback:925] Out: 3000 [Urine:3000]  PHYSICAL EXAMINATION: 61 year old white female currently sedated on Precedex infusion.  No acute distress. HEENT orally intubated, no jugular venous distention, feeding tube in right nare. Pulmonary: Scattered rhonchi, equal chest rise, no accessory use. Cardiac: Regular rate and rhythm Abdomen: Soft nontender Extremity: No significant edema brisk cap refill Neuro: Opens eyes, left-sided hemiparesis and neglect LABS:  BMET Recent Labs  Lab 10/15/17 0155 10/15/17 1414 10/16/17 0331  NA 141 143 143  K 3.4* 3.9 3.3*  CL 115* 118* 116*  CO2 18* 17* 17*  BUN 19 19 30*  CREATININE 0.78 0.78 1.00  GLUCOSE 185* 207* 169*    Electrolytes Recent Labs  Lab 10/11/17 0240  10/15/17 0155 10/15/17 1414 10/16/17 0331  CALCIUM 8.5*   < > 8.1* 8.3* 7.8*  MG 2.0  --   --  2.5* 2.3  PHOS 2.1*  --   --   --   --    < > = values in this interval not displayed.    CBC Recent Labs  Lab 10/14/17 0514 10/15/17 0155 10/16/17 0331  WBC  7.8 7.3 6.1  HGB 8.7* 8.2* 7.7*  HCT 26.3* 25.7* 25.3*  PLT 514* 534* 499*    Coag's No results for input(s): APTT, INR in the last 168 hours.  Sepsis Markers No results for input(s): LATICACIDVEN, PROCALCITON, O2SATVEN in the last 168 hours.  ABG Recent Labs  Lab 10/15/17 1515 10/15/17 1754 10/16/17 0412  PHART 7.434 7.441 7.414  PCO2ART 24.5* 25.7* 28.1*  PO2ART 89.0 58.0* 144*    Liver Enzymes No results for input(s): AST, ALT, ALKPHOS, BILITOT, ALBUMIN in the  last 168 hours.  Cardiac Enzymes No results for input(s): TROPONINI, PROBNP in the last 168 hours.  Glucose Recent Labs  Lab 10/15/17 1221 10/15/17 1626 10/15/17 1953 10/16/17 0027 10/16/17 0353 10/16/17 0756  GLUCAP 175* 135* 213* 191* 168* 163*    Imaging Dg Chest Port 1 View  Result Date: 10/15/2017 CLINICAL DATA:  Verify endotracheal tube. EXAM: PORTABLE CHEST 1 VIEW COMPARISON:  10/15/2017 FINDINGS: Endotracheal tube is 1.4 cm for the carina. Feeding tube extends into the abdomen. Persistent densities at the left lung base with increased consolidation along the medial left lung base. Heart size is stable and within normal limits. IMPRESSION: Endotracheal tube is positioned above the carina. Increased consolidation and densities at the left lung base. Electronically Signed   By: Richarda Overlie M.D.   On: 10/15/2017 19:17   Dg Chest Port 1 View  Result Date: 10/15/2017 CLINICAL DATA:  Respiratory distress EXAM: PORTABLE CHEST 1 VIEW COMPARISON:  10/13/2017 FINDINGS: Airspace opacity in the left lower lobe is stable and has been present since 10/02/2017, but was not present on exams prior to that including the chest CT from 10/21/2017. Possibilities include atelectasis and pneumonia. Atherosclerotic calcification of the aortic arch. The right lung appears clear.  Heart size within normal limits. A feeding tube is noted entering the stomach. IMPRESSION: 1. Left lower lobe airspace opacity has been present since 10/02/2017 but not before that time. There is some associated left hemidiaphragmatic elevation and accordingly the airspace opacities probably from atelectasis, less likely pneumonia. 2. Feeding tube enters the stomach. 3.  Aortic Atherosclerosis (ICD10-I70.0). Electronically Signed   By: Gaylyn Rong M.D.   On: 10/15/2017 14:50     STUDIES:  PCXR - residual infiltrate vs atelectasis LLL  CULTURES: Respiratory culture 12/23>>> Blood cultures 12/23>>> Blood cultures  12/18: Negative ANTIBIOTICS: Nafcillin 12/18>>>  LINES/TUBES: Endotracheal tube placed 12/23 With feeding tube placed 12/23  DISCUSSION: 61 y.o. F with CVA, recent HAP with new hypoxemic respiratory failure on 12/23 requiring intubation.  Suspect this was due to inability/ineffective airway protection.  Oxygenating well chest x-ray with left basilar atelectasis but no evidence of acute infection.  Unfortunately she has had large strokes with ineffective airway protection and left-sided neglect.  I suspect the only way we can successfully get her off the ventilator would be with a trach.  However her prognosis for functional recovery is poor at best.  ASSESSMENT / PLAN:  PULMONARY A: Hypoxemic respiratory failure suspect due to mucous plugging +/- aspiration also consider PE Portable chest x-ray reviewed demonstrates satisfactory endotracheal tube positioning.  Left basilar atelectasis/consolidation -Do not think this is thromboembolic disease she has been on anticoagulation.  Now oxygenating well -She has had difficulty maintaining her airway as a consequence of her stroke, she will need tracheostomy for airway protection if we are going to hope for successful extubation P:   Continue full ventilator support, minute ventilation change to allow some spontaneous ventilation Follow-up chest x-ray as  needed Will speak with family in regards to trach PEG versus one-way extubation Cont systemic LMWH  CARDIOVASCULAR A:  Hypotension P:  Trend telemetry Volume resuscitated, follow BP indices  RENAL A:   Non-anion gap metabolic acidosis Hyperkalemia Mild hyperchloremia P:   Replace potassium Change IV fluids to LR Follow-up a.m. chemistry Getting oral bicarb   GASTROINTESTINAL A:   Protein calorie malnutrition P:   Resume tube feeds Continue H2 blockade  HEMATOLOGIC A:   [place] anemia complicated by anemia of critical illness Acute LLE and RLE DVT 12/20 P:  Trend  CBC Transfuse per protocol  INFECTIOUS A:   S/p HAP MSSA bacteremia P:   Follow-up pending respiratory cultures obtained on 12/23 Antibiotic day #11, nafcillin started on 12/18 Further antibiotic recommendations per infectious disease  ENDOCRINE A:   Modest hyperglycemia   P:   Sliding scale insulin  NEUROLOGIC A:   CVA with left-sided hemiparesis embolic versus related to high-grade stenosis (large right cerebellar stroke with petechial hemorrhagic conversion, also acute large right MCA and PCA infarct).  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  Also question about endocarditis  P:   RASS goal is -1 Eventually will need TEE to rule out endocarditis as well as evaluate for PFO Continuing subcu heparin Aspirin for antiplatelet   Critical care time 35 minutes   Simonne MartinetPeter E Veona Bittman ACNP-BC [facility] Pulmonary/Critical Care Pager # [phone] OR # [phone] if no answer 10/16/2017, 9:03 AM

## 2017-10-16 NOTE — Progress Notes (Signed)
eLink Physician-Brief Progress Note Patient Name: Ashlee ParaDebra A Snow DOB: [birth year] MRN: [id number]   Date of Service  10/16/2017  HPI/Events of Note  Vent round revealed significant air trapping on vent at rate 30 and VT =400  eICU Interventions  rec increase Vt to 500 and reduce Rate to 20and check abg in one hour     Intervention Category Major Interventions: Respiratory failure - evaluation and management  Sandrea HughsMichael Nirali Magouirk 10/16/2017, 2:20 AM

## 2017-10-16 NOTE — Progress Notes (Signed)
eLink Physician-Brief Progress Note Patient Name: Ashlee ParaDebra A Snow DOB: [birth year] MRN: [id number]   Date of Service  10/16/2017  HPI/Events of Note  bp still soft, need precedex to control RR to prevent air trapping so can't reduce dose  eICU Interventions  NS 500 cc IV      Intervention Category Major Interventions: Hypotension - evaluation and management  Sandrea HughsMichael Rafik Koppel 10/16/2017, 4:12 AM

## 2017-10-16 NOTE — Progress Notes (Addendum)
Occupational Therapy Treatment Patient Details Name: Ashlee Snow MRN: [id number] DOB: 19-May-1956 Today's Date: 10/16/2017    History of present illness This is a 61 year old with a past medical history of fibromyalgia, HTN, and hypothyroidism . 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 Additional scattered punctate foci of acute nonhemorrhagic infarct involving the right PCA territory with 1 lesion involving the right MCA territory along the right middle frontal gyrus. Multiple foci of remote encephalomalacia involving the inferior posterior right temporal lobe, lateral right temporal lobe, and high right parietal lobe. Pt intubated 12/14 and extubated 12/19. On 12/20 found to have LLE + for acute DVT in the peroneal veins; RUE acute DVT in the distal right subclavian, axillary, proximal brachial veins.  Superficial vein thrombosis throughout the right cephalic and basilic veins.   OT comments  Pt progressing towards established OT goals and presents with increased arousal compared to prior sessions. Pt maintain sitting at EOB with Mod A to perform grooming.  Pt requiring Mod VCs to locate mouth swap near midline. Will continue to follow acutely to facilitate safe dc; goals updated. Continue to recommend dc to CIR for further OT.   Follow Up Recommendations  CIR;Supervision/Assistance - 24 hour    Equipment Recommendations  Other (comment)(TBD at next venue)    Recommendations for Other Services Rehab consult    Precautions / Restrictions Precautions Precautions: Fall Precaution Comments: Cortrak, ETT, vent, flexiseal, foley, LLE DVT, RUE DVT Restrictions Weight Bearing Restrictions: No       Mobility Bed Mobility Overal bed mobility: Needs Assistance Bed Mobility: Rolling;Sidelying to Sit;Sit to Supine Rolling: Mod assist Sidelying to sit: Max  assist   Sit to supine: Max assist;+2 for physical assistance   General bed mobility comments: max assist to move legs off of bed and elevate trunk, max +2 to return trunk to surface and bring legs up. Total assist to slide to [facility]  Transfers Overall transfer level: Needs assistance Equipment used: (2 person lift with gait belt and bed pad) Transfers: Sit to/from Stand Sit to Stand: Max assist;+2 physical assistance         General transfer comment: pt able to stand x 2 trials today with bil knees blocked, assist at trunk for support, use of pad to cradle sacrum with assist to slide up in bed with each sit    Balance Overall balance assessment: Needs assistance Sitting-balance support: Single extremity supported;Feet supported Sitting balance-Leahy Scale: Zero Sitting balance - Comments: EOB 9 min with assist for midline posture Postural control: Right lateral lean;Posterior lean Standing balance support: Bilateral upper extremity supported Standing balance-Leahy Scale: Zero                             ADL either performed or assessed with clinical judgement   ADL Overall ADL's : Needs assistance/impaired Eating/Feeding: NPO   Grooming: Bed level;Oral care Grooming Details (indicate cue type and reason): Pt requiring increased time and cues to reach for mouth swap on R side of midline. Pt able to bring swab to mouth and brush back and forth. Pt attending to L side of mouth as well with close supervision to protect tube.                             Functional mobility during ADLs: +2 for  physical assistance;Maximal assistance;Cueing for sequencing;Cueing for safety(sit<>stand to [facility] General ADL Comments: Pt performing oral care while supported in sitting with Mod A to maintain sitting balance. Pt fluctuating from needing Min-Max A for sitting balance during ADLs. Pt requiring increased itme and cues to attend to items near midline.  Pt with  tendency to give a thumbs up or "ok" in response to questions. Pt attending more to midline and occasional R side of evironment, but fatigues quickly. Maintained sitting ~ 9 minutes. While EOB, pt attempting to write and is able to grasp and hold pen with R hand. Pt's witting is small and unclear.      Vision   Additional Comments: Pt attending more to midline. However contintinues to have R gaze preference but not as strong as prior sessions.    Perception     Praxis      Cognition Arousal/Alertness: Awake/alert Behavior During Therapy: WFL for tasks assessed/performed;Flat affect Overall Cognitive Status: Difficult to assess Area of Impairment: Following commands;Safety/judgement;Problem solving                   Current Attention Level: Sustained   Following Commands: Follows one step commands inconsistently;Follows one step commands with increased time Safety/Judgement: Decreased awareness of safety;Decreased awareness of deficits Awareness: Intellectual Problem Solving: Slow processing;Decreased initiation;Difficulty sequencing;Requires verbal cues;Requires tactile cues General Comments: Pt requiring increased time and cues to follow commands. More alert than prior session and attending to L side. As pt fatigued through session, she began to gaze more to the R and required more cues to reach midline.        Exercises     Shoulder Instructions       General Comments      Pertinent Vitals/ Pain       Pain Assessment: Faces Faces Pain Scale: No hurt Pain Location: husband reports chronic low back pain. Pt did say "that hurts" when I did nail bed pressure to thumb and then pinky (as well as moving arm away from painful stimuli) Pain Descriptors / Indicators: Grimacing Pain Intervention(s): Monitored during session;Repositioned  Home Living                                          Prior Functioning/Environment              Frequency  Min  3X/week        Progress Toward Goals  OT Goals(current goals can now be found in the care plan section)  Progress towards OT goals: Progressing toward goals  Acute Rehab OT Goals Patient Stated Goal: husband "I would like for her to be able to walk out of her, but I also realize that probably is not going to happen" OT Goal Formulation: With family Time For Goal Achievement: 10/16/17 Potential to Achieve Goals: Good ADL Goals Pt Will Perform Grooming: with mod assist Pt Will Transfer to Toilet: with mod assist;with +2 assist;bedside commode;squat pivot transfer;stand pivot transfer Additional ADL Goal #1: Pt will follow 4 out of 5 one step commands without delay Additional ADL Goal #2: Pt will roll left and right with Mod A to A with basic ADLs Additional ADL Goal #3: Pt will be able to track left of midline in supine and supported sitting to work on scanning Additional ADL Goal #4: Pt will be able to sit EOB for 5 minutes with no  more than Mod A as precursor to basic ADLs in an unsupported seated position  Plan Discharge plan remains appropriate    Co-evaluation    PT/OT/SLP Co-Evaluation/Treatment: Yes Reason for Co-Treatment: Complexity of the patient's impairments (multi-system involvement)   OT goals addressed during session: ADL's and self-care      AM-PAC PT "6 Clicks" Daily Activity     Outcome Measure   Help from another person eating meals?: Total Help from another person taking care of personal grooming?: A Lot Help from another person toileting, which includes using toliet, bedpan, or urinal?: Total Help from another person bathing (including washing, rinsing, drying)?: Total Help from another person to put on and taking off regular upper body clothing?: Total Help from another person to put on and taking off regular lower body clothing?: Total 6 Click Score: 7    End of Session Equipment Utilized During Treatment: Gait belt  OT Visit Diagnosis: Other  abnormalities of gait and mobility (R26.89);Muscle weakness (generalized) (M62.81);Other symptoms and signs involving the nervous system (R29.898);Other symptoms and signs involving cognitive function;Cognitive communication deficit (R41.841);Hemiplegia and hemiparesis;Pain;Low vision, both eyes (H54.2) Symptoms and signs involving cognitive functions: Cerebral infarction Hemiplegia - Right/Left: Left Hemiplegia - dominant/non-dominant: Non-Dominant Hemiplegia - caused by: Cerebral infarction Pain - Right/Left: Right Pain - part of body: (back and LUE with nail bed pressure)   Activity Tolerance Patient tolerated treatment well   Patient Left in bed;with bed alarm set;with restraints reapplied;with call bell/phone within reach   Nurse Communication Mobility status        Time: [id number] OT Time Calculation (min): 31 min  Charges: OT General Charges $OT Visit: 1 Visit OT Treatments $Self Care/Home Management : 8-22 mins  Danyell Shader MSOT, OTR/L Acute Rehab Pager: [phone] Office: [phone]   Theodoro GristCharis M Kenisha Lynds 10/16/2017, 12:23 PM

## 2017-10-16 NOTE — Progress Notes (Signed)
Patient's RR increased from 14 to 20 by RN per Dr. Sherene SiresWert MD. Weaned FiO2 40%.

## 2017-10-16 NOTE — Progress Notes (Signed)
SLP Cancellation Note  Patient Details Name: Leane ParaDebra A Hemrick MRN: [id number] DOB: August 30, 1956   Cancelled treatment:       Reason Eval/Treat Not Completed: Medical issues which prohibited therapy. Pt again intubated, please reorder when appropriate.    Amoria Mclees, [name] 10/16/2017, 7:12 AM

## 2017-10-16 NOTE — Progress Notes (Signed)
eLink Physician-Brief Progress Note Patient Name: Leane ParaDebra A Boehme DOB: Mar 17, 1956 MRN: [id number]   Date of Service  10/16/2017  HPI/Events of Note  bp soft on low dose precedex with pulse in high 70s   Intake/Output Summary (Last 24 hours) at 10/16/2017 0201 Last data filed at 10/16/2017 0000 Gross per 24 hour  Intake 2733.76 ml  Output 1950 ml  Net 783.76 ml       Lab Results  Component Value Date   HGB 8.2 (L) 10/15/2017   HGB 8.7 (L) 10/14/2017   HGB 8.3 (L) 10/13/2017     eICU Interventions  NS bolus x 500      Intervention Category Major Interventions: Hypotension - evaluation and management  Sandrea HughsMichael Wert 10/16/2017, 2:01 AM

## 2017-10-16 NOTE — Progress Notes (Signed)
E-Link MD notified of no maintenance fluids ordered. NS @ 125 ml/h ordered.

## 2017-10-16 NOTE — Progress Notes (Signed)
ANTICOAGULATION CONSULT NOTE  Pharmacy Consult for Lovenox Indication: DVT  Allergies  Allergen Reactions  . Ativan [Lorazepam] Other (See Comments)    Patient's husband said related to being oversedated not true allergy   Patient Measurements: Height: 5\' 4"  (162.6 cm) Weight: 136 lb 11 oz (62 kg) IBW/kg (Calculated) : 54.7 Heparin Dosing Weight: 63.9 kg  Assessment: 61 yo female s/p R MCA and cerebellar stroke around 12/8 with mass effect and currently being treated for mssa bacteremia. On enoxaparin for multiple new DVTs. Hgb low at 7.7, plts 499.  Goal of Therapy:  Anti-Xa 0.6-1 units/mL Monitor platelets by anticoagulation protocol: Yes   Plan:  Continue enoxaparin 60mg  SQ Q12H Monitor CBC, s/s of bleed F/u plans for long-term anticoag - considering NOAC eventually  Enzo BiNathan Nathaneil Feagans, PharmD, [facility] CenterBCPS Clinical Pharmacist Pager [phone] 10/16/2017 7:38 AM

## 2017-10-16 NOTE — Progress Notes (Addendum)
Physical Therapy Treatment Patient Details Name: Ashlee Snow MRN: [id number] DOB: 24-Jul-1956 Today's Date: 10/16/2017    History of Present Illness This is a 61 year old with a past medical history of fibromyalgia, HTN, and hypothyroidism . 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 Additional scattered punctate foci of acute nonhemorrhagic infarct involving the right PCA territory with 1 lesion involving the right MCA territory along the right middle frontal gyrus. Multiple foci of remote encephalomalacia involving the inferior posterior right temporal lobe, lateral right temporal lobe, and high right parietal lobe. Pt intubated 12/14 and extubated 12/19. On 12/20 found to have LLE + for acute DVT in the peroneal veins; RUE acute DVT in the distal right subclavian, axillary, proximal brachial veins.  Superficial vein thrombosis throughout the right cephalic and basilic veins.    PT Comments    Pt alert on arrival and able to respond to commands today. Pt able to bend bil knees with delay to command. Pt with increased sitting balance, attention, responsive gaze and ability to stand with weight through bil LE today. Pt with ETT and maintained at 23 throughout. PRVC FiO2 40%. Will continue to follow to maximize function.  Pt attempting to use pen/paper to communicate but unclear what is written. Goals updated.     Follow Up Recommendations  SNF;Supervision/Assistance - 24 hour     Equipment Recommendations  Wheelchair cushion (measurements PT);Wheelchair (measurements PT);Hospital bed    Recommendations for Other Services       Precautions / Restrictions Precautions Precautions: Fall Precaution Comments: Cortrak, ETT, vent, flexiseal, foley, LLE DVT, RUE DVT    Mobility  Bed Mobility Overal bed mobility: Needs Assistance Bed Mobility:  Rolling;Sidelying to Sit;Sit to Supine Rolling: Mod assist Sidelying to sit: Max assist   Sit to supine: Max assist;+2 for physical assistance   General bed mobility comments: max assist to move legs off of bed and elevate trunk, max +2 to return trunk to surface and bring legs up. [facility]  Transfers Overall transfer level: Needs assistance   Transfers: Sit to/from Stand Sit to Stand: Max assist;+2 physical assistance         General transfer comment: pt able to stand x 2 trials today with bil knees blocked, assist at trunk for support, use of pad to cradle sacrum with assist to slide up in bed with each sit  Ambulation/Gait                 Stairs            Wheelchair Mobility    Modified Rankin (Stroke Patients Only)       Balance Overall balance assessment: Needs assistance Sitting-balance support: Single extremity supported;Feet supported Sitting balance-Leahy Scale: Zero Sitting balance - Comments: EOB 9 min with assist for midline posture Postural control: Right lateral lean;Posterior lean Standing balance support: Bilateral upper extremity supported Standing balance-Leahy Scale: Zero                              Cognition Arousal/Alertness: Awake/alert Behavior During Therapy: Flat affect Overall Cognitive Status: Difficult to assess Area of Impairment: Following commands;Safety/judgement;Problem solving                   Current Attention Level: Focused   Following Commands: Follows one step commands inconsistently;Follows one step commands with increased  time Safety/Judgement: Decreased awareness of safety;Decreased awareness of deficits Awareness: Intellectual Problem Solving: Slow processing;Decreased initiation;Difficulty sequencing;Requires verbal cues;Requires tactile cues General Comments: pt with tracking today with roving eyes at times. pt using RUE to attempt to write, communicating signing  "OK", using RUE to swab mouth with assist on command      Exercises      General Comments        Pertinent Vitals/Pain Pain Assessment: (CPOT= 0)    Home Living                      Prior Function            PT Goals (current goals can now be found in the care plan section) Progress towards PT goals: Progressing toward goals    Frequency           PT Plan Current plan remains appropriate    Co-evaluation PT/OT/SLP Co-Evaluation/Treatment: Yes Reason for Co-Treatment: Complexity of the patient's impairments (multi-system involvement);For patient/therapist safety;Necessary to address cognition/behavior during functional activity          AM-PAC PT "6 Clicks" Daily Activity  Outcome Measure  Difficulty turning over in bed (including adjusting bedclothes, sheets and blankets)?: Unable Difficulty moving from lying on back to sitting on the side of the bed? : Unable Difficulty sitting down on and standing up from a chair with arms (e.g., wheelchair, bedside commode, etc,.)?: Unable Help needed moving to and from a bed to chair (including a wheelchair)?: Total Help needed walking in hospital room?: Total Help needed climbing 3-5 steps with a railing? : Total 6 Click Score: 6    End of Session   Activity Tolerance: Patient tolerated treatment well Patient left: in bed;with call bell/phone within reach;with bed alarm set;with restraints reapplied Nurse Communication: Mobility status;Need for lift equipment PT Visit Diagnosis: Unsteadiness on feet (R26.81);Other symptoms and signs involving the nervous system (R29.898);Other abnormalities of gait and mobility (R26.89);Muscle weakness (generalized) (M62.81)     Time: [id number] PT Time Calculation (min) (ACUTE ONLY): 31 min  Charges:  $Therapeutic Activity: 8-22 mins                    G Codes:       Delaney MeigsMaija Tabor Tylar Merendino, PT [phone]    Callen Zuba B Koraima Albertsen 10/16/2017, 11:55 AM

## 2017-10-16 NOTE — Progress Notes (Signed)
STROKE TEAM PROGRESS NOTE   SUBJECTIVE (INTERVAL HISTORY) No one is at bedside. Pt was reintubated yesterday evening due to respiratory distress and difficulty protecting her airway. She is lightly sedated on propofol but is responsive and follows simple commands    OBJECTIVE Lab Results: CBC:  Recent Labs  Lab 10/14/17 0514 10/15/17 0155 10/16/17 0331  WBC 7.8 7.3 6.1  HGB 8.7* 8.2* 7.7*  HCT 26.3* 25.7* 25.3*  MCV 92.6 93.8 97.3  PLT 514* 534* 499*   BMP: Recent Labs  Lab 10/11/17 0240  10/13/17 0554 10/14/17 0514 10/15/17 0155 10/15/17 1414 10/16/17 0331  NA 140   < > 139 140 141 143 143  K 3.9   < > 2.8* 3.3* 3.4* 3.9 3.3*  CL 116*   < > 112* 113* 115* 118* 116*  CO2 13*   < > 18* 17* 18* 17* 17*  GLUCOSE 167*   < > 190* 182* 185* 207* 169*  BUN 22*   < > 20 20 19 19  30*  CREATININE 1.06*   < > 0.82 0.78 0.78 0.78 1.00  CALCIUM 8.5*   < > 8.3* 8.2* 8.1* 8.3* 7.8*  MG 2.0  --   --   --   --  2.5* 2.3  PHOS 2.1*  --   --   --   --   --   --    < > = values in this interval not displayed.   Liver Function Tests:  No results for input(s): AST, ALT, ALKPHOS, BILITOT, PROT, ALBUMIN in the last 168 hours. No results for input(s): AMMONIA in the last 168 hours. Cardiac Enzymes:  No results for input(s): CKTOTAL, CKMB, CKMBINDEX, TROPONINI in the last 168 hours. Coagulation Studies:  No results for input(s): APTT, INR in the last 72 hours. PHYSICAL EXAM Temp:  [96.1 F (35.6 C)-100.3 F (37.9 C)] 100.3 F (37.9 C) (12/24 1200) Pulse Rate:  [68-101] 76 (12/24 1300) Resp:  [17-40] 19 (12/24 1300) BP: (82-137)/(50-82) 102/54 (12/24 1300) SpO2:  [93 %-100 %] 100 % (12/24 1300) FiO2 (%):  [40 %-100 %] 40 % (12/24 1138) Weight:  [136 lb 11 oz (62 kg)] 136 lb 11 oz (62 kg) (12/24 0500)   Vitals:   10/16/17 0800 10/16/17 1138 10/16/17 1200 10/16/17 1300  BP: (!) 101/59  (!) 93/59 (!) 102/54  Pulse: 78 76 69 76  Resp: 20 (!) 30 (!) 23 19  Temp: 99.2 F (37.3 C)   100.3 F (37.9 C)   TempSrc: Axillary  Axillary   SpO2: 100% 100% 100% 100%  Weight:      Height:         General - middle-age Caucasian lady, not in acute distress  Ophthalmologic - Fundi not visualized due to noncooperation.  Cardiovascular - Regular rate and rhythm.  Neuro -patient is intubated and sedated awake alert, eyes spontaneously open, follows only simple commands.  PERRL, not blinking to visual threat on the left, eyes right gaze preference but able to cross midline, left facial droop. Did   follow commands with tongue protrusion. RUE and RLE spontaneous movement against gravity movement, LUE mild extension and LLE 3-/5 withdraw on pain. DTR 1+ and positive babinski bilaterally. Sensation, coordination not cooperative and gait not tested.   IMAGING  I have personally reviewed the radiological images below and agree with the radiology interpretations.  Ct Head Wo Contrast 10/01/2017 IMPRESSION:  No significant change since yesterday. Acute infarction affecting the right cerebellum with swelling. Mass-effect upon  the fourth ventricle but no change in size the lateral or third ventricles at this time. No hemorrhage. 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     MRI/MRA Brain Wo Contrast 10/01/2017 IMPRESSION:  1. Acute/subacute inferior right cerebellum and vermis infarct with associated hemorrhage.  2. Additional scattered punctate foci of acute nonhemorrhagic infarct involving the right PCA territory with 1 lesion involving the right MCA territory along the right middle frontal gyrus.  3. Multiple foci of remote encephalomalacia involving the inferior posterior right temporal lobe, lateral right temporal lobe, and high right parietal lobe.  4. 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 The right A1 and likely M1 segments fill via a patent anterior communicating artery.  5. Fenestration of the vertebrobasilar  junction without significant basilar artery stenosis.  6. Marked attenuation of PCA branch vessels, worse on the right. Moderate proximal right PCA stenosis. 7. Fetal type left posterior cerebral artery.    Echocardiogram:  - Left ventricle: The cavity size was normal. Wall thickness was   increased in a pattern of mild LVH. Systolic function was normal.   The estimated ejection fraction was in the range of 60% to 65%.   Wall motion was normal; there were no regional wall motion   abnormalities. Doppler parameters are consistent with abnormal   left ventricular relaxation (grade 1 diastolic dysfunction). - Mitral valve: There was mild regurgitation.  B/L Carotid U/S:                                                Right ECA and ICA waveforms are very similar. Unable to interrogate stenosis.  40-59% left ICA stenosis. Bilateral vertebral artery flow is antegrade.   CT Head: 10/02/17                                                    1. No significant change in the appearance of acute to subacute, hemorrhagic right inferior cerebellar infarct with slight positive mass effect on the adjacent fourth ventricle. No dilatation the third or lateral ventricles. 2. Chronic right parietal, posteromedial temporal and occipital lobe infarcts as well as right periventricular white matter tract lacunar infarct.    EEG 10/02/17:   This normal EEG is recorded in the waking state      Ct Head Wo Contrast 10/06/2017 IMPRESSION:  1. New, large area of cytotoxic edema within the posterior right hemisphere, involving the posterior parietal and temporal lobes and the occipital lobe. This includes portions of the posterior right MCA territory and the PCA territory.  2. Unchanged cytotoxic edema of the right cerebellar hemisphere with mass effect on the fourth ventricle.  3. No herniation, hydrocephalus or hemorrhage.    Ct Angio Head and neck W Or Wo Contrast 10/09/2017 IMPRESSION:  1. Occluded right  cavernous ICA and occluded or severely stenotic right M1 segment. Hypertrophic lenticulostriate on the right with prompt filling of right MCA branches, a chronic appearance.   The right cervical ICA is diffusely diminutive, presumably secondary.  2. Right P2 occlusion.  3. Moderate to advanced left paraclinoid ICA segment narrowing.  4. Poor visualization of the right V1 segment due to artifact and/or atheromatous narrowing.  5. Known acute infarcts in the right posterior cerebrum and right cerebellum. No hydrocephalus, acute hemorrhage, or visible progression.   LE venous doppler -  Right leg, negative for deep and superficial vein thrombosis.  Left leg, positive for acute deep vein thrombosis involving the peroneal veins   UE venous doppler -  Acute deep vein thrombosis in the distal right subclavian, axillary, proximal brachial veins. Superficial vein thrombosis throughout the right cephalic and basilic veins.   Somewhat difficult exam due to patient position and cooperation                ASSESSMENT: Ms. Ashlee Snow is a 61 y.o. female with PMH of prior hypertension, coronary artery disease, hypothyroidism, chronic pain, depression, smoker and peripheral arterial disease status post aorto-iliac bypass in June admitted for fever, confusion, right flank pain. Found to have right pyelonephritis and hydronephrosis. Also MRI showed right cerebellar large infarcts with right MCA scattered infarcts. B Cx showed MSSA. On Abx. However, repeat CT head on 10/06/17 showed new large right MCA infarcts. Concerning for endocarditis.    Acute, large Right cerebellar stroke with petechial hemorrhagic conversion as well as acute large right MCA and PCA infarct - concerning for endocarditis, but also found to have High-grade stenosis or occlusion of the cavernous right internal carotid artery.    Resultant - intubated on sedation, left hemiparesis -> extubated 10/11/2017 but reintubated 10/15/17  CT  head 10/02/17 right cerebellar large infarct  MRI head 10/01/17 right cerebellar large infarct and right MCA scattered small infarcts. Old right PCA, right caudate, right parietal high convexity  MRA head - right ICA cavernous high grade stenosis vs. Occlusion, b/l M1 stenosis, right PCA distal stenosis  CT head 10/06/17 - new large right MCA and PCA infarct with cytotoxic edema  CTA head and neck - right ICA occlusion, right M1 high grade stenosis vs. Occlusion, right P2 occlusion and right V1 high grade stenosis, no mycotic aneurysms  CUS - waveform consistent with right ICA distal stenosis vs. Occlusion  EEG - 10/02/2017 - no abnormalities  TTE EF 60-65%, no vegetation  Consider TEE to rule out endocarditis and also evaluate PFO -cardiology initially turned down request for TEE will shoot for next Monday  Heparin subq for DVT prophylaxis  NPO for now  No antithrombotics PTA, now on ASA 325mg .  Therapy recommendation - CIR recommended  Deposition - pending  Sepsis with pyelonephritis and bacteremia - ? Endocarditis  Intermittent high grade fever with low BP  Admitted for pyelonephritis with hydronephrosis confirmed on CT  B Cx positive for MSSA 10/03/17, repeat culture neg  Urine culture neg  Sputum culture neg  On nafcillin  Due to recurrent stroke, concerning for endocarditis   Consider TEE - shoot for next Monday  Dr Elease Hashimoto saw pt 10/07/2017 for possible TEE  Hematuria   Continued hematuria  H&H stable - Hb 8.4->9.6  Continue monitoring  UA 10/06/17 - RBC TNTC, WBC 6-30  CCM on board  Right LE and UE acute DVT  Venous doppler confirmed RLE and RUE DVT -> full dose lovenox  Discussed with Dr. Odessa Fleming, was on heparin IV but agree to change to lovenox due to IV access  Consider DOACs once medically stable and no planned procedure  Will need TEE or TCD bubble study later to evaluate PFO  AKI, resolved  Cre  1.64->2.01->1.36->1.73->1.23->1.05->1.13->1.06->0.81->0.78  On IVF and TF  CCM on board   Continue to monitor  Intracranial atherosclerosis and stenosis:  MRA  showed right ICA distal occlusion, b/l M1 stenosis, right PCA distal stenosis  CTA head and neck - right ICA occlusion, right M1 high grade stenosis vs. Occlusion, right P2 occlusion and right V1 high grade stenosis,  CUS consistent with right ICA distal stenosis or occlusion  On ASA now  May consider DAPT on discharge if pt hematuria improves  Will consider cerebral angiogram to further assess right ICA and MCA occlusion/stenosis once more stabilized  Avoid hypotension  HTN / hypotension  Stable now  D/c'ed amlodipine  Resume TF and continue IVF  BP goal 130-150 due to right ICA occlusion  Avoid hypotension  HYPERLIPIDEMIA  Home Meds:  Pravachol 20 mg  LDL 46,  goal < 70  AST 183->74  ALT 72->22  Resumed pravachol 20  TOBACCO ABUSE  Current smoker  Smoking cessation counseling when applicable  Nicotine patch PRN  Other Active Problems:  Hypernatremia - resolved  Hyperglycemia - improved  Hypoxia -  Seen by CCM 10/13/2017 - pt to remain in unit for now (extubated 10/11/2017)  May not be ready for TEE Monday. Dr Elease Hashimoto saw pt 10/10/17 - note deleted by Dr [facility] day # 16  I have personally examined this patient, reviewed notes, independently viewed imaging studies, participated in medical decision making and plan of care.ROS completed by me personally and pertinent positives fully documented  I have made any additions or clarifications directly to the above note.   No family available at the bedside for discussion. Discuss with Dr. Ninfa Linden. This patient is critically ill and at significant risk of neurological worsening, death and care requires constant monitoring of vital signs, hemodynamics,respiratory and cardiac monitoring, extensive review of multiple databases, frequent  neurological assessment, discussion with family, other specialists and medical decision making of high complexity.I have made any additions or clarifications directly to the above note.This critical care time does not reflect procedure time, or teaching time or supervisory time of PA/NP/Med Resident etc but could involve care discussion time.  I spent 30 minutes of neurocritical care time  in the care of  this patient.     Delia Heady, MD Medical Director [facility] Pager: [phone] 10/16/2017 2:55 PM     To contact Stroke Continuity provider, please refer to [email or web address]. After hours, contact General Neurology

## 2017-10-17 ENCOUNTER — Other Ambulatory Visit: Payer: Self-pay

## 2017-10-17 DIAGNOSIS — Z515 Encounter for palliative care: Secondary | ICD-10-CM

## 2017-10-17 DIAGNOSIS — Z7189 Other specified counseling: Secondary | ICD-10-CM

## 2017-10-17 DIAGNOSIS — J9601 Acute respiratory failure with hypoxia: Secondary | ICD-10-CM

## 2017-10-17 DIAGNOSIS — J9600 Acute respiratory failure, unspecified whether with hypoxia or hypercapnia: Secondary | ICD-10-CM

## 2017-10-17 DIAGNOSIS — T17908D Unspecified foreign body in respiratory tract, part unspecified causing other injury, subsequent encounter: Secondary | ICD-10-CM

## 2017-10-17 LAB — URINE CULTURE: Culture: NO GROWTH

## 2017-10-17 LAB — CBC
HCT: 24.3 % — ABNORMAL LOW (ref 36.0–46.0)
HEMOGLOBIN: 7.6 g/dL — AB (ref 12.0–15.0)
MCH: 30.4 pg (ref 26.0–34.0)
MCHC: 31.3 g/dL (ref 30.0–36.0)
MCV: 97.2 fL (ref 78.0–100.0)
PLATELETS: 371 10*3/uL (ref 150–400)
RBC: 2.5 MIL/uL — AB (ref 3.87–5.11)
RDW: 20.6 % — ABNORMAL HIGH (ref 11.5–15.5)
WBC: 6.6 10*3/uL (ref 4.0–10.5)

## 2017-10-17 LAB — BASIC METABOLIC PANEL
Anion gap: 9 (ref 5–15)
BUN: 22 mg/dL — ABNORMAL HIGH (ref 6–20)
CALCIUM: 8 mg/dL — AB (ref 8.9–10.3)
CO2: 15 mmol/L — AB (ref 22–32)
Chloride: 115 mmol/L — ABNORMAL HIGH (ref 101–111)
Creatinine, Ser: 0.78 mg/dL (ref 0.44–1.00)
Glucose, Bld: 206 mg/dL — ABNORMAL HIGH (ref 65–99)
POTASSIUM: 4 mmol/L (ref 3.5–5.1)
Sodium: 139 mmol/L (ref 135–145)

## 2017-10-17 LAB — GLUCOSE, CAPILLARY
Glucose-Capillary: 165 mg/dL — ABNORMAL HIGH (ref 65–99)
Glucose-Capillary: 168 mg/dL — ABNORMAL HIGH (ref 65–99)
Glucose-Capillary: 171 mg/dL — ABNORMAL HIGH (ref 65–99)
Glucose-Capillary: 185 mg/dL — ABNORMAL HIGH (ref 65–99)
Glucose-Capillary: 191 mg/dL — ABNORMAL HIGH (ref 65–99)
Glucose-Capillary: 193 mg/dL — ABNORMAL HIGH (ref 65–99)

## 2017-10-17 LAB — MAGNESIUM: Magnesium: 2.2 mg/dL (ref 1.7–2.4)

## 2017-10-17 MED ORDER — POTASSIUM CHLORIDE 20 MEQ PO PACK
40.0000 meq | PACK | Freq: Once | ORAL | Status: AC
  Administered 2017-10-17: 40 meq via ORAL
  Filled 2017-10-17: qty 2

## 2017-10-17 NOTE — Consult Note (Signed)
Consultation Note Date: 10/17/2017   Patient Name: Ashlee Snow  DOB: 1956-01-18  MRN: [id number]  Age / Sex: 61 y.o., female  PCP: Monico Blitz, MD Referring Physician: Jamesetta So, MD  Reason for Consultation: Establishing goals of care and Psychosocial/spiritual support  HPI/Patient Profile: 61 y.o. female   admitted on 10/02/2017 with PMH significant for hypertension, coronary artery disease, hypothyroidism, chronic pain, depression, smoker and peripheral arterial disease status post aorto-iliac bypass in June admitted for fever, confusion, right flank pain.   Husband reports that the patient has continued to decline physically and functionally over the past 9 months.  Tells me she stayed in the bed most of the time  Found to have right pyelonephritis and hydronephrosis.   Also MRI showed right cerebellar large infarcts with right MCA scattered infarcts. B Cx showed MSSA. On Abx. Repeat CT head on 10/06/17 showed new large right MCA infarcts.   Family face advance directive decisions, treatment option decisions, and anticipatory care needs.   Clinical Assessment and Goals of Care:  This NP Wadie Lessen reviewed medical records, received report from team, assessed the patient and then meet at the patient's bedside along with her husband, son/Jason and Jeanmarie Plant, and patient's brother/ Billy by telephone to discuss diagnosis, prognosis, GOC, EOL wishes disposition and options.  The difference between a aggressive medical intervention path  and a palliative comfort care path for this patient at this time was had.  A detailed discussion was had today regarding advanced directives.  Concepts specific to code status, artifical feeding and hydration, continued IV antibiotics and rehospitalization was had.   Discussed anticipatory care needs of long term need for ventilation and 24/7 nursing  care  Detail the likely trajectory and expectations into the future for this patient.  Values and goals of care important to patient and family were attempted to be elicited.    Questions and concerns addressed.   Family encouraged to call with questions or concerns.  PMT will continue to support holistically.    NEXT OF KIN/husband    SUMMARY OF RECOMMENDATIONS    Code Status/Advance Care Planning:  Full code  Palliative Prophylaxis:   Aspiration, Delirium Protocol, Frequent Pain Assessment and Oral Care  Additional Recommendations (Limitations, Scope, Preferences):  * continue current medical interventions until family has time to make decisions regarding plan of care   Psycho-social/Spiritual:   Emotional support offered to husband and family as a begin process the long-term poor prognosis for this patient.  Husband is intermittently tearful as he verbalizes what difficult decisions are  facing him.  He speaks to the fact that he and his wife had discussion in the past reflecting the patient's desire to "never wanting to live in a nursing home".  He verbalizes that his wife has been very sick and has suffered for some time.  He does not want to prolong suffering in any way but of course wants to be confident in any decisions he makes for his wife's medical care.  Husband willl continued  to process the situation and the decisions he faces regarding full aggressive care involving trach and PEG versus one-way extubation focusing on comfort and dignity.  At this nurse practitioner and husband plan to meet again tomorrow when he arrives at the hospital for further conversation regarding plan of care and emotional support.  Patient has 2 sons; one son is incarcerated in [place].  Son that is present today understandably is having a difficult time with the current situation.  Mr. Tech worries about how he will handle his mother's death.   Desire for further Chaplaincy  support:no  Additional Recommendations: Grief/Bereavement Support  Prognosis:   Unable to determine  Discharge Planning: To Be Determined      Primary Diagnoses: Present on Admission: . Stroke (cerebrum) ([place]) . AKI (acute kidney injury) ([place]) . Obstructive pyelonephritis . Thrombocytopenia ([place]) . Hypokalemia . Lactic acidosis   I have reviewed the medical record, interviewed the patient and family, and examined the patient. The following aspects are pertinent.  Past Medical History:  Diagnosis Date  . Asthma   . Bronchitis   . DDD (degenerative disc disease)   . Depression   . Dyspnea    on exertion  . Fibromyalgia   . GERD (gastroesophageal reflux disease)    hx. of   . History of depression   . Hypertension   . Hypothyroidism   . Leg pain   . Migraine   . Peripheral arterial disease ([place])   . Peripheral vascular disease ([place])   . Sciatica   . 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 Thyroid disease   . 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 Ulcer    Social History   Socioeconomic History  . Marital status: Married    Spouse name: None  . Number of children: None  . Years of education: None  . Highest education level: None  Social Needs  . Financial resource strain: None  . Food insecurity - worry: None  . Food insecurity - inability: None  . Transportation needs - medical: None  . Transportation needs - non-medical: None  Occupational History  . None  Tobacco Use  . Smoking status: Current Every Day Smoker    Packs/day: 1.00    Types: E-cigarettes    Start date: 08/03/1975  . Smokeless tobacco: Never Used  . Tobacco comment: Vaper cigarettes "juice", 74m.  Substance and Sexual Activity  . Alcohol use: No  . Drug use: No  . Sexual activity: None  Other Topics Concern  . None  Social History Narrative  . None   Family History  Problem Relation Age of Onset  . Heart disease Mother   . Cancer  Mother        lung  . Arthritis Mother   . Diabetes Mother   . Stroke Mother   . Hypertension Mother   . Heart disease Father   . Arthritis Father   . Heart failure Father   . Hypertension Father   . Stroke Father   . Peripheral vascular disease Father   . Heart disease Sister   . Arthritis Sister   . Kidney disease Sister   . Stroke Sister   . Fibromyalgia Sister   . Colon cancer Neg Hx    Scheduled Meds: . aspirin  325 mg Per Tube Daily  . chlorhexidine gluconate (MEDLINE KIT)  15 mL Mouth Rinse BID  . enoxaparin (LOVENOX) injection  60 mg Subcutaneous Q12H  . famotidine  20 mg Per Tube BID  . free water  100 mL Per Tube Q8H  . insulin aspart  0-15 Units Subcutaneous Q4H  . insulin aspart  2 Units Subcutaneous Q6H  . lactobacillus  1 g Per Tube Daily  . levothyroxine  50 mcg Intravenous Daily  . mouth rinse  15 mL Mouth Rinse 10 times per day  . pravastatin  20 mg Per Tube [id number]  . sodium bicarbonate  650 mg Per Tube BID  . thiamine  100 mg Per Tube Daily   Continuous Infusions: . dexmedetomidine (PRECEDEX) IV infusion 1.002 mcg/kg/hr (10/17/17 1300)  . feeding supplement (VITAL AF 1.2 CAL) 1,000 mL (10/17/17 1300)  . lactated ringers 75 mL/hr at 10/17/17 1300  . nafcillin IV Stopped (10/17/17 1140)   PRN Meds:.[DISCONTINUED] acetaminophen **OR** acetaminophen (TYLENOL) oral liquid 160 mg/5 mL **OR** acetaminophen, albuterol, fentaNYL (SUBLIMAZE) injection, loperamide Medications Prior to Admission:  Prior to Admission medications   Medication Sig Start Date End Date Taking? Authorizing Provider  BLACK COHOSH PO Take 3 capsules by mouth daily.    Yes [provider]  ergocalciferol (VITAMIN D2) 50000 units capsule Take 50,000 Units by mouth once a week. Monday   Yes [provider]  HM ASPIRIN 81 MG chewable tablet Chew 81 mg by mouth 2 (two) times daily.  03/17/17  Yes [provider]  levothyroxine (SYNTHROID, LEVOTHROID) 75 MCG tablet  Take 75 mcg by mouth daily before breakfast.   Yes [provider]  LINZESS 145 MCG CAPS capsule Take 145 mcg by mouth daily. 06/01/17  Yes [provider]  lisinopril (PRINIVIL,ZESTRIL) 20 MG tablet Take 20 mg by mouth every morning.    Yes [provider]  LYRICA 150 MG capsule Take 150 mg by mouth 2 (two) times daily. 03/17/17  Yes [provider]  methocarbamol (ROBAXIN) 750 MG tablet Take 750 mg by mouth 3 (three) times daily.  12/25/13  Yes [provider]  metoprolol succinate (TOPROL-XL) 25 MG 24 hr tablet Take 25 mg by mouth every morning.   Yes [provider]  oxyCODONE-acetaminophen (PERCOCET) 10-325 MG tablet Take 1 tablet by mouth every 6 (six) hours as needed for pain. Patient taking differently: Take 1 tablet by mouth 4 (four) times daily.  04/20/17  Yes Virgina Jock A, PA-C  pravastatin (PRAVACHOL) 20 MG tablet Take 20 mg by mouth every morning.    Yes [provider]  promethazine (PHENERGAN) 25 MG tablet Take 1 tablet by mouth every 6 (six) hours as needed. 03/17/17  Yes [provider]  venlafaxine XR (EFFEXOR-XR) 75 MG 24 hr capsule Take 150 mg every morning by mouth.  03/17/17  Yes [provider]   Allergies  Allergen Reactions  . Ativan [Lorazepam] Other (See Comments)    Patient's husband said related to being oversedated not true allergy   Review of Systems  Unable to perform ROS: Acuity of condition    Physical Exam  Constitutional: She appears cachectic. She appears ill. She is sedated and intubated.  Cardiovascular: Normal rate, regular rhythm and normal heart sounds.  Pulmonary/Chest: She is intubated.  Skin: Skin is warm and dry.    Vital Signs: BP 112/60   Pulse 72   Temp 99.9 F (37.7 C) (Axillary)   Resp (!) 24   Ht _0  (1.626 m)   Wt 63.6 kg (140 lb 3.4 oz)   SpO2 100%   BMI 24.07 kg/m  Pain Assessment: CPOT   Pain Score: Asleep  SpO2: SpO2: 100 % O2  Device:SpO2: 100 % O2 Flow Rate: .O2 Flow Rate (L/min): 40 L/min  IO: Intake/output summary:   Intake/Output Summary (Last 24 hours) at 10/17/2017 1326 Last data filed at 10/17/2017 1300 Gross per 24 hour  Intake 4205.07 ml  Output 2000 ml  Net 2205.07 ml    LBM: Last BM Date: 10/17/17 Baseline Weight: Weight: 65.7 kg (144 lb 13.5 oz) Most recent weight: Weight: 63.6 kg (140 lb 3.4 oz)     Palliative Assessment/Data: 30 %  At best   Discussed with Dr Milon Dikes, palliative medicine team will continue to support holistically  Time In: 1200 Time Out: 1315 Time Total:  75 minutes Greater than 50%  of this time was spent counseling and coordinating care related to the above assessment and plan.  Signed by: Wadie Lessen, NP   Please contact Palliative Medicine Team phone at [phone] for questions and concerns.  For individual provider: See Shea Evans

## 2017-10-17 NOTE — Progress Notes (Addendum)
PULMONARY / CRITICAL CARE MEDICINE   Name: Ashlee Snow MRN: [id number] DOB: 1956-08-03    ADMISSION DATE:  10-30-2017 CONSULTATION DATE:  09/26/2017  CHIEF COMPLAINT:  Acute hypoxemic respiratory failure  HISTORY OF PRESENT ILLNESS:   This [name] was initially seen in consultation by the [facility] service 12/13 following her admission for fevers, confusion, N&V, and right flank pain.She has a past medical history of fibromyalgia, hypertension, and hypothyroidism who for several days prior to admission had apparently been suffering from nausea and vomiting.  She subsequently had fevers and was evaluated at an outside hospital where she was found to have left hydronephrosis.  A ureteral stent was placed and she was treated for urosepsis with antibiotics and generous fluids.  She grew MSSA from her blood. She was seen by PCCM as her oxygen requirements increased substantially on 12/10 and Levaquin  was added to nafcillin to cover for presumed aspiration. PCXR showed increasing infiltrate in the LLL  Zosyn was subsequently substituted for both.   Here hospital course was complicated by acute/subacute inferior right cerebellum and vermis infarct with associated hemorrhage. 2. Additional scattered punctate foci of acute nonhemorrhagic infarct involving the right PCA territory with 1 lesion involving the right MCA territory along the right middle frontal gyrus. 3. Multiple foci of remote encephalomalacia involving the inferior posterior right temporal lobe, lateral right temporal lobe, and high right parietal lobe. 4. 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 intubation, from which she was extubated on 12/19.  On 12/23 the patient became acutely hypoxemic with respiratory distress necessitating transfer to the ICU. ABGs on NRM were PO2 46.5, PCO2 22.7 and pH 7.499.    SUBJECTIVE:  Acute respiratory distress  VITAL SIGNS: BP (!) 98/53   Pulse 71   Temp  (!) 97 F (36.1 C) (Axillary)   Resp (!) 26   Ht 5\' 4"  (1.626 m)   Wt 140 lb 3.4 oz (63.6 kg)   SpO2 100%   BMI 24.07 kg/m   HEMODYNAMICS:    VENTILATOR SETTINGS: Vent Mode: CPAP;PSV FiO2 (%):  [40 %] 40 % Set Rate:  [16 bmp] 16 bmp Vt Set:  [500 mL] 500 mL PEEP:  [5 cmH20] 5 cmH20 Pressure Support:  [10 cmH20] 10 cmH20 Plateau Pressure:  [14 cmH20-18 cmH20] 15 cmH20  INTAKE / OUTPUT: I/O last 3 completed shifts: In: 5851.7 [I.V.:2451.7; NG/GT:2300; IV Piggyback:1100] Out: 2405 [Urine:1105; Stool:1300]  PHYSICAL EXAMINATION: 61 year old white female currently sedated on Precedex infusion.  No acute distress. HEENT orally intubated, no jugular venous distention, feeding tube in right nare. Pulmonary: Scattered rhonchi, equal chest rise, no accessory use. Cardiac: Regular rate and rhythm Abdomen: Soft nontender Extremity: No significant edema brisk cap refill Neuro: Opens eyes, left-sided hemiparesis and neglect LABS:  BMET Recent Labs  Lab 10/15/17 1414 10/16/17 0331 10/17/17 0337  NA 143 143 139  K 3.9 3.3* 4.0  CL 118* 116* 115*  CO2 17* 17* 15*  BUN 19 30* 22*  CREATININE 0.78 1.00 0.78  GLUCOSE 207* 169* 206*    Electrolytes Recent Labs  Lab 10/11/17 0240  10/15/17 1414 10/16/17 0331 10/17/17 0337  CALCIUM 8.5*   < > 8.3* 7.8* 8.0*  MG 2.0  --  2.5* 2.3 2.2  PHOS 2.1*  --   --   --   --    < > = values in this interval not displayed.    CBC Recent Labs  Lab 10/15/17 0155 10/16/17  0331 10/17/17 0337  WBC 7.3 6.1 6.6  HGB 8.2* 7.7* 7.6*  HCT 25.7* 25.3* 24.3*  PLT 534* 499* 371    Coag's No results for input(s): APTT, INR in the last 168 hours.  Sepsis Markers No results for input(s): LATICACIDVEN, PROCALCITON, O2SATVEN in the last 168 hours.  ABG Recent Labs  Lab 10/15/17 1515 10/15/17 1754 10/16/17 0412  PHART 7.434 7.441 7.414  PCO2ART 24.5* 25.7* 28.1*  PO2ART 89.0 58.0* 144*    Liver Enzymes No results for input(s):  AST, ALT, ALKPHOS, BILITOT, ALBUMIN in the last 168 hours.  Cardiac Enzymes No results for input(s): TROPONINI, PROBNP in the last 168 hours.  Glucose Recent Labs  Lab 10/16/17 1522 10/16/17 1753 10/16/17 1957 10/16/17 2343 10/17/17 0347 10/17/17 0747  GLUCAP 94 175* 178* 155* 193* 191*    Imaging No results found.   STUDIES:  PCXR - residual infiltrate vs atelectasis LLL  CULTURES: Respiratory culture 12/23>>> Blood cultures 12/23>>> Blood cultures 12/18: Negative ANTIBIOTICS: Nafcillin 12/18>>>  LINES/TUBES: Endotracheal tube placed 12/23 With feeding tube placed 12/23  DISCUSSION: 61 y.o. F with CVA, recent HAP with new hypoxemic respiratory failure on 12/23 requiring intubation.  Suspect this was due to inability/ineffective airway protection.  Oxygenating well chest x-ray with left basilar atelectasis but no evidence of acute infection.  Unfortunately she has had large strokes with ineffective airway protection and left-sided neglect.  I suspect the only way we can successfully get her off the ventilator would be with a trach.  However her prognosis for functional recovery is poor at best.  ASSESSMENT / PLAN:  PULMONARY A: Hypoxemic respiratory failure suspect due to mucous plugging +/- aspiration also consider PE Portable chest x-ray reviewed demonstrates satisfactory endotracheal tube positioning.  Left basilar atelectasis/consolidation -Do not think this is thromboembolic disease she has been on anticoagulation.  Now oxygenating well -She has had difficulty maintaining her airway as a consequence of her stroke, she will need tracheostomy for airway protection if we are going to hope for successful extubation P:   Continue full ventilator support, minute ventilation change to allow some spontaneous ventilation Follow-up chest x-ray as needed Will speak with family in regards to trach PEG versus one-way extubation Cont systemic LMWH  CARDIOVASCULAR A:   Hypotension P:  Trend telemetry Volume resuscitated, follow BP indices  RENAL A:   Non-anion gap metabolic acidosis Hyperkalemia Mild hyperchloremia P:   Replace potassium Change IV fluids to LR Follow-up a.m. chemistry Getting oral bicarb   GASTROINTESTINAL A:   Protein calorie malnutrition P:   Resume tube feeds Continue H2 blockade  HEMATOLOGIC A:   [place] anemia complicated by anemia of critical illness Acute LLE and RLE DVT 12/20 P:  Trend CBC Transfuse per protocol  INFECTIOUS A:   S/p HAP MSSA bacteremia P:   Follow-up pending respiratory cultures obtained on 12/23 Antibiotic day #11, nafcillin started on 12/18 Further antibiotic recommendations per infectious disease  ENDOCRINE A:   Modest hyperglycemia   P:   Sliding scale insulin  NEUROLOGIC A:   CVA with left-sided hemiparesis embolic versus related to high-grade stenosis (large right cerebellar stroke with petechial hemorrhagic conversion, also acute large right MCA and PCA infarct).  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  Also question about endocarditis  P:   RASS goal is -1 Eventually will need TEE to rule out endocarditis as well as evaluate for PFO Continuing subcu heparin Aspirin for antiplatelet   Had an extensive family meeting with  the husband and son granddaughter and bili her brother was on the phone presented to them with options of either withdrawal or for trach and PEG and long-term care facility this point family is meeting with palliative care as well and they are going to decide on their options either today or in the next few days. Critical care time 35 minutes   [facility] Pul[facility]Critical Care [phone]/25/2018, 8:53 AM

## 2017-10-17 NOTE — Progress Notes (Signed)
STROKE TEAM PROGRESS NOTE   SUBJECTIVE (INTERVAL HISTORY) No one is at bedside. Pt  Remains intubated . She is lightly sedated on Precedex  but is responsive and follows simple commands    OBJECTIVE Lab Results: CBC:  Recent Labs  Lab 10/15/17 0155 10/16/17 0331 10/17/17 0337  WBC 7.3 6.1 6.6  HGB 8.2* 7.7* 7.6*  HCT 25.7* 25.3* 24.3*  MCV 93.8 97.3 97.2  PLT 534* 499* 371   BMP: Recent Labs  Lab 10/11/17 0240  10/14/17 0514 10/15/17 0155 10/15/17 1414 10/16/17 0331 10/17/17 0337  NA 140   < > 140 141 143 143 139  K 3.9   < > 3.3* 3.4* 3.9 3.3* 4.0  CL 116*   < > 113* 115* 118* 116* 115*  CO2 13*   < > 17* 18* 17* 17* 15*  GLUCOSE 167*   < > 182* 185* 207* 169* 206*  BUN 22*   < > 20 19 19  30* 22*  CREATININE 1.06*   < > 0.78 0.78 0.78 1.00 0.78  CALCIUM 8.5*   < > 8.2* 8.1* 8.3* 7.8* 8.0*  MG 2.0  --   --   --  2.5* 2.3 2.2  PHOS 2.1*  --   --   --   --   --   --    < > = values in this interval not displayed.   Liver Function Tests:  No results for input(s): AST, ALT, ALKPHOS, BILITOT, PROT, ALBUMIN in the last 168 hours. No results for input(s): AMMONIA in the last 168 hours. Cardiac Enzymes:  No results for input(s): CKTOTAL, CKMB, CKMBINDEX, TROPONINI in the last 168 hours. Coagulation Studies:  No results for input(s): APTT, INR in the last 72 hours. PHYSICAL EXAM Temp:  [97 F (36.1 C)-99.5 F (37.5 C)] 97 F (36.1 C) (12/25 0800) Pulse Rate:  [63-83] 83 (12/25 1140) Resp:  [16-32] 32 (12/25 1140) BP: (87-112)/(49-72) 95/63 (12/25 1140) SpO2:  [99 %-100 %] 100 % (12/25 1140) FiO2 (%):  [40 %] 40 % (12/25 1140) Weight:  [140 lb 3.4 oz (63.6 kg)] 140 lb 3.4 oz (63.6 kg) (12/25 0421)   Vitals:   10/17/17 0900 10/17/17 1000 10/17/17 1100 10/17/17 1140  BP: (!) 104/58 (!) 105/59 95/63 95/63   Pulse: 69 73 75 83  Resp: 18 (!) 27 (!) 26 (!) 32  Temp:      TempSrc:      SpO2: 100% 100% 100% 100%  Weight:      Height:         General - middle-age  Caucasian lady, not in acute distress  Ophthalmologic - Fundi not visualized due to noncooperation.  Cardiovascular - Regular rate and rhythm.  Neuro -patient is intubated and sedated awake alert, eyes spontaneously open, follows only simple commands.  PERRL, not blinking to visual threat on the left, eyes right gaze preference but able to cross midline, left facial droop. Did   follow commands with tongue protrusion. RUE and RLE spontaneous movement against gravity movement, LUE mild extension and LLE 3-/5 withdraw on pain. DTR 1+ and positive babinski bilaterally. Sensation, coordination not cooperative and gait not tested.   IMAGING  I have personally reviewed the radiological images below and agree with the radiology interpretations.  Ct Head Wo Contrast 10/01/2017 IMPRESSION:  No significant change since yesterday. Acute infarction affecting the right cerebellum with swelling. Mass-effect upon the fourth ventricle but no change in size the lateral or third ventricles at this time. No  hemorrhage. 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     MRI/MRA Brain Wo Contrast 10/01/2017 IMPRESSION:  1. Acute/subacute inferior right cerebellum and vermis infarct with associated hemorrhage.  2. Additional scattered punctate foci of acute nonhemorrhagic infarct involving the right PCA territory with 1 lesion involving the right MCA territory along the right middle frontal gyrus.  3. Multiple foci of remote encephalomalacia involving the inferior posterior right temporal lobe, lateral right temporal lobe, and high right parietal lobe.  4. 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 The right A1 and likely M1 segments fill via a patent anterior communicating artery.  5. Fenestration of the vertebrobasilar junction without significant basilar artery stenosis.  6. Marked attenuation of PCA branch vessels, worse on the right. Moderate proximal  right PCA stenosis. 7. Fetal type left posterior cerebral artery.    Echocardiogram:  - Left ventricle: The cavity size was normal. Wall thickness was   increased in a pattern of mild LVH. Systolic function was normal.   The estimated ejection fraction was in the range of 60% to 65%.   Wall motion was normal; there were no regional wall motion   abnormalities. Doppler parameters are consistent with abnormal   left ventricular relaxation (grade 1 diastolic dysfunction). - Mitral valve: There was mild regurgitation.  B/L Carotid U/S:                                                Right ECA and ICA waveforms are very similar. Unable to interrogate stenosis.  40-59% left ICA stenosis. Bilateral vertebral artery flow is antegrade.   CT Head: 10/02/17                                                    1. No significant change in the appearance of acute to subacute, hemorrhagic right inferior cerebellar infarct with slight positive mass effect on the adjacent fourth ventricle. No dilatation the third or lateral ventricles. 2. Chronic right parietal, posteromedial temporal and occipital lobe infarcts as well as right periventricular white matter tract lacunar infarct.    EEG 10/02/17:   This normal EEG is recorded in the waking state      Ct Head Wo Contrast 10/06/2017 IMPRESSION:  1. New, large area of cytotoxic edema within the posterior right hemisphere, involving the posterior parietal and temporal lobes and the occipital lobe. This includes portions of the posterior right MCA territory and the PCA territory.  2. Unchanged cytotoxic edema of the right cerebellar hemisphere with mass effect on the fourth ventricle.  3. No herniation, hydrocephalus or hemorrhage.    Ct Angio Head and neck W Or Wo Contrast 10/09/2017 IMPRESSION:  1. Occluded right cavernous ICA and occluded or severely stenotic right M1 segment. Hypertrophic lenticulostriate on the right with prompt filling of right  MCA branches, a chronic appearance.   The right cervical ICA is diffusely diminutive, presumably secondary.  2. Right P2 occlusion.  3. Moderate to advanced left paraclinoid ICA segment narrowing.  4. Poor visualization of the right V1 segment due to artifact and/or atheromatous narrowing.  5. Known acute infarcts in the right posterior cerebrum and right cerebellum. No hydrocephalus, acute hemorrhage, or  visible progression.   LE venous doppler -  Right leg, negative for deep and superficial vein thrombosis.  Left leg, positive for acute deep vein thrombosis involving the peroneal veins   UE venous doppler -  Acute deep vein thrombosis in the distal right subclavian, axillary, proximal brachial veins. Superficial vein thrombosis throughout the right cephalic and basilic veins.   Somewhat difficult exam due to patient position and cooperation                ASSESSMENT: Ms. RONNY RUDDELL is a 61 y.o. female with PMH of prior hypertension, coronary artery disease, hypothyroidism, chronic pain, depression, smoker and peripheral arterial disease status post aorto-iliac bypass in June admitted for fever, confusion, right flank pain. Found to have right pyelonephritis and hydronephrosis. Also MRI showed right cerebellar large infarcts with right MCA scattered infarcts. B Cx showed MSSA. On Abx. However, repeat CT head on 10/06/17 showed new large right MCA infarcts. Concerning for endocarditis.    Acute, large Right cerebellar stroke with petechial hemorrhagic conversion as well as acute large right MCA and PCA infarct - concerning for endocarditis, but also found to have High-grade stenosis or occlusion of the cavernous right internal carotid artery.    Resultant - intubated on sedation, left hemiparesis -> extubated 10/11/2017 but reintubated 10/15/17  CT head 10/02/17 right cerebellar large infarct  MRI head 10/01/17 right cerebellar large infarct and right MCA scattered small infarcts. Old  right PCA, right caudate, right parietal high convexity  MRA head - right ICA cavernous high grade stenosis vs. Occlusion, b/l M1 stenosis, right PCA distal stenosis  CT head 10/06/17 - new large right MCA and PCA infarct with cytotoxic edema  CTA head and neck - right ICA occlusion, right M1 high grade stenosis vs. Occlusion, right P2 occlusion and right V1 high grade stenosis, no mycotic aneurysms  CUS - waveform consistent with right ICA distal stenosis vs. Occlusion  EEG - 10/02/2017 - no abnormalities  TTE EF 60-65%, no vegetation  Consider TEE to rule out endocarditis and also evaluate PFO -cardiology initially turned down request for TEE will shoot for next Monday  Heparin subq for DVT prophylaxis  NPO for now  No antithrombotics PTA, now on ASA 325mg .  Therapy recommendation - CIR recommended  Deposition - pending  Sepsis with pyelonephritis and bacteremia - ? Endocarditis  Intermittent high grade fever with low BP  Admitted for pyelonephritis with hydronephrosis confirmed on CT  B Cx positive for MSSA 10/03/17, repeat culture neg  Urine culture neg  Sputum culture neg  On nafcillin  Due to recurrent stroke, concerning for endocarditis   Consider TEE - shoot for next Monday  Dr Elease Hashimoto saw pt 10/19/2017 for possible TEE  Hematuria   Continued hematuria  H&H stable - Hb 8.4->9.6  Continue monitoring  UA 10/06/17 - RBC TNTC, WBC 6-30  CCM on board  Right LE and UE acute DVT  Venous doppler confirmed RLE and RUE DVT -> full dose lovenox  Discussed with Dr. Odessa Fleming, was on heparin IV but agree to change to lovenox due to IV access  Consider DOACs once medically stable and no planned procedure  Will need TEE or TCD bubble study later to evaluate PFO  AKI, resolved  Cre 1.64->2.01->1.36->1.73->1.23->1.05->1.13->1.06->0.81->0.78  On IVF and TF  CCM on board   Continue to monitor  Intracranial atherosclerosis and stenosis:  MRA showed  right ICA distal occlusion, b/l M1 stenosis, right PCA distal stenosis  CTA head and neck -  right ICA occlusion, right M1 high grade stenosis vs. Occlusion, right P2 occlusion and right V1 high grade stenosis,  CUS consistent with right ICA distal stenosis or occlusion  On ASA now  May consider DAPT on discharge if pt hematuria improves  Will consider cerebral angiogram to further assess right ICA and MCA occlusion/stenosis once more stabilized  Avoid hypotension  HTN / hypotension  Stable now  D/c'ed amlodipine  Resume TF and continue IVF  BP goal 130-150 due to right ICA occlusion  Avoid hypotension  HYPERLIPIDEMIA  Home Meds:  Pravachol 20 mg  LDL 46,  goal < 70  AST 183->74  ALT 72->22  Resumed pravachol 20  TOBACCO ABUSE  Current smoker  Smoking cessation counseling when applicable  Nicotine patch PRN  Other Active Problems:  Hypernatremia - resolved  Hyperglycemia - improved  Hypoxia -  Seen by CCM 10/13/2017 - pt to remain in unit for now (extubated 10/11/2017)  May not be ready for TEE Monday. Dr Elease Hashimoto saw pt 10/10/17 - note deleted by Dr [facility] day # 17  I have personally examined this patient, reviewed notes, independently viewed imaging studies, participated in medical decision making and plan of care.ROS completed by me personally and pertinent positives fully documented  I have made any additions or clarifications directly to the above note.   No family available at the bedside for discussion. Discuss with Dr. Ninfa Linden. Recommend discussion with family about 1 day extubation versus tracheostomy and enteric tube. This patient is critically ill and at significant risk of neurological worsening, death and care requires constant monitoring of vital signs, hemodynamics,respiratory and cardiac monitoring, extensive review of multiple databases, frequent neurological assessment, discussion with family, other specialists and medical decision  making of high complexity.I have made any additions or clarifications directly to the above note.This critical care time does not reflect procedure time, or teaching time or supervisory time of PA/NP/Med Resident etc but could involve care discussion time.  I spent 30 minutes of neurocritical care time  in the care of  this patient.     Delia Heady, MD Medical Director [facility] Pager: [phone] 10/17/2017 12:12 PM     To contact Stroke Continuity provider, please refer to [email or web address]. After hours, contact General Neurology

## 2017-10-18 ENCOUNTER — Inpatient Hospital Stay (HOSPITAL_COMMUNITY): Payer: BLUE CROSS/BLUE SHIELD

## 2017-10-18 DIAGNOSIS — J9601 Acute respiratory failure with hypoxia: Secondary | ICD-10-CM

## 2017-10-18 DIAGNOSIS — Z9911 Dependence on respirator [ventilator] status: Secondary | ICD-10-CM

## 2017-10-18 LAB — CBC WITH DIFFERENTIAL/PLATELET
BASOS PCT: 0 %
Basophils Absolute: 0 10*3/uL (ref 0.0–0.1)
Eosinophils Absolute: 0.2 10*3/uL (ref 0.0–0.7)
Eosinophils Relative: 2 %
HEMATOCRIT: 21 % — AB (ref 36.0–46.0)
Hemoglobin: 6.7 g/dL — CL (ref 12.0–15.0)
LYMPHS ABS: 1.9 10*3/uL (ref 0.7–4.0)
Lymphocytes Relative: 31 %
MCH: 30.9 pg (ref 26.0–34.0)
MCHC: 31.9 g/dL (ref 30.0–36.0)
MCV: 96.8 fL (ref 78.0–100.0)
MONO ABS: 0.4 10*3/uL (ref 0.1–1.0)
MONOS PCT: 6 %
NEUTROS ABS: 3.7 10*3/uL (ref 1.7–7.7)
Neutrophils Relative %: 61 %
Platelets: 387 10*3/uL (ref 150–400)
RBC: 2.17 MIL/uL — ABNORMAL LOW (ref 3.87–5.11)
RDW: 20.7 % — AB (ref 11.5–15.5)
WBC: 6.2 10*3/uL (ref 4.0–10.5)

## 2017-10-18 LAB — CBC
HEMATOCRIT: 27.7 % — AB (ref 36.0–46.0)
HEMOGLOBIN: 9 g/dL — AB (ref 12.0–15.0)
MCH: 30.7 pg (ref 26.0–34.0)
MCHC: 32.5 g/dL (ref 30.0–36.0)
MCV: 94.5 fL (ref 78.0–100.0)
Platelets: 363 10*3/uL (ref 150–400)
RBC: 2.93 MIL/uL — ABNORMAL LOW (ref 3.87–5.11)
RDW: 19.5 % — AB (ref 11.5–15.5)
WBC: 7.1 10*3/uL (ref 4.0–10.5)

## 2017-10-18 LAB — COMPREHENSIVE METABOLIC PANEL
ALBUMIN: 1.9 g/dL — AB (ref 3.5–5.0)
ALK PHOS: 78 U/L (ref 38–126)
ALT: 25 U/L (ref 14–54)
ANION GAP: 8 (ref 5–15)
AST: 29 U/L (ref 15–41)
BILIRUBIN TOTAL: 1 mg/dL (ref 0.3–1.2)
BUN: 17 mg/dL (ref 6–20)
CALCIUM: 8 mg/dL — AB (ref 8.9–10.3)
CO2: 17 mmol/L — ABNORMAL LOW (ref 22–32)
Chloride: 114 mmol/L — ABNORMAL HIGH (ref 101–111)
Creatinine, Ser: 0.75 mg/dL (ref 0.44–1.00)
Glucose, Bld: 183 mg/dL — ABNORMAL HIGH (ref 65–99)
POTASSIUM: 3.3 mmol/L — AB (ref 3.5–5.1)
Sodium: 139 mmol/L (ref 135–145)
TOTAL PROTEIN: 6.2 g/dL — AB (ref 6.5–8.1)

## 2017-10-18 LAB — PROTIME-INR
INR: 1.22
PROTHROMBIN TIME: 15.3 s — AB (ref 11.4–15.2)

## 2017-10-18 LAB — BLOOD GAS, ARTERIAL
Acid-base deficit: 5.6 mmol/L — ABNORMAL HIGH (ref 0.0–2.0)
BICARBONATE: 17.3 mmol/L — AB (ref 20.0–28.0)
Drawn by: 51133
FIO2: 0.3
LHR: 16 {breaths}/min
O2 SAT: 97.4 %
PATIENT TEMPERATURE: 98.1
PCO2 ART: 23.3 mmHg — AB (ref 32.0–48.0)
PEEP: 5 cmH2O
VT: 425 mL
pH, Arterial: 7.483 — ABNORMAL HIGH (ref 7.350–7.450)
pO2, Arterial: 88.7 mmHg (ref 83.0–108.0)

## 2017-10-18 LAB — GLUCOSE, CAPILLARY
Glucose-Capillary: 164 mg/dL — ABNORMAL HIGH (ref 65–99)
Glucose-Capillary: 169 mg/dL — ABNORMAL HIGH (ref 65–99)
Glucose-Capillary: 145 mg/dL — ABNORMAL HIGH (ref 65–99)
Glucose-Capillary: 153 mg/dL — ABNORMAL HIGH (ref 65–99)
Glucose-Capillary: 158 mg/dL — ABNORMAL HIGH (ref 65–99)
Glucose-Capillary: 169 mg/dL — ABNORMAL HIGH (ref 65–99)

## 2017-10-18 LAB — APTT: aPTT: 40 seconds — ABNORMAL HIGH (ref 24–36)

## 2017-10-18 LAB — MAGNESIUM: Magnesium: 2 mg/dL (ref 1.7–2.4)

## 2017-10-18 LAB — CULTURE, RESPIRATORY W GRAM STAIN

## 2017-10-18 LAB — CULTURE, RESPIRATORY: CULTURE: NO GROWTH

## 2017-10-18 LAB — PREPARE RBC (CROSSMATCH)

## 2017-10-18 MED ORDER — SODIUM CHLORIDE 0.9 % IV SOLN: Freq: Once | INTRAVENOUS | Status: AC

## 2017-10-18 MED ORDER — POTASSIUM CHLORIDE 20 MEQ/15ML (10%) PO SOLN
40.0000 meq | ORAL | Status: AC
  Administered 2017-10-18 (×2): 40 meq
  Filled 2017-10-18 (×2): qty 30

## 2017-10-18 NOTE — Progress Notes (Signed)
Patient ID: Ashlee Snow, female   DOB: July 24, 1956, 61 y.o.   MRN: [id number]  This NP visited patient at the bedside as a follow up to  yesterday's GOCs meeting.  No family present.  Placed call to husband for emotional support and continued conversation regarding diagnosis, prognosis, GOC and anticipatory care needs.  He speaks to his complicated family dynamics and how difficult this decision is for him and his family.  He "knows what he has to do" but is trying to "take care of things at home".  He is unable to meet me today before 5pm but plans to come to the hospital this evening.  I encouraged to to ask to speak with the medical providers when he arrives.  He agrees to meet me tomorrow before five and will call me when he arrives at the hosptial   Discussed with patient the importance of continued conversation with his  family and their  medical providers regarding overall plan of care and treatment options,  ensuring decisions are within the context of the patients values and GOCs.  Questions and concerns addressed  Total time spent on the unit was 15 minutes    Time in 1200  Time out 1215  Discussed with bedside RN  Lorinda CreedMary Jovonne Wilton NP  Palliative Medicine Team Team Phone # [phone] Pager [phone]

## 2017-10-18 NOTE — Progress Notes (Signed)
eLink Physician-Brief Progress Note Patient Name: Ashlee ParaDebra A Snow DOB: 04/16/1956 MRN: [id number]   Date of Service  10/18/2017  HPI/Events of Note  hypokalemia  eICU Interventions  Potassium replaced     Intervention Category Intermediate Interventions: Electrolyte abnormality - evaluation and management  DETERDING,ELIZABETH 10/18/2017, 5:07 AM

## 2017-10-18 NOTE — Progress Notes (Signed)
Physical Therapy Treatment Patient Details Name: Ashlee Snow A Belli MRN: [id number] DOB: 08/22/1956 Today's Date: 10/18/2017    History of Present Illness This is a 61 year old with a past medical history of fibromyalgia, HTN, and hypothyroidism . 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 Additional scattered punctate foci of acute nonhemorrhagic infarct involving the right PCA territory with 1 lesion involving the right MCA territory along the right middle frontal gyrus. Multiple foci of remote encephalomalacia involving the inferior posterior right temporal lobe, lateral right temporal lobe, and high right parietal lobe. Pt intubated 12/14 and extubated 12/19. On 12/20 found to have LLE + for acute DVT in the peroneal veins; RUE acute DVT in the distal right subclavian, axillary, proximal brachial veins.  Superficial vein thrombosis throughout the right cephalic and basilic veins.    PT Comments    Goals re-assessed this session.  Pt was able to tolerate EOB with mod to max assist for transitions and sitting balance. She tolerated session well on CPAP/Pressure support mode on the vent.  It would be beneficial to attempt standing again next session.     Follow Up Recommendations  SNF;Supervision/Assistance - 24 hour     Equipment Recommendations  Wheelchair cushion (measurements PT);Wheelchair (measurements PT);Hospital bed    Recommendations for Other Services   NA     Precautions / Restrictions Precautions Precautions: Fall Precaution Comments: Cortrak, ETT, vent, flexiseal, foley, LLE DVT, RUE DVT    Mobility  Bed Mobility Overal bed mobility: Needs Assistance Bed Mobility: Supine to Sit;Sit to Supine Rolling: +2 for physical assistance   Supine to sit: +2 for physical assistance;Max assist Sit to supine: Mod assist   General bed mobility  comments: Called RN in to assist with getting pt seated EOB.  Pt pushing with her right arm to the left during transition making it difficult to get her to sitting without L LOB.    Transfers                 General transfer comment: Did not attempt with only one person assist.       Modified Rankin (Stroke Patients Only) Modified Rankin (Stroke Patients Only) Pre-Morbid Rankin Score: No significant disability Modified Rankin: Severe disability     Balance Overall balance assessment: Needs assistance Sitting-balance support: Feet supported;Single extremity supported Sitting balance-Leahy Scale: Zero Sitting balance - Comments: Pt needed max assist initially to sustain balance EOB.  Once right hand placed in lap pt required mod assist to maintain sitting balance.  Pt sat EOB for ~10 mins working on sitting balance and basic command following.                                      Cognition Arousal/Alertness: Awake/alert Behavior During Therapy: Restless Overall Cognitive Status: Difficult to assess Area of Impairment: Attention;Following commands;Safety/judgement;Awareness;Problem solving                   Current Attention Level: Sustained   Following Commands: Follows one step commands inconsistently;Follows one step commands with increased time Safety/Judgement: Decreased awareness of safety;Decreased awareness of deficits Awareness: Intellectual Problem Solving: Decreased initiation;Slow processing;Requires verbal cues;Difficulty sequencing;Requires tactile cues General Comments: Pt able to follow a few basic commands with structured environment, increased processing time and multi modal cues  General Comments General comments (skin integrity, edema, etc.): Pt tolerated treatement well with VSS on CPAP/Pressure support setting on the vent.       Pertinent Vitals/Pain Pain Assessment: Faces Faces Pain Scale: No hurt       Prior  Function            PT Goals (current goals can now be found in the care plan section) Acute Rehab PT Goals Patient Stated Goal: Unable to state due to ETT PT Goal Formulation: Patient unable to participate in goal setting Time For Goal Achievement: 11/01/17 Potential to Achieve Goals: Good Progress towards PT goals: Progressing toward goals(goals reviewed and updated)    Frequency    Min 3X/week      PT Plan Current plan remains appropriate       AM-PAC PT "6 Clicks" Daily Activity  Outcome Measure  Difficulty turning over in bed (including adjusting bedclothes, sheets and blankets)?: Unable Difficulty moving from lying on back to sitting on the side of the bed? : Unable Difficulty sitting down on and standing up from a chair with arms (e.g., wheelchair, bedside commode, etc,.)?: Unable Help needed moving to and from a bed to chair (including a wheelchair)?: Total Help needed walking in hospital room?: Total Help needed climbing 3-5 steps with a railing? : Total 6 Click Score: 6    End of Session   Activity Tolerance: Patient limited by fatigue Patient left: in bed;with call bell/phone within reach;with bed alarm set   PT Visit Diagnosis: Unsteadiness on feet (R26.81);Other symptoms and signs involving the nervous system (R29.898);Other abnormalities of gait and mobility (R26.89);Muscle weakness (generalized) (M62.81)     Time: [id number] PT Time Calculation (min) (ACUTE ONLY): 21 min  Charges:     1 re -eval        Jozalynn Noyce B. Allexus Ovens, PT, DPT [phone]            10/18/2017, 10:14 PM

## 2017-10-18 NOTE — Progress Notes (Signed)
STROKE TEAM PROGRESS NOTE   SUBJECTIVE (INTERVAL HISTORY) No one is at bedside. Pt  Remains intubated . She is lightly sedated on Precedex  but is responsive and follows simple commands Await family decision on trach/PEG versus palliative care   OBJECTIVE Lab Results: CBC:  Recent Labs  Lab 10/17/17 0337 10/18/17 0256 10/18/17 1210  WBC 6.6 6.2 7.1  HGB 7.6* 6.7* 9.0*  HCT 24.3* 21.0* 27.7*  MCV 97.2 96.8 94.5  PLT 371 387 363   BMP: Recent Labs  Lab 10/15/17 0155 10/15/17 1414 10/16/17 0331 10/17/17 0337 10/18/17 0256  NA 141 143 143 139 139  K 3.4* 3.9 3.3* 4.0 3.3*  CL 115* 118* 116* 115* 114*  CO2 18* 17* 17* 15* 17*  GLUCOSE 185* 207* 169* 206* 183*  BUN 19 19 30* 22* 17  CREATININE 0.78 0.78 1.00 0.78 0.75  CALCIUM 8.1* 8.3* 7.8* 8.0* 8.0*  MG  --  2.5* 2.3 2.2 2.0   Liver Function Tests:  Recent Labs  Lab 10/18/17 0256  AST 29  ALT 25  ALKPHOS 78  BILITOT 1.0  PROT 6.2*  ALBUMIN 1.9*   No results for input(s): AMMONIA in the last 168 hours. Cardiac Enzymes:  No results for input(s): CKTOTAL, CKMB, CKMBINDEX, TROPONINI in the last 168 hours. Coagulation Studies:  Recent Labs    10/18/17 0256  APTT 40*  INR 1.22   PHYSICAL EXAM Temp:  [98.1 F (36.7 C)-99.2 F (37.3 C)] 98.4 F (36.9 C) (12/26 1200) Pulse Rate:  [62-85] 71 (12/26 1544) Resp:  [18-29] 22 (12/26 1544) BP: (91-138)/(52-87) 108/67 (12/26 1544) SpO2:  [98 %-100 %] 100 % (12/26 1544) FiO2 (%):  [30 %-40 %] 30 % (12/26 1544) Weight:  [147 lb 4.3 oz (66.8 kg)] 147 lb 4.3 oz (66.8 kg) (12/26 0500)   Vitals:   10/18/17 1115 10/18/17 1200 10/18/17 1300 10/18/17 1544  BP:  101/65 118/63 108/67  Pulse: 77 67 71 71  Resp: (!) 27 19 18  (!) 22  Temp:  98.4 F (36.9 C)    TempSrc:  Axillary    SpO2: 100% 100% 100% 100%  Weight:      Height:         General - middle-age Caucasian lady, not in acute distress  Ophthalmologic - Fundi not visualized due to  noncooperation.  Cardiovascular - Regular rate and rhythm.  Neuro -patient is intubated and sedated awake alert, eyes spontaneously open, follows only simple commands.  PERRL, not blinking to visual threat on the left, eyes right gaze preference but able to cross midline, left facial droop. Did   follow commands with tongue protrusion. RUE and RLE spontaneous movement against gravity movement, LUE mild extension and LLE 3-/5 withdraw on pain. DTR 1+ and positive babinski bilaterally. Sensation, coordination not cooperative and gait not tested.   IMAGING  I have personally reviewed the radiological images below and agree with the radiology interpretations.  Ct Head Wo Contrast 10/01/2017 IMPRESSION:  No significant change since yesterday. Acute infarction affecting the right cerebellum with swelling. Mass-effect upon the fourth ventricle but no change in size the lateral or third ventricles at this time. No hemorrhage. 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     MRI/MRA Brain Wo Contrast 10/01/2017 IMPRESSION:  1. Acute/subacute inferior right cerebellum and vermis infarct with associated hemorrhage.  2. Additional scattered punctate foci of acute nonhemorrhagic infarct involving the right PCA territory with 1 lesion involving the right MCA territory along the right middle frontal  gyrus.  3. Multiple foci of remote encephalomalacia involving the inferior posterior right temporal lobe, lateral right temporal lobe, and high right parietal lobe.  4. 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 The right A1 and likely M1 segments fill via a patent anterior communicating artery.  5. Fenestration of the vertebrobasilar junction without significant basilar artery stenosis.  6. Marked attenuation of PCA branch vessels, worse on the right. Moderate proximal right PCA stenosis. 7. Fetal type left posterior cerebral artery.    Echocardiogram:   - Left ventricle: The cavity size was normal. Wall thickness was   increased in a pattern of mild LVH. Systolic function was normal.   The estimated ejection fraction was in the range of 60% to 65%.   Wall motion was normal; there were no regional wall motion   abnormalities. Doppler parameters are consistent with abnormal   left ventricular relaxation (grade 1 diastolic dysfunction). - Mitral valve: There was mild regurgitation.  B/L Carotid U/S:                                                Right ECA and ICA waveforms are very similar. Unable to interrogate stenosis.  40-59% left ICA stenosis. Bilateral vertebral artery flow is antegrade.   CT Head: 10/02/17                                                    1. No significant change in the appearance of acute to subacute, hemorrhagic right inferior cerebellar infarct with slight positive mass effect on the adjacent fourth ventricle. No dilatation the third or lateral ventricles. 2. Chronic right parietal, posteromedial temporal and occipital lobe infarcts as well as right periventricular white matter tract lacunar infarct.    EEG 10/02/17:   This normal EEG is recorded in the waking state      Ct Head Wo Contrast 10/06/2017 IMPRESSION:  1. New, large area of cytotoxic edema within the posterior right hemisphere, involving the posterior parietal and temporal lobes and the occipital lobe. This includes portions of the posterior right MCA territory and the PCA territory.  2. Unchanged cytotoxic edema of the right cerebellar hemisphere with mass effect on the fourth ventricle.  3. No herniation, hydrocephalus or hemorrhage.    Ct Angio Head and neck W Or Wo Contrast 10/09/2017 IMPRESSION:  1. Occluded right cavernous ICA and occluded or severely stenotic right M1 segment. Hypertrophic lenticulostriate on the right with prompt filling of right MCA branches, a chronic appearance.   The right cervical ICA is diffusely diminutive,  presumably secondary.  2. Right P2 occlusion.  3. Moderate to advanced left paraclinoid ICA segment narrowing.  4. Poor visualization of the right V1 segment due to artifact and/or atheromatous narrowing.  5. Known acute infarcts in the right posterior cerebrum and right cerebellum. No hydrocephalus, acute hemorrhage, or visible progression.   LE venous doppler -  Right leg, negative for deep and superficial vein thrombosis.  Left leg, positive for acute deep vein thrombosis involving the peroneal veins   UE venous doppler -  Acute deep vein thrombosis in the distal right subclavian, axillary, proximal brachial veins. Superficial vein thrombosis throughout the right cephalic  and basilic veins.   Somewhat difficult exam due to patient position and cooperation                ASSESSMENT: Ashlee Snow is a 61 y.o. female with PMH of prior hypertension, coronary artery disease, hypothyroidism, chronic pain, depression, smoker and peripheral arterial disease status post aorto-iliac bypass in June admitted for fever, confusion, right flank pain. Found to have right pyelonephritis and hydronephrosis. Also MRI showed right cerebellar large infarcts with right MCA scattered infarcts. B Cx showed MSSA. On Abx. However, repeat CT head on 10/06/17 showed new large right MCA infarcts. Concerning for endocarditis.    Acute, large Right cerebellar stroke with petechial hemorrhagic conversion as well as acute large right MCA and PCA infarct - concerning for endocarditis, but also found to have High-grade stenosis or occlusion of the cavernous right internal carotid artery.    Resultant - intubated on sedation, left hemiparesis -> extubated 10/11/2017 but reintubated 10/15/17  CT head 10/02/17 right cerebellar large infarct  MRI head 10/01/17 right cerebellar large infarct and right MCA scattered small infarcts. Old right PCA, right caudate, right parietal high convexity  MRA head - right ICA  cavernous high grade stenosis vs. Occlusion, b/l M1 stenosis, right PCA distal stenosis  CT head 10/06/17 - new large right MCA and PCA infarct with cytotoxic edema  CTA head and neck - right ICA occlusion, right M1 high grade stenosis vs. Occlusion, right P2 occlusion and right V1 high grade stenosis, no mycotic aneurysms  CUS - waveform consistent with right ICA distal stenosis vs. Occlusion  EEG - 10/02/2017 - no abnormalities  TTE EF 60-65%, no vegetation  Consider TEE to rule out endocarditis and also evaluate PFO -cardiology initially turned down request for TEE will shoot for next Monday  Heparin subq for DVT prophylaxis  NPO for now  No antithrombotics PTA, now on ASA 325mg .  Therapy recommendation - CIR recommended  Deposition - pending  Sepsis with pyelonephritis and bacteremia - ? Endocarditis  Intermittent high grade fever with low BP  Admitted for pyelonephritis with hydronephrosis confirmed on CT  B Cx positive for MSSA 10/03/17, repeat culture neg  Urine culture neg  Sputum culture neg  On nafcillin  Due to recurrent stroke, concerning for endocarditis   Consider TEE - shoot for next Monday  Dr Elease Hashimoto saw pt 10/23/2017 for possible TEE  Hematuria   Continued hematuria  H&H stable - Hb 8.4->9.6  Continue monitoring  UA 10/06/17 - RBC TNTC, WBC 6-30  CCM on board  Right LE and UE acute DVT  Venous doppler confirmed RLE and RUE DVT -> full dose lovenox  Discussed with Dr. Odessa Fleming, was on heparin IV but agree to change to lovenox due to IV access  Consider DOACs once medically stable and no planned procedure  Will need TEE or TCD bubble study later to evaluate PFO  AKI, resolved  Cre 1.64->2.01->1.36->1.73->1.23->1.05->1.13->1.06->0.81->0.78  On IVF and TF  CCM on board   Continue to monitor  Intracranial atherosclerosis and stenosis:  MRA showed right ICA distal occlusion, b/l M1 stenosis, right PCA distal stenosis  CTA head  and neck - right ICA occlusion, right M1 high grade stenosis vs. Occlusion, right P2 occlusion and right V1 high grade stenosis,  CUS consistent with right ICA distal stenosis or occlusion  On ASA now  May consider DAPT on discharge if pt hematuria improves  Will consider cerebral angiogram to further assess right ICA and MCA occlusion/stenosis once  more stabilized  Avoid hypotension  HTN / hypotension  Stable now  D/c'ed amlodipine  Resume TF and continue IVF  BP goal 130-150 due to right ICA occlusion  Avoid hypotension  HYPERLIPIDEMIA  Home Meds:  Pravachol 20 mg  LDL 46,  goal < 70  AST 183->74  ALT 72->22  Resumed pravachol 20  TOBACCO ABUSE  Current smoker  Smoking cessation counseling when applicable  Nicotine patch PRN  Other Active Problems:  Hypernatremia - resolved  Hyperglycemia - improved  Hypoxia -  Seen by CCM 10/13/2017 - pt to remain in unit for now (extubated 10/11/2017)  May not be ready for TEE Monday. Dr Elease HashimotoNahser saw pt 10/06/2017 - note deleted [facility]  Hospital day # 18  I have personally examined this patient, reviewed notes, independently viewed imaging studies, participated in medical decision making and plan of care.ROS completed by me personally and pertinent positives fully documented  I have made any additions or clarifications directly to the above note.   No family available at the bedside for discussion. Discussed with Dr. Ninfa LindenAhmad.and palliative care team NP Joycelyn RuaMary Warach .Await family decision   about 1 way extubation versus tracheostomy and PEG tube. This patient is critically ill and at significant risk of neurological worsening, death and care requires constant monitoring of vital signs, hemodynamics,respiratory and cardiac monitoring, extensive review of multiple databases, frequent neurological assessment, discussion with family, other specialists and medical decision making of high complexity.I have made any additions or  clarifications directly to the above note.This critical care time does not reflect procedure time, or teaching time or supervisory time of PA/NP/Med Resident etc but could involve care discussion time.  I spent 30 minutes of neurocritical care time  in the care of  this patient.     Delia HeadyPramod Sethi, MD Medical Director [facility] Pager: [phone] 10/18/2017 3:45 PM     To contact Stroke Continuity provider, please refer to [email or web address]. After hours, contact General Neurology

## 2017-10-18 NOTE — Progress Notes (Addendum)
PULMONARY / CRITICAL CARE MEDICINE   Name: AURIAH HOLLINGS MRN: [id number] DOB: [birth year]    ADMISSION DATE:  09/24/2017 CONSULTATION DATE:  09/27/2017  CHIEF COMPLAINT:  Acute hypoxemic respiratory failure  HISTORY OF PRESENT ILLNESS:   This [name] was initially seen in consultation by the [facility] service 12/13 following her admission for fevers, confusion, N&V, and right flank pain.She has a past medical history of fibromyalgia, hypertension, and hypothyroidism who for several days prior to admission had apparently been suffering from nausea and vomiting.  She subsequently had fevers and was evaluated at an outside hospital where she was found to have left hydronephrosis.  A ureteral stent was placed and she was treated for urosepsis with antibiotics and generous fluids.  She grew MSSA from her blood. She was seen by PCCM as her oxygen requirements increased substantially on 12/10 and Levaquin  was added to nafcillin to cover for presumed aspiration. PCXR showed increasing infiltrate in the LLL  Zosyn was subsequently substituted for both.   Here hospital course was complicated by acute/subacute inferior right cerebellum and vermis infarct with associated hemorrhage. 2. Additional scattered punctate foci of acute nonhemorrhagic infarct involving the right PCA territory with 1 lesion involving the right MCA territory along the right middle frontal gyrus. 3. Multiple foci of remote encephalomalacia involving the inferior posterior right temporal lobe, lateral right temporal lobe, and high right parietal lobe. 4. 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 intubation, from which she was extubated on 12/19.  On 12/23 the patient became acutely hypoxemic with respiratory distress necessitating transfer to the ICU. ABGs on NRM were PO2 46.5, PCO2 22.7 and pH 7.499.   SUBJECTIVE:  Per RN, no acute changes other than poor peripheral access Hgb drop from 7.6  to 6.7 12/26, transfused 1 unit PRBC Remains on precedex gtt at 1 mcg/kg/min Apneic on initial SBT this am  VITAL SIGNS: BP 137/72   Pulse 85   Temp 98.9 F (37.2 C) (Axillary)   Resp (!) 24   Ht '5\' 4"'  (1.626 m)   Wt 147 lb 4.3 oz (66.8 kg)   SpO2 99%   BMI 25.28 kg/m   HEMODYNAMICS:    VENTILATOR SETTINGS: Vent Mode: PRVC FiO2 (%):  [30 %-40 %] 30 % Set Rate:  [16 bmp] 16 bmp Vt Set:  [500 mL] 500 mL PEEP:  [5 cmH20] 5 cmH20 Plateau Pressure:  [8 cmH20-20 cmH20] 15 cmH20  INTAKE / OUTPUT: I/O last 3 completed shifts: In: 6704.4 [I.V.:3239.4; Blood:30; WU/JW:1191; IV Piggyback:1000] Out: 3025 [Urine:1325; Stool:1700]  PHYSICAL EXAMINATION: General: Critically ill adult female on MV in NAD HEENT: MM pink/moist, ETT, R cortrak, pupils 4/reactive Neuro: Awakens to verbal on precedex gtt, follows simple commands, 5/5 on right, will move LLE 2/5, no movement in LUE CV: rrr, no m/r/g PULM: even/non-labored on MV, lungs bilaterally clear GI: soft, non-tender, bs active, flexiseal in place with liquid brown stool Extremities: warm/dry, no edema  Skin: no rashes, scattered bruises to bilateral arms  LABS:  BMET Recent Labs  Lab 10/16/17 0331 10/17/17 0337 10/18/17 0256  NA 143 139 139  K 3.3* 4.0 3.3*  CL 116* 115* 114*  CO2 17* 15* 17*  BUN 30* 22* 17  CREATININE 1.00 0.78 0.75  GLUCOSE 169* 206* 183*    Electrolytes Recent Labs  Lab 10/16/17 0331 10/17/17 0337 10/18/17 0256  CALCIUM 7.8* 8.0* 8.0*  MG 2.3 2.2 2.0    CBC Recent Labs  Lab 10/16/17 0331 10/17/17 0337 10/18/17 0256  WBC 6.1 6.6 6.2  HGB 7.7* 7.6* 6.7*  HCT 25.3* 24.3* 21.0*  PLT 499* 371 387    Coag's Recent Labs  Lab 10/18/17 0256  APTT 40*  INR 1.22    Sepsis Markers No results for input(s): LATICACIDVEN, PROCALCITON, O2SATVEN in the last 168 hours.  ABG Recent Labs  Lab 10/15/17 1754 10/16/17 0412 10/18/17 0430  PHART 7.441 7.414 7.483*  PCO2ART 25.7* 28.1*  23.3*  PO2ART 58.0* 144* 88.7    Liver Enzymes Recent Labs  Lab 10/18/17 0256  AST 29  ALT 25  ALKPHOS 78  BILITOT 1.0  ALBUMIN 1.9*    Cardiac Enzymes No results for input(s): TROPONINI, PROBNP in the last 168 hours.  Glucose Recent Labs  Lab 10/17/17 1124 10/17/17 1542 10/17/17 2006 10/17/17 2336 10/18/17 0348 10/18/17 0803  GLUCAP 171* 165* 185* 168* 164* 169*    Imaging Dg Chest Port 1 View  Result Date: 10/18/2017 CLINICAL DATA:  Asthma, intubated patient status post CVA. EXAM: PORTABLE CHEST 1 VIEW COMPARISON:  Chest x-ray of October 15, 2017 FINDINGS: The lungs remain mildly hyperinflated. Retrocardiac density on the left persists but has decreased. The rare it an area of rounded density persists overlying the cardiac apex. The right lung is clear. The cardiopericardial silhouette is normal. The pulmonary vascularity is not engorged. The endotracheal tube tip projects 3.4 cm above the carina. The feeding tube tip projects below the inferior margin of the image. IMPRESSION: Improving left retrocardiac atelectasis or pneumonia. Persistent rounded density overlying the cardiac apex most compatible with residual pneumonia. Electronically Signed   By: David  Martinique M.D.   On: 10/18/2017 07:36   STUDIES:  Echo 12/09 >> Left ventricle: The cavity size was normal. Wall thickness was   increased in a pattern of mild LVH. Systolic function was normal.   The estimated ejection fraction was in the range of 60% to 65%.   Wall motion was normal; there were no regional wall motion   abnormalities. Doppler parameters are consistent with abnormal   left ventricular relaxation (grade 1 diastolic dysfunction). - Mitral valve: There was mild regurgitation  Vascular US Upper 12/19 >> Right Acute deep vein thrombosis in the distal right subclavian, axillary, brachial veins. Superficial vein thrombosis throughout the right cephalic and basilic veins. Somewhat difficult study. Left  Visualized portions of the left IJV and Subclavian appear negative for deep vein Thrombosis  Vas [place] lower 12/19 >> Right: There is no evidence of deep vein thrombosis in the lower extremity.There is no evidence of superficial venous thrombosis. Left: There is evidence of acute DVT in the Peroneal vein. There is no evidence of superficial venous thrombosis.  CULTURES: Respiratory culture 12/23 >> Blood cultures x2 12/23 >> Urine culture 12/23 >> Blood cultures 12/18: Negative Blood cultures 12/11: negative Urine culture 12/14: negative Stool C-diff 12/08: negative Blood cultures 12/08:  MSSA  ANTIBIOTICS: Nafcillin 12/18>>> Zosyn  12/12  >> 12/18 Vancomycin 12/08  >> 12/14  LINES/TUBES: ETT 12/14 to 12/19; 12/23 >> Left cortrak feeding tube 12/23 >> PIV x 2 Foley Flexiseal   DISCUSSION: 61 y.o. F with CVA, recent HAP with new hypoxemic respiratory failure on 12/23 requiring re-intubation.  Suspect this was due to inability/ineffective airway protection.  Oxygenating well chest x-ray with left basilar atelectasis but no evidence of acute infection.  Unfortunately she has had large strokes with ineffective airway protection and left-sided neglect.  Her prognosis for functional recovery is poor at  best and will likely need a trach to liberate her from the ventilator.   ASSESSMENT / PLAN:  PULMONARY A: Hypoxemic respiratory failure suspect due to mucous plugging +/- aspiration also consider PE -  Portable chest x-ray reviewed shows improved left retrocardiac atelectasis, ETT and feeding tube remain in satisfactory condition - She has had difficulty maintaining her airway as a consequence of her stroke, she will need tracheostomy for airway protection. P:   Continue full ventilator support Ongoing daily SBT trials  Trend chest x-ray  Cont systemic LMWH PRN BD Appreciate PMT care, family is still considering trach versus one-way extubation.  Will need to clarify prior to  extubation Tobacco cessation when appropriate   CARDIOVASCULAR A:  Hypotension- resolved CVA P:  Telemetry monitoring Goal MAP > 65, SBP goal 130-150 with right ICA occlusion ASA and pravachol per stroke   RENAL A:   Non-anion gap metabolic acidosis Hyperkalemia Mild hyperchloremia AKI Hematuria - + 3.3 L/ net + 21.9 L P:   LR at 75 ml/hr S/p KCL replete Continue oral bicarb 650 mg BID Free water 100 ml q 8 hr Trend chemistry  GASTROINTESTINAL A:   Protein calorie malnutrition Diarrhea- likely from TF P:   Continue tube feeds Continue H2 blockade Family deciding on Peg/ long-term GOC Imodium prn   HEMATOLOGIC A:   [place] anemia complicated by anemia of critical illness/ hematuria Acute LLE and RLE DVT 12/20 - drop in Hgb from 7.6 to 6.7 12/26, transfused 1 unit PRBC - no obvious source of bleeding P:  Await CBC post-transfusion Trend CBC Transfuse per protocol Lovenox full dose therapy for now, consider d/c if Hgb continues to drop  INFECTIOUS A:   S/p HAP Pyelonephritis  MSSA bacteremia P:   Follow-up pending respiratory cultures obtained on 12/23 Nafcillin started on 12/18 Further antibiotic recommendations per infectious disease Continue probiotic   ENDOCRINE A:   Hyperglycemia   P:   Sliding scale insulin  NEUROLOGIC A:   CVA with left-sided hemiparesis embolic versus related to high-grade stenosis (large right cerebellar stroke with petechial hemorrhagic conversion, also acute large right MCA and PCA infarct).  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  Also question about endocarditis  P:   RASS goal is -1 PAD protocol with precedex and prn fentanyl Per Neuro Aspirin for antiplatelet   Family Update:  Palliative care medicine met with patient's husband, son, and family on 12/25 concerning [place].  They are still considering options of trach/ peg versus one-way extubation with transition to comfort  care.  PMT hopeful to follow-up with the husband today.  No family at bedside 12/26 am.  CCT 40 mins  Kennieth Rad, AGACNP-BC [facility] Pgr: [phone] or if no answer [phone] 10/18/2017, 10:00 AM

## 2017-10-18 NOTE — Progress Notes (Signed)
eLink Physician-Brief Progress Note Patient Name: Ashlee Snow DOB: 29-Dec-1955 MRN: [id number]   Date of Service  10/18/2017  HPI/Events of Note  Gradual drop in Hgb now 6.7 from 7.6  REmains HD stable.  On subq anticoagulation.  eICU Interventions  Transfuse 1 unit pRBC Post-transfusion CBC Continue with subq anticoagulation for now but may need to d/c if drop persists     Intervention Category Intermediate Interventions: Other:  Otho Michalik 10/18/2017, 4:11 AM

## 2017-10-19 DIAGNOSIS — Z515 Encounter for palliative care: Secondary | ICD-10-CM

## 2017-10-19 DIAGNOSIS — Z7189 Other specified counseling: Secondary | ICD-10-CM

## 2017-10-19 LAB — BPAM RBC
BLOOD PRODUCT EXPIRATION DATE: 201812272359
ISSUE DATE / TIME: 201812260543
UNIT TYPE AND RH: 9500

## 2017-10-19 LAB — GLUCOSE, CAPILLARY
GLUCOSE-CAPILLARY: 141 mg/dL — AB (ref 65–99)
GLUCOSE-CAPILLARY: 152 mg/dL — AB (ref 65–99)
GLUCOSE-CAPILLARY: 153 mg/dL — AB (ref 65–99)
Glucose-Capillary: 167 mg/dL — ABNORMAL HIGH (ref 65–99)
Glucose-Capillary: 180 mg/dL — ABNORMAL HIGH (ref 65–99)
Glucose-Capillary: 158 mg/dL — ABNORMAL HIGH (ref 65–99)

## 2017-10-19 LAB — TYPE AND SCREEN
ABO/RH(D): O POS
ANTIBODY SCREEN: NEGATIVE
UNIT DIVISION: 0

## 2017-10-19 LAB — CBC
HEMATOCRIT: 34.3 % — AB (ref 36.0–46.0)
HEMOGLOBIN: 11 g/dL — AB (ref 12.0–15.0)
MCH: 30.4 pg (ref 26.0–34.0)
MCHC: 32.1 g/dL (ref 30.0–36.0)
MCV: 94.8 fL (ref 78.0–100.0)
Platelets: 424 10*3/uL — ABNORMAL HIGH (ref 150–400)
RBC: 3.62 MIL/uL — ABNORMAL LOW (ref 3.87–5.11)
RDW: 19.5 % — ABNORMAL HIGH (ref 11.5–15.5)
WBC: 8.7 10*3/uL (ref 4.0–10.5)

## 2017-10-19 LAB — BASIC METABOLIC PANEL
ANION GAP: 12 (ref 5–15)
BUN: 14 mg/dL (ref 6–20)
CALCIUM: 8.4 mg/dL — AB (ref 8.9–10.3)
CO2: 20 mmol/L — AB (ref 22–32)
Chloride: 106 mmol/L (ref 101–111)
Creatinine, Ser: 0.73 mg/dL (ref 0.44–1.00)
GFR calc Af Amer: >60 mL/min (ref >60)
GFR calc non Af Amer: >60 mL/min (ref >60)
GLUCOSE: 175 mg/dL — AB (ref 65–99)
Potassium: 3.6 mmol/L (ref 3.5–5.1)
Sodium: 138 mmol/L (ref 135–145)

## 2017-10-19 LAB — PHOSPHORUS: Phosphorus: 3.1 mg/dL (ref 2.5–4.6)

## 2017-10-19 LAB — TRIGLYCERIDES: Triglycerides: 143 mg/dL (ref <150)

## 2017-10-19 LAB — MAGNESIUM: Magnesium: 1.9 mg/dL (ref 1.7–2.4)

## 2017-10-19 MED ORDER — PROPOFOL 1000 MG/100ML IV EMUL
INTRAVENOUS | Status: AC
  Filled 2017-10-19: qty 100

## 2017-10-19 MED ORDER — ENOXAPARIN SODIUM 80 MG/0.8ML ~~LOC~~ SOLN
65.0000 mg | Freq: Two times a day (BID) | SUBCUTANEOUS | Status: DC
  Administered 2017-10-19 – 2017-10-22 (×7): 65 mg via SUBCUTANEOUS
  Filled 2017-10-19 (×7): qty 0.8

## 2017-10-19 MED ORDER — VITAL AF 1.2 CAL PO LIQD
1000.0000 mL | ORAL | Status: DC
  Administered 2017-10-19 – 2017-10-21 (×3): 1000 mL
  Filled 2017-10-19 (×2): qty 1000

## 2017-10-19 MED ORDER — FUROSEMIDE 10 MG/ML IJ SOLN
40.0000 mg | Freq: Three times a day (TID) | INTRAMUSCULAR | Status: AC
  Administered 2017-10-19 (×2): 40 mg via INTRAVENOUS
  Filled 2017-10-19 (×2): qty 4

## 2017-10-19 MED ORDER — PROPOFOL 1000 MG/100ML IV EMUL
5.0000 ug/kg/min | INTRAVENOUS | Status: DC
  Administered 2017-10-19: 10 ug/kg/min via INTRAVENOUS

## 2017-10-19 MED ORDER — POTASSIUM CHLORIDE 20 MEQ/15ML (10%) PO SOLN
40.0000 meq | Freq: Three times a day (TID) | ORAL | Status: AC
  Administered 2017-10-19 (×2): 40 meq
  Filled 2017-10-19 (×2): qty 30

## 2017-10-19 NOTE — Progress Notes (Signed)
Occupational Therapy Treatment Patient Details Name: Ashlee Snow MRN: [id number] DOB: June 06, 1956 Today's Date: 10/19/2017    History of present illness This is a 61 year old with a past medical history of fibromyalgia, HTN, and hypothyroidism . 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 Additional scattered punctate foci of acute nonhemorrhagic infarct involving the right PCA territory with 1 lesion involving the right MCA territory along the right middle frontal gyrus. Multiple foci of remote encephalomalacia involving the inferior posterior right temporal lobe, lateral right temporal lobe, and high right parietal lobe. Pt intubated 12/14 and extubated 12/19; reintubated 12/21. On 12/20 found to have LLE + for acute DVT in the peroneal veins; RUE acute DVT in the distal right subclavian, axillary, proximal brachial veins.  Superficial vein thrombosis throughout the right cephalic and basilic veins.   OT comments  This 61 yo female admitted with above presents to acute OT making progress with following commands and left eye gaze. She will benefit from continued OT for working on these and mobility for basic ADLs.   Follow Up Recommendations  SNF;Supervision/Assistance - 24 hour    Equipment Recommendations  Other (comment)(TBD at next venue)       Precautions / Restrictions Precautions Precautions: Fall Precaution Comments: Cortrak, ETT, vent, flexiseal, foley, LLE DVT, RUE DVT; IV infiltrate RUE (12/27)--painful Required Braces or Orthoses: Other Brace/Splint Other Brace/Splint: LUE resting hand splint  (2 hours on 2 hours off) Restrictions Weight Bearing Restrictions: No              ADL either performed or assessed with clinical judgement   ADL Overall ADL's : Needs assistance/impaired     Grooming: Wash/dry face;Bed level Grooming  Details (indicate cue type and reason): Placed washcloth in pt's RUE and asked her to wash her face x2 with time given to respond, but did not attempt. A'd pt with hand to face and pt just moved hand and washcloth away, tried again with same result.                                     Vision Baseline Vision/History: Wears glasses Wears Glasses: At all times(they are at home per husband) Additional Comments: Pt still with quite strong right gaze preference. Initially could get her to move her eyes to midline and slightly left momentarily with trying to follow me from a distance (around end of bed)--did this times 3 with increased time and VCs. Then tried up close with allowing pt to find me on her right (I was leaning of the bed from her left) and then once she got me in her sight asking her constantly to keep following me to the left. She was able to do this and move her eyes to the far left and maintain (3-5 seconds) with increased time and constant cueing.          Cognition Arousal/Alertness: Awake/alert Behavior During Therapy: Restless(when messing with her RUE) Overall Cognitive Status: Difficult to assess                                 General Comments: Pt was able to follow 1 step commands 50% of time with increased time at other times you could ask her to do the same thing and she  would not respond. Shook head yes to do yo wear glasses, no to just for reading, and yes to all the time.        Exercises Other Exercises Other Exercises: Checked fit of LUE resting hand splint. Fit is good and RN aware of 2 hours on and 2 hours off. Will need to assess RUE for one as well next session (pt grimace with finger extension (but not sure if this is due to issue of IV in right arm). Pt is moving her RUE some on command intermittently           Pertinent Vitals/ Pain       Pain Assessment: Faces Faces Pain Scale: Hurts whole lot Pain Location: RUE where IV was   Pain Descriptors / Indicators: Grimacing;Restless(pulling arm away, moving legs) Pain Intervention(s): Monitored during session(Made RN aware and she disconnected and removed IV (arm painful, red, swollen, warm))         Frequency  Min 2X/week        Progress Toward Goals  OT Goals(current goals can now be found in the care plan section)  Progress towards OT goals: Progressing toward goals     Plan Discharge plan needs to be updated       AM-PAC PT "6 Clicks" Daily Activity     Outcome Measure   Help from another person eating meals?: Total Help from another person taking care of personal grooming?: Total Help from another person toileting, which includes using toliet, bedpan, or urinal?: Total Help from another person bathing (including washing, rinsing, drying)?: Total Help from another person to put on and taking off regular upper body clothing?: Total Help from another person to put on and taking off regular lower body clothing?: Total 6 Click Score: 6    End of Session    OT Visit Diagnosis: Other abnormalities of gait and mobility (R26.89);Muscle weakness (generalized) (M62.81);Other symptoms and signs involving the nervous system (R29.898);Other symptoms and signs involving cognitive function;Cognitive communication deficit (R41.841);Hemiplegia and hemiparesis;Pain;Low vision, both eyes (H54.2) Symptoms and signs involving cognitive functions: Cerebral infarction Hemiplegia - Right/Left: Left Hemiplegia - dominant/non-dominant: Non-Dominant Hemiplegia - caused by: Cerebral infarction Pain - Right/Left: Right Pain - part of body: Arm(where IV was)   Activity Tolerance Patient tolerated treatment well   Patient Left in bed;with bed alarm set;with restraints reapplied;with call bell/phone within reach;with family/visitor present   Nurse Communication Other (comment)(splint wear)        Time: [id number] OT Time Calculation (min): 28 min  Charges: OT General  Charges $OT Visit: 1 Visit OT Treatments $Self Care/Home Management : 8-22 mins $Therapeutic Activity: 8-22 mins  Ignacia PalmaCathy Destiny Hagin, OTR/L [phone] 10/19/2017

## 2017-10-19 NOTE — Progress Notes (Signed)
ANTICOAGULATION CONSULT NOTE  Pharmacy Consult for Lovenox Indication: DVT  Allergies  Allergen Reactions  . Ativan [Lorazepam] Other (See Comments)    Patient's husband said related to being oversedated not true allergy    Patient Measurements: Height: 5\' 4"  (162.6 cm) Weight: 151 lb 0.2 oz (68.5 kg) IBW/kg (Calculated) : 54.7 Heparin Dosing Weight: 63.9 kg  Vital Signs: Temp: 98.5 F (36.9 C) (12/27 0400) Temp Source: Oral (12/27 0400) BP: 133/67 (12/27 0800) Pulse Rate: 73 (12/27 0800)  Labs: Recent Labs    10/17/17 0337 10/18/17 0256 10/18/17 1210  HGB 7.6* 6.7* 9.0*  HCT 24.3* 21.0* 27.7*  PLT 371 387 363  APTT  --  40*  --   LABPROT  --  15.3*  --   INR  --  1.22  --   CREATININE 0.78 0.75  --     Estimated Creatinine Clearance: 72 mL/min (by C-G formula based on SCr of 0.75 mg/dL).   Medical History: Past Medical History:  Diagnosis Date  . Asthma   . Bronchitis   . DDD (degenerative disc disease)   . Depression   . Dyspnea    on exertion  . Fibromyalgia   . GERD (gastroesophageal reflux disease)    hx. of   . History of depression   . Hypertension   . Hypothyroidism   . Leg pain   . Migraine   . Peripheral arterial disease (HCC)   . Peripheral vascular disease (HCC)   . Sciatica   . Stroke (cerebrum) (HCC) 10/15/2017  . Thyroid disease   . 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 Ulcer     Assessment: 61 yo female s/p R MCA and cerebellar stroke around 12/8 with mass effect and currently being treated for mssa bacteremia. Found to have L lower extremity DVT. Patient was initially started on heparin but was transitioned to Lovenox due to IV issues. hgb dipped to 6.7 yesterday, rec'd 1 unit prbc, hgb up to 9 s/p transfusion.    Goal of Therapy:  Anti-Xa 0.6-1 units/mL Monitor platelets by anticoagulation protocol: Yes    Plan:  -Lovenox 65 mg [place] q12h -F/u long-term plan for  anticoagulation    Agapito GamesAlison Tashya Alberty, PharmD, BCPS Clinical Pharmacist 10/19/2017 9:03 AM

## 2017-10-19 NOTE — Progress Notes (Signed)
Spoke with husband at length, after a long discussion, decision was made to make patient a full DNR, no further escalation of care and family to decide on withdrawal in the next few days.  Will enter orders.  The patient is critically ill with multiple organ systems failure and requires high complexity decision making for assessment and support, frequent evaluation and titration of therapies, application of advanced monitoring technologies and extensive interpretation of multiple databases.   Critical Care Time devoted to patient care services described in this note is  60  Minutes. This time reflects time of care of this signee Dr Koren BoundWesam Yacoub. This critical care time does not reflect procedure time, or teaching time or supervisory time of PA/NP/Med student/Med Resident etc but could involve care discussion time.  Alyson ReedyWesam G. Yacoub, M.D. [facility]. Pager: [phone]. After hours pager: [phone].

## 2017-10-19 NOTE — Progress Notes (Signed)
STROKE TEAM PROGRESS NOTE   SUBJECTIVE (INTERVAL HISTORY) Her husband is at bedside. Pt  Remains intubated . She is lightly sedated on Precedex  but is responsive and follows simple commands Await family decision on trach/PEG versus palliative care   OBJECTIVE Lab Results: CBC:  Recent Labs  Lab 10/17/17 0337 10/18/17 0256 10/18/17 1210  WBC 6.6 6.2 7.1  HGB 7.6* 6.7* 9.0*  HCT 24.3* 21.0* 27.7*  MCV 97.2 96.8 94.5  PLT 371 387 363   BMP: Recent Labs  Lab 10/15/17 0155 10/15/17 1414 10/16/17 0331 10/17/17 0337 10/18/17 0256  NA 141 143 143 139 139  K 3.4* 3.9 3.3* 4.0 3.3*  CL 115* 118* 116* 115* 114*  CO2 18* 17* 17* 15* 17*  GLUCOSE 185* 207* 169* 206* 183*  BUN 19 19 30* 22* 17  CREATININE 0.78 0.78 1.00 0.78 0.75  CALCIUM 8.1* 8.3* 7.8* 8.0* 8.0*  MG  --  2.5* 2.3 2.2 2.0   Liver Function Tests:  Recent Labs  Lab 10/18/17 0256  AST 29  ALT 25  ALKPHOS 78  BILITOT 1.0  PROT 6.2*  ALBUMIN 1.9*   No results for input(s): AMMONIA in the last 168 hours. Cardiac Enzymes:  No results for input(s): CKTOTAL, CKMB, CKMBINDEX, TROPONINI in the last 168 hours. Coagulation Studies:  Recent Labs    10/18/17 0256  APTT 40*  INR 1.22   PHYSICAL EXAM Temp:  [98.2 F (36.8 C)-100.8 F (38.2 C)] 100.8 F (38.2 C) (12/27 1200) Pulse Rate:  [51-109] 92 (12/27 1400) Resp:  [15-28] 27 (12/27 1400) BP: (85-149)/(54-85) 128/60 (12/27 1400) SpO2:  [98 %-100 %] 100 % (12/27 1400) FiO2 (%):  [30 %] 30 % (12/27 1043) Weight:  [151 lb 0.2 oz (68.5 kg)] 151 lb 0.2 oz (68.5 kg) (12/27 0500)   Vitals:   10/19/17 1100 10/19/17 1200 10/19/17 1300 10/19/17 1400  BP: 128/72 (!) 149/72  128/60  Pulse: (!) 108 (!) 105 95 92  Resp: 20 (!) 24 (!) 27 (!) 27  Temp:  (!) 100.8 F (38.2 C)    TempSrc:  Axillary    SpO2: 100% 100% 98% 100%  Weight:      Height:         General - middle-age Caucasian lady, not in acute distress  Ophthalmologic - Fundi not visualized due to  noncooperation.  Cardiovascular - Regular rate and rhythm.  Neuro -patient is intubated and sedated awake alert, eyes spontaneously open, follows only simple commands.  PERRL, not blinking to visual threat on the left, eyes right gaze preference but able to cross midline, left facial droop. Did   follow commands with tongue protrusion. RUE and RLE spontaneous movement against gravity movement, LUE mild extension and LLE 3-/5 withdraw on pain. DTR 1+ and positive babinski bilaterally. Sensation, coordination not cooperative and gait not tested.   IMAGING  I have personally reviewed the radiological images below and agree with the radiology interpretations.  Ct Head Wo Contrast 10/01/2017 IMPRESSION:  No significant change since yesterday. Acute infarction affecting the right cerebellum with swelling. Mass-effect upon the fourth ventricle but no change in size the lateral or third ventricles at this time. No hemorrhage. 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     MRI/MRA Brain Wo Contrast 10/01/2017 IMPRESSION:  1. Acute/subacute inferior right cerebellum and vermis infarct with associated hemorrhage.  2. Additional scattered punctate foci of acute nonhemorrhagic infarct involving the right PCA territory with 1 lesion involving the right MCA territory along  the right middle frontal gyrus.  3. Multiple foci of remote encephalomalacia involving the inferior posterior right temporal lobe, lateral right temporal lobe, and high right parietal lobe.  4. 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 The right A1 and likely M1 segments fill via a patent anterior communicating artery.  5. Fenestration of the vertebrobasilar junction without significant basilar artery stenosis.  6. Marked attenuation of PCA branch vessels, worse on the right. Moderate proximal right PCA stenosis. 7. Fetal type left posterior cerebral artery.    Echocardiogram:   - Left ventricle: The cavity size was normal. Wall thickness was   increased in a pattern of mild LVH. Systolic function was normal.   The estimated ejection fraction was in the range of 60% to 65%.   Wall motion was normal; there were no regional wall motion   abnormalities. Doppler parameters are consistent with abnormal   left ventricular relaxation (grade 1 diastolic dysfunction). - Mitral valve: There was mild regurgitation.  B/L Carotid U/S:                                                Right ECA and ICA waveforms are very similar. Unable to interrogate stenosis.  40-59% left ICA stenosis. Bilateral vertebral artery flow is antegrade.   CT Head: 10/02/17                                                    1. No significant change in the appearance of acute to subacute, hemorrhagic right inferior cerebellar infarct with slight positive mass effect on the adjacent fourth ventricle. No dilatation the third or lateral ventricles. 2. Chronic right parietal, posteromedial temporal and occipital lobe infarcts as well as right periventricular white matter tract lacunar infarct.    EEG 10/02/17:   This normal EEG is recorded in the waking state      Ct Head Wo Contrast 10/06/2017 IMPRESSION:  1. New, large area of cytotoxic edema within the posterior right hemisphere, involving the posterior parietal and temporal lobes and the occipital lobe. This includes portions of the posterior right MCA territory and the PCA territory.  2. Unchanged cytotoxic edema of the right cerebellar hemisphere with mass effect on the fourth ventricle.  3. No herniation, hydrocephalus or hemorrhage.    Ct Angio Head and neck W Or Wo Contrast 10/09/2017 IMPRESSION:  1. Occluded right cavernous ICA and occluded or severely stenotic right M1 segment. Hypertrophic lenticulostriate on the right with prompt filling of right MCA branches, a chronic appearance.   The right cervical ICA is diffusely diminutive,  presumably secondary.  2. Right P2 occlusion.  3. Moderate to advanced left paraclinoid ICA segment narrowing.  4. Poor visualization of the right V1 segment due to artifact and/or atheromatous narrowing.  5. Known acute infarcts in the right posterior cerebrum and right cerebellum. No hydrocephalus, acute hemorrhage, or visible progression.   LE venous doppler -  Right leg, negative for deep and superficial vein thrombosis.  Left leg, positive for acute deep vein thrombosis involving the peroneal veins   UE venous doppler -  Acute deep vein thrombosis in the distal right subclavian, axillary, proximal brachial veins. Superficial vein thrombosis  throughout the right cephalic and basilic veins.   Somewhat difficult exam due to patient position and cooperation                ASSESSMENT: Ms. Leane ParaDebra A Groll is a 61 y.o. female with PMH of prior hypertension, coronary artery disease, hypothyroidism, chronic pain, depression, smoker and peripheral arterial disease status post aorto-iliac bypass in June admitted for fever, confusion, right flank pain. Found to have right pyelonephritis and hydronephrosis. Also MRI showed right cerebellar large infarcts with right MCA scattered infarcts. B Cx showed MSSA. On Abx. However, repeat CT head on 10/06/17 showed new large right MCA infarcts. Concerning for endocarditis.    Acute, large Right cerebellar stroke with petechial hemorrhagic conversion as well as acute large right MCA and PCA infarct - concerning for endocarditis, but also found to have High-grade stenosis or occlusion of the cavernous right internal carotid artery.    Resultant - intubated on sedation, left hemiparesis -> extubated 10/11/2017 but reintubated 10/15/17  CT head 10/02/17 right cerebellar large infarct  MRI head 10/01/17 right cerebellar large infarct and right MCA scattered small infarcts. Old right PCA, right caudate, right parietal high convexity  MRA head - right ICA  cavernous high grade stenosis vs. Occlusion, b/l M1 stenosis, right PCA distal stenosis  CT head 10/06/17 - new large right MCA and PCA infarct with cytotoxic edema  CTA head and neck - right ICA occlusion, right M1 high grade stenosis vs. Occlusion, right P2 occlusion and right V1 high grade stenosis, no mycotic aneurysms  CUS - waveform consistent with right ICA distal stenosis vs. Occlusion  EEG - 10/02/2017 - no abnormalities  TTE EF 60-65%, no vegetation  Consider TEE to rule out endocarditis and also evaluate PFO -cardiology initially turned down request for TEE will shoot for next Monday  Heparin subq for DVT prophylaxis  NPO for now  No antithrombotics PTA, now on ASA 325mg .  Therapy recommendation - CIR recommended  Deposition - pending  Sepsis with pyelonephritis and bacteremia - ? Endocarditis  Intermittent high grade fever with low BP  Admitted for pyelonephritis with hydronephrosis confirmed on CT  B Cx positive for MSSA 10/03/17, repeat culture neg  Urine culture neg  Sputum culture neg  On nafcillin  Due to recurrent stroke, concerning for endocarditis   Consider TEE - shoot for next Monday  Dr Elease HashimotoNahser saw pt 10/03/2017 for possible TEE  Hematuria   Continued hematuria  H&H stable - Hb 8.4->9.6  Continue monitoring  UA 10/06/17 - RBC TNTC, WBC 6-30  CCM on board  Right LE and UE acute DVT  Venous doppler confirmed RLE and RUE DVT -> full dose lovenox  Discussed with Dr. Odessa FlemingSivakuma, was on heparin IV but agree to change to lovenox due to IV access  Consider DOACs once medically stable and no planned procedure  Will need TEE or TCD bubble study later to evaluate PFO  AKI, resolved  Cre 1.64->2.01->1.36->1.73->1.23->1.05->1.13->1.06->0.81->0.78  On IVF and TF  CCM on board   Continue to monitor  Intracranial atherosclerosis and stenosis:  MRA showed right ICA distal occlusion, b/l M1 stenosis, right PCA distal stenosis  CTA head  and neck - right ICA occlusion, right M1 high grade stenosis vs. Occlusion, right P2 occlusion and right V1 high grade stenosis,  CUS consistent with right ICA distal stenosis or occlusion  On ASA now  May consider DAPT on discharge if pt hematuria improves  Will consider cerebral angiogram to further assess right ICA  and MCA occlusion/stenosis once more stabilized  Avoid hypotension  HTN / hypotension  Stable now  D/c'ed amlodipine  Resume TF and continue IVF  BP goal 130-150 due to right ICA occlusion  Avoid hypotension  HYPERLIPIDEMIA  Home Meds:  Pravachol 20 mg  LDL 46,  goal < 70  AST 183->74  ALT 72->22  Resumed pravachol 20  TOBACCO ABUSE  Current smoker  Smoking cessation counseling when applicable  Nicotine patch PRN  Other Active Problems:  Hypernatremia - resolved  Hyperglycemia - improved  Hypoxia -  Seen by CCM 10/13/2017 - pt to remain in unit for now (extubated 10/11/2017)  May not be ready for TEE Monday. Dr Elease Hashimoto saw pt 10-27-17 - note deleted by Dr [facility] day # 19 I had a long discussion with the patient as well as the bedside regarding her prognosis and answered questions about her care. She feels patient would not have wanted to live a life of significant disability which seems unavoidable. He is struggling with the decision about making her comfort care and withdrawal of ventilatory support but feels he will make that soon.  . Discussed with Dr. Marvetta Gibbons palliative care team NP Joycelyn Rua .Await family decision   about 1 way extubation versus tracheostomy and PEG tube. This patient is critically ill and at significant risk of neurological worsening, death and care requires constant monitoring of vital signs, hemodynamics,respiratory and cardiac monitoring, extensive review of multiple databases, frequent neurological assessment, discussion with family, other specialists and medical decision making of high complexity.I have  made any additions or clarifications directly to the above note.This critical care time does not reflect procedure time, or teaching time or supervisory time of PA/NP/Med Resident etc but could involve care discussion time.  I spent 30 minutes of neurocritical care time  in the care of  this patient.     Delia Heady, MD Medical Director [facility] Pager: [phone] 10/19/2017 2:38 PM     To contact Stroke Continuity provider, please refer to [email or web address]. After hours, contact General Neurology

## 2017-10-19 NOTE — Progress Notes (Signed)
RT called to patient's room due to increased RR in the high 40's.  RT suctioned the patient's airway and verified the patient was on full vent support.  RN called CCM.  Sedation was changed.

## 2017-10-19 NOTE — Progress Notes (Signed)
Nutrition Follow-up  DOCUMENTATION CODES:   Not applicable  INTERVENTION:   Tube Feeding:  Vital AF 1.2 @ 65 ml/hr  Provides 1872 kcals, 117 g of protein, 1264 mL of free water Meets 100% protein needs, 104% calorie needs   NUTRITION DIAGNOSIS:   Inadequate oral intake related to inability to eat as evidenced by NPO status.  Being addressed via nutrition support  GOAL:   Patient will meet greater than or equal to 90% of their needs  Met  MONITOR:   TF tolerance, Vent status, Labs, Weight trends, Skin, I & O's  REASON FOR ASSESSMENT:   Ventilator Enteral/tube feeding initiation and management  ASSESSMENT:   Pt with PMH of fibromyalgia, HTN, hypothyroidism who was recent having N/V and treated for urosepsis. 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  12/23 Re-intubated, respiratory failure likely due to inability/ineffective airway protection post large stroke  Patient is currently intubated on ventilator support MV: 15.7 L/min Temp (24hrs), Avg:99.4 F (37.4 C), Min:98.2 F (36.8 C), Max:100.8 F (38.2 C)  Noted pt made DNR today, no further escalation of care with family to decide on withdrawal in next few days  Tolerating Vital AF 1.2 @ 60 ml/hr via Cortrak  Weight trend is up, noted pt net +22 L per I/O flow sheet since admission  Noted 925 ML of stool via rectal tube in 24 hours, 100 mL thus far today.   Nutritional needs re-assessed as pt intubated since last assessment.   Labs: BMP pending for today Meds: LR at 75 ml/hr, precedex, lactobacillus, thiamine  Diet Order:  Diet NPO time specified  EDUCATION NEEDS:   No education needs have been identified at this time  Skin:  Skin Assessment: (skin tear to L buttocks)  Last BM:  240 ml via flexiseal x 24 hrs  Height:   Ht Readings from Last 1 Encounters:  10/06/17 _0  (1.626 m)    Weight:   Wt Readings from Last 1 Encounters:  10/19/17 151 lb 0.2 oz (68.5 kg)    Ideal Body  Weight:  54.5 kg  BMI:  Body mass index is 25.92 kg/m.  Estimated Nutritional Needs:   Kcal:  4932 kcals  Protein:  95-127 g  Fluid:  >/= 1.8 L  Kerman Passey MS, RD, LDN, CNSC [phone] Pager  [phone] Weekend/On-Call Pager

## 2017-10-19 NOTE — Progress Notes (Signed)
PULMONARY / CRITICAL CARE MEDICINE   Name: Ashlee Snow MRN: [id number] DOB: June 13, 1956    ADMISSION DATE:  10/07/2017 CONSULTATION DATE:  10/10/2017  CHIEF COMPLAINT:  Acute hypoxemic respiratory failure  HISTORY OF PRESENT ILLNESS:   This [name] was initially seen in consultation by the [facility] service 12/13 following her admission for fevers, confusion, N&V, and right flank pain.She has a past medical history of fibromyalgia, hypertension, and hypothyroidism who for several days prior to admission had apparently been suffering from nausea and vomiting.  She subsequently had fevers and was evaluated at an outside hospital where she was found to have left hydronephrosis.  A ureteral stent was placed and she was treated for urosepsis with antibiotics and generous fluids.  She grew MSSA from her blood. She was seen by PCCM as her oxygen requirements increased substantially on 12/10 and Levaquin  was added to nafcillin to cover for presumed aspiration. PCXR showed increasing infiltrate in the LLL  Zosyn was subsequently substituted for both.   Here hospital course was complicated by acute/subacute inferior right cerebellum and vermis infarct with associated hemorrhage. 2. Additional scattered punctate foci of acute nonhemorrhagic infarct involving the right PCA territory with 1 lesion involving the right MCA territory along the right middle frontal gyrus. 3. Multiple foci of remote encephalomalacia involving the inferior posterior right temporal lobe, lateral right temporal lobe, and high right parietal lobe. 4. 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 intubation, from which she was extubated on 12/19.  On 12/23 the patient became acutely hypoxemic with respiratory distress necessitating transfer to the ICU. ABGs on NRM were PO2 46.5, PCO2 22.7 and pH 7.499.   SUBJECTIVE:  No events overnight, weaning on 5/5.  VITAL SIGNS: BP 133/67   Pulse 73    Temp 98.5 F (36.9 C) (Oral)   Resp 20   Ht 5\' 4"  (1.626 m)   Wt 68.5 kg (151 lb 0.2 oz)   SpO2 100%   BMI 25.92 kg/m    HEMODYNAMICS:    VENTILATOR SETTINGS: Vent Mode: PSV;CPAP FiO2 (%):  [30 %] 30 % Set Rate:  [16 bmp] 16 bmp Vt Set:  [500 mL] 500 mL PEEP:  [5 cmH20] 5 cmH20 Pressure Support:  [5 cmH20-8 cmH20] 5 cmH20 Plateau Pressure:  [18 cmH20-21 cmH20] 21 cmH20  INTAKE / OUTPUT: I/O last 3 completed shifts: In: 6254.3 [I.V.:3051.3; Blood:375; NG/GT:2228; IV Piggyback:600] Out: 3057 [Urine:2132; Stool:925]  PHYSICAL EXAMINATION: General: Critically ill appearing female, NAD, weaning HEENT: [place]/AT, PERRL, EOM-I and MMM Neuro: Awake and interactive, hemiparesis noted, weak gag CV: RRR, Nl S1/S2, -M/R/G PULM: CTA bilaterally GI: Soft, NT, ND and +BS Extremities: -edema and -tenderness Skin: no rashes, scattered bruises to bilateral arms  LABS:  BMET Recent Labs  Lab 10/16/17 0331 10/17/17 0337 10/18/17 0256  NA 143 139 139  K 3.3* 4.0 3.3*  CL 116* 115* 114*  CO2 17* 15* 17*  BUN 30* 22* 17  CREATININE 1.00 0.78 0.75  GLUCOSE 169* 206* 183*   Electrolytes Recent Labs  Lab 10/16/17 0331 10/17/17 0337 10/18/17 0256  CALCIUM 7.8* 8.0* 8.0*  MG 2.3 2.2 2.0   CBC Recent Labs  Lab 10/17/17 0337 10/18/17 0256 10/18/17 1210  WBC 6.6 6.2 7.1  HGB 7.6* 6.7* 9.0*  HCT 24.3* 21.0* 27.7*  PLT 371 387 363   Coag's Recent Labs  Lab 10/18/17 0256  APTT 40*  INR 1.22   Sepsis Markers No results for  input(s): LATICACIDVEN, PROCALCITON, O2SATVEN in the last 168 hours.  ABG Recent Labs  Lab 10/15/17 1754 10/16/17 0412 10/18/17 0430  PHART 7.441 7.414 7.483*  PCO2ART 25.7* 28.1* 23.3*  PO2ART 58.0* 144* 88.7   Liver Enzymes Recent Labs  Lab 10/18/17 0256  AST 29  ALT 25  ALKPHOS 78  BILITOT 1.0  ALBUMIN 1.9*   Cardiac Enzymes No results for input(s): TROPONINI, PROBNP in the last 168 hours.  Glucose Recent Labs  Lab  10/18/17 1156 10/18/17 1539 10/18/17 2004 10/18/17 2304 10/19/17 0330 10/19/17 0726  GLUCAP 145* 153* 158* 169* 141* 152*   Imaging No results found. STUDIES:  Echo 12/09 >> Left ventricle: The cavity size was normal. Wall thickness was   increased in a pattern of mild LVH. Systolic function was normal.   The estimated ejection fraction was in the range of 60% to 65%.   Wall motion was normal; there were no regional wall motion   abnormalities. Doppler parameters are consistent with abnormal   left ventricular relaxation (grade 1 diastolic dysfunction). - Mitral valve: There was mild regurgitation  Vascular US Upper 12/19 >> Right Acute deep vein thrombosis in the distal right subclavian, axillary, brachial veins. Superficial vein thrombosis throughout the right cephalic and basilic veins. Somewhat difficult study. Left Visualized portions of the left IJV and Subclavian appear negative for deep vein Thrombosis  Vas US lower 12/19 >> Right: There is no evidence of deep vein thrombosis in the lower extremity.There is no evidence of superficial venous thrombosis. Left: There is evidence of acute DVT in the Peroneal vein. There is no evidence of superficial venous thrombosis.  CULTURES: Respiratory culture 12/23 >> Blood cultures x2 12/23 >> Urine culture 12/23 >> Blood cultures 12/18: Negative Blood cultures 12/11: negative Urine culture 12/14: negative Stool C-diff 12/08: negative Blood cultures 12/08:  MSSA  ANTIBIOTICS: Nafcillin 12/18>>> Zosyn  12/12  >> 12/18 Vancomycin 12/08  >> 12/14  LINES/TUBES: ETT 12/14 to 12/19; 12/23 >> Left cortrak feeding tube 12/23 >> PIV x 2 Foley Flexiseal   DISCUSSION: 61 y.o. F with CVA, recent HAP with new hypoxemic respiratory failure on 12/23 requiring re-intubation.  Suspect this was due to inability/ineffective airway protection.  Oxygenating well chest x-ray with left basilar atelectasis but no evidence of acute infection.   Unfortunately she has had large strokes with ineffective airway protection and left-sided neglect.  Her prognosis for functional recovery is poor at best and will likely need a trach to liberate her from the ventilator.   ASSESSMENT / PLAN:  PULMONARY A: Hypoxemic respiratory failure suspect due to mucous plugging +/- aspiration also consider PE -  Portable chest x-ray reviewed shows improved left retrocardiac atelectasis, ETT and feeding tube remain in satisfactory condition - She has had difficulty maintaining her airway as a consequence of her stroke, she will need tracheostomy for airway protection. P:   Maintain on PS as tolerated, no extubation given previous history Family need to determine trach/peg vs withdrawal situation Ongoing daily SBT trials  Trend chest x-ray  Cont systemic LMWH PRN BD Appreciate PMT care, family is still considering trach versus one-way extubation.  Will need to clarify prior to extubation  CARDIOVASCULAR A:  Hypotension- resolved CVA P:  Telemetry monitoring Goal MAP > 65, SBP goal 130-150 with right ICA occlusion ASA and pravachol per stroke   RENAL A:   Non-anion gap metabolic acidosis Hyperkalemia Mild hyperchloremia AKI Hematuria - + 3.3 L/ net + 21.9 L P:   KVO IVF  Lasix 40 mg IV q8 x2 doses Replace electrolytes as indicated KCl 40 meq PO q8 x2 doses Continue oral bicarb 650 mg BID Free water 100 ml q 8 hr BMET in AM  GASTROINTESTINAL A:   Protein calorie malnutrition Diarrhea- likely from TF P:   Continue tube feeds Continue H2 blockade Family deciding on Peg/ long-term GOC Imodium prn   HEMATOLOGIC A:   [place]/ anemia complicated by anemia of critical illness/ hematuria Acute LLE and RLE DVT 12/20 - drop in Hgb from 7.6 to 6.7 12/26, transfused 1 unit PRBC - no obvious source of bleeding P:  Trend CBC Transfuse per protocol Lovenox full dose therapy for now, consider d/c if Hgb continues to drop  INFECTIOUS A:    S/p HAP Pyelonephritis  MSSA bacteremia P:   Follow-up pending respiratory cultures obtained on 12/23 Nafcillin started on 12/18 Further antibiotic recommendations per infectious disease Continue probiotic   ENDOCRINE A:   Hyperglycemia   P:   Sliding scale insulin  NEUROLOGIC A:   CVA with left-sided hemiparesis embolic versus related to high-grade stenosis (large right cerebellar stroke with petechial hemorrhagic conversion, also acute large right MCA and PCA infarct).  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  Also question about endocarditis  P:   RASS goal is -1 PAD protocol with precedex and prn fentanyl Per Neuro Aspirin for antiplatelet Minimize sedation  Family Update:  No family bedside, palliative care to meet with family today regarding plan of care.  The patient is critically ill with multiple organ systems failure and requires high complexity decision making for assessment and support, frequent evaluation and titration of therapies, application of advanced monitoring technologies and extensive interpretation of multiple databases.   Critical Care Time devoted to patient care services described in this note is  35  Minutes. This time reflects time of care of this signee Dr Koren BoundWesam Sherrel Snow. This critical care time does not reflect procedure time, or teaching time or supervisory time of PA/NP/Med student/Med Resident etc but could involve care discussion time.  Ashlee ReedyWesam G. October Snow, M.D. [facility]monary/Critical Care Medicine. Pager: [phone]. After hours pager: [phone].  10/19/2017, 9:06 AM

## 2017-10-20 ENCOUNTER — Inpatient Hospital Stay (HOSPITAL_COMMUNITY): Payer: BLUE CROSS/BLUE SHIELD

## 2017-10-20 LAB — CBC
HEMATOCRIT: 31.1 % — AB (ref 36.0–46.0)
HEMOGLOBIN: 9.9 g/dL — AB (ref 12.0–15.0)
MCH: 30.3 pg (ref 26.0–34.0)
MCHC: 31.8 g/dL (ref 30.0–36.0)
MCV: 95.1 fL (ref 78.0–100.0)
Platelets: 377 10*3/uL (ref 150–400)
RBC: 3.27 MIL/uL — ABNORMAL LOW (ref 3.87–5.11)
RDW: 19.9 % — ABNORMAL HIGH (ref 11.5–15.5)
WBC: 6.9 10*3/uL (ref 4.0–10.5)

## 2017-10-20 LAB — BLOOD GAS, ARTERIAL
ACID-BASE DEFICIT: 2 mmol/L (ref 0.0–2.0)
BICARBONATE: 20.8 mmol/L (ref 20.0–28.0)
Drawn by: 270271
FIO2: 40
LHR: 12 {breaths}/min
O2 Saturation: 97 %
PATIENT TEMPERATURE: 98.6
PEEP/CPAP: 5 cmH2O
PH ART: 7.496 — AB (ref 7.350–7.450)
PO2 ART: 83.4 mmHg (ref 83.0–108.0)
pCO2 arterial: 27.2 mmHg — ABNORMAL LOW (ref 32.0–48.0)

## 2017-10-20 LAB — BASIC METABOLIC PANEL
ANION GAP: 11 (ref 5–15)
BUN: 14 mg/dL (ref 6–20)
CHLORIDE: 107 mmol/L (ref 101–111)
CO2: 21 mmol/L — ABNORMAL LOW (ref 22–32)
Calcium: 8.2 mg/dL — ABNORMAL LOW (ref 8.9–10.3)
Creatinine, Ser: 0.78 mg/dL (ref 0.44–1.00)
GFR calc non Af Amer: >60 mL/min (ref >60)
Glucose, Bld: 138 mg/dL — ABNORMAL HIGH (ref 65–99)
POTASSIUM: 4 mmol/L (ref 3.5–5.1)
SODIUM: 139 mmol/L (ref 135–145)

## 2017-10-20 LAB — GLUCOSE, CAPILLARY
GLUCOSE-CAPILLARY: 163 mg/dL — AB (ref 65–99)
GLUCOSE-CAPILLARY: 138 mg/dL — AB (ref 65–99)
Glucose-Capillary: 138 mg/dL — ABNORMAL HIGH (ref 65–99)
Glucose-Capillary: 143 mg/dL — ABNORMAL HIGH (ref 65–99)
Glucose-Capillary: 153 mg/dL — ABNORMAL HIGH (ref 65–99)
Glucose-Capillary: 163 mg/dL — ABNORMAL HIGH (ref 65–99)

## 2017-10-20 LAB — MAGNESIUM: MAGNESIUM: 1.9 mg/dL (ref 1.7–2.4)

## 2017-10-20 LAB — CULTURE, BLOOD (ROUTINE X 2)
CULTURE: NO GROWTH
Culture: NO GROWTH
Special Requests: ADEQUATE
Special Requests: ADEQUATE

## 2017-10-20 LAB — PHOSPHORUS: Phosphorus: 3.8 mg/dL (ref 2.5–4.6)

## 2017-10-20 MED ORDER — PROPOFOL 1000 MG/100ML IV EMUL
5.0000 ug/kg/min | INTRAVENOUS | Status: DC
  Administered 2017-10-20: 35 ug/kg/min via INTRAVENOUS
  Administered 2017-10-20: 45 ug/kg/min via INTRAVENOUS
  Administered 2017-10-20 – 2017-10-21 (×5): 40 ug/kg/min via INTRAVENOUS
  Administered 2017-10-22 (×2): 50 ug/kg/min via INTRAVENOUS
  Administered 2017-10-22: 25 ug/kg/min via INTRAVENOUS
  Filled 2017-10-20 (×11): qty 100

## 2017-10-20 NOTE — Progress Notes (Signed)
Patient ID: Leane Paraebra A Baccari, female   DOB: [birth year], 61 y.o.   MRN: [id number]   This NP attempted to contact husband for continued support and conversation regarding plan of care without success.  Spoke to nursing and they updated me that CCM have spoken with husband and are moving forward with a plan with DNR status documented  and anticipated one way extubation on Sunday  Husband is aware of how to contact PMT for needs and support.  Will continue to shadow for needs  No charge  Lorinda CreedMary Layton Naves NP  Palliative Medicine Team Team Phone # [phone] Pager [phone]

## 2017-10-20 NOTE — Progress Notes (Signed)
PULMONARY / CRITICAL CARE MEDICINE   Name: Ashlee Snow MRN: [id number] DOB: 1956/08/13    ADMISSION DATE:  10/22/2017 CONSULTATION DATE:  10/28/2017  CHIEF COMPLAINT:  Acute hypoxemic respiratory failure  HISTORY OF PRESENT ILLNESS:   This [name] was initially seen in consultation by the [facility] service 12/13 following her admission for fevers, confusion, N&V, and right flank pain.She has a past medical history of fibromyalgia, hypertension, and hypothyroidism who for several days prior to admission had apparently been suffering from nausea and vomiting.  She subsequently had fevers and was evaluated at an outside hospital where she was found to have left hydronephrosis.  A ureteral stent was placed and she was treated for urosepsis with antibiotics and generous fluids.  She grew MSSA from her blood. She was seen by PCCM as her oxygen requirements increased substantially on 12/10 and Levaquin  was added to nafcillin to cover for presumed aspiration. PCXR showed increasing infiltrate in the LLL  Zosyn was subsequently substituted for both.   Here hospital course was complicated by acute/subacute inferior right cerebellum and vermis infarct with associated hemorrhage. 2. Additional scattered punctate foci of acute nonhemorrhagic infarct involving the right PCA territory with 1 lesion involving the right MCA territory along the right middle frontal gyrus. 3. Multiple foci of remote encephalomalacia involving the inferior posterior right temporal lobe, lateral right temporal lobe, and high right parietal lobe. 4. 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 intubation, from which she was extubated on 12/19.  On 12/23 the patient became acutely hypoxemic with respiratory distress necessitating transfer to the ICU. ABGs on NRM were PO2 46.5, PCO2 22.7 and pH 7.499.   SUBJECTIVE:  After family discussion 12/27, husband decided for full DNR.  Awaiting family  to come in, expected one-way extubation possibly Sunday. Better sedated and vent synchrony on propofol gtt  VITAL SIGNS: BP (!) 116/55   Pulse 74   Temp 100.3 F (37.9 C) (Axillary)   Resp (!) 23   Ht 5\' 4"  (1.626 m)   Wt 148 lb 2.4 oz (67.2 kg)   SpO2 100%   BMI 25.43 kg/m   HEMODYNAMICS:    VENTILATOR SETTINGS: Vent Mode: PRVC FiO2 (%):  [30 %] 30 % Set Rate:  [12 bmp-16 bmp] 12 bmp Vt Set:  [500 mL] 500 mL PEEP:  [5 cmH20] 5 cmH20 Plateau Pressure:  [19 cmH20-20 cmH20] 19 cmH20  INTAKE / OUTPUT: I/O last 3 completed shifts: In: 7042 [I.V.:3068.2; NG/GT:3173.8; IV Piggyback:800] Out: 6097 [Urine:5022; Stool:1075]  PHYSICAL EXAMINATION: General:  Critically ill appearing female on MV in NAD HEENT: MM pink/moist, pupils 4/reactive, ETT, right cortrak Neuro: sedated, opens eyes to verbal, not f/c, moves R side and LLE somewhat, no movement in LUE CV:  RRR, 2+ pulses PULM: even/non-labored on MV, lungs bilaterally clear GI: soft, bs active, + foley and flexiseal  Extremities: warm/dry, no edema  Skin: no rashes, scattered bruising to arms  LABS:  BMET Recent Labs  Lab 10/18/17 0256 10/19/17 1415 10/20/17 0416  NA 139 138 139  K 3.3* 3.6 4.0  CL 114* 106 107  CO2 17* 20* 21*  BUN 17 14 14   CREATININE 0.75 0.73 0.78  GLUCOSE 183* 175* 138*   Electrolytes Recent Labs  Lab 10/18/17 0256 10/19/17 1415 10/20/17 0416  CALCIUM 8.0* 8.4* 8.2*  MG 2.0 1.9 1.9  PHOS  --  3.1 3.8   CBC Recent Labs  Lab 10/18/17 1210 10/19/17 1415  10/20/17 0416  WBC 7.1 8.7 6.9  HGB 9.0* 11.0* 9.9*  HCT 27.7* 34.3* 31.1*  PLT 363 424* 377   Coag's Recent Labs  Lab 10/18/17 0256  APTT 40*  INR 1.22   Sepsis Markers No results for input(s): LATICACIDVEN, PROCALCITON, O2SATVEN in the last 168 hours.  ABG Recent Labs  Lab 10/16/17 0412 10/18/17 0430 10/20/17 0348  PHART 7.414 7.483* 7.496*  PCO2ART 28.1* 23.3* 27.2*  PO2ART 144* 88.7 83.4   Liver  Enzymes Recent Labs  Lab 10/18/17 0256  AST 29  ALT 25  ALKPHOS 78  BILITOT 1.0  ALBUMIN 1.9*   Cardiac Enzymes No results for input(s): TROPONINI, PROBNP in the last 168 hours.  Glucose Recent Labs  Lab 10/19/17 1219 10/19/17 1550 10/19/17 1942 10/19/17 2319 10/20/17 0351 10/20/17 0732  GLUCAP 167* 180* 158* 153* 138* 153*   Imaging Dg Chest Port 1 View  Result Date: 10/20/2017 CLINICAL DATA:  Endotracheal tube in place. EXAM: PORTABLE CHEST 1 VIEW COMPARISON:  One-view chest x-ray 07/19/2017 FINDINGS: The heart size is normal. Endotracheal tube is stable. Feeding tube courses off the inferior border of the film. Asymmetric left lower lobe airspace disease is present. Lung volumes have improved. IMPRESSION: 1. Support apparatus stable. 2. Improving lung volumes. 3. Persistent asymmetric left lower lobe airspace disease compatible with infection or atelectasis. Electronically Signed   By: Marin Robertshristopher  Mattern M.D.   On: 10/20/2017 07:43   STUDIES:  Echo 12/09 >> Left ventricle: The cavity size was normal. Wall thickness was   increased in a pattern of mild LVH. Systolic function was normal.   The estimated ejection fraction was in the range of 60% to 65%.   Wall motion was normal; there were no regional wall motion   abnormalities. Doppler parameters are consistent with abnormal   left ventricular relaxation (grade 1 diastolic dysfunction). - Mitral valve: There was mild regurgitation  Vascular US Upper 12/19 >> Right Acute deep vein thrombosis in the distal right subclavian, axillary, brachial veins. Superficial vein thrombosis throughout the right cephalic and basilic veins. Somewhat difficult study. Left Visualized portions of the left IJV and Subclavian appear negative for deep vein Thrombosis  Vas US lower 12/19 >> Right: There is no evidence of deep vein thrombosis in the lower extremity.There is no evidence of superficial venous thrombosis. Left: There is  evidence of acute DVT in the Peroneal vein. There is no evidence of superficial venous thrombosis.  CULTURES: Respiratory culture 12/23 >> ngtd >> Blood cultures x2 12/23 >> ngtd >> Urine culture 12/23 >> Neg Blood cultures 12/18: Negative Blood cultures 12/11: negative Urine culture 12/14: negative Stool C-diff 12/08: negative Blood cultures 12/08:  MSSA  ANTIBIOTICS: Nafcillin 12/18>>> Zosyn  12/12  >> 12/18 Vancomycin 12/08  >> 12/14  LINES/TUBES: ETT 12/14 to 12/19; 12/23 >> Left cortrak feeding tube 12/23 >> PIV x 2 Foley Flexiseal   DISCUSSION: 61 y.o. F with CVA, recent HAP with new hypoxemic respiratory failure on 12/23 requiring re-intubation.  Suspect this was due to inability/ineffective airway protection.  Oxygenating well chest x-ray with left basilar atelectasis but no evidence of acute infection.  Unfortunately she has had large strokes with ineffective airway protection and left-sided neglect.  Her prognosis for functional recovery is poor at best and will likely need a trach to liberate her from the ventilator.   ASSESSMENT / PLAN:  PULMONARY A: Hypoxemic respiratory failure suspect due to mucous plugging +/- aspiration also consider PE - She has had difficulty maintaining  her airway as a consequence of her stroke, she will need tracheostomy for airway protection.  Husband has decided against trach/peg, for full DNR with plans to transition to comfort care/ one-way extubation after family arrives  P:   Continue Full vent support Full DNR Awaiting family to arrive prior to one-way extubation, transition to comfort care.  CARDIOVASCULAR A:  Hypotension- resolved CVA P:  Telemetry monitoring Continue ASA and pravachol   RENAL A:   Non-anion gap metabolic acidosis Hyperkalemia Mild hyperchloremia AKI Hematuria P:   Continue LR at 75 Trend UO  GASTROINTESTINAL A:   Protein calorie malnutrition Diarrhea- likely from TF P:   Continue tube  feeds Continue H2 blockade No PEG tube Imodium prn   HEMATOLOGIC A:   [place] anemia complicated by anemia of critical illness/ hematuria Acute LLE and RLE DVT 12/20 - drop in Hgb from 7.6 to 6.7 12/26, transfused 1 unit PRBC - no obvious source of bleeding P:  Lovenox full dose therapy  INFECTIOUS A:   S/p HAP Pyelonephritis  MSSA bacteremia P:   Continue Nafcillin Continue probiotic   ENDOCRINE A:   Hyperglycemia   P:   Sliding scale insulin CBG q 4  NEUROLOGIC A:   CVA with left-sided hemiparesis embolic versus related to high-grade stenosis (large right cerebellar stroke with petechial hemorrhagic conversion, also acute large right MCA and PCA infarct).  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  Also question about endocarditis  P:   RASS goal is -1 PAD protocol with propofol and prn fentanyl Per Neuro Aspirin for antiplatelet Minimize sedation  Family Update:  No family bedside 12/28.  Per discussion 12/27, patient now full DNR.  Awaiting family to arrive with plans for one- way extubation on 12/30 and transition to full comfort care if needed.  Will continue current therapies with no escalation of therapy.    CCT 30 mins  Posey BoyerBrooke Dailah Opperman, AGACNP-BC [facility] Pgr: [phone] or if no answer [phone] 10/20/2017, 8:13 AM

## 2017-10-20 NOTE — Progress Notes (Signed)
STROKE TEAM PROGRESS NOTE   SUBJECTIVE (INTERVAL HISTORY) Her husband is not at bedside. Pt  remains intubated . She is lightly sedated on Precedex  but is responsive and follows only occasional simple commands.Her  husband has decided on DO NOT RESUSCITATE and plans to the drive ventilatory support on 10/22/17   OBJECTIVE Lab Results: CBC:  Recent Labs  Lab 10/18/17 1210 10/19/17 1415 10/20/17 0416  WBC 7.1 8.7 6.9  HGB 9.0* 11.0* 9.9*  HCT 27.7* 34.3* 31.1*  MCV 94.5 94.8 95.1  PLT 363 424* 377   BMP: Recent Labs  Lab 10/16/17 0331 10/17/17 0337 10/18/17 0256 10/19/17 1415 10/20/17 0416  NA 143 139 139 138 139  K 3.3* 4.0 3.3* 3.6 4.0  CL 116* 115* 114* 106 107  CO2 17* 15* 17* 20* 21*  GLUCOSE 169* 206* 183* 175* 138*  BUN 30* 22* 17 14 14   CREATININE 1.00 0.78 0.75 0.73 0.78  CALCIUM 7.8* 8.0* 8.0* 8.4* 8.2*  MG 2.3 2.2 2.0 1.9 1.9  PHOS  --   --   --  3.1 3.8   Liver Function Tests:  Recent Labs  Lab 10/18/17 0256  AST 29  ALT 25  ALKPHOS 78  BILITOT 1.0  PROT 6.2*  ALBUMIN 1.9*   No results for input(s): AMMONIA in the last 168 hours. Cardiac Enzymes:  No results for input(s): CKTOTAL, CKMB, CKMBINDEX, TROPONINI in the last 168 hours. Coagulation Studies:  Recent Labs    10/18/17 0256  APTT 40*  INR 1.22   PHYSICAL EXAM Temp:  [99 F (37.2 C)-102 F (38.9 C)] 99 F (37.2 C) (12/28 1200) Pulse Rate:  [72-104] 73 (12/28 1300) Resp:  [20-42] 20 (12/28 1300) BP: (97-157)/(54-109) 100/57 (12/28 1300) SpO2:  [93 %-100 %] 99 % (12/28 1300) FiO2 (%):  [30 %] 30 % (12/28 1200) Weight:  [148 lb 2.4 oz (67.2 kg)] 148 lb 2.4 oz (67.2 kg) (12/28 0331)   Vitals:   10/20/17 1100 10/20/17 1124 10/20/17 1200 10/20/17 1300  BP: 130/72 130/72 122/62 (!) 100/57  Pulse: 85 74 79 73  Resp: (!) 27 (!) 23 (!) 29 20  Temp:   99 F (37.2 C)   TempSrc:   Axillary   SpO2: 99% 99% 100% 99%  Weight:      Height:         General - middle-age Caucasian lady,  not in acute distress  Ophthalmologic - Fundi not visualized due to noncooperation.  Cardiovascular - Regular rate and rhythm.  Neuro -patient is intubated and sedated awake alert, eyes spontaneously open, follows only simple commands.  PERRL, not blinking to visual threat on the left, eyes right gaze preference but able to cross midline, left facial droop. Did   follow commands with tongue protrusion. RUE and RLE spontaneous movement against gravity movement, LUE mild extension and LLE 3-/5 withdraw on pain. DTR 1+ and positive babinski bilaterally. Sensation, coordination not cooperative and gait not tested.   IMAGING  I have personally reviewed the radiological images below and agree with the radiology interpretations.  Ct Head Wo Contrast 10/01/2017 IMPRESSION:  No significant change since yesterday. Acute infarction affecting the right cerebellum with swelling. Mass-effect upon the fourth ventricle but no change in size the lateral or third ventricles at this time. No hemorrhage. 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     MRI/MRA Brain Wo Contrast 10/01/2017 IMPRESSION:  1. Acute/subacute inferior right cerebellum and vermis infarct with associated hemorrhage.  2. Additional scattered  punctate foci of acute nonhemorrhagic infarct involving the right PCA territory with 1 lesion involving the right MCA territory along the right middle frontal gyrus.  3. Multiple foci of remote encephalomalacia involving the inferior posterior right temporal lobe, lateral right temporal lobe, and high right parietal lobe.  4. 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 The right A1 and likely M1 segments fill via a patent anterior communicating artery.  5. Fenestration of the vertebrobasilar junction without significant basilar artery stenosis.  6. Marked attenuation of PCA branch vessels, worse on the right. Moderate proximal right PCA  stenosis. 7. Fetal type left posterior cerebral artery.    Echocardiogram:  - Left ventricle: The cavity size was normal. Wall thickness was   increased in a pattern of mild LVH. Systolic function was normal.   The estimated ejection fraction was in the range of 60% to 65%.   Wall motion was normal; there were no regional wall motion   abnormalities. Doppler parameters are consistent with abnormal   left ventricular relaxation (grade 1 diastolic dysfunction). - Mitral valve: There was mild regurgitation.  B/L Carotid U/S:                                                Right ECA and ICA waveforms are very similar. Unable to interrogate stenosis.  40-59% left ICA stenosis. Bilateral vertebral artery flow is antegrade.   CT Head: 10/02/17                                                    1. No significant change in the appearance of acute to subacute, hemorrhagic right inferior cerebellar infarct with slight positive mass effect on the adjacent fourth ventricle. No dilatation the third or lateral ventricles. 2. Chronic right parietal, posteromedial temporal and occipital lobe infarcts as well as right periventricular white matter tract lacunar infarct.    EEG 10/02/17:   This normal EEG is recorded in the waking state      Ct Head Wo Contrast 10/06/2017 IMPRESSION:  1. New, large area of cytotoxic edema within the posterior right hemisphere, involving the posterior parietal and temporal lobes and the occipital lobe. This includes portions of the posterior right MCA territory and the PCA territory.  2. Unchanged cytotoxic edema of the right cerebellar hemisphere with mass effect on the fourth ventricle.  3. No herniation, hydrocephalus or hemorrhage.    Ct Angio Head and neck W Or Wo Contrast 10/09/2017 IMPRESSION:  1. Occluded right cavernous ICA and occluded or severely stenotic right M1 segment. Hypertrophic lenticulostriate on the right with prompt filling of right MCA  branches, a chronic appearance.   The right cervical ICA is diffusely diminutive, presumably secondary.  2. Right P2 occlusion.  3. Moderate to advanced left paraclinoid ICA segment narrowing.  4. Poor visualization of the right V1 segment due to artifact and/or atheromatous narrowing.  5. Known acute infarcts in the right posterior cerebrum and right cerebellum. No hydrocephalus, acute hemorrhage, or visible progression.   LE venous doppler -  Right leg, negative for deep and superficial vein thrombosis.  Left leg, positive for acute deep vein thrombosis involving the peroneal veins   UE  venous doppler -  Acute deep vein thrombosis in the distal right subclavian, axillary, proximal brachial veins. Superficial vein thrombosis throughout the right cephalic and basilic veins.   Somewhat difficult exam due to patient position and cooperation                ASSESSMENT: Ms. Ashlee Snow is a 61 y.o. female with PMH of prior hypertension, coronary artery disease, hypothyroidism, chronic pain, depression, smoker and peripheral arterial disease status post aorto-iliac bypass in June admitted for fever, confusion, right flank pain. Found to have right pyelonephritis and hydronephrosis. Also MRI showed right cerebellar large infarcts with right MCA scattered infarcts. B Cx showed MSSA. On Abx. However, repeat CT head on 10/06/17 showed new large right MCA infarcts. Concerning for endocarditis.    Acute, large Right cerebellar stroke with petechial hemorrhagic conversion as well as acute large right MCA and PCA infarct - concerning for endocarditis, but also found to have High-grade stenosis or occlusion of the cavernous right internal carotid artery.    Resultant - intubated on sedation, left hemiparesis -> extubated 10/11/2017 but reintubated 10/15/17  CT head 10/02/17 right cerebellar large infarct  MRI head 10/01/17 right cerebellar large infarct and right MCA scattered small infarcts. Old right  PCA, right caudate, right parietal high convexity  MRA head - right ICA cavernous high grade stenosis vs. Occlusion, b/l M1 stenosis, right PCA distal stenosis  CT head 10/06/17 - new large right MCA and PCA infarct with cytotoxic edema  CTA head and neck - right ICA occlusion, right M1 high grade stenosis vs. Occlusion, right P2 occlusion and right V1 high grade stenosis, no mycotic aneurysms  CUS - waveform consistent with right ICA distal stenosis vs. Occlusion  EEG - 10/02/2017 - no abnormalities  TTE EF 60-65%, no vegetation  Consider TEE to rule out endocarditis and also evaluate PFO -cardiology initially turned down request for TEE will shoot for next Monday  Heparin subq for DVT prophylaxis  NPO for now  No antithrombotics PTA, now on ASA 325mg .  Therapy recommendation - CIR recommended  Deposition - pending  Sepsis with pyelonephritis and bacteremia - ? Endocarditis  Intermittent high grade fever with low BP  Admitted for pyelonephritis with hydronephrosis confirmed on CT  B Cx positive for MSSA 10/03/17, repeat culture neg  Urine culture neg  Sputum culture neg  On nafcillin  Due to recurrent stroke, concerning for endocarditis   Consider TEE - shoot for next Monday  Dr Elease Hashimoto saw pt 10/14/2017 for possible TEE  Hematuria   Continued hematuria  H&H stable - Hb 8.4->9.6  Continue monitoring  UA 10/06/17 - RBC TNTC, WBC 6-30  CCM on board  Right LE and UE acute DVT  Venous doppler confirmed RLE and RUE DVT -> full dose lovenox  Discussed with Dr. Odessa Fleming, was on heparin IV but agree to change to lovenox due to IV access  Consider DOACs once medically stable and no planned procedure  Will need TEE or TCD bubble study later to evaluate PFO  AKI, resolved  Cre 1.64->2.01->1.36->1.73->1.23->1.05->1.13->1.06->0.81->0.78  On IVF and TF  CCM on board   Continue to monitor  Intracranial atherosclerosis and stenosis:  MRA showed right  ICA distal occlusion, b/l M1 stenosis, right PCA distal stenosis  CTA head and neck - right ICA occlusion, right M1 high grade stenosis vs. Occlusion, right P2 occlusion and right V1 high grade stenosis,  CUS consistent with right ICA distal stenosis or occlusion  On ASA now  May consider DAPT on discharge if pt hematuria improves  Will consider cerebral angiogram to further assess right ICA and MCA occlusion/stenosis once more stabilized  Avoid hypotension  HTN / hypotension  Stable now  D/c'ed amlodipine  Resume TF and continue IVF  BP goal 130-150 due to right ICA occlusion  Avoid hypotension  HYPERLIPIDEMIA  Home Meds:  Pravachol 20 mg  LDL 46,  goal < 70  AST 183->74  ALT 72->22  Resumed pravachol 20  TOBACCO ABUSE  Current smoker  Smoking cessation counseling when applicable  Nicotine patch PRN  Other Active Problems:  Hypernatremia - resolved  Hyperglycemia - improved  Hypoxia -  Seen by CCM 10/13/2017 - pt to remain in unit for now (extubated 10/11/2017)  May not be ready for TEE Monday. Dr Elease HashimotoNahser saw pt 10/20/2017 - [facility]  Hospital day # 20    . Discussed with Dr.Ismail and critical care team NP   .Await family decision   on 1 way extubation  This patient is critically ill and at significant risk of neurological worsening, death and care requires constant monitoring of vital signs, hemodynamics,respiratory and cardiac monitoring, extensive review of multiple databases, frequent neurological assessment, discussion with family, other specialists and medical decision making of high complexity.I have made any additions or clarifications directly to the above note.This critical care time does not reflect procedure time, or teaching time or supervisory time of PA/NP/Med Resident etc but could involve care discussion time.  I spent 30 minutes of neurocritical care time  in the care of  this patient.   Stroke team will sign off kindly  call for questions.  Delia HeadyPramod Lacora Folmer, MD Medical Director [facility] Pager: [phone] 10/20/2017 1:50 PM     To contact Stroke Continuity provider, please refer to [email or web address]. After hours, contact General Neurology

## 2017-10-21 LAB — GLUCOSE, CAPILLARY
GLUCOSE-CAPILLARY: 126 mg/dL — AB (ref 65–99)
GLUCOSE-CAPILLARY: 139 mg/dL — AB (ref 65–99)
Glucose-Capillary: 138 mg/dL — ABNORMAL HIGH (ref 65–99)
Glucose-Capillary: 114 mg/dL — ABNORMAL HIGH (ref 65–99)
Glucose-Capillary: 147 mg/dL — ABNORMAL HIGH (ref 65–99)
Glucose-Capillary: 155 mg/dL — ABNORMAL HIGH (ref 65–99)

## 2017-10-21 LAB — PHOSPHORUS: Phosphorus: 3.9 mg/dL (ref 2.5–4.6)

## 2017-10-21 LAB — MAGNESIUM: Magnesium: 2 mg/dL (ref 1.7–2.4)

## 2017-10-21 MED ORDER — CHLORHEXIDINE GLUCONATE 0.12 % MT SOLN
OROMUCOSAL | Status: AC
  Administered 2017-10-21: 15 mL via OROMUCOSAL
  Filled 2017-10-21: qty 15

## 2017-10-21 NOTE — Progress Notes (Signed)
The patient's husband informed me before leaving tonight that he would like to be called in the morning before they go ahead with terminally extubating the pt.  He said he doesn't plan on being here for it, but he wants to be called before anything is done in the morning.  I will also inform the oncoming day shift RN of this request.

## 2017-10-21 NOTE — Progress Notes (Signed)
PULMONARY / CRITICAL CARE MEDICINE   Name: Ashlee Snow MRN: [id number] DOB: 03-Sep-1956    ADMISSION DATE:  10/18/2017 CONSULTATION DATE:  10/01/2017  REFERRING MD:  Pearlean BrownieSethi  CHIEF COMPLAINT:  Acute respiratory failure with hypoxemia  HISTORY OF PRESENT ILLNESS:   61 y/o female admitted on 12/13 with urosepsis in the setting of obstructive uropathy, ended up growing out MSSA from blood cultures and later developed R cerebellum infarct with associated hemorrhage and progressive worsening of her mental status.  She was intubated on 12/23 due to hypoxemic respiratory failure (second intubation this admission).     SUBJECTIVE:  Made DNR on 12/27  VITAL SIGNS: BP (!) 126/59   Pulse 79   Temp 99.7 F (37.6 C) (Axillary)   Resp (!) 25   Ht 5\' 4"  (1.626 m)   Wt 66.8 kg (147 lb 4.3 oz)   SpO2 100%   BMI 25.28 kg/m   HEMODYNAMICS:    VENTILATOR SETTINGS: Vent Mode: PRVC FiO2 (%):  [30 %] 30 % Set Rate:  [12 bmp] 12 bmp Vt Set:  [500 mL] 500 mL PEEP:  [5 cmH20] 5 cmH20 Plateau Pressure:  [17 cmH20-19 cmH20] 18 cmH20  INTAKE / OUTPUT: I/O last 3 completed shifts: In: 7160.7 [I.V.:3277.7; Other:150; WU/JW:1191G/GT:2833; IV Piggyback:900] Out: 4782 [NFAOZ:[id number] [Urine:4045; Stool:375]  PHYSICAL EXAMINATION:  General:  In bed on vent HENT: NCAT ETT in place PULM: CTA B, vent supported breathing CV: RRR, no mgr GI: BS+, soft, nontender MSK: normal bulk and tone Neuro: on vent, eyes open, not moving on my exam   LABS:  BMET Recent Labs  Lab 10/18/17 0256 10/19/17 1415 10/20/17 0416  NA 139 138 139  K 3.3* 3.6 4.0  CL 114* 106 107  CO2 17* 20* 21*  BUN 17 14 14   CREATININE 0.75 0.73 0.78  GLUCOSE 183* 175* 138*    Electrolytes Recent Labs  Lab 10/18/17 0256 10/19/17 1415 10/20/17 0416 10/21/17 0553  CALCIUM 8.0* 8.4* 8.2*  --   MG 2.0 1.9 1.9 2.0  PHOS  --  3.1 3.8 3.9    CBC Recent Labs  Lab 10/18/17 1210 10/19/17 1415 10/20/17 0416  WBC 7.1 8.7 6.9  HGB 9.0*  11.0* 9.9*  HCT 27.7* 34.3* 31.1*  PLT 363 424* 377    Coag's Recent Labs  Lab 10/18/17 0256  APTT 40*  INR 1.22    Sepsis Markers No results for input(s): LATICACIDVEN, PROCALCITON, O2SATVEN in the last 168 hours.  ABG Recent Labs  Lab 10/16/17 0412 10/18/17 0430 10/20/17 0348  PHART 7.414 7.483* 7.496*  PCO2ART 28.1* 23.3* 27.2*  PO2ART 144* 88.7 83.4    Liver Enzymes Recent Labs  Lab 10/18/17 0256  AST 29  ALT 25  ALKPHOS 78  BILITOT 1.0  ALBUMIN 1.9*    Cardiac Enzymes No results for input(s): TROPONINI, PROBNP in the last 168 hours.  Glucose Recent Labs  Lab 10/20/17 1133 10/20/17 1514 10/20/17 2038 10/20/17 2338 10/21/17 0331 10/21/17 0811  GLUCAP 163* 138* 163* 143* 138* 114*    Imaging No results found.   STUDIES:  Echo 12/09 >>LVEF 60-65%, mild MR;  Vascular US Upper 12/19 >> DVT distal R subclavian, axillary, brachial vein, superficial clot R cephalic anc basilic veins, negative for DVT on left Vas US lower 12/19 >> Acute DVT in peroneal vein, no clot on R    CULTURES: Respiratory culture 12/23 >> ngtd >> Blood cultures x2 12/23 >> ngtd >> Urine culture 12/23 >> Neg Blood cultures 12/18:  Negative Blood cultures 12/11: negative Urine culture 12/14: negative Stool C-diff 12/08: negative Blood cultures 12/08:  MSSA  ANTIBIOTICS: Nafcillin 12/18>>> Zosyn  12/12  >> 12/18 Vancomycin 12/08  >> 12/14  LINES/TUBES: ETT 12/14 to 12/19; 12/23 >> Left cortrak feeding tube 12/23 >> PIV x 2 Foley Flexiseal    DISCUSSION: 61 y/o female with recurrent acute respiratory failure with hypoxemia in the setting of a large R MCA stroke.    ASSESSMENT / PLAN:  PULMONARY A: Acute respiratory failure with hypoxemi P:   Family considering one way extubation Until then Full mechanical vent support VAP prevention Daily WUA/SBT   CARDIOVASCULAR A:  Acute CVA P:  Tele Per neurology  RENAL A:   No acute issues P:   Monitor  BMET and UOP Replace electrolytes as needed  GASTROINTESTINAL A:   Protein calorie malnutrition P:   Continue tube feeds, H2 blocker  HEMATOLOGIC A:   DVT P:  lovenox Monitor for bleeding  INFECTIOUS A:   Pyelonephritis, MSSA bacteremia HCAP P:   Continue nafcillin/probiotic  ENDOCRINE A:   Hyperglycemia  P:   SSI q4  NEUROLOGIC A:   CVA with acute encepalopathy, inability to protect airway without tracheostomy P:   RASS goal: 0 Minimize sedation   FAMILY  - Updates: family considering one way extubation  - Inter-disciplinary family meet or Palliative Care meeting due by:  day 7  My cc time 35 minutes  Heber CarolinaBrent McQuaid, MD [place] [facility] Pager: [phone] Cell: [phone] After 3pm or if no response, call [phone]   10/21/2017, 10:28 AM

## 2017-10-22 LAB — MAGNESIUM: Magnesium: 2.3 mg/dL (ref 1.7–2.4)

## 2017-10-22 LAB — GLUCOSE, CAPILLARY
GLUCOSE-CAPILLARY: 82 mg/dL (ref 65–99)
GLUCOSE-CAPILLARY: 110 mg/dL — AB (ref 65–99)
Glucose-Capillary: 121 mg/dL — ABNORMAL HIGH (ref 65–99)

## 2017-10-22 MED ORDER — LORAZEPAM 2 MG/ML IJ SOLN: 1.0000 mg | INTRAMUSCULAR | Status: DC | PRN

## 2017-10-22 MED ORDER — VITAL AF 1.2 CAL PO LIQD
1000.0000 mL | ORAL | Status: DC
  Administered 2017-10-22 – 2017-10-24 (×4): 1000 mL
  Filled 2017-10-22 (×4): qty 1000

## 2017-10-22 MED ORDER — GLYCOPYRROLATE 0.2 MG/ML IJ SOLN
0.4000 mg | Freq: Four times a day (QID) | INTRAMUSCULAR | Status: DC
  Administered 2017-10-22 – 2017-10-24 (×10): 0.4 mg via INTRAVENOUS
  Filled 2017-10-22 (×10): qty 2

## 2017-10-22 MED ORDER — MORPHINE SULFATE 25 MG/ML IV SOLN
2.0000 mg/h | INTRAVENOUS | Status: DC
  Administered 2017-10-22 – 2017-10-25 (×2): 2 mg/h via INTRAVENOUS
  Filled 2017-10-22 (×2): qty 10

## 2017-10-22 MED ORDER — MORPHINE BOLUS VIA INFUSION
2.0000 mg | INTRAVENOUS | Status: DC | PRN
  Filled 2017-10-22: qty 2

## 2017-10-22 NOTE — Procedures (Signed)
Extubation Procedure Note  Patient Details:   Name: Ashlee Snow DOB: 28-Feb-1956 MRN: [id number]   Airway Documentation:   Patient extubated per orders for withdrawal of life support. Patient looks comfortable with family, NP,  and chaplain at bedside.  Evaluation  O2 sats: stable throughout Complications: No apparent complications Patient did tolerate procedure well. Bilateral Breath Sounds: Diminished(coarse)   No  Suszanne ConnersLaura P Lavontae Cornia 10/22/2017, 1:30 PM

## 2017-10-22 NOTE — Progress Notes (Signed)
ANTICOAGULATION CONSULT NOTE  Pharmacy Consult for Lovenox Indication: DVT  Allergies  Allergen Reactions  . Ativan [Lorazepam] Other (See Comments)    Patient's husband said related to being oversedated not true allergy    Patient Measurements: Height: 5\' 4"  (162.6 cm) Weight: 151 lb 7.3 oz (68.7 kg) IBW/kg (Calculated) : 54.7 Heparin Dosing Weight: 63.9 kg  Vital Signs: Temp: 98.2 F (36.8 C) (12/30 0800) Temp Source: Axillary (12/30 0800) BP: 128/64 (12/30 0908) Pulse Rate: 71 (12/30 0908)  Labs: Recent Labs    10/19/17 1415 10/20/17 0416  HGB 11.0* 9.9*  HCT 34.3* 31.1*  PLT 424* 377  CREATININE 0.73 0.78    Estimated Creatinine Clearance: 72.1 mL/min (by C-G formula based on SCr of 0.78 mg/dL).   Medical History: Past Medical History:  Diagnosis Date  . Asthma   . Bronchitis   . DDD (degenerative disc disease)   . Depression   . Dyspnea    on exertion  . Fibromyalgia   . GERD (gastroesophageal reflux disease)    hx. of   . History of depression   . Hypertension   . Hypothyroidism   . Leg pain   . Migraine   . Peripheral arterial disease (HCC)   . Peripheral vascular disease (HCC)   . Sciatica   . Stroke (cerebrum) (HCC) 10/13/2017  . Thyroid disease   . 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 Ulcer     Assessment: 61 yo female s/p R MCA and cerebellar stroke around 12/8 with mass effect and currently being treated for mssa bacteremia. Found to have L lower extremity DVT. Patient was initially started on heparin but was transitioned to Lovenox due to IV issues. hgb has been stable with last 9.9 on 12/28. No signs of bleeding noted. F/U GOC today.   Goal of Therapy:  Anti-Xa 0.6-1 units/mL Monitor platelets by anticoagulation protocol: Yes    Plan:  -Lovenox 65 mg [place] q12h -Monitor platelets and s/sx of bleeding    Sharin MonsEmily Gracelin Weisberg, PharmD, BCPS PGY2 Infectious Diseases Pharmacy Resident Phone: X  [phone]  10/22/2017 10:13 AM

## 2017-10-22 NOTE — Progress Notes (Addendum)
   10/22/17 1900  Clinical Encounter Type  Visited With Patient and family together  Visit Type Initial  Referral From Nurse  Consult/Referral To Chaplain;Nurse  Spiritual Encounters  Spiritual Needs Emotional  Stress Factors  Patient Stress Factors Exhausted;Health changes  Family Stress Factors Exhausted    This was a page from a charge nurse for a 61 year old female caucasian who suffered stroke. Family of husband and son on-site in emotional distress. Medical staff extubated patient who went on to breathe by herself. Husband though in emotional distress was able to share about his love for patient and their love story.I provided reflective listening, emotional support, compassionate presence and prayer. Patient and family will need more spiritual support.  Takeshia Wenk a Water quality scientistMusiko-Holley, E. I. du PontChaplain

## 2017-10-22 NOTE — Progress Notes (Signed)
PULMONARY / CRITICAL CARE MEDICINE   Name: Ashlee Snow MRN: [id number] DOB: 05-30-1956    ADMISSION DATE:  10/01/2017 CONSULTATION DATE:  10/02/2017  REFERRING MD:  Pearlean BrownieSethi  CHIEF COMPLAINT:  Acute respiratory failure with hypoxemia  HISTORY OF PRESENT ILLNESS:   61 y/o female admitted on 12/13 with urosepsis in the setting of obstructive uropathy, ended up growing out MSSA from blood cultures and later developed R cerebellum infarct with associated hemorrhage and progressive worsening of her mental status.  She was intubated on 12/23 due to hypoxemic respiratory failure (second intubation this admission).     SUBJECTIVE:  No acute events  VITAL SIGNS: BP 128/64   Pulse 71   Temp 98.2 F (36.8 C) (Axillary)   Resp (!) 25   Ht 5\' 4"  (1.626 m)   Wt 68.7 kg (151 lb 7.3 oz)   SpO2 100%   BMI 26.00 kg/m   HEMODYNAMICS:    VENTILATOR SETTINGS: Vent Mode: PRVC FiO2 (%):  [30 %] 30 % Set Rate:  [12 bmp] 12 bmp Vt Set:  [500 mL] 500 mL PEEP:  [5 cmH20] 5 cmH20 Plateau Pressure:  [15 cmH20-21 cmH20] 18 cmH20  INTAKE / OUTPUT: I/O last 3 completed shifts: In: 6902.5 [I.V.:3394.5; NG/GT:2508; IV Piggyback:1000] Out: 3400 [Urine:3400]  PHYSICAL EXAMINATION:  General:  In bed on vent HENT: NCAT ETT in place PULM: CTA B, vent supported breathing CV: RRR, no mgr GI: BS+, soft, nontender MSK: normal bulk and tone Neuro: rightward gaze, some flex to pain left leg    LABS:  BMET Recent Labs  Lab 10/18/17 0256 10/19/17 1415 10/20/17 0416  NA 139 138 139  K 3.3* 3.6 4.0  CL 114* 106 107  CO2 17* 20* 21*  BUN 17 14 14   CREATININE 0.75 0.73 0.78  GLUCOSE 183* 175* 138*    Electrolytes Recent Labs  Lab 10/18/17 0256 10/19/17 1415 10/20/17 0416 10/21/17 0553 10/22/17 0457  CALCIUM 8.0* 8.4* 8.2*  --   --   MG 2.0 1.9 1.9 2.0 2.3  PHOS  --  3.1 3.8 3.9  --     CBC Recent Labs  Lab 10/18/17 1210 10/19/17 1415 10/20/17 0416  WBC 7.1 8.7 6.9  HGB  9.0* 11.0* 9.9*  HCT 27.7* 34.3* 31.1*  PLT 363 424* 377    Coag's Recent Labs  Lab 10/18/17 0256  APTT 40*  INR 1.22    Sepsis Markers No results for input(s): LATICACIDVEN, PROCALCITON, O2SATVEN in the last 168 hours.  ABG Recent Labs  Lab 10/16/17 0412 10/18/17 0430 10/20/17 0348  PHART 7.414 7.483* 7.496*  PCO2ART 28.1* 23.3* 27.2*  PO2ART 144* 88.7 83.4    Liver Enzymes Recent Labs  Lab 10/18/17 0256  AST 29  ALT 25  ALKPHOS 78  BILITOT 1.0  ALBUMIN 1.9*    Cardiac Enzymes No results for input(s): TROPONINI, PROBNP in the last 168 hours.  Glucose Recent Labs  Lab 10/21/17 1118 10/21/17 1635 10/21/17 2154 10/21/17 2356 10/22/17 0422 10/22/17 0801  GLUCAP 147* 155* 139* 126* 82 110*    Imaging No results found.   STUDIES:  Echo 12/09 >>LVEF 60-65%, mild MR;  Vascular US Upper 12/19 >> DVT distal R subclavian, axillary, brachial vein, superficial clot R cephalic anc basilic veins, negative for DVT on left Vas US lower 12/19 >> Acute DVT in peroneal vein, no clot on R    CULTURES: Respiratory culture 12/23 >> ngtd >> Blood cultures x2 12/23 >> ngtd >> Urine culture 12/23 >>  Neg Blood cultures 12/18: Negative Blood cultures 12/11: negative Urine culture 12/14: negative Stool C-diff 12/08: negative Blood cultures 12/08:  MSSA  ANTIBIOTICS: Nafcillin 12/18>>> Zosyn  12/12  >> 12/18 Vancomycin 12/08  >> 12/14  LINES/TUBES: ETT 12/14 to 12/19; 12/23 >> Left cortrak feeding tube 12/23 >> PIV x 2 Foley Flexiseal    DISCUSSION: 61 y/o female with recurrent acute respiratory failure with hypoxemia in the setting of a large R MCA stroke.    ASSESSMENT / PLAN:  PULMONARY A: Acute respiratory failure with hypoxemi P:   Likely one way extubation today> per palliative care Full mechanical vent support VAP prevention Daily WUA/SBT  CARDIOVASCULAR A:  Acute CVA P:  tele  RENAL A:   No acute issues P:   Monitor BMET and  UOP Replace electrolytes as needed   GASTROINTESTINAL A:   Protein calorie malnutrition P:   Continue tube feeds, H2 blocker  HEMATOLOGIC A:   DVT P:  lovenox montior for bleeding  INFECTIOUS A:   Pyelonephritis, MSSA bacteremia HCAP P:   Continue nafcillin, probiotic  ENDOCRINE A:   Hyperglycemia  P:   SSI  NEUROLOGIC A:   CVA with acute encepalopathy, inability to protect airway without tracheostomy P:   RASS goal: 0 Minimize sedation  FAMILY  - Updates: family considering one way extubation, discussing with palliative medicine today  - Inter-disciplinary family meet or Palliative Care meeting due by:  day 7   Heber CarolinaBrent Lashannon Bresnan, MD [place] [facility] Pager: [phone] Cell: [phone] After 3pm or if no response, call [phone]   10/22/2017, 10:34 AM

## 2017-10-22 NOTE — Progress Notes (Signed)
Patient ID: Ashlee Snow, female   DOB: 03-Nov-1955, 61 y.o.   MRN: [id number]  This NP visited patient at the bedside as a follow up to emotional support and continued conversation regarding diagnosis, prognosis, goals of care and viable treatment options.  Husband and son Ashlee Snow at bedside.  As discussed previously husband agrees with liberation from the ventilator today.  He speaks to help difficult this decision is for him however he "knows this is best for Ashlee Snow".  He tells me that he and his wife had conversations regarding desire for comfort if a situation like the current medical situation should arise  for either one of them.  Detailed what a shift to a comfort approach will look like.  His main focus is that his wife does not suffer. - one way extubation, do not re-intubate - minimize medication, utilize only for comfort      -initiate morphine gtt, wean  propofol  Off       -ativan ( husband reports no true allergy) - husband request continuation of TF, will decrease rate to trickle/10 cc - dc labs and all daignostics  I reinforced with family that we do not know how long she will survive when she is weaned from the ventilator.  We will take this 1 step at a time and make appropriate transitions of care as needed.  Evaluate in the morning for transition to a hospice facility.  [place] in [place]  Discussed possibility of a hospice facility closer to home if that makes sense over the next hours to days.  Continue to evaluate for transiton of care  Questions and concerns addressed  Time in   1330        Time out    1445  Total time spent on the unit was 75 min  Greater than 50% of the time was spent in counseling and coordination of care  Lorinda CreedMary Climmie Buelow NP  Palliative Medicine Team Team Phone # [phone] Pager [phone]

## 2017-10-23 DIAGNOSIS — R0609 Other forms of dyspnea: Secondary | ICD-10-CM

## 2017-10-23 MED ORDER — ACETAMINOPHEN 160 MG/5ML PO SOLN: 650.0000 mg | Freq: Four times a day (QID) | ORAL | Status: DC | PRN

## 2017-10-23 NOTE — Progress Notes (Signed)
Patient ID: Ashlee Snow, female   DOB: 1955/12/19, 61 y.o.   MRN: [id number]  This NP visited patient at the bedside as a follow up to for palliative needs and emotional support.  Focus of care is comfort and dignity.  Patient is unresponsive but appears comfortable, morphine drip continues. Patient is mottled at the knees, skin is cool to touch  Placed call to patient's husband and discussed the natural trajectory and expectations at end of life.   Yesterday patient's husband had expressed an interest in possibly moving her to a hospice facility closer to home however today he feels that the patient will be better served anticipating hospital death.  I agree, prognosis is likely hours to days.  Pain continues to verbalize family's great struggle anticipating the death of Ms. Ashlee Snow.  Emotional support offered.  Encouraged family to utilize grief and bereavement after death  Questions and concerns addressed  Time in 0740     Time out   0815 total time spent on the unit was 35 minutes  Discussed with nursing and Dr Kendrick FriesMcQuaid  Greater than 50% of the time was spent in counseling and coordination of care  Lorinda CreedMary Ashlan Dignan NP  Palliative Medicine Team Team Phone # [phone] Pager [phone]

## 2017-10-23 NOTE — Progress Notes (Signed)
Pt arrived via bed from 4N25  ICU, palliative care level of care/comfort care. Pt currently on morphine drip, at 5mg /hr. Pt eyes open, not tracking at this time, respirations 20-40 varies some apnea between breaths. Spontaneous moves R arm around, reported from 4N RN that pt has pulled at lines, therefore pt came with a mitten on R hand to prevent pulling IV or feeding tube lines out.  Tube feeding-cortrak tube in R nare with vital tube feeds at 10cc/hr.   Pt resting without distress at this time, HOB elevated at approx 32 degrees. Foley cath and flexiseal in place for comfort from incontinece of urine and liquid stooling.  No family present at time of arrival.

## 2017-10-23 NOTE — Progress Notes (Signed)
Family---son, daughter just arrived to room.

## 2017-10-23 NOTE — Progress Notes (Signed)
PULMONARY / CRITICAL CARE MEDICINE   Name: Ashlee Snow MRN: [id number] DOB: Jul 07, 1956    ADMISSION DATE:  10/22/2017 CONSULTATION DATE:  10/17/2017  REFERRING MD:  Pearlean BrownieSethi  CHIEF COMPLAINT:  Acute respiratory failure with hypoxemia  HISTORY OF PRESENT ILLNESS:   61 y/o female admitted on 12/13 with urosepsis in the setting of obstructive uropathy, ended up growing out MSSA from blood cultures and later developed R cerebellum infarct with associated hemorrhage and progressive worsening of her mental status.  She was intubated on 12/23 due to hypoxemic respiratory failure (second intubation this admission).     SUBJECTIVE:  Extubated yesterday, with plans to transfer to palliative care today.  VITAL SIGNS: BP 134/69   Pulse (!) 105   Temp 99.8 F (37.7 C) (Axillary)   Resp 16   Ht 5\' 4"  (1.626 m)   Wt 151 lb 7.3 oz (68.7 kg)   SpO2 93%   BMI 26.00 kg/m   HEMODYNAMICS:    VENTILATOR SETTINGS: Vent Mode: PRVC FiO2 (%):  [30 %] 30 % Set Rate:  [12 bmp] 12 bmp Vt Set:  [500 mL] 500 mL PEEP:  [5 cmH20] 5 cmH20 Plateau Pressure:  [21 cmH20] 21 cmH20  INTAKE / OUTPUT: I/O last 3 completed shifts: In: 3335.2 [I.V.:1893.7; NG/GT:1141.5; IV Piggyback:300] Out: 3535 [Urine:2835; Stool:700]  PHYSICAL EXAMINATION:  General: Extubated and in no acute distress.  He does mumble some spontaneously but does not provide appropriate responses to questions. HENT: NCAT ETT in place PULM: Respirations are unlabored, there is some scattered rhonchi, no wheezes.   CV: RRR, no mgr GI: BS+, soft, nontender MSK: normal bulk and tone Neuro: Incomprehensible spontaneous speech.  Spontaneous movement of the right but not left upper extremity.  Slight anisocoria with the right pupil being 2 mm in the left being 1.  LABS:  BMET Recent Labs  Lab 10/18/17 0256 10/19/17 1415 10/20/17 0416  NA 139 138 139  K 3.3* 3.6 4.0  CL 114* 106 107  CO2 17* 20* 21*  BUN 17 14 14   CREATININE 0.75  0.73 0.78  GLUCOSE 183* 175* 138*    Electrolytes Recent Labs  Lab 10/18/17 0256 10/19/17 1415 10/20/17 0416 10/21/17 0553 10/22/17 0457  CALCIUM 8.0* 8.4* 8.2*  --   --   MG 2.0 1.9 1.9 2.0 2.3  PHOS  --  3.1 3.8 3.9  --     CBC Recent Labs  Lab 10/18/17 1210 10/19/17 1415 10/20/17 0416  WBC 7.1 8.7 6.9  HGB 9.0* 11.0* 9.9*  HCT 27.7* 34.3* 31.1*  PLT 363 424* 377    Coag's Recent Labs  Lab 10/18/17 0256  APTT 40*  INR 1.22    Sepsis Markers No results for input(s): LATICACIDVEN, PROCALCITON, O2SATVEN in the last 168 hours.  ABG Recent Labs  Lab 10/18/17 0430 10/20/17 0348  PHART 7.483* 7.496*  PCO2ART 23.3* 27.2*  PO2ART 88.7 83.4    Liver Enzymes Recent Labs  Lab 10/18/17 0256  AST 29  ALT 25  ALKPHOS 78  BILITOT 1.0  ALBUMIN 1.9*    Cardiac Enzymes No results for input(s): TROPONINI, PROBNP in the last 168 hours.  Glucose Recent Labs  Lab 10/21/17 1635 10/21/17 2154 10/21/17 2356 10/22/17 0422 10/22/17 0801 10/22/17 1222  GLUCAP 155* 139* 126* 82 110* 121*    Imaging No results found.   STUDIES:  Echo 12/09 >>LVEF 60-65%, mild MR;  Vascular US Upper 12/19 >> DVT distal R subclavian, axillary, brachial vein, superficial clot R  cephalic anc basilic veins, negative for DVT on left Vas US lower 12/19 >> Acute DVT in peroneal vein, no clot on R    CULTURES: Respiratory culture 12/23 >> ngtd >> Blood cultures x2 12/23 >> ngtd >> Urine culture 12/23 >> Neg Blood cultures 12/18: Negative Blood cultures 12/11: negative Urine culture 12/14: negative Stool C-diff 12/08: negative Blood cultures 12/08:  MSSA  ANTIBIOTICS: Nafcillin 12/18>>> Zosyn  12/12  >> 12/18 Vancomycin 12/08  >> 12/14  LINES/TUBES: ETT 12/14 to 12/19; 12/23 >> Left cortrak feeding tube 12/23 >> PIV x 2 Foley Flexiseal    DISCUSSION: 61 y/o female with recurrent acute respiratory failure with hypoxemia in the setting of a large R MCA stroke.     ASSESSMENT / PLAN:  PULMONARY A: Acute respiratory failure with hypoxemi P:   Likely one way extubation today> per palliative care Full mechanical vent support VAP prevention Daily WUA/SBT  CARDIOVASCULAR A:  Acute CVA P:  tele  RENAL A:   No acute issues P:   Monitor BMET and UOP Replace electrolytes as needed   GASTROINTESTINAL A:   Protein calorie malnutrition P:   Continue tube feeds, H2 blocker  HEMATOLOGIC A:   DVT P:  lovenox montior for bleeding  INFECTIOUS A:   Pyelonephritis, MSSA bacteremia HCAP P:   Continue nafcillin, probiotic  ENDOCRINE A:   Hyperglycemia  P:   SSI  NEUROLOGIC A:   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   A neurological outcome acceptable to the patient is not anticipated and at family request the patient has been made a comfort measures only.  She will be transferred to the palliative care service today, critical care will not routinely follow please reconsult us if needed.     Penny PiaWJ Phebe Dettmer, MD [place] [facility] Pager: [phone] [phone] After 3pm or if no response, call [phone]   10/23/2017, 11:06 AM

## 2017-10-23 NOTE — Progress Notes (Signed)
eLink Physician-Brief Progress Note Patient Name: Ashlee Snow DOB: 1956-08-15 MRN: [id number]   Date of Service  10/23/2017  HPI/Events of Note  Request to change Tylenol Suppository to Tylenol liquid.   eICU Interventions  Will order: 1. D/C Tylenol Suppository. 2. Tylenol liquid 650 mg per tube Q 6 hours PRN Temp > 101.5 F.     Intervention Category Major Interventions: Other:  Lenell AntuSommer,Sible Straley Eugene 10/23/2017, 12:18 AM

## 2017-10-24 NOTE — Progress Notes (Signed)
PULMONARY / CRITICAL CARE MEDICINE   Name: Ashlee Snow MRN: [id number] DOB: 1956/04/09    ADMISSION DATE:  10/12/2017 CONSULTATION DATE:  10/22/2017  REFERRING MD:  Pearlean Brownie  CHIEF COMPLAINT:  Acute respiratory failure with hypoxemia  HISTORY OF PRESENT ILLNESS:   62 y/o female admitted on 12/13 with urosepsis in the setting of obstructive uropathy, ended up growing out MSSA from blood cultures and later developed R cerebellum infarct with associated hemorrhage and progressive worsening of her mental status.  She was intubated on 12/23 due to hypoxemic respiratory failure (second intubation this admission).     STUDIES:  Echo 12/09 >>LVEF 60-65%, mild MR;  Vascular US Upper 12/19 >> DVT distal R subclavian, axillary, brachial vein, superficial clot R cephalic anc basilic veins, negative for DVT on left Vas [place] lower 12/19 >> Acute DVT in peroneal vein, no clot on R    CULTURES: Respiratory culture 12/23 >> ngtd >> Blood cultures x2 12/23 >> ngtd >> Urine culture 12/23 >> Neg Blood cultures 12/18: Negative Blood cultures 12/11: negative Urine culture 12/14: negative Stool C-diff 12/08: negative Blood cultures 12/08:  MSSA  ANTIBIOTICS: Nafcillin 12/18>>> Zosyn  12/12  >> 12/18 Vancomycin 12/08  >> 12/14   LINES/TUBES: ETT 12/14 to 12/19; 12/23 >>12/30 Left cortrak feeding tube 12/23 >> PIV x 2 Foley Flexiseal   Events 12/31 - extubated yesterday, with plans to transfer to palliative care today.   SUBJECTIVE/OVERNIGHT/INTERVAL HX  1/1 - onmorphing gtt. RN denies death rattle. Calm and deeply sedated and peaceful perRN. No family at bedside. Per RN - death anticipated possibly today per palliative nruse who visited patient earlier today  VITAL SIGNS: BP (!) 119/49 (BP Location: Left Arm)   Pulse (!) 103   Temp (!) 103.1 F (39.5 C) (Axillary)   Resp 10   Ht 5\' 4"  (1.626 m)   Wt 68.7 kg (151 lb 7.3 oz)   SpO2 (!) 17%   BMI 26.00 kg/m   HEMODYNAMICS:     VENTILATOR SETTINGS:    INTAKE / OUTPUT: I/O last 3 completed shifts: In: 307.6 [I.V.:67.6; NG/GT:240] Out: 2990 [Urine:1790; Stool:1200]  PHYSICAL EXAMINATION: On morphine gtt ? Mild death rattle RASS -5 equvailent Looks comfortable Mild facial feature relaxation   LABS:  BMET Recent Labs  Lab 10/18/17 0256 10/19/17 1415 10/20/17 0416  NA 139 138 139  K 3.3* 3.6 4.0  CL 114* 106 107  CO2 17* 20* 21*  BUN 17 14 14   CREATININE 0.75 0.73 0.78  GLUCOSE 183* 175* 138*    Electrolytes Recent Labs  Lab 10/18/17 0256 10/19/17 1415 10/20/17 0416 10/21/17 0553 10/22/17 0457  CALCIUM 8.0* 8.4* 8.2*  --   --   MG 2.0 1.9 1.9 2.0 2.3  PHOS  --  3.1 3.8 3.9  --     CBC Recent Labs  Lab 10/18/17 1210 10/19/17 1415 10/20/17 0416  WBC 7.1 8.7 6.9  HGB 9.0* 11.0* 9.9*  HCT 27.7* 34.3* 31.1*  PLT 363 424* 377    Coag's Recent Labs  Lab 10/18/17 0256  APTT 40*  INR 1.22    Sepsis Markers No results for input(s): LATICACIDVEN, PROCALCITON, O2SATVEN in the last 168 hours.  ABG Recent Labs  Lab 10/18/17 0430 10/20/17 0348  PHART 7.483* 7.496*  PCO2ART 23.3* 27.2*  PO2ART 88.7 83.4    Liver Enzymes Recent Labs  Lab 10/18/17 0256  AST 29  ALT 25  ALKPHOS 78  BILITOT 1.0  ALBUMIN 1.9*    Cardiac  Enzymes No results for input(s): TROPONINI, PROBNP in the last 168 hours.  Glucose Recent Labs  Lab 10/21/17 1635 10/21/17 2154 10/21/17 2356 10/22/17 0422 10/22/17 0801 10/22/17 1222  GLUCAP 155* 139* 126* 82 110* 121*    Imaging No results found.   DISCUSSION: 62 y/o female with recurrent acute respiratory failure with hypoxemia in the setting of a large R MCA stroke.    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     TERMINAL CARE DNR Comfort care  Continue morphine gtt and other comfort measure Prognosis - hours to days   Dr. Kalman ShanMurali Faithe Ariola, M.D., [facility]C.P Pulmonary and Critical Care Medicine Staff Physician, [facility] Center Director - Interstitial Lung Disease  Program  Pulmonary Fibrosis [facility] [place], [place], [id number]  Pager: [phone], If no answer or between  15:00h - 7:00h: call [phone] Telephone: [phone]

## 2017-10-24 NOTE — Progress Notes (Signed)
Patient ID: Ashlee Snow, female   DOB: 07/20/1956, 62 y.o.   MRN: [id number]  This NP visited patient at the bedside as a follow up to for palliative needs and emotional support.  Focus of care is comfort and dignity.  Placed call to husband, emotional support offered as he speaks to his sadness and struggles with the impeding death of his wife.  Patient is unresponsive but appears comfortable, morphine drip continues. Patient is mottled at the knees, skin is cool to touch  Discussed the natural trajectory and expectations at end of life. Prognosis is likely hours to days.  Questions and concerns addressed  Time in 1100    Time out  1120   total time spent on the unit was 20 minutes  Discussed with nursing   Greater than 50% of the time was spent in counseling and coordination of care  Lorinda CreedMary Atha Muradyan NP  Palliative Medicine Team Team Phone # [phone] Pager [phone]

## 2017-10-24 DEATH — deceased

## 2017-11-02 ENCOUNTER — Telehealth: Payer: Self-pay

## 2017-11-02 NOTE — Telephone Encounter (Signed)
On 11/02/17 I received a d/c from [facility] (original[facility] The d/c is for burial. The patient is a patient of Doctor Ramaswamy. The d/c will be taken to Pulmonary Unit @ [facility] for signature.  On 11/06/17 I received the d/c back from Doctor Ramaswamy. I got the d/c ready and called the funeral home to let them know I mailed the d/c to vital records per the funeral home request.

## 2017-11-24 NOTE — Progress Notes (Signed)
Nutrition Brief Note  Chart reviewed. Pt now transitioning to comfort care.  No further nutrition interventions warranted at this time.  Please re-consult as needed.   Fawna Cranmer A. Yudith Norlander, RD, LDN, CDE Pager: [phone] After hours Pager: [phone]  

## 2017-11-24 NOTE — Discharge Summary (Signed)
DISCHARGE SUMMARY    Date of admit: 10/05/2017  7:29 PM Date of discharge: 11/13/2017  1:00 AM Length of Stay: 25 days  PCP is Kirstie PeriShah, Ashish, MD   PROBLEM LIST Principal Problem:   MSSA bacteremia Active Problems:   Sepsis (HCC)   Stroke (cerebrum) (HCC)   AKI (acute kidney injury) (HCC)   Obstructive pyelonephritis   Thrombocytopenia (HCC)   Hypokalemia   Lactic acidosis   Encephalopathy acute   Aspiration into airway   Fever   Dyspnea   Respirator dependent (HCC)   Staphylococcus aureus bacteremia with sepsis (HCC)   Deep venous thrombosis (HCC)   Acute respiratory failure (HCC)   Palliative care by specialist   DNR (do not resuscitate) discussion   Ventilator dependent (HCC)    SUMMARY Ashlee Snow was 62 y.o. patient with    has a past medical history of Asthma, Bronchitis, DDD (degenerative disc disease), Depression, Dyspnea, Fibromyalgia, GERD (gastroesophageal reflux disease), History of depression, Hypertension, Hypothyroidism, Leg pain, Migraine, Peripheral arterial disease (HCC), Peripheral vascular disease (HCC), Sciatica, Stroke (cerebrum) (HCC) (10/08/2017), Thyroid disease, TMJ (temporomandibular joint disorder) (08/24/1991), and Ulcer.   has a past surgical history that includes Cholecystectomy (01/22/1997); Abdominal hysterectomy; Ovarian cyst surgery; aortogram w/ stenting (2007, 2011, Aug. 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   Admitted on 10/20/2017 with  62 y/o female admitted on 12/13 with urosepsis in the setting of obstructive uropathy, ended up growing out MSSA from blood cultures and later developed R cerebellum infarct with associated hemorrhage and progressive worsening of her mental status.  She was intubated on 12/23 due to hypoxemic respiratory failure  (second intubation this admission).  Over course of her admission she declined and then on 10/23/2017 was extubated and transferred to palliative care unit for terminal care. I saw her on 10/24/17 and she was comfortable. She later expired 10/24/2017         SIGNED Dr. Kalman ShanMurali Bryonna Sundby, M.D., [facility].C.P Pulmonary and Critical Care Medicine Staff Physician [facility] [place] Pulmonary and Critical Care Pager: [phone], If no answer or between  15:00h - 7:00h: call [phone]  11/08/2017 3:14 PM

## 2017-11-24 DEATH — deceased

## 2018-03-12 ENCOUNTER — Ambulatory Visit: Payer: BLUE CROSS/BLUE SHIELD | Admitting: Surgery

## 2018-03-12 ENCOUNTER — Encounter (HOSPITAL_COMMUNITY): Payer: BLUE CROSS/BLUE SHIELD

## 2018-12-04 IMAGING — CT CT HEAD W/O CM
4 series · 16 of 47 positions shown, 18 images · non-contrast
Comparison: 09/30/2017 and multiple previous

CLINICAL DATA: Acute mental status change with lethargy.

EXAM:
CT HEAD WITHOUT CONTRAST
TECHNIQUE: Contiguous axial images were obtained from the base of the skull
through the vertex without intravenous contrast.

[Series 3: head wo · axial · 0.43mm/px · z∈[-102,+14]mm · 7 of 31 slices shown, 9 images]
[im 4/31  brain]
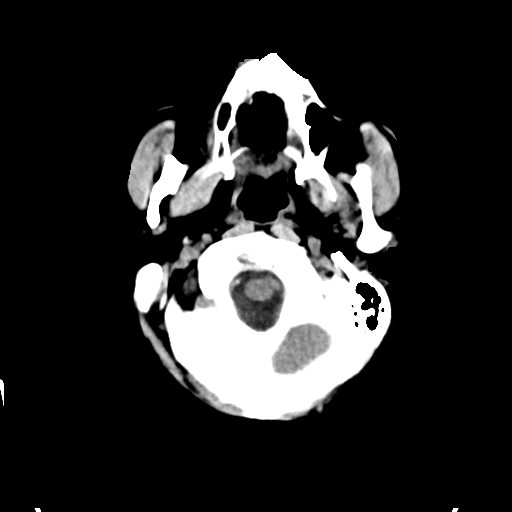
[im 4/31  bone]
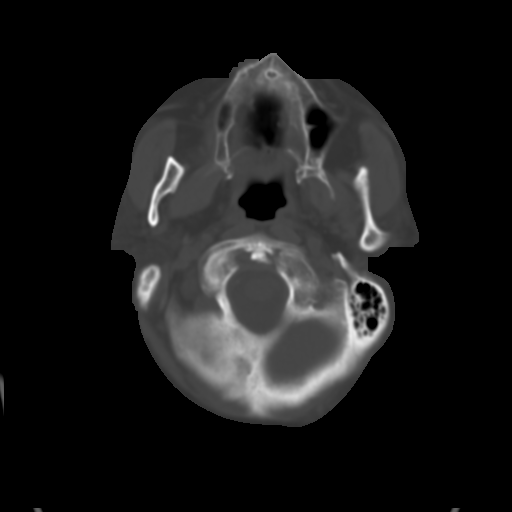
[im 8/31  brain]
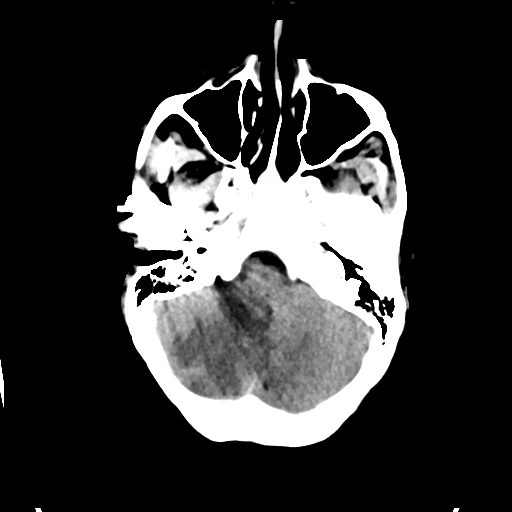
[im 12/31  brain]
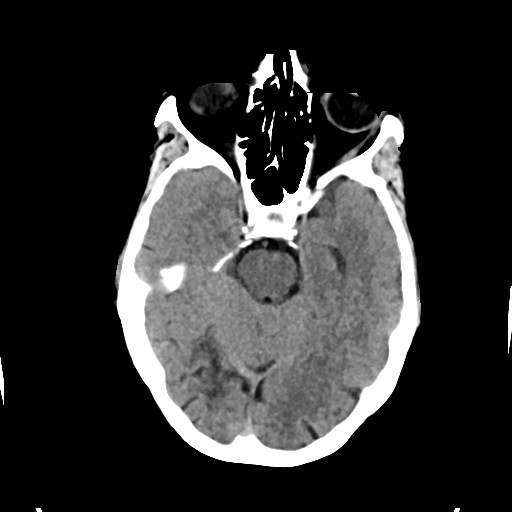
[im 16/31  brain]
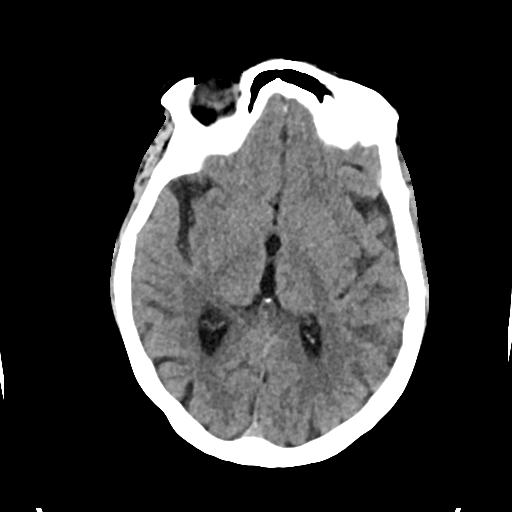
[im 19/31  brain]
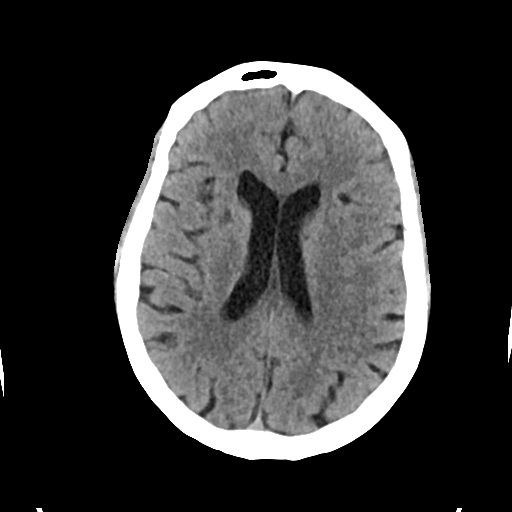
[im 19/31  bone]
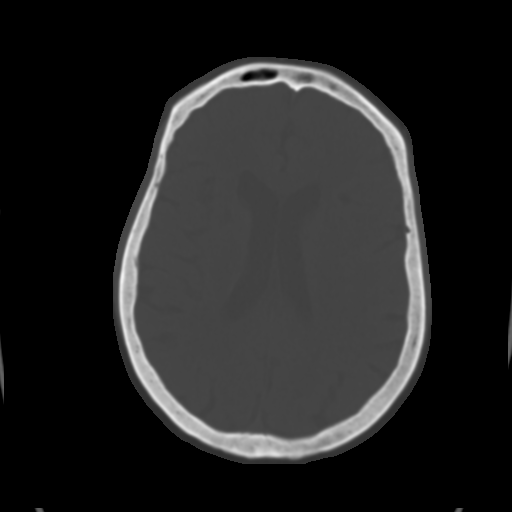
[im 23/31  brain]
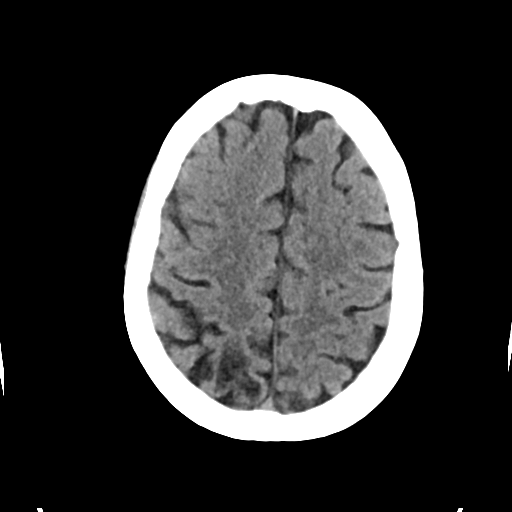
[im 27/31  brain]
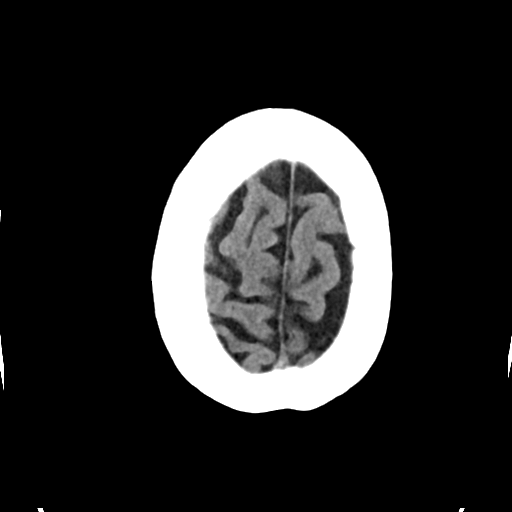

[Series 4: head bone · axial · 0.43mm/px · z∈[-102,-72]mm · 3 of 77 slices shown]
[im 8/77  bone]
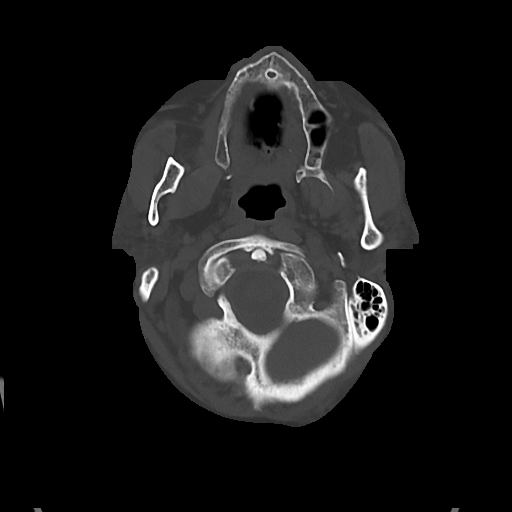
[im 16/77  bone]
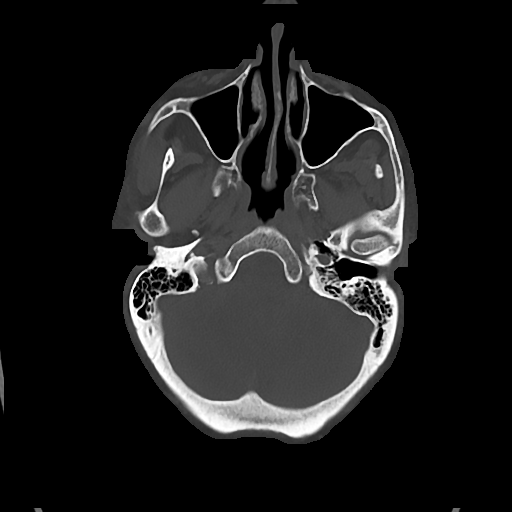
[im 23/77  bone]
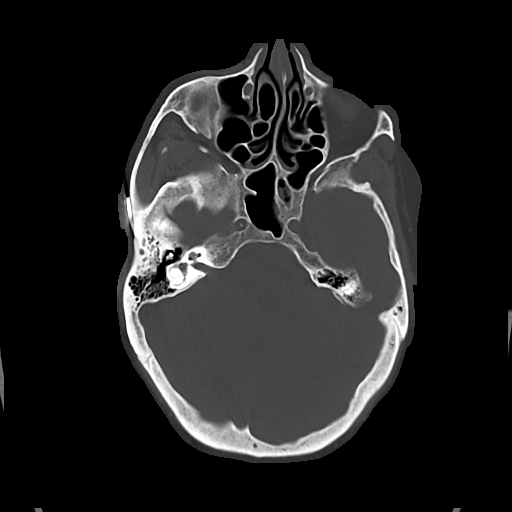

[Series 5: cor soft · coronal · 0.33mm/px · 3 of 59 slices shown]
[im 20/59  brain]
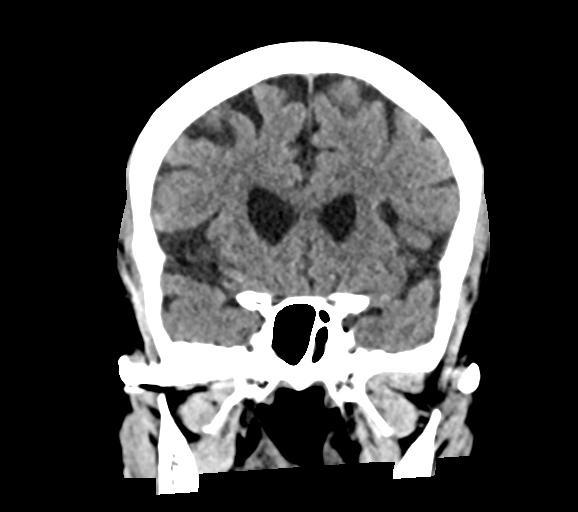
[im 26/59  brain]
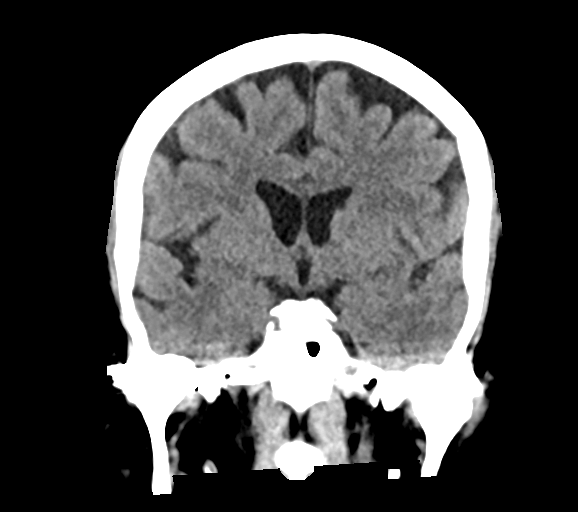
[im 33/59  brain]
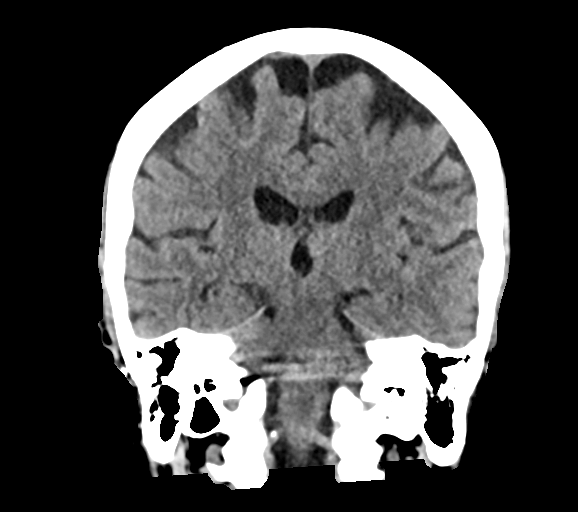

[Series 6: sag soft · sagittal · 0.32mm/px · 3 of 47 slices shown]
[im 16/47  brain]
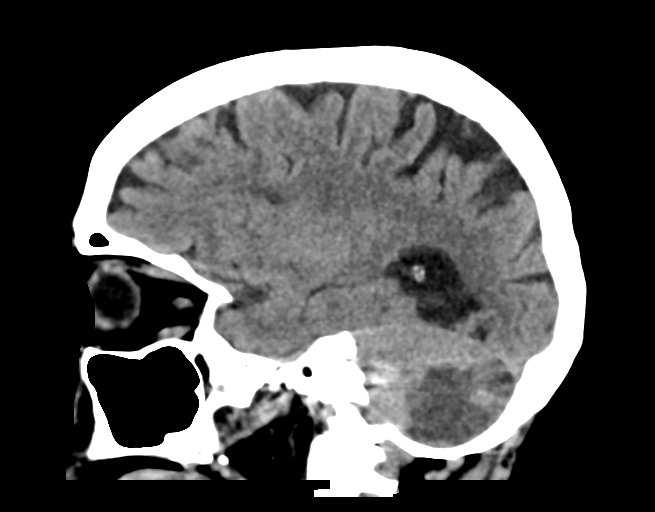
[im 24/47  brain]
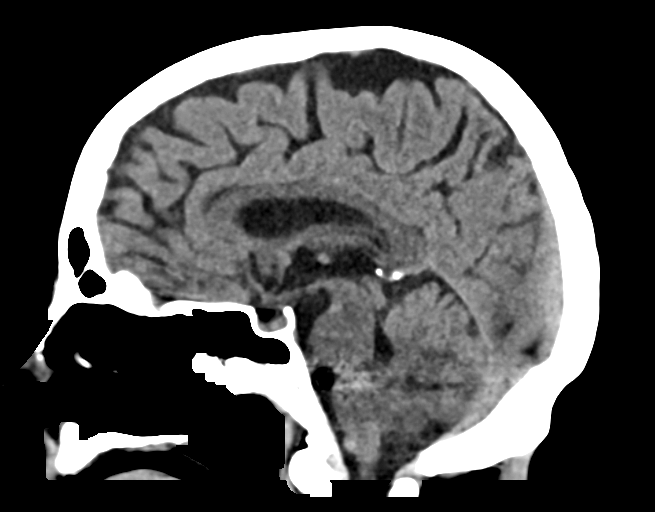
[im 31/47  brain]
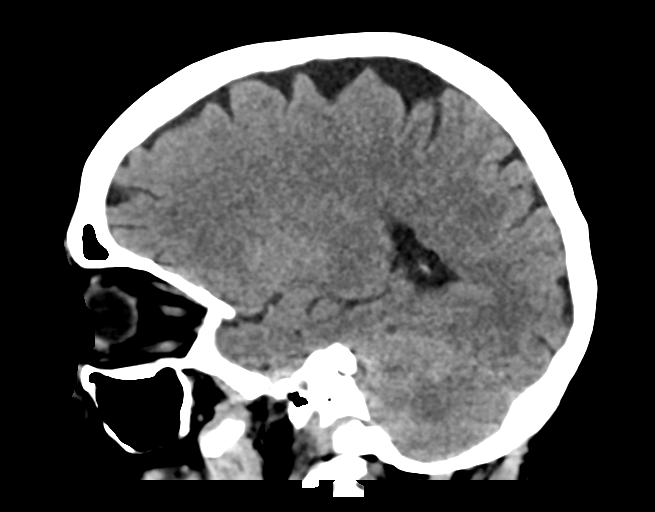

[16 of 47 positions shown; findings below may reference images not displayed]

FINDINGS: Brain: Similar appearance of the acute infarction affecting the
right cerebellar hemisphere, probably PICA distribution, with
swelling and some mass-effect upon the fourth ventricle but no
change in size of the lateral or third ventricles. No evidence of
hemorrhage.

Old right parietal cortical and subcortical infarction and old right
posteromedial temporal and occipital infarction. This same. Old
right white matter track lacunar infarction appears the same.

Vascular: There is atherosclerotic calcification of the major
vessels at the base of the brain.

Skull: Normal

Sinuses/Orbits: Clear/normal

Other: None
IMPRESSION: No significant change since yesterday. Acute infarction affecting
the right cerebellum with swelling. Mass-effect upon the fourth
ventricle but no change in size the lateral or third ventricles at
this time. No hemorrhage.

Old right hemisphere supratentorial infarctions without change.

## 2018-12-04 IMAGING — MR MR MRA HEAD W/O CM
9 of 11 series · 28 of 48 positions shown · non-contrast
Comparison: CT head without contrast 10/01/2017 at [DATE] a.m..

CLINICAL DATA: Stroke, follow-up.  Seizure.  Tachycardia.

EXAM:
MRI HEAD WITHOUT CONTRAST
MRA HEAD WITHOUT CONTRAST
TECHNIQUE: Multiplanar, multiecho pulse sequences of the brain and surrounding
structures were obtained without intravenous contrast. Angiographic
images of the head were obtained using MRA technique without
contrast.

[Series 3: DWI · axial · 3.0mm · 0.94mm/px · z∈[-76,+67]mm · 6 of 100 slices shown (1 of 2)]
[im 1/100]
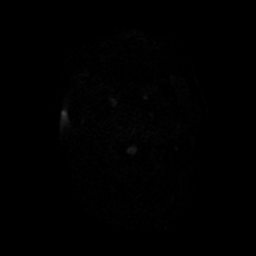
[im 20/100]
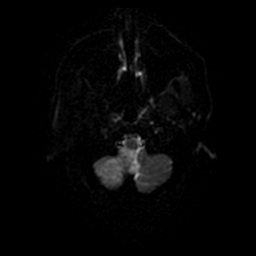
[im 40/100]
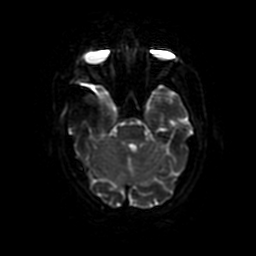
[im 60/100]
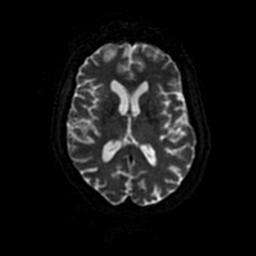
[im 80/100]
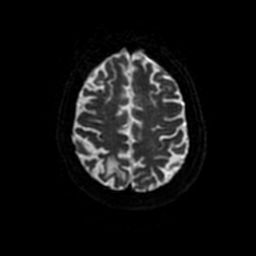
[im 100/100]
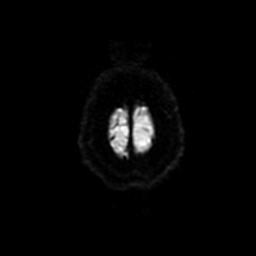

[Series 4: ax (id) 2 · axial · 1.0mm · 0.43mm/px · z∈[-42,-6]mm · 4 of 184 slices shown]
[im 1/184]
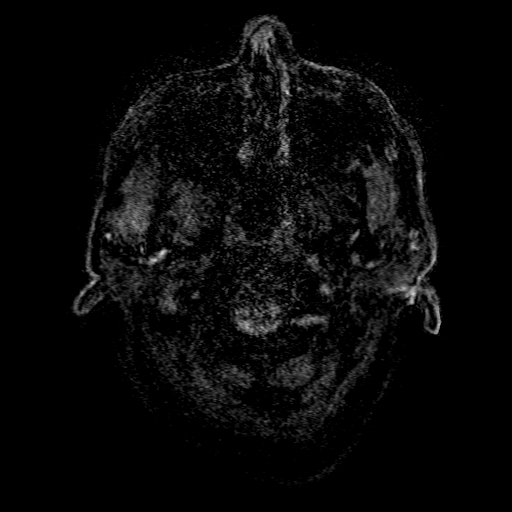
[im 31/184]
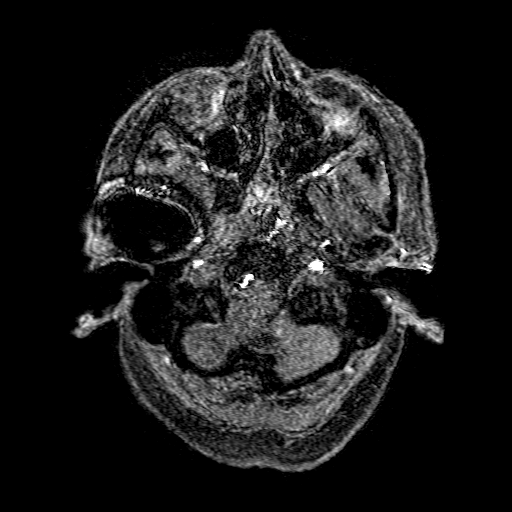
[im 62/184]
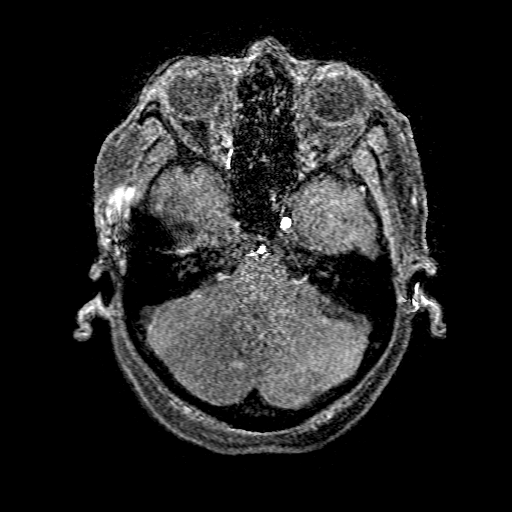
[im 77/184]
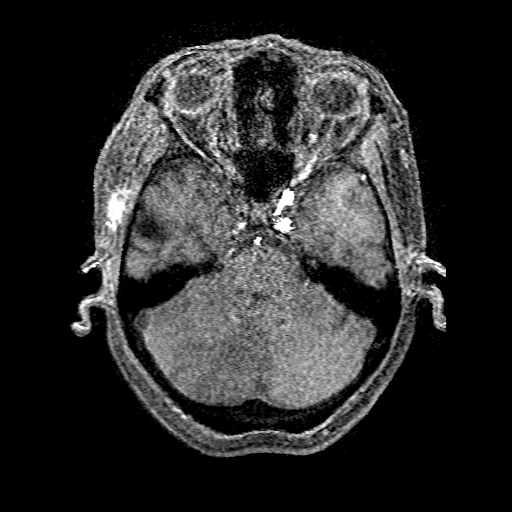

[Series 5: DWI · coronal · 4.0mm · 0.94mm/px · 5 of 72 slices shown (2 of 2)]
[im 1/72]
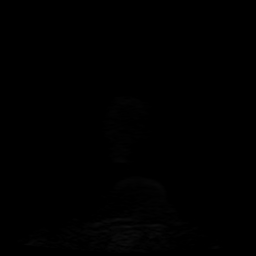
[im 18/72]
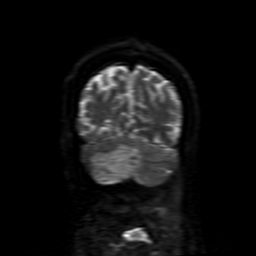
[im 36/72]
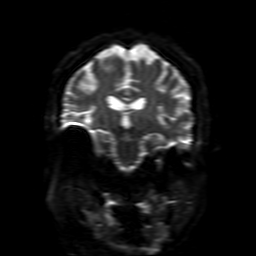
[im 54/72]
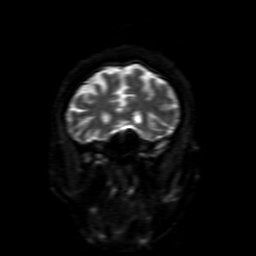
[im 72/72]
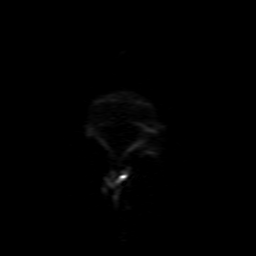

[Series 7: FLAIR · sagittal · 5.0mm · 0.47mm/px · 2 of 23 slices shown (1 of 2)]
[im 1/23]
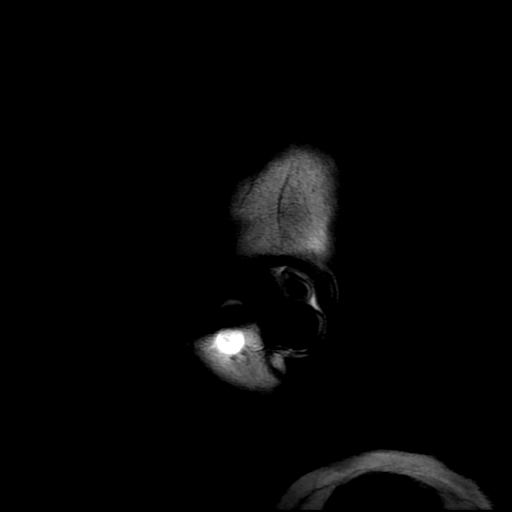
[im 23/23]
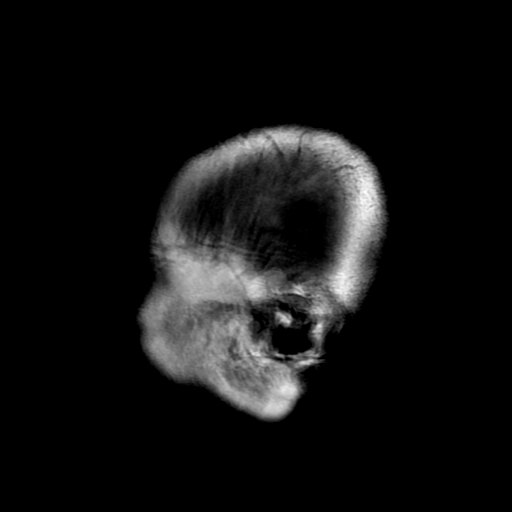

[Series 9: T2 · axial · 5.0mm · 0.47mm/px · z∈[-52,+94]mm · 2 of 26 slices shown (1 of 2)]
[im 1/26]
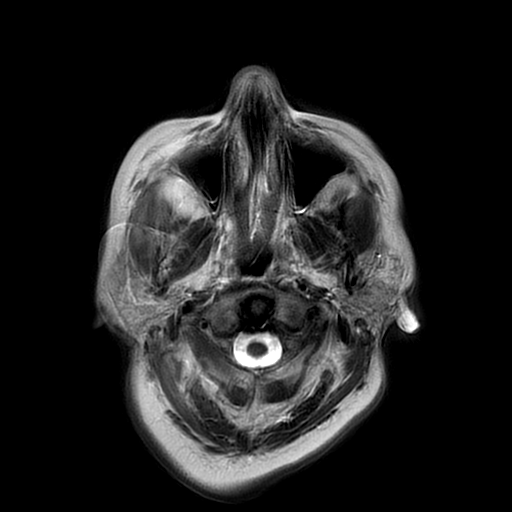
[im 26/26]
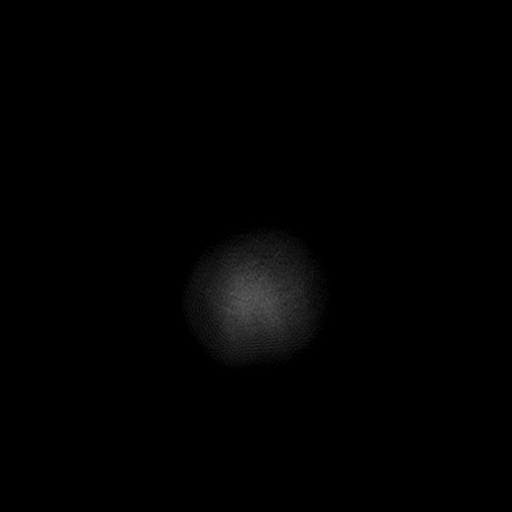

[Series 13: FLAIR · axial · 3.0mm · 0.41mm/px · z∈[-52,+94]mm · 2 of 26 slices shown (2 of 2)]
[im 1/26]
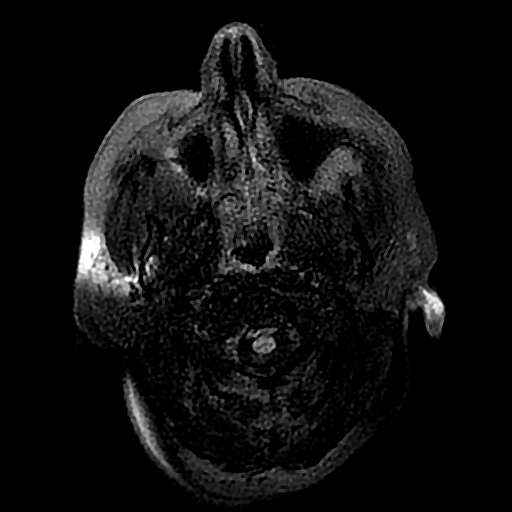
[im 26/26]
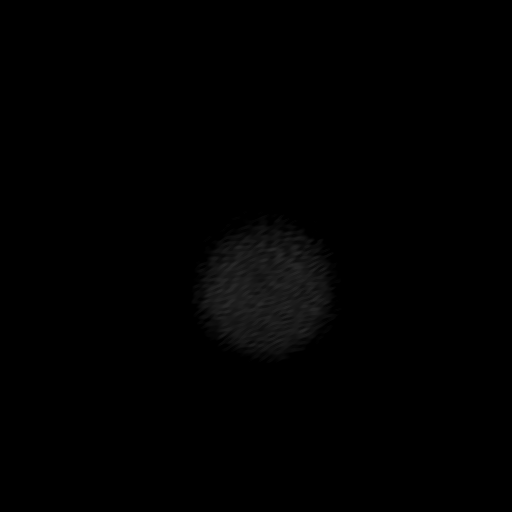

[Series 14: T2 · coronal · 5.0mm · 0.39mm/px · 2 of 27 slices shown (2 of 2)]
[im 1/27]
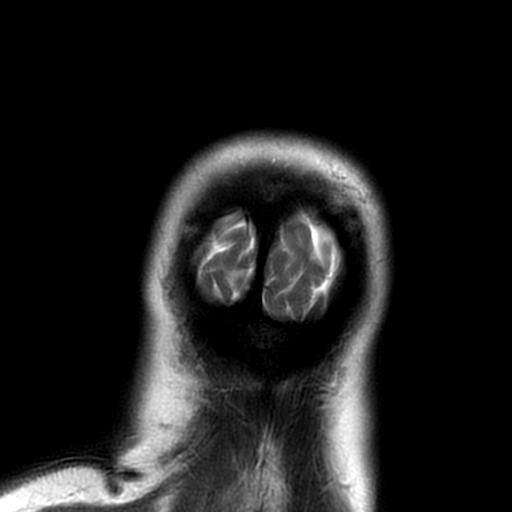
[im 27/27]
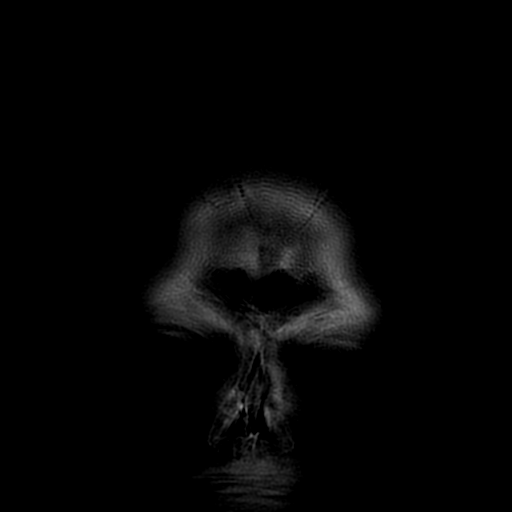

[Series 350: ADC · axial · 3.0mm · 0.94mm/px · z∈[-76,+67]mm · 3 of 50 slices shown (1 of 2)]
[im 1/50]
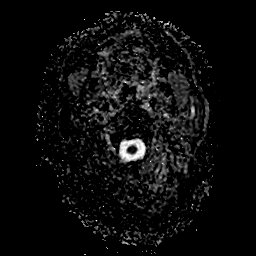
[im 25/50]
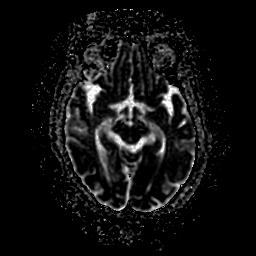
[im 50/50]
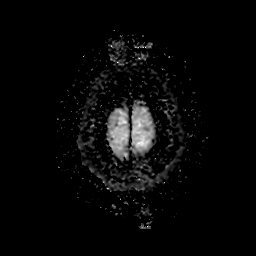

[Series 550: ADC · coronal · 4.0mm · 0.94mm/px · 2 of 36 slices shown (2 of 2)]
[im 1/36]
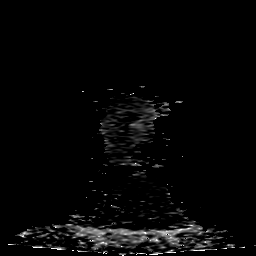
[im 36/36]
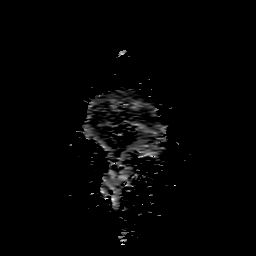

[28 of 48 positions shown; findings below may reference images not displayed]

FINDINGS: MRI HEAD FINDINGS

Brain: Diffusion-weighted images confirm an acute/ subacute infarct
involving the inferior medial right cerebellum. There is involvement
of the inferior vermis. Additional scattered foci of restricted
diffusion are present in the posterior right temporal lobe, right
occipital lobe, posterior right parietal lobe. A single focus is
present in the right middle frontal gyrus.

In addition to the areas of acute infarction, more remote
encephalomalacia is present along the inferior posterior right
temporal lobe and in the anterior right lentiform nucleus. Remote
encephalomalacia is present in the superior right parietal lobe near
the vertex and in the lateral right temporal lobe. A remote left
parietal cortical infarct is present.

Blood products are associated with the acute inferior cerebellar and
vermis infarct.

Vascular: Flow is present in the major intracranial arteries. The
cavernous right internal carotid artery and mid basilar artery are
markedly diminished in size. Please see the MRA report below.

Skull and upper cervical spine: Skullbase is within normal limits.
The craniocervical junction is within normal limits. The sagittal T1
weighted images are degraded by patient motion.

Sinuses/Orbits: The left sphenoid sinus is hypoplastic in
chronically obstructed. The remaining paranasal sinuses and the
mastoid air cells are clear. Globes and orbits are within normal
limits bilaterally.

MRA HEAD FINDINGS

There is signal loss in the cavernous right internal carotid artery
with markedly diminished signal in the high cervical right ICA.
Normal signal is present in the left ICA with atherosclerotic
irregularity in the cavernous internal carotid artery but no
significant stenosis.

Signal loss in the terminal left ICA is likely artifactual. The
anterior communicating artery is patent. Flow in the right A1
segment is likely from the left. There is marked decreased
attenuation of the distal left A2 segments. The M1 segments are
poorly seen bilaterally. This may represent stenosis. There is some
artifact at this level. There is attenuation of MCA branch vessels,
right worse than left.

The vertebral arteries are codominant. There is a fenestration at
the vertebrobasilar junction. The basilar artery is intact. A fetal
type left posterior cerebral artery is present. There is marked
attenuation of the right posterior cerebral artery beginning with
the P2 segment stenosis.
IMPRESSION: 1. Acute/subacute inferior right cerebellum and vermis infarct with
associated hemorrhage.
2. Additional scattered punctate foci of acute nonhemorrhagic
infarct involving the right PCA territory with 1 lesion involving
the right MCA territory along the right middle frontal gyrus.
3. Multiple foci of remote encephalomalacia involving the inferior
posterior right temporal lobe, lateral right temporal lobe, and high
right parietal lobe.
4. High-grade stenosis or occlusion of the cavernous right internal
carotid artery. The right A1 and likely M1 segments fill via a
patent anterior communicating artery.
5. Fenestration of the vertebrobasilar junction without significant
basilar artery stenosis.
6. Marked attenuation of PCA branch vessels, worse on the right.
Moderate proximal right PCA stenosis.
7. Fetal type left posterior cerebral artery.

## 2018-12-05 IMAGING — DX DG CHEST 1V PORT
1 series · 1 of 1 positions shown · non-contrast
Comparison: 10/02/2017

CLINICAL DATA: Dyspnea

EXAM:
PORTABLE CHEST 1 VIEW

[chest ap]
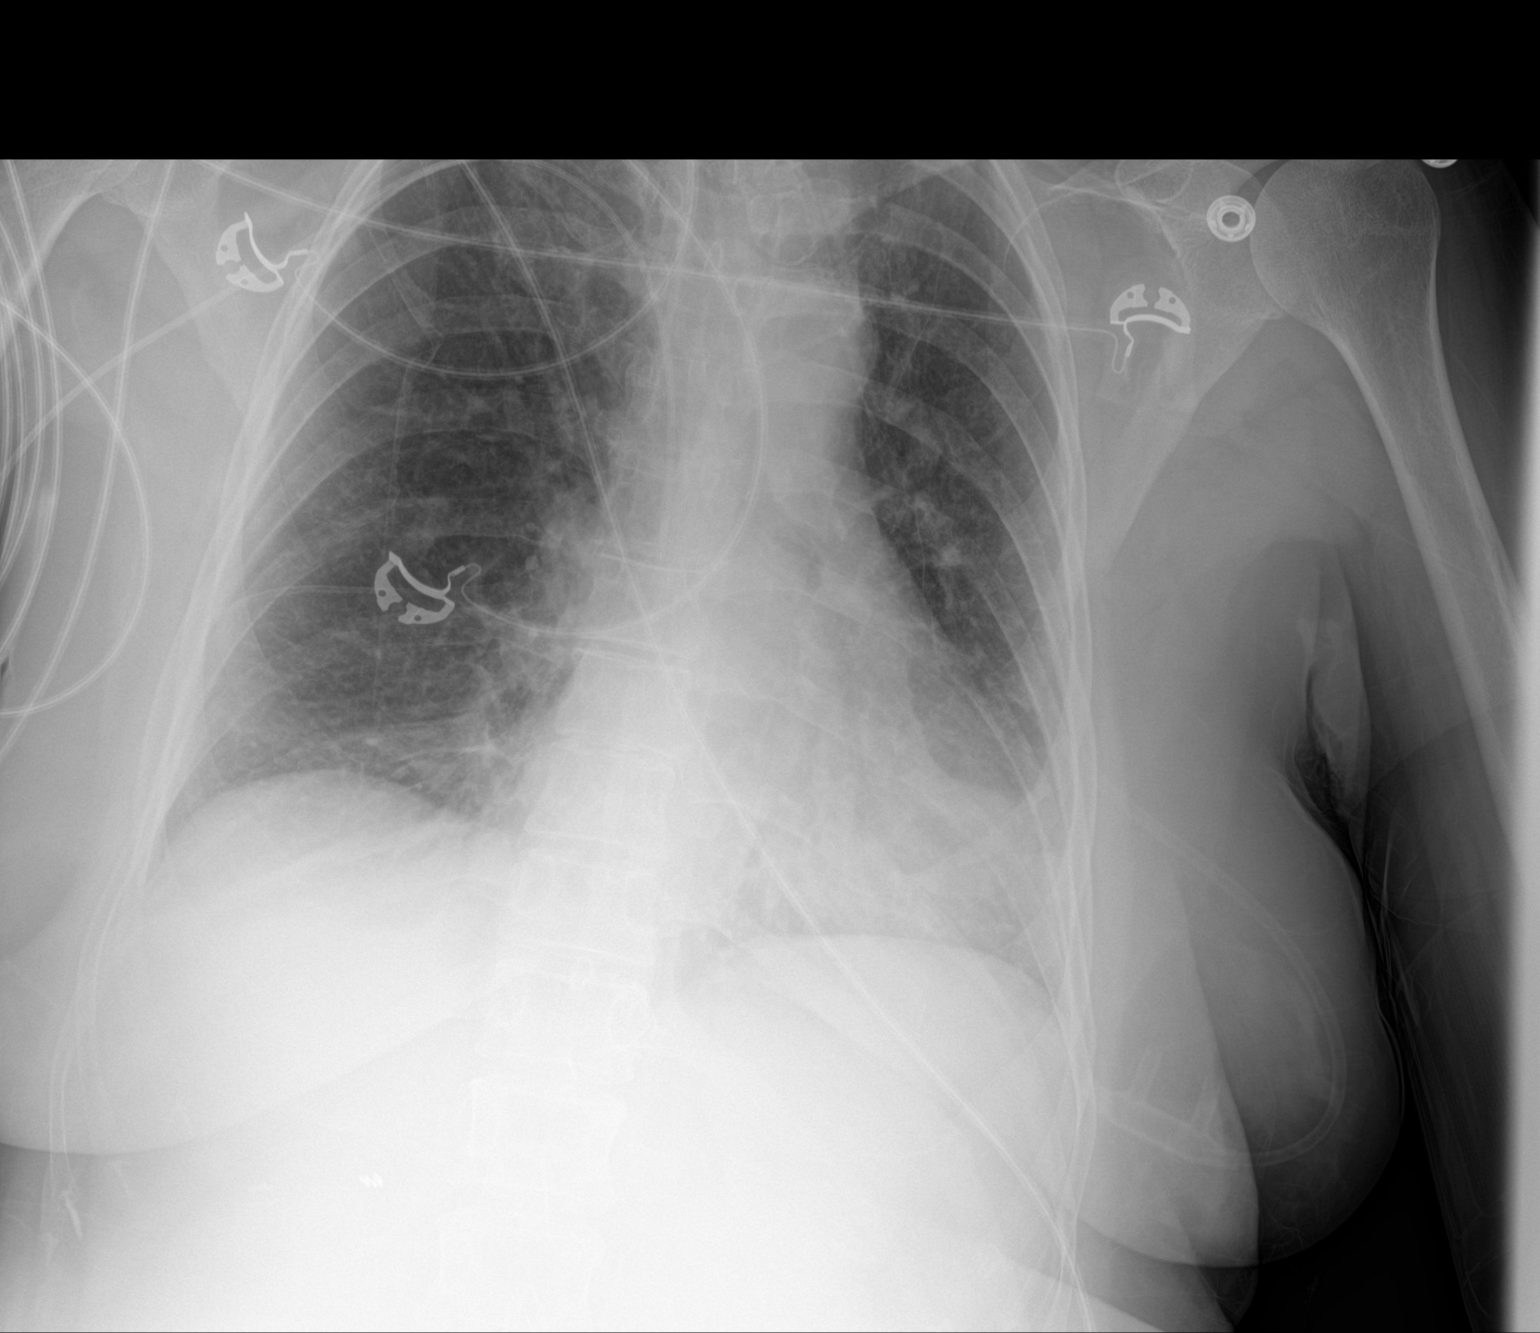

[1 of 1 positions shown; findings below may reference images not displayed]

FINDINGS: Cardiac shadow is stable. Right-sided PICC line is again seen and
stable. The lungs are well aerated bilaterally. Patchy left basilar
infiltrate is again seen and stable. No new focal abnormality is
noted. No bony abnormality is seen.
IMPRESSION: Left basilar infiltrate.

## 2018-12-09 IMAGING — DX DG CHEST 1V PORT
1 series · 1 of 1 positions shown · non-contrast
Comparison: 10/04/2017 and 10/02/2017.

CLINICAL DATA: Fever with bilateral hand swelling and gurgled
breathing.

EXAM:
PORTABLE CHEST 1 VIEW

[chest ap]
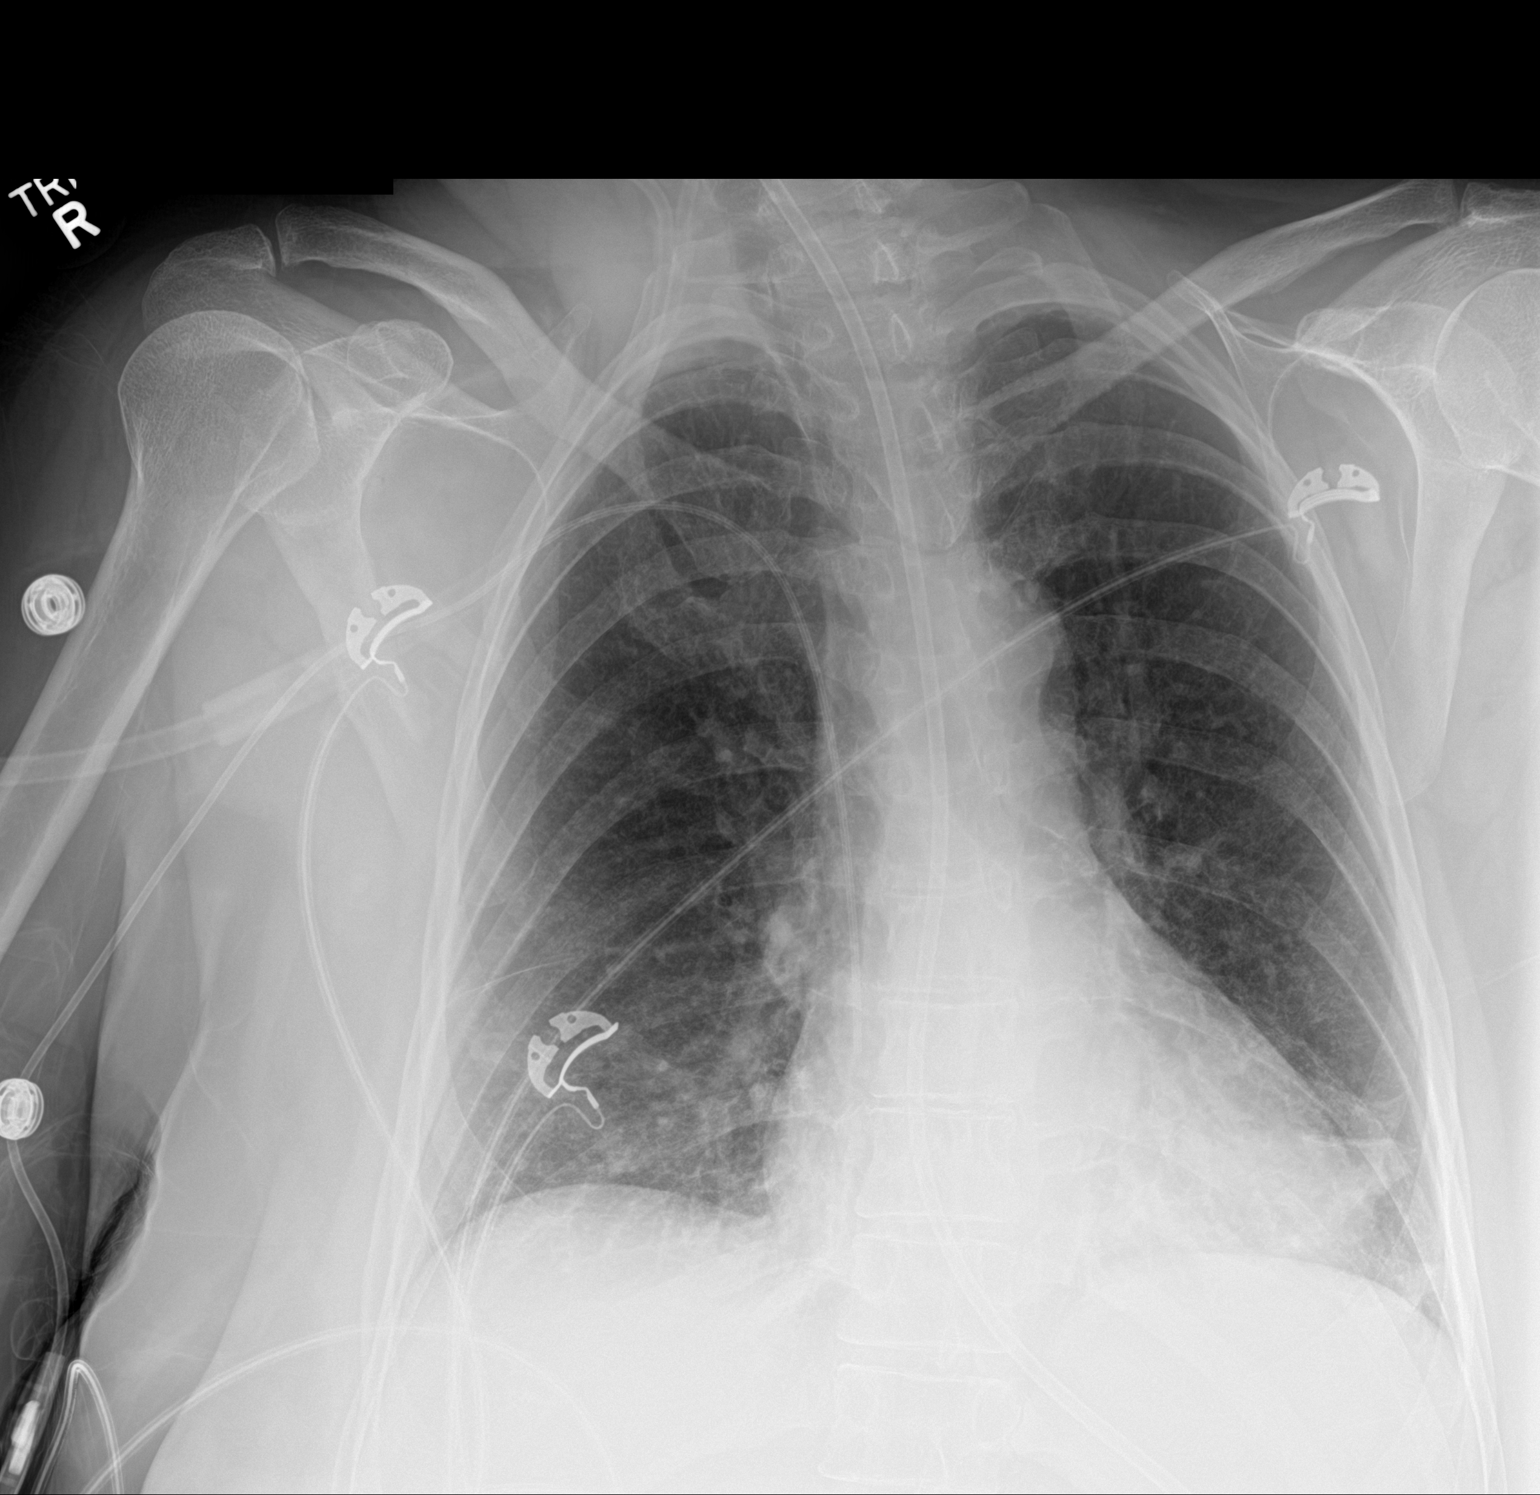

[1 of 1 positions shown; findings below may reference images not displayed]

FINDINGS: [id number] hours. Mild patient rotation to the right. A feeding tube has
been placed, projecting below the diaphragm, tip not visualized.
Right arm PICC projects to the level of the upper right atrium. The
heart size and mediastinal contours are stable. There are stable
patchy left-greater-than-right basilar airspace opacities. No edema,
significant pleural effusion or pneumothorax.
IMPRESSION: No significant change in left-greater-than-right basilar airspace
opacities, likely largely atelectasis. There is possible
superimposed inflammation in the left lower lobe.

## 2019-05-18 IMAGING — US US RENAL
1 series · 14 of 25 positions shown · non-contrast
Comparison: Fluoro spot radiograph of September 30, 2017 revealing
the right ureteral stent also CT scan the abdomen pelvis August 30, 2017

CLINICAL DATA: Decreased urinary output. The patient has a stent in
the right kidney with due to history of right-sided hydronephrosis.

EXAM:
RENAL / URINARY TRACT ULTRASOUND COMPLETE

[Series 1: us renal · 0.23mm/px · 14 of 36 slices shown]
[im 1/36]
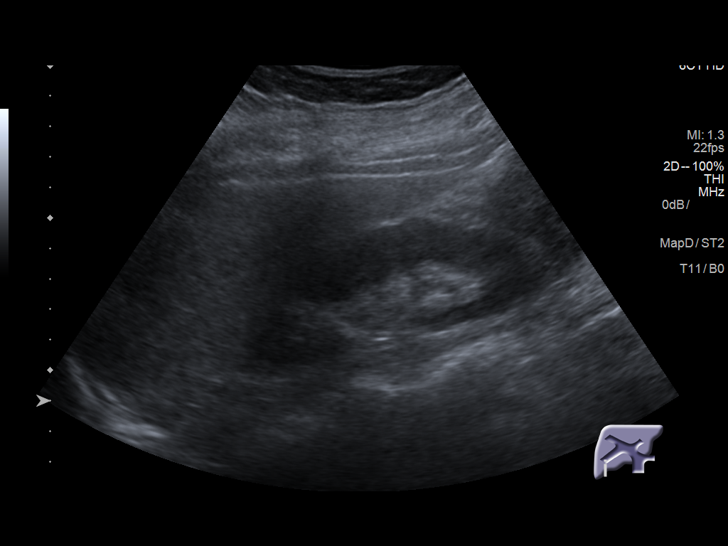
[im 3/36]
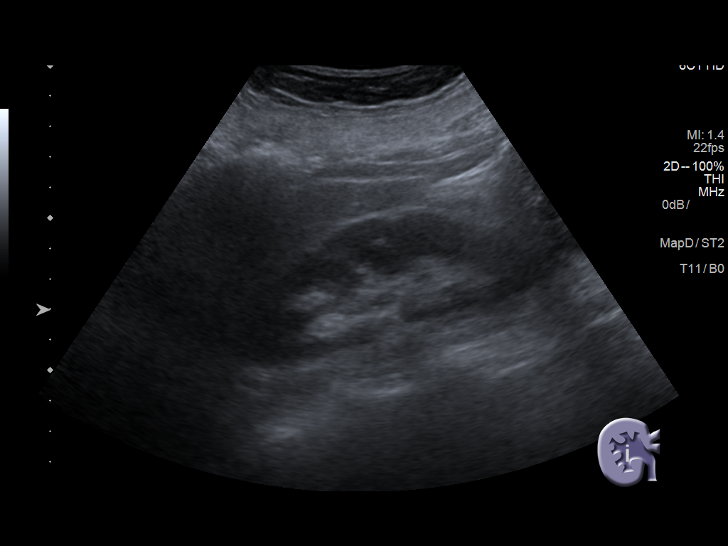
[im 6/36]
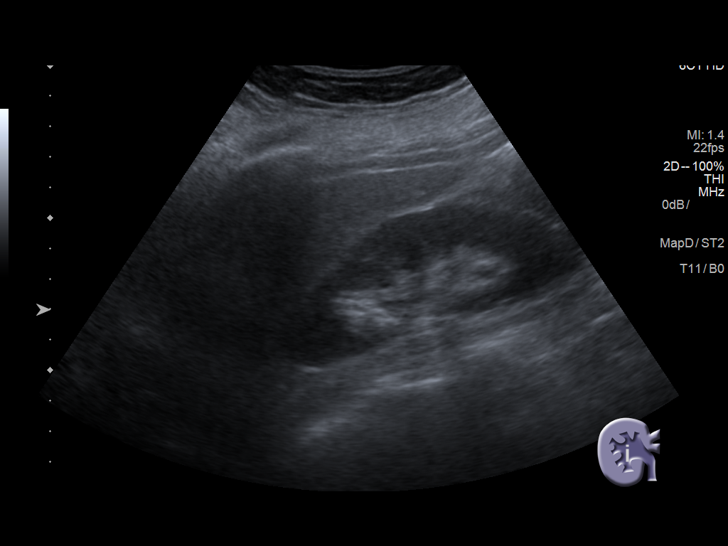
[im 9/36]
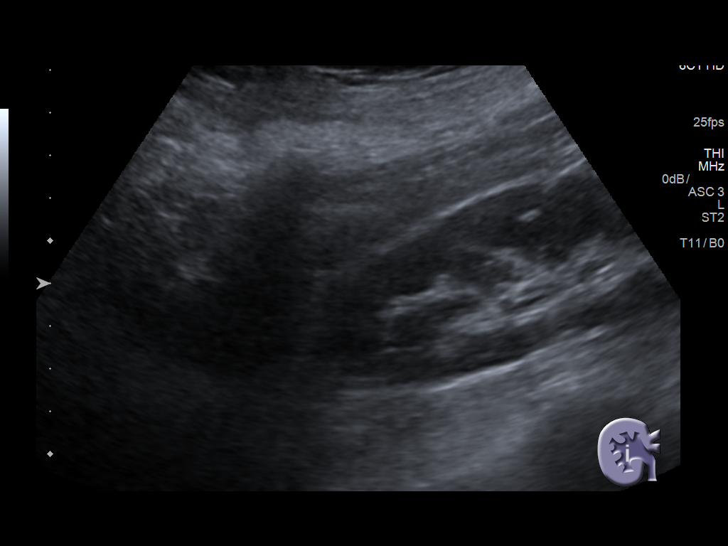
[im 12/36]
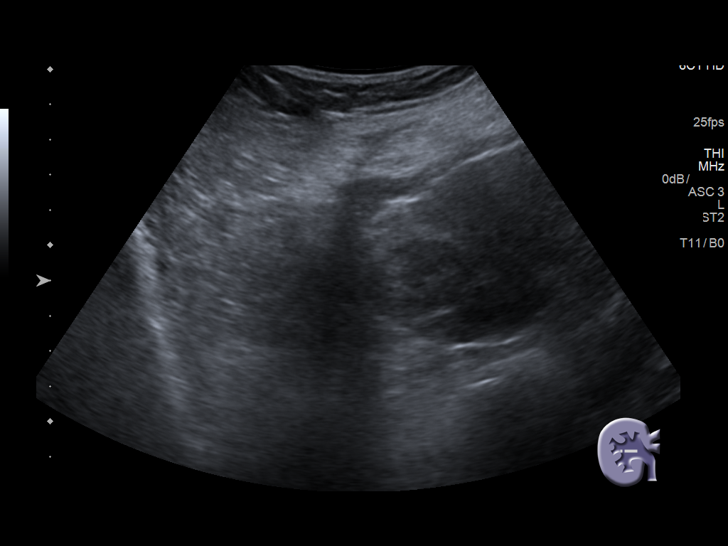
[im 14/36]
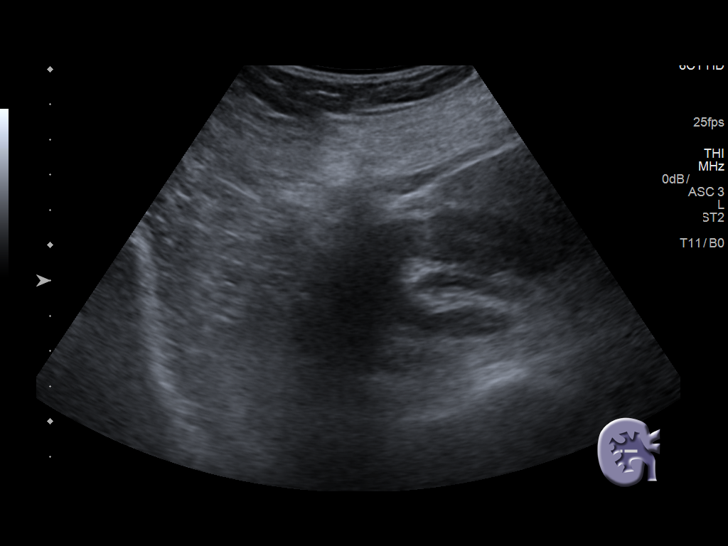
[im 17/36]
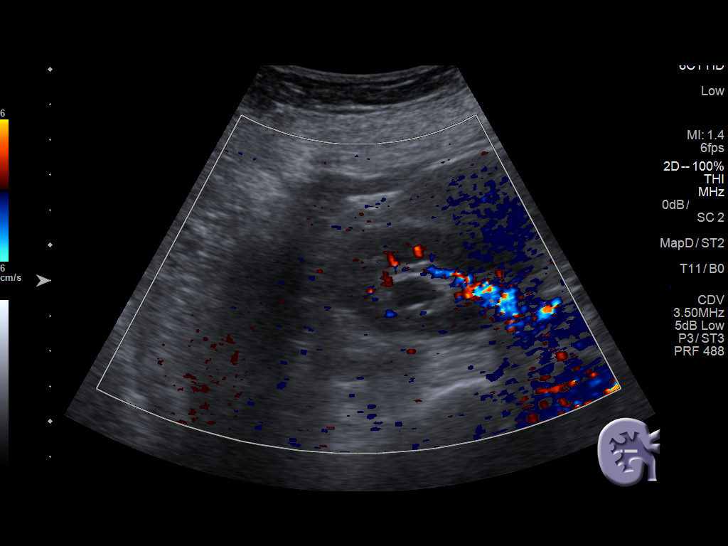
[im 19/36]
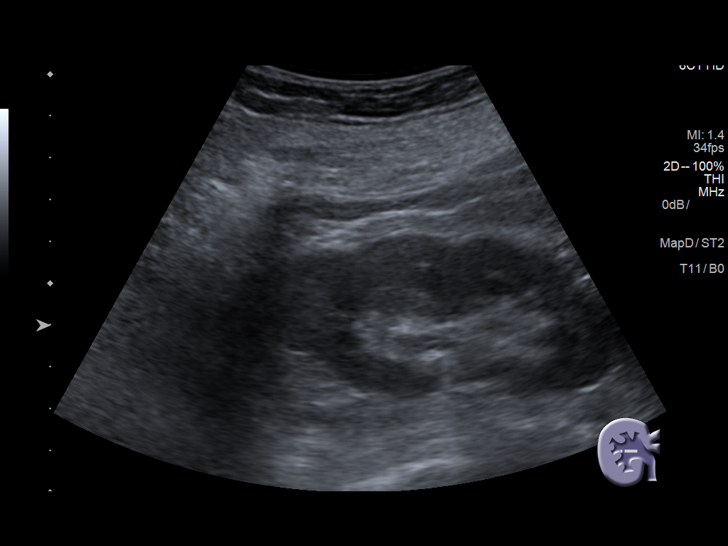
[im 22/36]
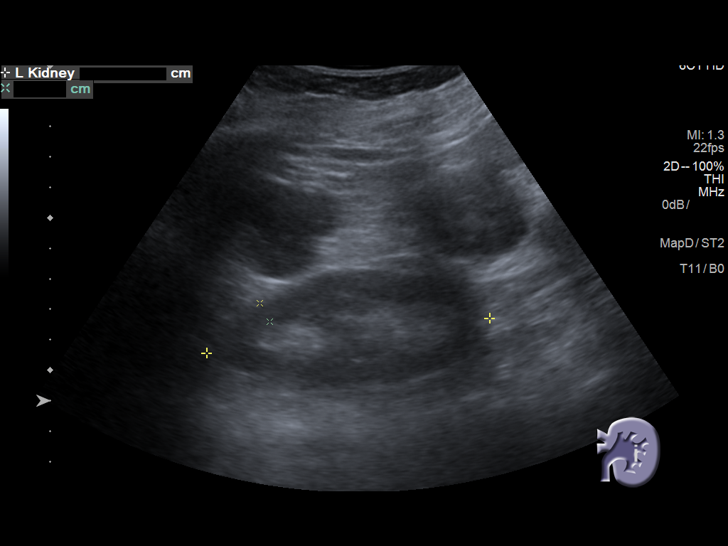
[im 24/36]
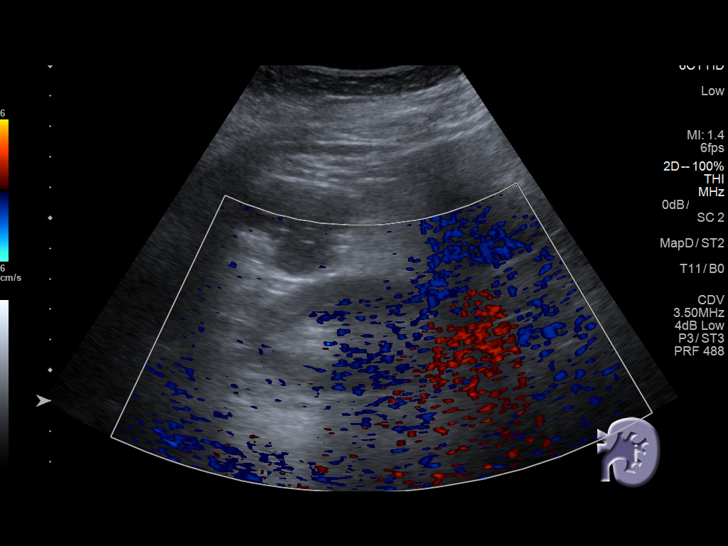
[im 27/36]
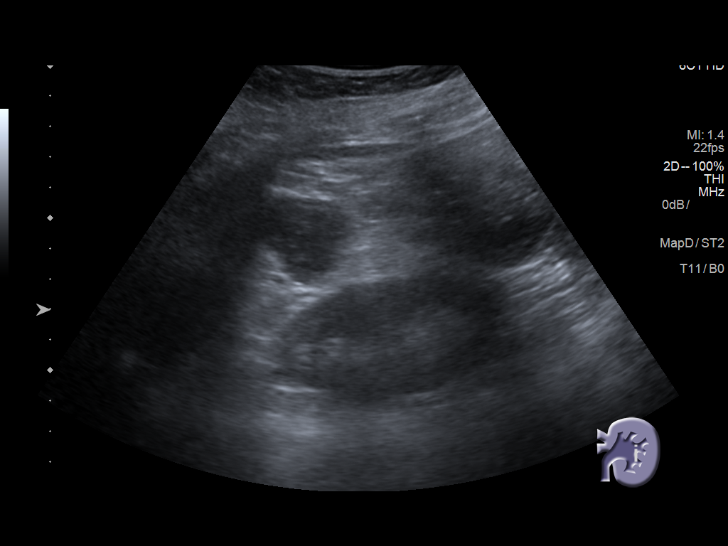
[im 30/36]
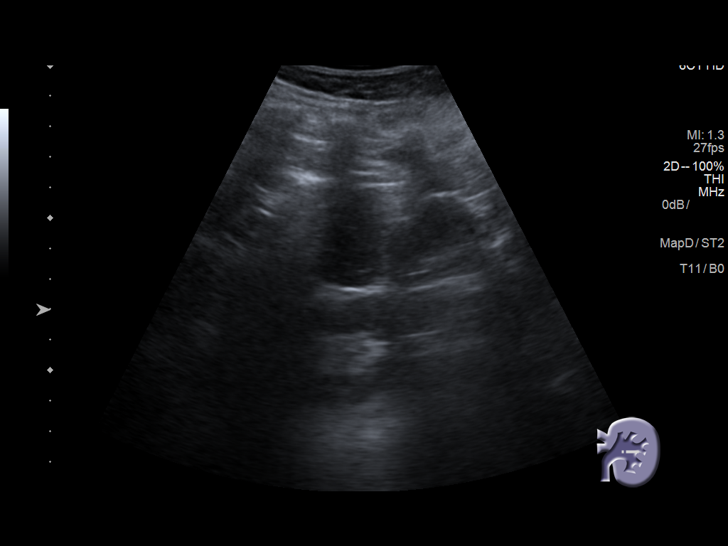
[im 33/36]
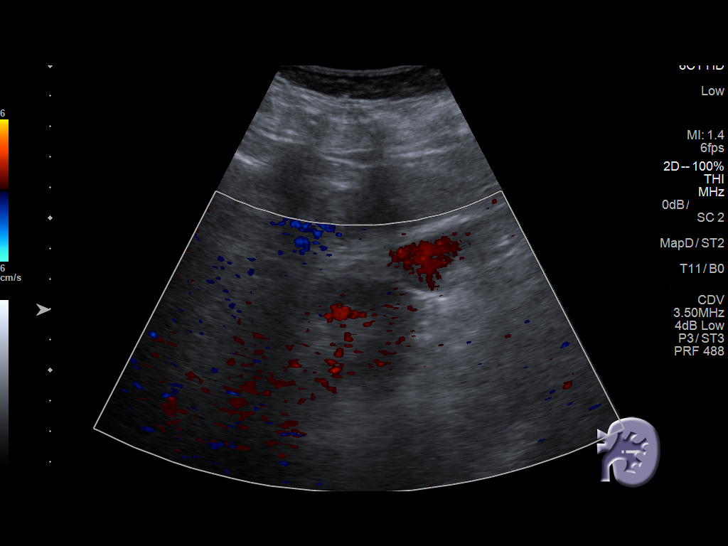
[im 36/36]
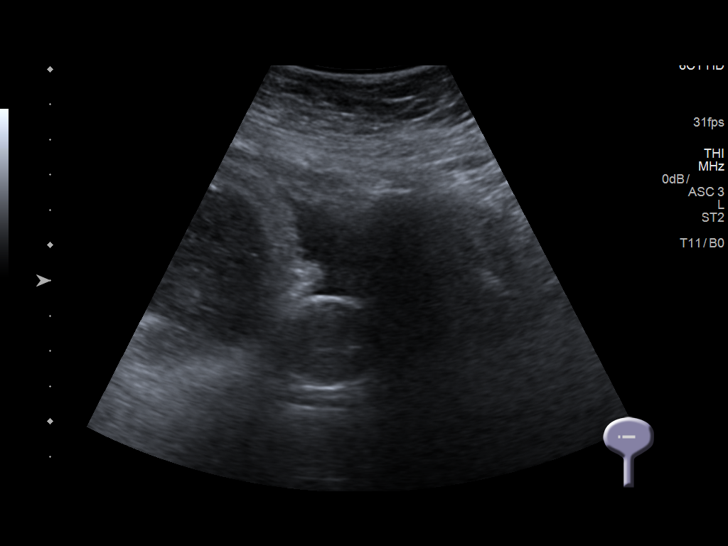

[14 of 25 positions shown; findings below may reference images not displayed]

FINDINGS: Right Kidney:

Length: 10.6 cm none. There is no hydronephrosis. The renal cortical
echotexture remains lower than that of the liver. The stent is
faintly visible.

Left Kidney:

Length: 9.4 cm. The cortical echotexture on the left is similar to
that on the right. There is no focal mass nor hydronephrosis.

Bladder:

A Foley catheter is present within the bladder. The bladder portion
of the stent is not clearly evident.
IMPRESSION: Relief of the hydronephrosis on the right due to stent placement. No
left-sided hydronephrosis. Normal renal cortical echotexture.

A Foley catheter balloon is visible within the urinary bladder.
